# Patient Record
Sex: Male | Born: 1935 | Race: White | Hispanic: No | Marital: Married | Smoking: Former smoker
Health system: Southern US, Community
[De-identification: ages and names within clinical notes are randomized; demographics above are authoritative.]

## PROBLEM LIST (undated history)

## (undated) DIAGNOSIS — T7840XA Allergy, unspecified, initial encounter: Secondary | ICD-10-CM

## (undated) DIAGNOSIS — I700 Atherosclerosis of aorta: Secondary | ICD-10-CM

## (undated) DIAGNOSIS — J90 Pleural effusion, not elsewhere classified: Secondary | ICD-10-CM

## (undated) DIAGNOSIS — Z923 Personal history of irradiation: Secondary | ICD-10-CM

## (undated) DIAGNOSIS — C801 Malignant (primary) neoplasm, unspecified: Secondary | ICD-10-CM

## (undated) DIAGNOSIS — R739 Hyperglycemia, unspecified: Secondary | ICD-10-CM

## (undated) DIAGNOSIS — T380X5A Adverse effect of glucocorticoids and synthetic analogues, initial encounter: Secondary | ICD-10-CM

## (undated) DIAGNOSIS — I313 Pericardial effusion (noninflammatory): Secondary | ICD-10-CM

## (undated) DIAGNOSIS — J449 Chronic obstructive pulmonary disease, unspecified: Secondary | ICD-10-CM

## (undated) HISTORY — DX: Allergy, unspecified, initial encounter: T78.40XA

## (undated) HISTORY — PX: CHOLECYSTECTOMY: SHX55

## (undated) HISTORY — DX: Malignant (primary) neoplasm, unspecified: C80.1

## (undated) HISTORY — DX: Chronic obstructive pulmonary disease, unspecified: J44.9

---

## 1998-12-15 ENCOUNTER — Ambulatory Visit (HOSPITAL_COMMUNITY): Admission: RE | Admit: 1998-12-15 | Discharge: 1998-12-16 | Payer: Self-pay | Admitting: Cardiology

## 2005-11-19 ENCOUNTER — Ambulatory Visit (HOSPITAL_COMMUNITY): Admission: RE | Admit: 2005-11-19 | Discharge: 2005-11-19 | Payer: Self-pay | Admitting: Family Medicine

## 2005-11-20 ENCOUNTER — Encounter (INDEPENDENT_AMBULATORY_CARE_PROVIDER_SITE_OTHER): Payer: Self-pay | Admitting: *Deleted

## 2005-11-20 ENCOUNTER — Ambulatory Visit (HOSPITAL_COMMUNITY): Admission: EM | Admit: 2005-11-20 | Discharge: 2005-11-21 | Payer: Self-pay | Admitting: Emergency Medicine

## 2007-01-20 ENCOUNTER — Encounter: Admission: RE | Admit: 2007-01-20 | Discharge: 2007-01-20 | Payer: Self-pay | Admitting: General Surgery

## 2007-01-31 ENCOUNTER — Encounter (INDEPENDENT_AMBULATORY_CARE_PROVIDER_SITE_OTHER): Payer: Self-pay | Admitting: General Surgery

## 2007-02-01 ENCOUNTER — Inpatient Hospital Stay (HOSPITAL_COMMUNITY): Admission: AD | Admit: 2007-02-01 | Discharge: 2007-02-02 | Payer: Self-pay | Admitting: General Surgery

## 2007-06-11 ENCOUNTER — Inpatient Hospital Stay (HOSPITAL_COMMUNITY): Admission: AD | Admit: 2007-06-11 | Discharge: 2007-06-13 | Payer: Self-pay | Admitting: Internal Medicine

## 2007-06-11 ENCOUNTER — Telehealth: Payer: Self-pay

## 2007-06-17 ENCOUNTER — Ambulatory Visit: Payer: Self-pay | Admitting: Internal Medicine

## 2007-07-02 ENCOUNTER — Ambulatory Visit: Payer: Self-pay | Admitting: Internal Medicine

## 2007-07-02 DIAGNOSIS — K8050 Calculus of bile duct without cholangitis or cholecystitis without obstruction: Secondary | ICD-10-CM | POA: Insufficient documentation

## 2007-07-02 LAB — CONVERTED CEMR LAB
AST: 24 units/L (ref 0–37)
Albumin: 3.6 g/dL (ref 3.5–5.2)
Alkaline Phosphatase: 200 units/L — ABNORMAL HIGH (ref 39–117)
Total Protein: 7.3 g/dL (ref 6.0–8.3)

## 2010-05-30 NOTE — H&P (Signed)
NAME:  [name], [name] NO.:  [id number]   MEDICAL RECORD NO.:  [id number]          PATIENT TYPE:  INP   LOCATION:  5153                         FACILITY:  [facility]   PHYSICIAN:  Herbie Saxon, MDDATE OF BIRTH:  07-11-1933   DATE OF ADMISSION:  06/11/2007  DATE OF DISCHARGE:                              HISTORY & PHYSICAL   PRIMARY CARE PHYSICIAN:  Ernestina Penna, M.D.   Acute power of attorney is his wife [name], phone number [phone]. He is a Do Not Resuscitate if futile.   PRESENTING COMPLAINT:  Dark urine 1 week, fever one episode 6 weeks ago,  jaundice 3 days.   HISTORY OF PRESENTING COMPLAINT:  This is a 77 year old male recently  treated for herpes simplex virus 1 and impetigo, who had noticed dark  urine for the last 1 week.  He also had an episode of fever 6 days ago,  started on Bactrim and Valtrex by the primary care physician.  noticed  increase in jaundice in the last 3 days.  No pruritus.  He has nausea.  No vomiting, no hematemesis, no melena.  He has some epigastric and  right upper quadrant abdominal pain, 4-5/10, radiating up to the chest.  No dysuria, hematuria.  No frequency of urination.  No skin rash or  joint swelling.  The patient had a CT of the abdomen that was performed  on Jun 10, 2007, which showed common bile duct stone.  GTT was elevated  at 939, AST and ALT are elevated.   PAST MEDICAL HISTORY:  Nil of note.   PAST SURGICAL HISTORY:  1. Cholecystectomy in 2007.  2. Exploratory laparotomy in January 2008, with removal of gallstone.   He generally has been previously in good health.   MEDICATION LIST:  Nil except for the recently prescribed Valtrex and  Bactrim.   ALLERGIES:  No known drug allergies.   FAMILY HISTORY:  Nil of note.   REVIEW OF SYSTEMS:  All 14 systems are reviewed.  Pertinent positive are  in history of presenting complaint.   PHYSICAL EXAMINATION:  GENERAL:  He is an elderly man not in  acute  distress.  Deeply jaundiced.  VITAL SIGNS:  Temperature is 98, pulse is 72, respiratory rate is 22,  blood pressure 140/80.  NECK: Supple.  No thyromegaly.  No carotid bruits.  No elevated JVD.  EXTREMITIES:  There is no clubbing or cyanosis.  Oropharynx and nasopharynx are clear.  He has  vesicular lesion on his lips.  No submandibular lymphadenopathy.  CHEST:  Essentially clear.  HEART:  Heart sounds S1 and S2.  Regular rate and rhythm.  No murmur.  ABDOMEN:  He has midline scar.  Minimal tenderness of the epigastrium.  No organomegaly palpable.Inguinal orifices are patent.  NEUROLOGIC:  Cranial nerves II-XII are grossly normal.  Power is 5 .   Labs done from the office showed on Jun 06, 2007, the potassium was 3.3,  chloride 99, bicarbonate 24, creatinine of 0.5, BUN is 16.  Total  cholesterol 185, triglycerides 54, HDL 48, LDL  cholesterol 126.  GTT was  939,  the AST was elevated.  Alkaline phosphatase was 152.  Sodium was  133.  Hepatitis B, A, C were negative.  AST 106, ALT was 50.  Direct  bilirubin 5.7.   ASSESSMENT:  1. Obstructive jaundice,  2. Common bile duct stone.  3. Acute on chronic hepatitis.  4. Hyponatremia.  5. Hypokalemia.  6. Recently treated for impetigo and herpes simplex virus 1.  The patient is admitted to medical surgical bed, GI evaluation by Dr.  Allyson Sabal.   ACTIVITY:  Bedrest.   IV FLUIDS:  D-5 half-normal saline at 40 mL an hour, n.p.o. for  midnight, possible ERCP in the a.m.  Oxygen 2-3 L  keeps sats greater than 90%; vital signs every shift, send  labs, CBC, CMET, chest x-ray, EKG, ammonia level, antibody, total  protein, ESR, in the morning.  SCDs for DVT prophylaxis, protonix 40mg   IV daily, Phenergan 12.5 mg IV q.8 h. p.r.n., Dilaudid 0.5 to 1 mg IV  q.4-6 h. P.r.n.,  Duonebs 1 unit dose q.6 h. p.r.n. for shortness of  breath, Senokot nightly p.r.n. for constipation.   RECOMMENDATIONS:  Review of patient's labs and address further   recommendations accordingly.  The patient's care plan, medication and  treatment plan explained to him and he verbalized understanding.   Encounter time is 45 minutes.      Herbie Saxon, MD  Electronically Signed     MIO/MEDQ  D:  06/11/2007  T:  06/12/2007  Job:  [id number]

## 2010-05-30 NOTE — Op Note (Signed)
NAME:  [name], [name]                  ACCOUNT NO.:  [id number]   MEDICAL RECORD NO.:  [id number]          PATIENT TYPE:  AMB   LOCATION:  DAY                          FACILITY:  [facility]   PHYSICIAN:  Timothy E. Earlene Plater, M.D. DATE OF BIRTH:  [birth year]   DATE OF PROCEDURE:  01/31/2007  DATE OF DISCHARGE:                               OPERATIVE REPORT   PREOPERATIVE DIAGNOSES:  1. Persistent umbilical fistula.  2. Question abdominal abscess.   POSTOPERATIVE DIAGNOSIS:  Gallstone-infested omental abscess with  umbilical fistula.   OPERATIVE PROCEDURES:  1. Exploratory laparotomy.  2. Debridement of fistula of umbilicus.  3. Excision of omental mass.   SURGEON:  Timothy E. Earlene Plater, M.D.   ANESTHESIA:  General.   [name] is 26, otherwise healthy.  He had an emergency laparoscopic  cholecystectomy in November 2007 and since that time has had  periumbilical pain and began draining an umbilical fistula 1 year ago.  This been treated variously and I saw the patient last month in  consultation and proceeded to work this up.  A CT scan was done showing  an infectious mass beneath the umbilicus on the fascia in the abdominal  cavity against the fascia, and so I have explained this to him and his  wife in detail and they have elected to proceed with surgery at this  time, which I have explained as best I can, including the very real  likelihood of loss of the umbilicus.  The patient was seen, identified,  and the permit signed.   He was taken to the operating room and placed supine.  General  endotracheal anesthesia was administered.  The abdomen was opened in the  lower midline from the umbilicus down.  I used magnification throughout.  There was a chronic granulomatous tissue around the umbilicus but not in  the fascia.  I opened the fascia and entered the abdominal cavity.  It  was clean without evidence of any problem whatsoever.  However, by  palpation there was a mass adherent to the  upper left periumbilical  area, so I extended the incision around the umbilical indentation to the  superior abdomen.  Total length was about 6-7 inches.  In and around the  umbilicus was a granulomatous fistula tract through the depth of the  umbilical skin through the fascia and into this omental mass.  I  explored carefully.  I could find no evidence that there was bowel  within the mass and so I bluntly dissected the omental mass off the  anterior abdominal wall.  There were several gallstones seen and  removed.  The omental mass was carefully inspected and again no bowel  was involved, so I excised the omental mass and removed it from the  abdomen.  I cut it on the back table.  There were a number of gallstones  within a chronic granulomatous cavity.  I called and talked to the  pathologist and explained in detail what the specimen was.  The second  specimen was a cup of loose gallstones, and third specimen was  surrounding inflammatory  tissue.  The abdomen was irrigated.  It was  clear.  I could see nor palpate no other abnormalities.  I saw no other  evidence for infectious process.  A close the fascia with 1-0 0 PDS and  interrupted 0 Prolene sutures.  The periumbilical skin was debrided and  the incised skin was closed with staples, and around the umbilicus the  skin was closed with nylon sutures.  However, the umbilicus could not be  recreated and I left the depths of the wound open as it certainly had  been infected previously.  All counts were correct.  He had tolerated it  well.  A culture report done at the beginning of the case returned as,  No organisms, many white cells.  The patient will be observed  overnight and followed as an outpatient.      Timothy E. Earlene Plater, M.D.  Electronically Signed     TED/MEDQ  D:  01/31/2007  T:  01/31/2007  Job:  [id number]   cc:   Ardeth Sportsman, MD  [address] [place] [address]

## 2010-05-30 NOTE — Op Note (Signed)
NAME:  [name], [name] NO.:  [id number]   MEDICAL RECORD NO.:  [id number]          PATIENT TYPE:  INP   LOCATION:  5153                         FACILITY:  [facility]   PHYSICIAN:  Wilhemina Bonito. Marina Goodell, MD      DATE OF BIRTH:  1933/05/20   DATE OF PROCEDURE:  06/12/2007  DATE OF DISCHARGE:                               OPERATIVE REPORT   PROCEDURE:  Endoscopic retrograde cholangiopancreatography with biliary  sphincterotomy and common bile duct stone extraction.   INDICATION:  Obstructive jaundice.   HISTORY:  This is a 77 year old white male with a history of  laparoscopic cholecystectomy in November of 2007, for acute  cholecystitis.  At the time of the procedure, the intraoperative  cholangiogram was negative.  Gallstones were inadvertently dropped into  the abdominal cavity, though felt to be retrieved.  The patient  subsequently developed an omental abscess infested with gallstone and  fistulizing to the umbilicus.  This was dealt with surgically.  He now  presents with a 2-week history of vague abdominal discomfort and  jaundice.   LABORATORY DATA:  Laboratories reveal elevated alkaline phosphatase and  bilirubin.  CT scan, done at [facility], revealed biliary  dilation and a piston-shaped defect felt to be in the distal common  duct.  Also, large periampullary diverticulum.  The patient is referred  for ERCP.  The nature of the procedure as well as the risks, benefits,  and alternatives have been reviewed.  He understood and agreed to  proceed.   PHYSICAL EXAMINATION:  GENERAL:  Jaundiced, otherwise well-appearing  male in no acute distress.  He is alert and oriented.  VITAL SIGNS:  Stable.  HEENT:  Sclerae are icteric.  The oral mucosa reveals crusted fever  blisters on the lower lip.  LUNGS:  Clear.  HEART:  Regular.  ABDOMEN:  Soft with minimal tenderness in the right upper quadrant to  palpation.  Good bowel sounds heard.  EXTREMITIES:  Without  edema.   DESCRIPTION OF PROCEDURE:  After informed consent was obtained, the  patient was sedated with 100 mcg of fentanyl and 10 mg of Versed over  the course of the procedure.  Glucagon was given in an increment of 0.5  mg IV as a duodenal relaxant.  Preoperative Unasyn provided.  The Pentax  side-viewing endoscope was then passed blindly into the esophagus.  Stomach and duodenal bulb were unremarkable.  Postbulbar duodenum was  remarkable for a large periampullary diverticulum.  1. X-ray findings, scout radiograph of the abdomen with the endoscope      in position revealed cholecystectomy clips.  2. Initial injection of contrast via the major ampulla yielded partial      pancreatogram.  Pancreas was purposely not overfilled.  No      stricture or dilation.  The cannula was then redirected with deep      cannulation into the common bile duct.  Complete filling of the      bile duct revealed dilated biliary tree post cholecystectomy.      There was a 7 x 15 mm piston-shaped  defect, which was mobile in the      midportion of the duct.  This was consistent with stone.  3. Therapy with the hydrophilic guidewire in the proximal biliary      tree.  A large biliary sphincterotomy was made with cutting via the      ERBE system.  Cutting was in the 12 o'clock orientation.  The      cutting catheter was then exchanged for a 12/15 mm sequential      balloon.  The stone was easily delivered into the duodenum with the      balloon.  Post extraction occlusion cholangiogram demonstrated no      residual filling defects.  Drainage was excellent.   IMPRESSION:  Choledocholithiasis, status post endoscopic retrograde  cholangiopancreatography with biliary sphincterotomy and stone  extraction.   RECOMMENDATIONS:  1. Standard postprocedure observation.  2. Continue antibiotics overnight.  3. Anticipated discharge home in the morning.      Wilhemina Bonito. Marina Goodell, MD  Electronically Signed     JNP/MEDQ   D:  06/12/2007  T:  06/13/2007  Job:  [id number]   cc:   Ernestina Penna, M.D.  Fax: [phone]   Ardeth Sportsman, MD  [address] [place] [address]   Adolph Pollack, M.D.  [address]  [place]  [place] [address]

## 2010-06-02 NOTE — Op Note (Signed)
NAMEGILDO, CRISCO NO.:  [id number]   MEDICAL RECORD NO.:  [id number]          PATIENT TYPE:  INP   LOCATION:  0098                         FACILITY:  [facility]   PHYSICIAN:  Ardeth Sportsman, MD     DATE OF BIRTH:  1934/01/14   DATE OF PROCEDURE:  11/20/2005  DATE OF DISCHARGE:  11/21/2005                                 OPERATIVE REPORT   PRIMARY CARE PHYSICIAN:  Ernestina Penna, M.D.   SURGEON:  Ardeth Sportsman, MD   PREOPERATIVE DIAGNOSIS:  Acute cholecystitis.   POSTOPERATIVE DIAGNOSIS:  Acute cholecystitis.   PROCEDURE:  Laparoscopic cholecystectomy.   ANESTHESIA:  1. General anesthesia.  2. Local anesthetic and a field block around all port sites.   SPECIMENS:  Gallbladder.   DRAINS:  None.   ESTIMATED BLOOD LOSS:  50 mL   COMPLICATIONS:  None apparent.   INDICATIONS:  Mr. Bury is a 77 year old gentleman who had a 72-hour history  of postprandial abdominal pain with physical exam and CT scan findings  considered for acute cholecystitis.  A recommendation was made for a  laparoscopic cholecystectomy with intraoperative cholangiogram.  The anatomy  and physiology of gallbladder, pancreatic function, and hepatobiliary  function was discussed.  The pathophysiology, cholecystitis, gallstone  pancreatitis, and other risks were discussed.  Recommendation was made for  laparoscopic cholecystectomy.  Risks such as stroke, heart attack, deep  thrombosis pulmonary embolism, and death were discussed.  Risks such as  bleeding, need for transfusion, wound infection, abscess, injury to other  organs were discussed as well.  Because of his persistent pain, and his  elevated white count from last night, and an abnormal CT scan; I recommended  that this be done now and not on an elective basis.  He and his wife agreed  to proceed.   OPERATIVE FINDINGS:  He had a swollen gallbladder consistent with acute  cholecystitis.  He had some mild effusions from his  omentum to the liver and  gallbladder, but no other abnormal pathology.   DESCRIPTION OF PROCEDURE:  An informed consent was confirmed.  The patient  underwent general anesthesia without difficulty.  He was already on IV  antibiotics.  His abdomen was scrubbed and draped in a sterile fashion.  Entry was gained to the abdomen by using a Veress technique.  A 5-mm  incision was made infraumbilically and a towel clamp was used to grab the  fascia for countertraction as the Veress passed easily.  Capnoperitoneum to  15 mmHg provided good abdominal insufflation.  A 5-mm port was placed.  Camera inspection revealed no intra-abdominal injury and findings noted  above.  Under direct visualization a 10-mm port was placed in the subxiphoid  and 5-mm ports times two were placed in the right midabdomen.   The gallbladder wall was very thick, but the fundus could be just slightly  grasped.  The gallbladder was elevated cephalad.  In trying to grab the  infundibulum, the gallbladder wall shredded; and, there was spillage of bile  and some stones. The bile was aspirated.  Stones were  picked up using a  stone grasper and removed.  Some irrigation was done to help clear it out.  The anteromedial and posterolateral peritoneal attachments to the  gallbladder were freed up.  Circumferential dissection was done on the lower  half of the gallbladder.  There was some pulsatile structure going from the  gallbladder down to the porta hepatis consistent with a cystic artery.  There was a lot of fat and swelling around this and it was skeletonized  down. I found two branches.  One clip on the gallbladder side and 2 clips  located proximally were placed on each structure, and these were transected.  Further skeletonization was done to note one structure going from the  infundibulum of the gallbladder down to the porta hepatitis consistent with  the cystic duct.  Two clamps proximally were made.  The cystic duct was   partially transected.  A 5 French catheter was placed in the abdomen,  flushed, and then placed into the cystic duct with a gentle Olsen clamp.   A cholangiogram was performed using a dilated contrast with a nice good run.  The contrast filled up into the right and left intrahepatic ducts and flowed  down into the duodenum with no evidence of any obstruction or stones.  This  was consistent with a normal cholangiogram.  The cholangiocatheter was  removed.   Three clips on the cystic duct were made just slightly proximal to the  cystic duct transection and to the cystic duct cut and the cystic duct cut  was completely transected.  The gallbladder was freed off from its remaining  attachments to the liver.  There was some oozing on the liver side, but this  was able to be controlled with careful focus cautery.  The gallbladder was  placed in an EndoCatch bag; given the contamination and thickened wall, and  removed at the subxiphoid port with some gentle dilation.  A #0 Vicryl was  passed around the subxiphoid defect using a laparoscopic suture passer.   The 10-mm port was removed.  Copious irrigation of 2 liters was done until  there was nice clear return.  The clips were inspected and were attached to  the cystic artery and duct stumps.  There is no leak of bile or blood.  The  liver bed was, again, reinspected and it was nice and quite with no evidence  of any bleeding.  The ports were removed under direct visualization. The  capnoperitoneum was then evacuated.  The subxiphoid port fascial stitch was  tied down.  The ports were closed using 4-0 Monocryl stitch and sterile  dressings were applied.  The patient was extubated and sent to the recovery  room in stable condition.   I explained the operative findings to the patient's wife.  The expected  postoperative recovery was discussed in detail. She expressed understanding  and appreciation.      Ardeth Sportsman, MD  Electronically  Signed     SCG/MEDQ  D:  11/20/2005  T:  11/21/2005  Job:  [id number]

## 2010-06-02 NOTE — Consult Note (Signed)
NAME:  TRAYLEN, ECKELS NO.:  [id number]   MEDICAL RECORD NO.:  [id number]          PATIENT TYPE:  INP   LOCATION:  0103                         FACILITY:  [facility]   PHYSICIAN:  Ardeth Sportsman, MD     DATE OF BIRTH:  [birth year]   DATE OF CONSULTATION:  DATE OF DISCHARGE:                                   CONSULTATION   REQUESTING PHYSICIAN:  Ernestina Penna, M.D.   CHIEF COMPLAINT:  Abdominal pain, probable acute cholecystitis.   HISTORY OF PRESENT ILLNESS:  Mr. Viegas is a 77 year old male who is  otherwise very physically active with no significant past medical history  who has had for the past couple of months some mild anorexia and nausea.  Three days ago he had an episode of abdominal pain that woke him up in the  middle of the night.  He took some Pepto-Bismol and eventually the nausea  and pain faded away.  However, yesterday in the afternoon after having 3  bowels of vegetable soup, he had severe abdominal pain described as in the  middle, crampy.  It did not appear to radiate at all.  He got so nauseated  he actually vomited.  He was evaluated by his primary care physician who had  some testing done at [facility] with findings concerning for  cholecystitis.  He went home, and he has avoided any solid food.  He has  tolerated liquids and just water primarily.  The pain has gone down somewhat  but it has not completely gone away.  Based on his concern, Dr. Christell Constant called  Sandria Bales. Ezzard Standing, M.D. of our group for evaluation.  I was available and so I  am seeing the patient for the group.   The patient any history of heartburn or reflux.  He normally has about 2  bowel movements a day which is normal for him.  No significant sick contacts  or travel history.  He has had a negative colonoscopy about 5 years ago.  No  history of any other GI complaints.   PAST MEDICAL HISTORY:  He had a negative cardiac catheterization done in  2002.  He did not seem to  have any particular symptoms, and it was pretty  good overall.   PAST SURGICAL HISTORY:  Negative.   MEDICATIONS:  He takes aspirin intermittently, maybe once or twice a week.  The last one he took was about 3 days ago.  It is 81 mg a day.   ALLERGIES:  NO KNOWN DRUG ALLERGIES.   SOCIAL HISTORY:  No tobacco, alcohol, or other drug use.  He is here today  with his wife.   FAMILY HISTORY:  He can not recall any significant cardiopulmonary or GI  history in his family.   PHYSICAL EXAMINATION:  VITAL SIGNS:  Temperature is 97.1, pulse 85 and  regular, respirations 18, blood pressure 117/73.  His weight is about 82 kg.  GENERAL:  He is alert and oriented x4 and not in any acute distress at this  time.  PSYCHIATRIC:  He is  pleasant and interactive with no evidence of dementia,  delerium, psychosis, or paranoia.  NEUROLOGIC:  Cranial nerves II-XII are intact.  Hand grip 5/5 equal and  symmetrical.  No resting or intention tremor.  Appears to have a normal  gait.  EYES:  Show pupils equal, round, reactive to light.  Extraocular movements  are intact.  His sclerae are not icteric or injected.  HEENT:  He is normocephalic.  No facial asymmetry.  Mucous membranes are dry  but his naso & oropharynx are clear.  NECK:  Supple without any adenopathy.  Trachea is midline.  HEART:  Regular rate and rhythm.  No murmurs, clicks, rubs.  No carotid  bruits.  Normal radial and dorsalis pedis pulses.  LUNGS:  Clear to auscultation bilaterally.  No wheezes, rales, or rhonchi.  No pain on vigorous sternal percussion.  ABDOMEN:  He does have tenderness in his right upper quadrant.  I do not  know if I would call is a class A Murphy sign but he is definitely tender  and does splint a little bit to deep palpation.  The rest of the abdomen is  soft with no diffuse peritonitis, no umbilical hernia.  GENITAL:  Normal external male genitalia with testes descended bilaterally.  No inguinal hernias.  RECTAL:   Deferred.  EXTREMITIES:  No clubbing, cyanosis, or edema.  MUSCULOSKELETAL:  Full range of motion shoulder, elbows, wrists, both hips,  knees, ankles.  LYMPH:  No head, neck, axilla or groin lymphadenopathy.   STUDIES:  His white blood count was 12.6 last night.  His EKG was negative  at the office.  No ST-T changes.  His chemistries are all normal range  including normal bilirubin and liver function tests.  Amylase and lipase are  pending.  He did have a CT scan done which I did review with Dr. Jean Rosenthal  here at the ER in the radiology department here, which does show gallbladder  wall thickening with edema and an obvious impacted stone concerning for  cholecystitis.  He does not have any free air or free fluid.   ASSESSMENT/PLAN:  A 76 year old gentleman with classic story of acute  cholecystitis with persistent pain and an elevated white count and CT scan  findings also concerning for this as well.   The anatomy and physiology of hepatobiliary and pancreatic function was  discussed.  Pathophysiology of cholecystitis was explained and  recommendation was made for laparoscopic cholecystectomy with cholangiogram.  Risk such as stroke, heart attack, DVT, pulmonary embolism, and death were  discussed.  Risks such as bleeding, need for transfusion, wound infection,  abscess, injury to other organs, bile duct injury, need for ERCP, other  risks were discussed.  Differential diagnoses were explained but I think  given the fact he has not had any significant GI symptoms or other issues, I  think it is most likely that he has cholecystitis.  He is feeling a little  better but he has had two attacks within 72 hours and he has only tried  liquids and I am a little hesitant to let him go home.  He and his wife have  agreed to proceed.      Ardeth Sportsman, MD  Electronically Signed     SCG/MEDQ  D:  11/20/2005  T:  11/20/2005  Job:  [id number]

## 2010-10-05 LAB — CULTURE, ROUTINE-ABSCESS

## 2010-10-05 LAB — BASIC METABOLIC PANEL
BUN: 11
CO2: 32
Calcium: 9
Chloride: 98
GFR calc Af Amer: >60
GFR calc non Af Amer: >60
Potassium: 4.3

## 2010-10-05 LAB — CBC
MCHC: 35.1
MCV: 91.3
Platelets: 191
Platelets: 203
RDW: 13.6

## 2010-10-05 LAB — ANAEROBIC CULTURE

## 2010-10-11 LAB — AMMONIA: Ammonia: 22

## 2010-10-11 LAB — COMPREHENSIVE METABOLIC PANEL
ALT: 180 — ABNORMAL HIGH
Albumin: 3.4 — ABNORMAL LOW
Alkaline Phosphatase: 603 — ABNORMAL HIGH
BUN: 10
CO2: 27
Calcium: 8.8
Chloride: 104
Creatinine, Ser: 0.76
Creatinine, Ser: 0.8
GFR calc Af Amer: >60
GFR calc non Af Amer: >60
GFR calc non Af Amer: >60
Glucose, Bld: 119 — ABNORMAL HIGH
Potassium: 3.6
Total Bilirubin: 3.6 — ABNORMAL HIGH
Total Protein: 7.3

## 2010-10-11 LAB — CBC
HCT: 33.4 — ABNORMAL LOW
Hemoglobin: 11.8 — ABNORMAL LOW
MCHC: 34.5
MCV: 93.2
Platelets: 242
RBC: 3.97 — ABNORMAL LOW
RDW: 14.7
WBC: 7.1

## 2010-10-11 LAB — LIPID PANEL
Cholesterol: 206 — ABNORMAL HIGH
LDL Cholesterol: 157 — ABNORMAL HIGH
Triglycerides: 110

## 2010-10-11 LAB — URINALYSIS, ROUTINE W REFLEX MICROSCOPIC
Bilirubin Urine: NEGATIVE
Glucose, UA: NEGATIVE
Ketones, ur: NEGATIVE
Nitrite: NEGATIVE
Protein, ur: NEGATIVE
Specific Gravity, Urine: 1.005
Urobilinogen, UA: 0.2

## 2010-10-11 LAB — PROTIME-INR: Prothrombin Time: 12.2

## 2010-10-11 LAB — ANA: Anti Nuclear Antibody(ANA): NEGATIVE

## 2010-10-11 LAB — APTT: aPTT: 30

## 2012-09-08 ENCOUNTER — Ambulatory Visit (INDEPENDENT_AMBULATORY_CARE_PROVIDER_SITE_OTHER): Payer: Medicare Other | Admitting: Family Medicine

## 2012-09-08 ENCOUNTER — Ambulatory Visit (INDEPENDENT_AMBULATORY_CARE_PROVIDER_SITE_OTHER): Payer: Medicare Other

## 2012-09-08 ENCOUNTER — Encounter: Payer: Self-pay | Admitting: Family Medicine

## 2012-09-08 VITALS — BP 148/76 | HR 64 | Temp 97.0°F | Ht 72.0 in | Wt 168.0 lb

## 2012-09-08 DIAGNOSIS — R042 Hemoptysis: Secondary | ICD-10-CM

## 2012-09-08 DIAGNOSIS — E785 Hyperlipidemia, unspecified: Secondary | ICD-10-CM

## 2012-09-08 LAB — POCT CBC
Granulocyte percent: 73.9 %G (ref 37–80)
HCT, POC: 38.9 % — AB (ref 43.5–53.7)
Hemoglobin: 13.1 g/dL — AB (ref 14.1–18.1)
Lymph, poc: 1.3 (ref 0.6–3.4)
MCH, POC: 29.9 pg (ref 27–31.2)
MCHC: 33.6 g/dL (ref 31.8–35.4)
MCV: 89 fL (ref 80–97)
MPV: 6.1 fL (ref 0–99.8)
POC Granulocyte: 5 (ref 2–6.9)
POC LYMPH PERCENT: 19.6 %L (ref 10–50)
Platelet Count, POC: 207 10*3/uL (ref 142–424)
RBC: 4.4 M/uL — AB (ref 4.69–6.13)
RDW, POC: 13.4 %
WBC: 6.7 10*3/uL (ref 4.6–10.2)

## 2012-09-08 MED ORDER — SIMVASTATIN 10 MG PO TABS: 10.0000 mg | ORAL_TABLET | Freq: Every day | ORAL | Status: DC

## 2012-09-08 MED ORDER — AZITHROMYCIN 250 MG PO TABS: ORAL_TABLET | ORAL | Status: DC

## 2012-09-08 NOTE — Patient Instructions (Signed)
Hemoptysis  Hemoptysis, which means coughing up blood, can be a sign of a minor problem or a serious medical condition. The blood that is coughed up may come from the lungs and airways. Coughed-up blood can also come from bleeding that occurs outside the lungs and airways. Blood can drain into the windpipe during a severe nosebleed or when blood is vomited from the stomach. Because hemoptysis can be a sign of something serious, a medical evaluation is required. For some people with hemoptysis, no definite cause is ever identified.  CAUSES   The most common cause of hemoptysis is bronchitis. Some other common causes include:   · A ruptured blood vessel caused by coughing or an infection.    · A medical condition that causes damage to the large air passageways (bronchiectasis).    · A blood clot in the lungs (pulmonary embolism).    · Pneumonia.    · Tuberculosis.    · Breathing in a small foreign object.    · Cancer.  For some people with hemoptysis, no definite cause is ever identified.    HOME CARE INSTRUCTIONS  · Only take over-the-counter or prescription medicines as directed by your caregiver. Do not use cough suppressants unless your caregiver approves.  · If your caregiver prescribes antibiotic medicines, take them as directed. Finish them even if you start to feel better.  · Do not smoke. Also avoid secondhand smoke.  · Follow up with your caregiver as directed.  SEEK IMMEDIATE MEDICAL CARE IF:   · You cough up bloody mucus for longer than a week.  · You have a blood-producing cough that is severe or getting worse.  · You have a blood-producing cough that comes and goes over time.  · You develop problems with your breathing.    · You vomit blood.  · You develop bloody or black-colored stools.  · You have chest pain.    · You develop night sweats.  · You feel faint or pass out.    · You have a fever or persistent symptoms for more than 2 3 days.    · You have a fever and your symptoms suddenly get worse.  MAKE  SURE YOU:  · Understand these instructions.  · Will watch your condition.  · Will get help right away if you are not doing well or get worse.  Document Released: 03/12/2001 Document Revised: 12/19/2011 Document Reviewed: 10/19/2011  [employer]® Patient Information ©2014 [employer].

## 2012-09-08 NOTE — Progress Notes (Signed)
  Subjective:    Patient ID: [name], male    DOB: 11/20/1933, 79 y.o.   MRN: [id number]  HPI This 79 y.o. male presents for evaluation of hemoptysis for the last few days. He states he has been coughing up some blood and is not sure if it is draining from His sinuses or from the back of his throat.  He states a few times a day he is coughing Up blood.  He denies fever or weight loss.   Review of Systems C/o hemoptysis No chest pain, SOB, HA, dizziness, vision change, N/V, diarrhea, constipation, dysuria, urinary urgency or frequency, myalgias, arthralgias or rash.     Objective:   Physical Exam Vital signs noted  Well developed well nourished male.  HEENT - Head atraumatic Normocephalic                Eyes - PERRLA, Conjuctiva - clear Sclera- Clear EOMI                Ears - EAC's Wnl TM's Wnl Gross Hearing WNL                Nose - Nares patent                 Throat - oropharanx wnl Respiratory - Lungs CTA bilateral Cardiac - RRR S1 and S2 without murmur GI - Abdomen soft Nontender and bowel sounds active x 4 Extremities - No edema. Neuro - Grossly intact.   CXR - Preliminary read with consolidation right perihilar area possibly and COPD.    Assessment & Plan:  Hemoptysis, unspecified - Plan: POCT CBC, TSH, CMP14+EGFR, DG Chest 2 View, azithromycin (ZITHROMAX) 250 MG tablet, Sedimentation rate  Other and unspecified hyperlipidemia - Plan: simvastatin (ZOCOR) 10 MG tablet, Lipid panel, Sedimentation rate  Follow up in 3 days.

## 2012-09-09 LAB — LIPID PANEL
Chol/HDL Ratio: 3.2 ratio units (ref 0.0–5.0)
Cholesterol, Total: 125 mg/dL (ref 100–199)
HDL: 39 mg/dL — ABNORMAL LOW (ref >39)
LDL Calculated: 68 mg/dL (ref 0–99)
Triglycerides: 89 mg/dL (ref 0–149)
VLDL Cholesterol Cal: 18 mg/dL (ref 5–40)

## 2012-09-09 LAB — CMP14+EGFR
ALT: 16 IU/L (ref 0–44)
AST: 20 IU/L (ref 0–40)
Albumin/Globulin Ratio: 1.6 (ref 1.1–2.5)
Albumin: 4.2 g/dL (ref 3.5–4.8)
Alkaline Phosphatase: 126 IU/L — ABNORMAL HIGH (ref 39–117)
BUN/Creatinine Ratio: 18 (ref 10–22)
BUN: 16 mg/dL (ref 8–27)
CO2: 25 mmol/L (ref 18–29)
Calcium: 9.1 mg/dL (ref 8.6–10.2)
Chloride: 99 mmol/L (ref 97–108)
Creatinine, Ser: 0.91 mg/dL (ref 0.76–1.27)
GFR calc Af Amer: 94 mL/min/{1.73_m2} (ref >59)
GFR calc non Af Amer: 82 mL/min/{1.73_m2} (ref >59)
Globulin, Total: 2.6 g/dL (ref 1.5–4.5)
Glucose: 105 mg/dL — ABNORMAL HIGH (ref 65–99)
Potassium: 4.1 mmol/L (ref 3.5–5.2)
Sodium: 138 mmol/L (ref 134–144)
Total Bilirubin: 0.6 mg/dL (ref 0.0–1.2)
Total Protein: 6.8 g/dL (ref 6.0–8.5)

## 2012-09-09 LAB — TSH: TSH: 2.2 u[IU]/mL (ref 0.450–4.500)

## 2012-09-09 LAB — SEDIMENTATION RATE: Sed Rate: 9 mm/hr (ref 0–30)

## 2012-09-10 ENCOUNTER — Ambulatory Visit (INDEPENDENT_AMBULATORY_CARE_PROVIDER_SITE_OTHER): Payer: Medicare Other | Admitting: Family Medicine

## 2012-09-10 ENCOUNTER — Encounter: Payer: Self-pay | Admitting: Family Medicine

## 2012-09-10 VITALS — BP 143/72 | HR 60 | Temp 97.0°F | Ht 72.0 in | Wt 167.0 lb

## 2012-09-10 DIAGNOSIS — R042 Hemoptysis: Secondary | ICD-10-CM

## 2012-09-10 NOTE — Patient Instructions (Signed)
Hemoptysis  Hemoptysis, which means coughing up blood, can be a sign of a minor problem or a serious medical condition. The blood that is coughed up may come from the lungs and airways. Coughed-up blood can also come from bleeding that occurs outside the lungs and airways. Blood can drain into the windpipe during a severe nosebleed or when blood is vomited from the stomach. Because hemoptysis can be a sign of something serious, a medical evaluation is required. For some people with hemoptysis, no definite cause is ever identified.  CAUSES   The most common cause of hemoptysis is bronchitis. Some other common causes include:   · A ruptured blood vessel caused by coughing or an infection.    · A medical condition that causes damage to the large air passageways (bronchiectasis).    · A blood clot in the lungs (pulmonary embolism).    · Pneumonia.    · Tuberculosis.    · Breathing in a small foreign object.    · Cancer.  For some people with hemoptysis, no definite cause is ever identified.    HOME CARE INSTRUCTIONS  · Only take over-the-counter or prescription medicines as directed by your caregiver. Do not use cough suppressants unless your caregiver approves.  · If your caregiver prescribes antibiotic medicines, take them as directed. Finish them even if you start to feel better.  · Do not smoke. Also avoid secondhand smoke.  · Follow up with your caregiver as directed.  SEEK IMMEDIATE MEDICAL CARE IF:   · You cough up bloody mucus for longer than a week.  · You have a blood-producing cough that is severe or getting worse.  · You have a blood-producing cough that comes and goes over time.  · You develop problems with your breathing.    · You vomit blood.  · You develop bloody or black-colored stools.  · You have chest pain.    · You develop night sweats.  · You feel faint or pass out.    · You have a fever or persistent symptoms for more than 2 3 days.    · You have a fever and your symptoms suddenly get worse.  MAKE  SURE YOU:  · Understand these instructions.  · Will watch your condition.  · Will get help right away if you are not doing well or get worse.  Document Released: 03/12/2001 Document Revised: 12/19/2011 Document Reviewed: 10/19/2011  [employer]® Patient Information ©2014 [employer].

## 2012-09-10 NOTE — Progress Notes (Signed)
  Subjective:    Patient ID: [name], male    DOB: 1933-10-05, 79 y.o.   MRN: [id number]  HPI This 79 y.o. male presents for evaluation of follow up on his cxr and labs.  He was seen a few days ago For c/o hemoptysis.  He was a former smoker but has been quit for years.  He was tx'd with zpak for  Possible URI.  He states he has not had anymore hemoptysis in the last few days.   Review of Systems No chest pain, SOB, HA, dizziness, vision change, N/V, diarrhea, constipation, dysuria, urinary urgency or frequency, myalgias, arthralgias or rash.     Objective:   Physical Exam Vital signs noted  Well developed well nourished male.  HEENT - Head atraumatic Normocephalic                Throat - oropharanx wnl Respiratory - Lungs CTA bilateral Cardiac - RRR S1 and S2 without murmur        Assessment & Plan:  Hemoptysis This has resolved.  Discussed if this returns then he will need a CT Scan of the chest.  He wants to wait on CT Of chest for now.

## 2012-10-13 ENCOUNTER — Encounter: Payer: Self-pay | Admitting: Family Medicine

## 2012-10-13 ENCOUNTER — Ambulatory Visit (INDEPENDENT_AMBULATORY_CARE_PROVIDER_SITE_OTHER): Payer: Medicare Other

## 2012-10-13 ENCOUNTER — Ambulatory Visit (INDEPENDENT_AMBULATORY_CARE_PROVIDER_SITE_OTHER): Payer: Medicare Other | Admitting: Family Medicine

## 2012-10-13 VITALS — BP 139/78 | HR 72 | Temp 97.6°F | Ht 71.5 in | Wt 167.2 lb

## 2012-10-13 DIAGNOSIS — Z23 Encounter for immunization: Secondary | ICD-10-CM

## 2012-10-13 DIAGNOSIS — E785 Hyperlipidemia, unspecified: Secondary | ICD-10-CM

## 2012-10-13 DIAGNOSIS — J449 Chronic obstructive pulmonary disease, unspecified: Secondary | ICD-10-CM | POA: Insufficient documentation

## 2012-10-13 DIAGNOSIS — R042 Hemoptysis: Secondary | ICD-10-CM

## 2012-10-13 DIAGNOSIS — T7840XA Allergy, unspecified, initial encounter: Secondary | ICD-10-CM | POA: Insufficient documentation

## 2012-10-13 LAB — POCT CBC
Granulocyte percent: 75.7 %G (ref 37–80)
HCT, POC: 41.5 % — AB (ref 43.5–53.7)
Hemoglobin: 13.7 g/dL — AB (ref 14.1–18.1)
Lymph, poc: 1.5 (ref 0.6–3.4)
MCH, POC: 29.4 pg (ref 27–31.2)
MCHC: 32.9 g/dL (ref 31.8–35.4)
MCV: 89.4 fL (ref 80–97)
MPV: 6.1 fL (ref 0–99.8)
POC Granulocyte: 5.8 (ref 2–6.9)
POC LYMPH PERCENT: 19.7 %L (ref 10–50)
Platelet Count, POC: 226 10*3/uL (ref 142–424)
RBC: 4.6 M/uL — AB (ref 4.69–6.13)
RDW, POC: 13.2 %
WBC: 7.6 10*3/uL (ref 4.6–10.2)

## 2012-10-13 LAB — POCT INR: INR: 0.9

## 2012-10-13 NOTE — Progress Notes (Signed)
Patient ID: [name], male   DOB: 1933/10/10, 79 y.o.   MRN: [id number] SUBJECTIVE: CC: Chief Complaint  Patient presents with  . Follow-up    spitting up reed blood onset several weeks. Saw Bill and was prescribed a zpack.     HPI: Hemoptysis, weight loss, and lower abdomen is quesy.nausea,  But not vomiting. Had cholecystectomy 5 to 6 years ago and it was complicated by ductal stones. No fever, no pain, Has diarrhea some. As long as he eats coconut it helps. This has been since cholecystectomy.  With the Zpak still coughing up some blood. It had stopped and now it is coming back  Past Medical History  Diagnosis Date  . COPD (chronic obstructive pulmonary disease)   . Allergy     allergic rhinitis   Past Surgical History  Procedure Laterality Date  . Cholecystectomy     History   Social History  . Marital Status: Married    Spouse Name: N/A    Number of Children: N/A  . Years of Education: N/A   Occupational History  . Not on file.   Social History Main Topics  . Smoking status: Former Smoker    Quit date: 09/11/1998  . Smokeless tobacco: Never Used  . Alcohol Use: No  . Drug Use: No  . Sexual Activity: Not on file   Other Topics Concern  . Not on file   Social History Narrative  . No narrative on file   Family History  Problem Relation Age of Onset  . Cancer Mother     breast  . Stroke Father    Current Outpatient Prescriptions on File Prior to Visit  Medication Sig Dispense Refill  . simvastatin (ZOCOR) 10 MG tablet Take 1 tablet (10 mg total) by mouth at bedtime.  90 tablet  3   No current facility-administered medications on file prior to visit.   Allergies  Allergen Reactions  . Sulfa Antibiotics    Immunization History  Administered Date(s) Administered  . Influenza,inj,Quad PF,36+ Mos 10/13/2012   Prior to Admission medications   Medication Sig Start Date End Date Taking? Authorizing Provider  simvastatin (ZOCOR) 10 MG tablet Take 1  tablet (10 mg total) by mouth at bedtime. 09/08/12  Yes Deatra Canter, FNP     ROS: As above in the HPI. All other systems are stable or negative.  OBJECTIVE: APPEARANCE:  Patient in no acute distress.The patient appeared well nourished and normally developed. Acyanotic. Waist: VITAL SIGNS:BP 139/78  Pulse 72  Temp(Src) 97.6 F (36.4 C) (Oral)  Ht 5' 11.5" (1.816 m)  Wt 167 lb 3.2 oz (75.841 kg)  BMI 23 kg/m2 WM  SKIN: warm and  Dry without overt rashes, tattoos and scars  HEAD and Neck: without JVD, Head and scalp: normal Eyes:No scleral icterus. Fundi normal, eye movements normal. Ears: Auricle normal, canal normal, Tympanic membranes normal, insufflation normal. Nose: normal Throat: normal Neck & thyroid: normal  CHEST & LUNGS: Chest wall: normal Lungs: Clear  CVS: Reveals the PMI to be normally located. Regular rhythm, First and Second Heart sounds are normal,  absence of murmurs, rubs or gallops. Peripheral vasculature: Radial pulses: normal Dorsal pedis pulses: normal Posterior pulses: normal  ABDOMEN:  Appearance: normal Benign, no organomegaly, no masses, no Abdominal Aortic enlargement. No Guarding , no rebound. No Bruits. Bowel sounds: normal  RECTAL: N/A GU: N/A  EXTREMETIES: nonedematous.  MUSCULOSKELETAL:  Spine: normal Joints: intact  NEUROLOGIC: oriented to time,place and person; nonfocal. Strength  is normal Sensory is normal Reflexes are normal Cranial Nerves are normal. Results for orders placed in visit on 10/13/12  POCT CBC      Result Value Range   WBC 7.6  4.6 - 10.2 K/uL   Lymph, poc 1.5  0.6 - 3.4   POC LYMPH PERCENT 19.7  10 - 50 %L   POC Granulocyte 5.8  2 - 6.9   Granulocyte percent 75.7  37 - 80 %G   RBC 4.6 (*) 4.69 - 6.13 M/uL   Hemoglobin 13.7 (*) 14.1 - 18.1 g/dL   HCT, POC 40.9 (*) 81.1 - 53.7 %   MCV 89.4  80 - 97 fL   MCH, POC 29.4  27 - 31.2 pg   MCHC 32.9  31.8 - 35.4 g/dL   RDW, POC 91.4     Platelet  Count, POC 226.0  142 - 424 K/uL   MPV 6.1  0 - 99.8 fL  POCT INR      Result Value Range   INR 0.9      ASSESSMENT: COPD (chronic obstructive pulmonary disease)  Hemoptysis - Plan: DG Chest 2 View, POCT CBC, POCT INR, APTT, CMP14+EGFR, CT Chest W Contrast, CT Soft Tissue Neck W Contrast  Other and unspecified hyperlipidemia  Need for prophylactic vaccination and inoculation against influenza   PLAN: Orders Placed This Encounter  Procedures  . DG Chest 2 View    Standing Status: Future     Number of Occurrences: 1     Standing Expiration Date: 12/13/2013    Order Specific Question:  Reason for Exam (SYMPTOM  OR DIAGNOSIS REQUIRED)    Answer:  reecurrent hemoptysis    Order Specific Question:  Preferred imaging location?    Answer:  Internal  . CT Chest W Contrast    Standing Status: Future     Number of Occurrences:      Standing Expiration Date: 12/13/2013    Order Specific Question:  Reason for Exam (SYMPTOM  OR DIAGNOSIS REQUIRED)    Answer:  ex-smoker with hemoptysis    Order Specific Question:  Preferred imaging location?    Answer:  [facility]  . CT Soft Tissue Neck W Contrast    Standing Status: Future     Number of Occurrences:      Standing Expiration Date: 01/12/2014    Order Specific Question:  Reason for Exam (SYMPTOM  OR DIAGNOSIS REQUIRED)    Answer:  hemoptysis, exsmoker. r/o lesion in the neck    Order Specific Question:  Preferred imaging location?    Answer:  [facility]  . APTT  . CMP14+EGFR  . POCT CBC  . POCT INR    [facility] reading (PRIMARY) by  Dr. Modesto Charon: right hilar fullness. Await official reading, Ct chest ordered.  Discussed with patient and his wife risks for cancer.  Follow up pending scans the next few days.  Results for orders placed in visit on 10/13/12  POCT CBC      Result Value Range   WBC 7.6  4.6 - 10.2 K/uL   Lymph, poc 1.5  0.6 - 3.4   POC LYMPH PERCENT 19.7  10 - 50 %L   POC Granulocyte 5.8  2 - 6.9    Granulocyte percent 75.7  37 - 80 %G   RBC 4.6 (*) 4.69 - 6.13 M/uL   Hemoglobin 13.7 (*) 14.1 - 18.1 g/dL   HCT, POC 78.2 (*) 95.6 - 53.7 %   MCV 89.4  80 - 97 fL   MCH, POC 29.4  27 - 31.2 pg   MCHC 32.9  31.8 - 35.4 g/dL   RDW, POC 65.7     Platelet Count, POC 226.0  142 - 424 K/uL   MPV 6.1  0 - 99.8 fL  POCT INR      Result Value Range   INR 0.9      Return if symptoms worsen or fail to improve, for pending work up.  Jeanmarie Mccowen P. Modesto Charon, M.D.

## 2012-10-14 ENCOUNTER — Ambulatory Visit: Payer: Medicare Other | Admitting: Family Medicine

## 2012-10-14 ENCOUNTER — Encounter (HOSPITAL_COMMUNITY): Payer: Self-pay

## 2012-10-14 ENCOUNTER — Ambulatory Visit (HOSPITAL_COMMUNITY)
Admission: RE | Admit: 2012-10-14 | Discharge: 2012-10-14 | Disposition: A | Discharge status: Home / Self Care | Payer: PRIVATE HEALTH INSURANCE | Source: Ambulatory Visit | Attending: Family Medicine | Admitting: Family Medicine

## 2012-10-14 DIAGNOSIS — R222 Localized swelling, mass and lump, trunk: Secondary | ICD-10-CM | POA: Insufficient documentation

## 2012-10-14 DIAGNOSIS — J4489 Other specified chronic obstructive pulmonary disease: Secondary | ICD-10-CM | POA: Insufficient documentation

## 2012-10-14 DIAGNOSIS — J189 Pneumonia, unspecified organism: Secondary | ICD-10-CM | POA: Insufficient documentation

## 2012-10-14 DIAGNOSIS — J449 Chronic obstructive pulmonary disease, unspecified: Secondary | ICD-10-CM | POA: Insufficient documentation

## 2012-10-14 DIAGNOSIS — R042 Hemoptysis: Secondary | ICD-10-CM

## 2012-10-14 LAB — CMP14+EGFR

## 2012-10-14 LAB — APTT: aPTT: 31 s (ref 24–33)

## 2012-10-14 MED ORDER — IOHEXOL 300 MG/ML  SOLN
80.0000 mL | Freq: Once | INTRAMUSCULAR | Status: AC | PRN
  Administered 2012-10-14: 80 mL via INTRAVENOUS

## 2012-10-16 ENCOUNTER — Other Ambulatory Visit: Payer: Self-pay | Admitting: Family Medicine

## 2012-10-16 DIAGNOSIS — C3491 Malignant neoplasm of unspecified part of right bronchus or lung: Secondary | ICD-10-CM

## 2012-10-16 DIAGNOSIS — J984 Other disorders of lung: Secondary | ICD-10-CM

## 2012-10-16 DIAGNOSIS — J189 Pneumonia, unspecified organism: Secondary | ICD-10-CM

## 2012-10-16 MED ORDER — LEVOFLOXACIN 500 MG PO TABS: 500.0000 mg | ORAL_TABLET | Freq: Every day | ORAL | Status: DC

## 2012-10-22 ENCOUNTER — Institutional Professional Consult (permissible substitution) (INDEPENDENT_AMBULATORY_CARE_PROVIDER_SITE_OTHER): Payer: Medicare Other | Admitting: Surgery

## 2012-10-22 ENCOUNTER — Encounter: Payer: Self-pay | Admitting: Surgery

## 2012-10-22 VITALS — BP 149/88 | HR 92 | Resp 20 | Ht 71.5 in | Wt 167.0 lb

## 2012-10-22 DIAGNOSIS — R590 Localized enlarged lymph nodes: Secondary | ICD-10-CM

## 2012-10-22 DIAGNOSIS — R599 Enlarged lymph nodes, unspecified: Secondary | ICD-10-CM

## 2012-10-22 DIAGNOSIS — J984 Other disorders of lung: Secondary | ICD-10-CM

## 2012-10-23 ENCOUNTER — Other Ambulatory Visit: Payer: Self-pay | Admitting: *Deleted

## 2012-10-23 DIAGNOSIS — R918 Other nonspecific abnormal finding of lung field: Secondary | ICD-10-CM

## 2012-10-23 DIAGNOSIS — J449 Chronic obstructive pulmonary disease, unspecified: Secondary | ICD-10-CM

## 2012-10-26 ENCOUNTER — Encounter: Payer: Self-pay | Admitting: Surgery

## 2012-10-26 DIAGNOSIS — J984 Other disorders of lung: Secondary | ICD-10-CM | POA: Insufficient documentation

## 2012-10-26 NOTE — Progress Notes (Signed)
[address]       [place] [address]             [phone]        PCP is Rudi Heap, MD Referring Provider is Ernestina Penna, MD  Chief Complaint  Patient presents with  . Lung Mass    Surgical eval on 5.9 cavitary mass left lower lobe and 11 mm subcarinal mediastinal lymph node, Chest CT 10/14/12     HPI:  The patient is a 79 year old gentleman with a remote smoking history who presented with a several week history of hemoptysis. He says that this was a small amount of blood and he thought it was coming from his nose. He was treated with an antibiotic but it has not resolved. He had a chest CT scan that shows a 5.9 cm cavitary mass in the superior segment of the right lower lobe contiguous with the right hilum. There is an 11 mm subcarinal lymph node.  Past Medical History  Diagnosis Date  . COPD (chronic obstructive pulmonary disease)   . Allergy     allergic rhinitis    Past Surgical History  Procedure Laterality Date  . Cholecystectomy      Family History  Problem Relation Age of Onset  . Cancer Mother     breast  . Stroke Father     Social History History  Substance Use Topics  . Smoking status: Former Smoker    Quit date: 09/11/1998  . Smokeless tobacco: Never Used  . Alcohol Use: No    Current Outpatient Prescriptions  Medication Sig Dispense Refill  . levofloxacin (LEVAQUIN) 500 MG tablet Take 1 tablet (500 mg total) by mouth daily.  7 tablet  0  . simvastatin (ZOCOR) 10 MG tablet Take 1 tablet (10 mg total) by mouth at bedtime.  90 tablet  3   No current facility-administered medications for this visit.    Allergies  Allergen Reactions  . Sulfa Antibiotics Nausea And Vomiting    Review of Systems  Constitutional: Positive for appetite change and unexpected weight change.  HENT: Negative.   Eyes: Negative.   Respiratory: Negative for cough, chest tightness and shortness of breath.   Cardiovascular: Negative  for chest pain, palpitations and leg swelling.  Gastrointestinal: Positive for nausea.  Endocrine: Negative.   Genitourinary: Negative.   Musculoskeletal: Negative.   Skin: Negative.   Allergic/Immunologic: Negative.   Neurological: Negative.   Hematological: Negative.   Psychiatric/Behavioral: Negative.     BP 149/88  Pulse 92  Resp 20  Ht 5' 11.5" (1.816 m)  Wt 167 lb (75.751 kg)  BMI 22.97 kg/m2  SpO2 97% Physical Exam  Constitutional: He is oriented to person, place, and time. He appears well-developed and well-nourished. No distress.  HENT:  Head: Normocephalic and atraumatic.  Mouth/Throat: Oropharynx is clear and moist.  Eyes: Conjunctivae and EOM are normal. Pupils are equal, round, and reactive to light.  Neck: Normal range of motion. Neck supple. No JVD present. No thyromegaly present.  Cardiovascular: Normal rate, regular rhythm, normal heart sounds and intact distal pulses.   No murmur heard. Pulmonary/Chest: Effort normal and breath sounds normal. No respiratory distress. He has no wheezes. He has no rales. He exhibits no tenderness.  Abdominal: Soft. Bowel sounds are normal. He exhibits no distension and no mass. There is no tenderness.  Musculoskeletal: Normal range of motion.  Lymphadenopathy:    He has no cervical adenopathy.  Neurological: He is alert and oriented to person, place, and time. He has normal strength. No cranial nerve deficit or sensory deficit.  Skin: Skin is warm and dry.  Psychiatric: He has a normal mood and affect.     Diagnostic Tests:  CLINICAL DATA: Hemoptysis. Smoker. COPD. Right lung mass seen on  chest radiograph.  EXAM:  CT CHEST WITH CONTRAST  TECHNIQUE:  Multidetector CT imaging of the chest was performed during  intravenous contrast administration.  CONTRAST: 80mL OMNIPAQUE IOHEXOL 300 MG/ML SOLN  COMPARISON: Chest radiograph on 10/13/2012  FINDINGS:  A mass with central cavitation is seen in the superior segment of    the left lower lobe, which is contiguous with the right hilum and  abuts the mediastinum. This mass measures 3.9 x 5.9 cm, and is  consistent with bronchogenic carcinoma. Mild postobstructive  pneumonitis noted in the posterior left lower lobe.  11 mm mediastinal lymph node is seen in the subcarinal region. No  other mediastinal or left hilar lymphadenopathy identified. No  evidence of pleural or pericardial effusion. Pulmonary  hyperinflation is consistent with COPD. Both adrenal glands are  normal in appearance. No evidence of chest wall mass or suspicious  bone lesions.  IMPRESSION:  5.9 cm cavitary mass in the superior segment of the left lower lobe,  consistent with primary bronchogenic carcinoma. This mass is  contiguous with the right hilum and abuts the mediastinum, and there  is mild postobstructive pneumonitis in the posterior left lower  lobe.  11 mm subcarinal mediastinal lymph node.  COPD.  Electronically Signed  By: Myles Rosenthal  On: 10/14/2012 10:21   Impression:  He has a  6 cm cavitary lung mass in the superior segment of the right lower lobe that is suspicious for bronchogenic carcinoma. This could also be a chronic lung abscess. We will obtain a PET scan although it will not differentiate lung cancer and abscess. If there is no other hypermetabolic activity then I would consider doing a lower lobectomy if his PFT's are adequate.   Plan:  He will have a PET scan and PFT's and then will return to see me.

## 2012-10-28 ENCOUNTER — Ambulatory Visit (INDEPENDENT_AMBULATORY_CARE_PROVIDER_SITE_OTHER): Payer: Medicare Other

## 2012-10-28 ENCOUNTER — Ambulatory Visit: Payer: Medicare Other | Admitting: Family Medicine

## 2012-10-28 ENCOUNTER — Ambulatory Visit (INDEPENDENT_AMBULATORY_CARE_PROVIDER_SITE_OTHER): Payer: Medicare Other | Admitting: Family Medicine

## 2012-10-28 ENCOUNTER — Encounter: Payer: Self-pay | Admitting: Family Medicine

## 2012-10-28 VITALS — BP 134/72 | HR 59 | Temp 97.4°F | Ht 71.5 in | Wt 166.0 lb

## 2012-10-28 DIAGNOSIS — G629 Polyneuropathy, unspecified: Secondary | ICD-10-CM

## 2012-10-28 DIAGNOSIS — G589 Mononeuropathy, unspecified: Secondary | ICD-10-CM

## 2012-10-28 DIAGNOSIS — C3490 Malignant neoplasm of unspecified part of unspecified bronchus or lung: Secondary | ICD-10-CM

## 2012-10-28 DIAGNOSIS — C3491 Malignant neoplasm of unspecified part of right bronchus or lung: Secondary | ICD-10-CM

## 2012-10-28 DIAGNOSIS — M549 Dorsalgia, unspecified: Secondary | ICD-10-CM

## 2012-10-28 LAB — POCT UA - MICROSCOPIC ONLY
Bacteria, U Microscopic: NEGATIVE
WBC, Ur, HPF, POC: NEGATIVE
Yeast, UA: NEGATIVE

## 2012-10-28 LAB — POCT URINALYSIS DIPSTICK
Bilirubin, UA: NEGATIVE
Glucose, UA: NEGATIVE
Ketones, UA: NEGATIVE
Leukocytes, UA: NEGATIVE

## 2012-10-28 MED ORDER — TRAMADOL HCL 50 MG PO TABS: 50.0000 mg | ORAL_TABLET | Freq: Two times a day (BID) | ORAL | Status: DC | PRN

## 2012-10-28 MED ORDER — MELOXICAM 15 MG PO TABS: 15.0000 mg | ORAL_TABLET | Freq: Every day | ORAL | Status: DC

## 2012-10-28 NOTE — Patient Instructions (Addendum)
Continue current medications. Continue good therapeutic lifestyle changes.  Fall precautions discussed with patient. Follow up as planned and earlier as needed.  Use warm wet compresses to low back and left hip Take medication as directed

## 2012-10-28 NOTE — Progress Notes (Signed)
Subjective:    Patient ID: [name], male    DOB: 1933-09-12, 79 y.o.   MRN: [id number]  HPI Patient here today for back pain and possible uti. Patient has had no burning or discomfort with voiding. This pain has been going on since 2 days ago and is most severe with seating or a rising from a sitting position. It feels better with standing. He is scheduled for a PET scan next week because of an abnormal lung findings.    Patient Active Problem List   Diagnosis Date Noted  . Cavitating mass in right lower lung lobe 10/26/2012  . COPD (chronic obstructive pulmonary disease)   . Allergy   . 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 simvastatin (ZOCOR) 10 MG tablet Take 1 tablet (10 mg total) by mouth at bedtime.  90 tablet  3  . [DISCONTINUED] levofloxacin (LEVAQUIN) 500 MG tablet Take 1 tablet (500 mg total) by mouth daily.  7 tablet  0   No facility-administered encounter medications on file as of 10/28/2012.    Review of Systems  Constitutional: Negative.   HENT: Negative.   Eyes: Negative.   Respiratory: Negative.   Cardiovascular: Negative.   Gastrointestinal: Negative.   Endocrine: Negative.   Genitourinary: Negative.   Musculoskeletal: Positive for back pain.  Skin: Negative.   Allergic/Immunologic: Negative.   Neurological: Negative.   Hematological: Negative.   Psychiatric/Behavioral: Negative.        Objective:   Physical Exam  Nursing note and vitals reviewed. Constitutional: He is oriented to person, place, and time. He appears well-developed and well-nourished. No distress.  Abdominal: Soft. Bowel sounds are normal. He exhibits no distension and no mass. There is no tenderness. There is no rebound and no guarding.  No inguinal nodes  Musculoskeletal: He exhibits no edema and no tenderness.  Leg raising was good bilaterally Leg lowering on the left produced left low back pain  with radiation to the left hip Hip abduction bilaterally did not produce pain Leg lowering on the right call as left low back pain 2 left hip on the left  Neurological: He is alert and oriented to person, place, and time. He has normal reflexes. No cranial nerve deficit.  Skin: Skin is warm and dry. No rash noted. No erythema.  Psychiatric: He has a normal mood and affect. His behavior is normal. Judgment and thought content normal.   BP 134/72  Pulse 59  Temp(Src) 97.4 F (36.3 C) (Oral)  Ht 5' 11.5" (1.816 m)  Wt 166 lb (75.297 kg)  BMI 22.83 kg/m2  [facility] reading (PRIMARY) by  Dr.Breiona Couvillon;-LS-spine; degenerative changes especially at L5-S1                                 Results for orders placed in visit on 10/28/12  POCT URINALYSIS DIPSTICK      Result Value Range   Color, UA yellow     Clarity, UA clear     Glucose, UA neg     Bilirubin, UA neg     Ketones, UA neg     Spec Grav, UA 1.020     Blood, UA neg     pH, UA 5.0     Protein, UA neg     Urobilinogen, UA negative     Nitrite, UA neg     Leukocytes, UA Negative  POCT UA - MICROSCOPIC ONLY      Result Value Range   WBC, Ur, HPF, POC neg     RBC, urine, microscopic 1-5     Bacteria, U Microscopic neg     Mucus, UA mod     Epithelial cells, urine per micros occ     Crystals, Ur, HPF, POC neg     Casts, Ur, LPF, POC neg     Yeast, UA neg              Assessment & Plan:   1. Back pain   2. Neuropathy   3. BC (bronchogenic carcinoma), right    Orders Placed This Encounter  Procedures  . DG Lumbar Spine 2-3 Views    Standing Status: Future     Number of Occurrences: 1     Standing Expiration Date: 12/28/2013    Order Specific Question:  Reason for Exam (SYMPTOM  OR DIAGNOSIS REQUIRED)    Answer:  low back pain and neuropathy    Order Specific Question:  Preferred imaging location?    Answer:  Internal  . POCT urinalysis dipstick  . POCT UA - Microscopic Only   Meds ordered this encounter   Medications  . meloxicam (MOBIC) 15 MG tablet    Sig: Take 1 tablet (15 mg total) by mouth daily.    Dispense:  30 tablet    Refill:  1  . traMADol (ULTRAM) 50 MG tablet    Sig: Take 1 tablet (50 mg total) by mouth 2 (two) times daily as needed for pain.    Dispense:  60 tablet    Refill:  0   Patient Instructions  Continue current medications. Continue good therapeutic lifestyle changes.  Fall precautions discussed with patient. Follow up as planned and earlier as needed.  Use warm wet compresses to low back and left hip Take medication as directed   Nyra Capes MD

## 2012-11-04 ENCOUNTER — Encounter (HOSPITAL_COMMUNITY)
Admission: RE | Admit: 2012-11-04 | Discharge: 2012-11-04 | Disposition: A | Discharge status: Home / Self Care | Payer: Medicare Other | Source: Ambulatory Visit | Attending: Surgery | Admitting: Surgery

## 2012-11-04 ENCOUNTER — Ambulatory Visit (HOSPITAL_COMMUNITY)
Admission: RE | Admit: 2012-11-04 | Discharge: 2012-11-04 | Disposition: A | Discharge status: Home / Self Care | Payer: Medicare Other | Source: Ambulatory Visit | Attending: Surgery | Admitting: Surgery

## 2012-11-04 DIAGNOSIS — J449 Chronic obstructive pulmonary disease, unspecified: Secondary | ICD-10-CM

## 2012-11-04 DIAGNOSIS — C3430 Malignant neoplasm of lower lobe, unspecified bronchus or lung: Secondary | ICD-10-CM | POA: Insufficient documentation

## 2012-11-04 DIAGNOSIS — R918 Other nonspecific abnormal finding of lung field: Secondary | ICD-10-CM

## 2012-11-04 LAB — PULMONARY FUNCTION TEST

## 2012-11-04 MED ORDER — TECHNETIUM TC 99M SULFUR COLLOID FILTERED: 20.3000 | Freq: Once | INTRAVENOUS | Status: DC | PRN

## 2012-11-04 MED ORDER — FLUDEOXYGLUCOSE F - 18 (FDG) INJECTION
20.3000 | Freq: Once | INTRAVENOUS | Status: AC | PRN
  Administered 2012-11-04: 20.3 via INTRAVENOUS

## 2012-11-04 MED ORDER — ALBUTEROL SULFATE (5 MG/ML) 0.5% IN NEBU
2.5000 mg | INHALATION_SOLUTION | Freq: Once | RESPIRATORY_TRACT | Status: AC
  Administered 2012-11-04: 2.5 mg via RESPIRATORY_TRACT

## 2012-11-05 ENCOUNTER — Encounter: Payer: Self-pay | Admitting: Surgery

## 2012-11-05 ENCOUNTER — Other Ambulatory Visit: Payer: Self-pay | Admitting: *Deleted

## 2012-11-05 ENCOUNTER — Ambulatory Visit (INDEPENDENT_AMBULATORY_CARE_PROVIDER_SITE_OTHER): Payer: PRIVATE HEALTH INSURANCE | Admitting: Surgery

## 2012-11-05 VITALS — BP 159/77 | HR 65 | Resp 16 | Ht 71.5 in | Wt 167.0 lb

## 2012-11-05 DIAGNOSIS — R599 Enlarged lymph nodes, unspecified: Secondary | ICD-10-CM

## 2012-11-05 DIAGNOSIS — J984 Other disorders of lung: Secondary | ICD-10-CM

## 2012-11-05 DIAGNOSIS — R590 Localized enlarged lymph nodes: Secondary | ICD-10-CM

## 2012-11-05 DIAGNOSIS — R911 Solitary pulmonary nodule: Secondary | ICD-10-CM

## 2012-11-05 DIAGNOSIS — R918 Other nonspecific abnormal finding of lung field: Secondary | ICD-10-CM

## 2012-11-05 NOTE — Progress Notes (Signed)
[address]       [place] [address]             [phone]        HPI:  The patient returns today to discuss the results of his recent PET scan and PFT's. He feels well. He denies cough or sputum production and has no dyspnea.  Current Outpatient Prescriptions  Medication Sig Dispense Refill  . meloxicam (MOBIC) 15 MG tablet Take 1 tablet (15 mg total) by mouth daily.  30 tablet  1  . simvastatin (ZOCOR) 10 MG tablet Take 1 tablet (10 mg total) by mouth at bedtime.  90 tablet  3  . traMADol (ULTRAM) 50 MG tablet Take 1 tablet (50 mg total) by mouth 2 (two) times daily as needed for pain.  60 tablet  0   No current facility-administered medications for this visit.     Physical Exam:  BP 159/77  Pulse 65  Resp 16  Ht 5' 11.5" (1.816 m)  Wt 167 lb (75.751 kg)  BMI 22.97 kg/m2  SpO2 97% Constitutional: He is oriented to person, place, and time. He appears well-developed and well-nourished. No distress.  Cardiovascular: Normal rate, regular rhythm, normal heart sounds and intact distal pulses.  No murmur heard.  Pulmonary/Chest: Effort normal and breath sounds normal. No respiratory distress. He has no wheezes. He has no rales. He exhibits no tenderness.    Diagnostic Tests:  CLINICAL DATA: INITIAL treatment strategy for RIGHT LOWER LOBE LUNG  MASS.  EXAM:  NUCLEAR MEDICINE PET SKULL BASE TO THIGH  FASTING BLOOD GLUCOSE: Value: 82 mg/dl  TECHNIQUE:  27.2 mCi Z-36 FDG was injected intravenously. CT data was obtained  and used for attenuation correction and anatomic localization only.  (This was not acquired as a diagnostic CT examination.) Additional  exam technical data entered on technologist worksheet.  COMPARISON: None.  FINDINGS:  NECK  NO HYPERMETABOLIC LYMPH NODES WITHIN THE NECK.  CHEST  POSTERIOR MEDIAL RIGHT LOWER LOBE LUNG MASS MEASURES 6.4 X 4.1 X 5.2  CM. THERE IS INTENSE FDG UPTAKE ASSOCIATED WITH THIS MASS WITHIN SUV  MAX  EQUAL TO 19.5. THERE IS A HYPERMETABOLIC PARA ESOPHAGEAL LYMPH  NODE WHICH MEASURES 1.2 CM AND HAS AN SUV MAX EQUAL TO 7.4 NO  CONTRALATERAL HYPERMETABOLIC MEDIASTINAL OR HILAR LYMPH NODES  IDENTIFIED.  THERE IS NO HYPERMETABOLIC SUPRACLAVICULAR OR AXILLARY LYMPH NODES.  ABDOMEN/PELVIS  NO ABNORMAL HYPERMETABOLIC ACTIVITY WITHIN THE LIVER PANCREAS  ADRENAL GLANDS OR SPLEEN. THERE IS NO HYPERMETABOLIC UPPER ABDOMINAL  OR PELVIC ADENOPATHY.  SKELETON  NO FOCAL HYPERMETABOLIC ACTIVITY TO SUGGEST SKELETAL METASTASIS.  IMPRESSION:  1. LARGE, NECROTIC MASS WITHIN THE RIGHT LOWER LOBE EXHIBITS  MALIGNANT RANGE FDG UPTAKE AND IS COMPATIBLE WITH PRIMARY LUNG  NEOPLASM.  2. HYPERMETABOLIC PARAESOPHAGEAL LYMPH NODE COMPATIBLE WITH  IPSILATERAL MEDIASTINAL LYMPH NODE METASTASIS.  3. NO EVIDENCE FOR CONTRALATERAL MEDIASTINAL OR HILAR HYPERMETABOLIC  ADENOPATHY. NO EVIDENCE FOR HYPERMETABOLIC DISTANT METASTATIC  DISEASE.  Electronically Signed  By: Signa Kell M.D.  On: 11/04/2012 15:15     PFT's show normal spirometry with a moderate reduction in corrected diffusion capacity to 55% of predicted.   Impression:  He has a cavitary RLL lung mass that is hypermetabolic on PET scan and is most likely a cavitary lung cancer. There is a hypermetabolic paraesophageal lymph node adjacent to this suggesting lymph node metastasis. There is no other hypermetabolic adenopathy and no evidence of distant metastatic disease. His PFT's are  adequate to tolerate right lower lobectomy which I think is the best treatment. I discussed the operative procedure with the patient and family including alternatives, benefits, and risks including but not limited to bleeding, blood transfusion, infection, prolonged air leak, bronchial stump complications, and recurrent cancer. He understands and agrees to proceed.  Plan:  Flexible bronchoscopy and right thoracotomy for right lower lobectomy on Tuesday  11/18/2012.

## 2012-11-10 ENCOUNTER — Ambulatory Visit (HOSPITAL_COMMUNITY)
Admission: RE | Admit: 2012-11-10 | Discharge: 2012-11-10 | Disposition: A | Discharge status: Home / Self Care | Payer: Medicare Other | Source: Ambulatory Visit | Attending: Surgery | Admitting: Surgery

## 2012-11-10 DIAGNOSIS — I6789 Other cerebrovascular disease: Secondary | ICD-10-CM | POA: Insufficient documentation

## 2012-11-10 DIAGNOSIS — C3490 Malignant neoplasm of unspecified part of unspecified bronchus or lung: Secondary | ICD-10-CM | POA: Insufficient documentation

## 2012-11-10 DIAGNOSIS — R911 Solitary pulmonary nodule: Secondary | ICD-10-CM

## 2012-11-10 DIAGNOSIS — C7931 Secondary malignant neoplasm of brain: Secondary | ICD-10-CM | POA: Insufficient documentation

## 2012-11-10 DIAGNOSIS — G319 Degenerative disease of nervous system, unspecified: Secondary | ICD-10-CM | POA: Insufficient documentation

## 2012-11-10 DIAGNOSIS — R5381 Other malaise: Secondary | ICD-10-CM | POA: Insufficient documentation

## 2012-11-10 MED ORDER — GADOBENATE DIMEGLUMINE 529 MG/ML IV SOLN
15.0000 mL | Freq: Once | INTRAVENOUS | Status: AC | PRN
  Administered 2012-11-10: 15 mL via INTRAVENOUS

## 2012-11-11 LAB — POCT I-STAT CREATININE: Creatinine, Ser: 1 mg/dL (ref 0.50–1.35)

## 2012-11-12 ENCOUNTER — Other Ambulatory Visit: Payer: Self-pay

## 2012-11-12 ENCOUNTER — Other Ambulatory Visit: Payer: Self-pay | Admitting: *Deleted

## 2012-11-12 DIAGNOSIS — D381 Neoplasm of uncertain behavior of trachea, bronchus and lung: Secondary | ICD-10-CM

## 2012-11-13 ENCOUNTER — Telehealth: Payer: Self-pay | Admitting: Family Medicine

## 2012-11-14 ENCOUNTER — Other Ambulatory Visit (HOSPITAL_COMMUNITY): Payer: PRIVATE HEALTH INSURANCE

## 2012-11-18 ENCOUNTER — Encounter (HOSPITAL_COMMUNITY): Admission: RE | Payer: Self-pay | Source: Ambulatory Visit

## 2012-11-18 ENCOUNTER — Inpatient Hospital Stay (HOSPITAL_COMMUNITY): Admission: RE | Admit: 2012-11-18 | Payer: PRIVATE HEALTH INSURANCE | Source: Ambulatory Visit | Admitting: Surgery

## 2012-11-18 SURGERY — BRONCHOSCOPY, FLEXIBLE
Anesthesia: General | Site: Chest | Laterality: Right

## 2012-11-20 ENCOUNTER — Encounter (HOSPITAL_COMMUNITY): Payer: Self-pay | Admitting: Pharmacy Technician

## 2012-11-21 ENCOUNTER — Other Ambulatory Visit: Payer: Self-pay | Admitting: Radiology

## 2012-11-24 ENCOUNTER — Encounter (HOSPITAL_COMMUNITY): Payer: Self-pay

## 2012-11-24 ENCOUNTER — Ambulatory Visit (HOSPITAL_COMMUNITY)
Admission: RE | Admit: 2012-11-24 | Discharge: 2012-11-24 | Disposition: A | Discharge status: Home / Self Care | Payer: Medicare Other | Source: Ambulatory Visit | Attending: Surgery | Admitting: Surgery

## 2012-11-24 ENCOUNTER — Ambulatory Visit (HOSPITAL_COMMUNITY): Admission: RE | Admit: 2012-11-24 | Payer: Medicare Other | Source: Ambulatory Visit

## 2012-11-24 DIAGNOSIS — J984 Other disorders of lung: Secondary | ICD-10-CM | POA: Insufficient documentation

## 2012-11-24 DIAGNOSIS — D381 Neoplasm of uncertain behavior of trachea, bronchus and lung: Secondary | ICD-10-CM

## 2012-11-24 LAB — PROTIME-INR: Prothrombin Time: 12.8 seconds (ref 11.6–15.2)

## 2012-11-24 LAB — CBC
MCH: 31.5 pg (ref 26.0–34.0)
MCHC: 35.5 g/dL (ref 30.0–36.0)
Platelets: 173 10*3/uL (ref 150–400)
RBC: 4.29 MIL/uL (ref 4.22–5.81)
WBC: 5.8 10*3/uL (ref 4.0–10.5)

## 2012-11-24 MED ORDER — FENTANYL CITRATE 0.05 MG/ML IJ SOLN
INTRAMUSCULAR | Status: AC | PRN
  Administered 2012-11-24 (×2): 50 ug via INTRAVENOUS

## 2012-11-24 MED ORDER — LIDOCAINE HCL 1 % IJ SOLN
INTRAMUSCULAR | Status: AC
  Filled 2012-11-24: qty 10

## 2012-11-24 MED ORDER — SODIUM CHLORIDE 0.9 % IV SOLN: Freq: Once | INTRAVENOUS | Status: DC

## 2012-11-24 MED ORDER — MIDAZOLAM HCL 2 MG/2ML IJ SOLN
INTRAMUSCULAR | Status: AC
  Filled 2012-11-24: qty 4

## 2012-11-24 MED ORDER — FENTANYL CITRATE 0.05 MG/ML IJ SOLN
INTRAMUSCULAR | Status: AC
  Filled 2012-11-24: qty 4

## 2012-11-24 MED ORDER — HYDROCODONE-ACETAMINOPHEN 5-325 MG PO TABS
1.0000 | ORAL_TABLET | ORAL | Status: DC | PRN
  Filled 2012-11-24: qty 2

## 2012-11-24 MED ORDER — MIDAZOLAM HCL 5 MG/5ML IJ SOLN
INTRAMUSCULAR | Status: AC | PRN
  Administered 2012-11-24: 1 mg via INTRAVENOUS

## 2012-11-24 MED ORDER — MIDAZOLAM HCL 2 MG/2ML IJ SOLN
INTRAMUSCULAR | Status: AC | PRN
  Administered 2012-11-24 (×2): 1 mg via INTRAVENOUS

## 2012-11-24 NOTE — H&P (Signed)
Chief Complaint: "I am here for a right lung biopsy." Referring Physician: Dr. Laneta Simmers HPI: [name] is an 79 y.o. male who presents today for a right lower lobe lung mass biopsy. The patient has had several episodes of hemoptysis in august and September of this year, pink in color and has not had any episodes recently. He did have a CXR and CT of his chest on 10/16/12 revealing RLL lung mass with a PET on 11/04/12 with hypermetabolic activity in the RLL lung region. He does have history of tobacco use for many years and did quit in 2001. He also c/o 10 lb weight loss in the past year and occassional night sweats. He denies any blood in his stool or urine. He denies any change in his appetite. He denies any fever or chills. He denies any chest pain or shortness of breath.   Past Medical History:  Past Medical History  Diagnosis Date  . COPD (chronic obstructive pulmonary disease)   . Allergy     allergic rhinitis  . Cancer     Patient has a lung mass which is being evaluated    Past Surgical History:  Past Surgical History  Procedure Laterality Date  . Cholecystectomy      Family History:  Family History  Problem Relation Age of Onset  . Cancer Mother     breast  . Stroke Father     Social History:  reports that he quit smoking about 14 years ago. He has never used smokeless tobacco. He reports that he does not drink alcohol or use illicit drugs.  Allergies:  Allergies  Allergen Reactions  . Sulfa Antibiotics Nausea And Vomiting      Medication List    ASK your doctor about these medications       acetaminophen 500 MG tablet  Commonly known as:  TYLENOL  Take 500 mg by mouth every 6 (six) hours as needed.     clobetasol cream 0.05 %  Commonly known as:  TEMOVATE  Apply 1 application topically daily as needed (for itching).     simvastatin 10 MG tablet  Commonly known as:  ZOCOR  Take 1 tablet (10 mg total) by mouth at bedtime.     SYSTANE OP  Place 1 drop into  both eyes daily.        Please HPI for pertinent positives, otherwise complete 10 system ROS negative.  Physical Exam: Pulse 76  Temp(Src) 97.1 F (36.2 C) (Oral)  Resp 18  Ht 5\' 10"  (1.778 m)  Wt 167 lb (75.751 kg)  BMI 23.96 kg/m2  SpO2 97% Body mass index is 23.96 kg/(m^2).   General Appearance:  Alert, cooperative, no distress  Head:  Normocephalic, without obvious abnormality, atraumatic  Neck: Supple, symmetrical, trachea midline  Lungs:   Clear to auscultation bilaterally, no w/r/r, respirations unlabored without use of accessory muscles.  Chest Wall:  No tenderness or deformity  Heart:  Regular rate and rhythm, S1, S2 normal, no murmur, rub or gallop.  Abdomen:   Soft, non-tender, non distended, (+) BS  Extremities: Extremities normal, atraumatic, no cyanosis or edema  Pulses: 2+ and symmetric  Neurologic: Normal affect, no gross deficits.   Results for orders placed during the hospital encounter of 11/24/12 (from the past 48 hour(s))  APTT     Status: None   Collection Time    11/24/12  7:52 AM      Result Value Range   aPTT 33  24 - 37  seconds  CBC     Status: Abnormal   Collection Time    11/24/12  7:52 AM      Result Value Range   WBC 5.8  4.0 - 10.5 K/uL   RBC 4.29  4.22 - 5.81 MIL/uL   Hemoglobin 13.5  13.0 - 17.0 g/dL   HCT 45.4 (*) 09.8 - 11.9 %   MCV 88.6  78.0 - 100.0 fL   MCH 31.5  26.0 - 34.0 pg   MCHC 35.5  30.0 - 36.0 g/dL   RDW 14.7  82.9 - 56.2 %   Platelets 173  150 - 400 K/uL  PROTIME-INR     Status: None   Collection Time    11/24/12  7:52 AM      Result Value Range   Prothrombin Time 12.8  11.6 - 15.2 seconds   INR 0.98  0.00 - 1.49   No results found.  Assessment/Plan Hemoptysis. History of tobacco abuse, quit 2001. Right lower lobe lung mass CT 10/16/12, (+) PET 11/04/12 Request for image guided RLL lung mass biopsy. Images and labs have been reviewed, patient has been NPO Risks and Benefits discussed with the patient and his  family. All of the patient's questions were answered, patient is agreeable to proceed. Consent signed and in chart.   Pattricia Boss D PA-C 11/24/2012, 8:28 AM

## 2012-11-24 NOTE — Procedures (Signed)
CT core biopsy RLL lesion 18g core x2 No ptx. No complication No blood loss. See complete dictation in [facility].

## 2012-11-26 ENCOUNTER — Telehealth: Payer: Self-pay | Admitting: *Deleted

## 2012-11-26 NOTE — Telephone Encounter (Signed)
Spoke with pt wife regarding appt for mtoc 11/27/12.  She verbalized understanding of time and place of appt

## 2012-11-26 NOTE — Telephone Encounter (Signed)
Pt wife called and was confused about appt in my chart.  I explained about scheduling for mdc is different than regular scheduling.  I gave her the time to be at [facility].  She verbalized understanding of time and place of appt

## 2012-11-27 ENCOUNTER — Ambulatory Visit (HOSPITAL_BASED_OUTPATIENT_CLINIC_OR_DEPARTMENT_OTHER): Payer: Medicare Other | Admitting: Internal Medicine

## 2012-11-27 ENCOUNTER — Telehealth: Payer: Self-pay | Admitting: Internal Medicine

## 2012-11-27 ENCOUNTER — Ambulatory Visit: Payer: Medicare Other | Attending: Internal Medicine | Admitting: Physical Therapy

## 2012-11-27 ENCOUNTER — Encounter: Payer: Self-pay | Admitting: Internal Medicine

## 2012-11-27 ENCOUNTER — Encounter: Payer: Self-pay | Admitting: *Deleted

## 2012-11-27 ENCOUNTER — Other Ambulatory Visit: Payer: Self-pay | Admitting: Radiation Therapy

## 2012-11-27 ENCOUNTER — Ambulatory Visit
Admission: RE | Admit: 2012-11-27 | Discharge: 2012-11-27 | Disposition: A | Discharge status: Home / Self Care | Payer: Medicare Other | Source: Ambulatory Visit | Attending: Radiation Oncology | Admitting: Radiation Oncology

## 2012-11-27 DIAGNOSIS — C7931 Secondary malignant neoplasm of brain: Secondary | ICD-10-CM

## 2012-11-27 DIAGNOSIS — C3430 Malignant neoplasm of lower lobe, unspecified bronchus or lung: Secondary | ICD-10-CM

## 2012-11-27 DIAGNOSIS — R293 Abnormal posture: Secondary | ICD-10-CM | POA: Insufficient documentation

## 2012-11-27 DIAGNOSIS — IMO0001 Reserved for inherently not codable concepts without codable children: Secondary | ICD-10-CM | POA: Insufficient documentation

## 2012-11-27 DIAGNOSIS — C3490 Malignant neoplasm of unspecified part of unspecified bronchus or lung: Secondary | ICD-10-CM | POA: Insufficient documentation

## 2012-11-27 DIAGNOSIS — C3431 Malignant neoplasm of lower lobe, right bronchus or lung: Secondary | ICD-10-CM | POA: Insufficient documentation

## 2012-11-27 NOTE — Telephone Encounter (Signed)
Gave pt appt for lab and Md in 3 weeks

## 2012-11-28 ENCOUNTER — Other Ambulatory Visit: Payer: Self-pay | Admitting: Radiation Therapy

## 2012-11-28 DIAGNOSIS — C7931 Secondary malignant neoplasm of brain: Secondary | ICD-10-CM

## 2012-11-29 NOTE — Progress Notes (Addendum)
[facility] [phone]   [phone] Multidisciplinary thoracic oncology clinic (MTOC)  CONSULT NOTE  REFERRING PHYSICIAN: Dr. Bill Salinas  REASON FOR CONSULTATION:  79 years old white male recently diagnosed with lung cancer.   HPI [name] is a 79 y.o. male with no significant past medical history except for COPD and allergic rhinitis as well as history of gallstones and history of smoking but quit in 2000. The patient mentions that in July 2014 has been complaining of several weeks all chest congestion and cough. He was treated for bronchitis with a course of antibiotics with some improvement but his symptoms returned and he had few episodes of hemoptysis. He had chest x-ray performed on 09/11/2012 that was unremarkable. His symptoms continues to get worse and on 10/16/2012 he had CT scan of the chest performed and it showed a mass with central cavitation is seen in the superior segment of the left lower lobe, which is contiguous with the right hilum and abuts the mediastinum. This mass measures 3.9 x 5.9 cm, and is consistent with bronchogenic carcinoma. Mild postobstructive pneumonitis noted in the posterior left lower lobe. 11 mm mediastinal lymph node is seen in the subcarinal region. No other mediastinal or left hilar lymphadenopathy identified. No evidence of pleural or pericardial effusion. He was referred to Dr. Laneta Simmers who ordered a PET scan and this was performed on 11/04/2012 and it showed the large necrotic mass within the right lower lobe with malignant change FDG uptake compatible with primary lung neoplasm. There was also a hypermetabolic paraesophageal lymph nodes compatible with ipsilateral mediastinal lymph node metastases. There was no evidence for contralateral mediastinal or hilar lymphadenopathy and no evidence for hypermetabolic distant metastatic disease.  MRI of the brain on 11/10/2012 showed at least 3 intracranial metastatic  deposits detected.  On 11/24/2012 the patient underwent CT-guided core biopsy of the right lower lobe lung lesion by interventional radiology. The final pathology (Accession: [id number]) showed poorly differentiated non-small cell carcinoma with lymphovascular invasion. Immunohistochemical stains are performed. The carcinoma is positive for TTF-1 and cytokeratin 7 with focal p63 and cytokeratin 5/6 positivity. The findings are consistent with a poorly differentiated non-small cell carcinoma with adenocarcinoma and squamous differentiation. Dr. Laneta Simmers kindly referred the patient to me today for further evaluation and recommendation regarding treatment of his condition. When seen today the patient is feeling fine with no specific complaints except for mild dry cough but no significant chest pain or shortness of breath. He lost around 10 pounds over the last 4 months. He denied having any current hemoptysis. The patient denied having any headache or blurry vision. He denied having any nausea, vomiting or change in his bowel movement. His family history significant for mother who was diagnosed with breast cancer at age 68 and father with a stroke. The patient is married and has one son. He was accompanied today by his wife [name] and his daughter-in-law. He used to work as Insurance claims handler. He has a history of smoking less than one pack per day for around 46 years and quit in September of 2000. He has no history of alcohol or drug abuse.  HPI  Past Medical History  Diagnosis Date  . COPD (chronic obstructive pulmonary disease)   . Allergy     allergic rhinitis  . Cancer     Patient has a lung mass which is being evaluated    Past Surgical History  Procedure Laterality Date  . Cholecystectomy  Family History  Problem Relation Age of Onset  . Cancer Mother     breast  . Stroke Father     Social History History  Substance Use Topics  . Smoking status: Former Smoker    Quit date: 09/11/1998   . Smokeless tobacco: Never Used  . Alcohol Use: No    Allergies  Allergen Reactions  . Sulfa Antibiotics Nausea And Vomiting    Current Outpatient Prescriptions  Medication Sig Dispense Refill  . acetaminophen (TYLENOL) 500 MG tablet Take 500 mg by mouth every 6 (six) hours as needed.      . clobetasol cream (TEMOVATE) 0.05 % Apply 1 application topically daily as needed (for itching).      Bertram Gala Glycol-Propyl Glycol (SYSTANE OP) Place 1 drop into both eyes daily.      . simvastatin (ZOCOR) 10 MG tablet Take 1 tablet (10 mg total) by mouth at bedtime.  90 tablet  3   No current facility-administered medications for this visit.    Review of Systems  Constitutional: negative Eyes: negative Ears, nose, mouth, throat, and face: negative Respiratory: positive for cough Cardiovascular: negative Gastrointestinal: negative Genitourinary:negative Integument/breast: negative Hematologic/lymphatic: negative Musculoskeletal:negative Neurological: negative Behavioral/Psych: negative Endocrine: negative Allergic/Immunologic: negative  Physical Exam  AOZ:HYQMV, healthy, no distress, well nourished and well developed SKIN: skin color, texture, turgor are normal, no rashes or significant lesions HEAD: Normocephalic, No masses, lesions, tenderness or abnormalities EYES: normal, PERRLA EARS: External ears normal, Canals clear OROPHARYNX:no exudate, no erythema and lips, buccal mucosa, and tongue normal  NECK: supple, no adenopathy, no JVD LYMPH:  no palpable lymphadenopathy, no hepatosplenomegaly LUNGS: clear to auscultation , and palpation HEART: regular rate & rhythm, no murmurs and no gallops ABDOMEN:abdomen soft, non-tender, normal bowel sounds and no masses or organomegaly BACK: Back symmetric, no curvature., No CVA tenderness EXTREMITIES:no joint deformities, effusion, or inflammation, no edema, no skin discoloration  NEURO: alert & oriented x 3 with fluent speech, no  focal motor/sensory deficits  PERFORMANCE STATUS: ECOG 1  LABORATORY DATA: Lab Results  Component Value Date   WBC 5.8 11/24/2012   HGB 13.5 11/24/2012   HCT 38.0* 11/24/2012   MCV 88.6 11/24/2012   PLT 173 11/24/2012      Chemistry      Component Value Date/Time   NA CANCELED 10/13/2012 1149   NA 138 06/13/2007 0420   K CANCELED 10/13/2012 1149   CL CANCELED 10/13/2012 1149   CO2 CANCELED 10/13/2012 1149   BUN CANCELED 10/13/2012 1149   BUN 10 06/13/2007 0420   CREATININE 1.00 2012-11-11 1431      Component Value Date/Time   CALCIUM CANCELED 10/13/2012 1149   ALKPHOS CANCELED 10/13/2012 1149   AST CANCELED 10/13/2012 1149   ALT CANCELED 10/13/2012 1149   BILITOT CANCELED 10/13/2012 1149       RADIOGRAPHIC STUDIES: Mr Laqueta Jean Wo Contrast  11/11/12   CLINICAL DATA:  New diagnosis lung cancer with staging.  Weakness.  EXAM: MRI HEAD WITHOUT AND WITH CONTRAST  TECHNIQUE: Multiplanar, multiecho pulse sequences of the brain and surrounding structures were obtained according to standard protocol without and with intravenous contrast  CONTRAST:  15mL MULTIHANCE GADOBENATE DIMEGLUMINE 529 MG/ML IV SOLN  COMPARISON:  PET scan performed 11/04/2012.  FINDINGS: No evidence for acute infarction, hemorrhage, hydrocephalus, or extra-axial fluid. Moderate atrophy is present. There is extensive chronic microvascular ischemic change in the periventricular and subcortical white matter.  Post infusion, there is demonstration of 3 intracranial metastatic deposits. In the  right medial cerebellum there is an 8 x 10 mm lesion with central necrosis. In the left medial occipital lobe there is a 4 x 6 mm lesion with slight central necrosis. In the right posterior frontal cortex there is a roughly spherical 5 mm lesion with central necrosis.  No meningeal enhancement. Flow voids are maintained. No definite osseous lesions. No acute sinus disease. No mastoid fluid. Negative orbits.  IMPRESSION: At least 3  intracranial metastatic deposits are detected. 3 Tesla MRI is more sensitive in the detection of intracranial metastatic disease in should be considered as an outpatient prior to definitive treatment.  Moderate atrophy with chronic microvascular ischemic change.  These results will be called to the ordering clinician or representative by the Radiologist Assistant, and communication documented in the PACS Dashboard.   Electronically Signed   By: Davonna Belling M.D.   On: 11/10/2012 18:23   Nm Pet Image Initial (pi) Skull Base To Thigh  11/04/2012   CLINICAL DATA:  INITIAL treatment strategy for RIGHT LOWER LOBE LUNG MASS.  EXAM: NUCLEAR MEDICINE PET SKULL BASE TO THIGH  FASTING BLOOD GLUCOSE:  Value:  82 mg/dl  TECHNIQUE: 14.7 mCi W-29 FDG was injected intravenously. CT data was obtained and used for attenuation correction and anatomic localization only. (This was not acquired as a diagnostic CT examination.) Additional exam technical data entered on technologist worksheet.  COMPARISON:  None.  FINDINGS: NECK  NO HYPERMETABOLIC LYMPH NODES WITHIN THE NECK.  CHEST  POSTERIOR MEDIAL RIGHT LOWER LOBE LUNG MASS MEASURES 6.4 X 4.1 X 5.2 CM. THERE IS INTENSE FDG UPTAKE ASSOCIATED WITH THIS MASS WITHIN SUV MAX EQUAL TO 19.5. THERE IS A HYPERMETABOLIC PARA ESOPHAGEAL LYMPH NODE WHICH MEASURES 1.2 CM AND HAS AN SUV MAX EQUAL TO 7.4 NO CONTRALATERAL HYPERMETABOLIC MEDIASTINAL OR HILAR LYMPH NODES IDENTIFIED.  THERE IS NO HYPERMETABOLIC SUPRACLAVICULAR OR AXILLARY LYMPH NODES.  ABDOMEN/PELVIS  NO ABNORMAL HYPERMETABOLIC ACTIVITY WITHIN THE LIVER PANCREAS ADRENAL GLANDS OR SPLEEN. THERE IS NO HYPERMETABOLIC UPPER ABDOMINAL OR PELVIC ADENOPATHY.  SKELETON  NO FOCAL HYPERMETABOLIC ACTIVITY TO SUGGEST SKELETAL METASTASIS.  IMPRESSION: 1. LARGE, NECROTIC MASS WITHIN THE RIGHT LOWER LOBE EXHIBITS MALIGNANT RANGE FDG UPTAKE AND IS COMPATIBLE WITH PRIMARY LUNG NEOPLASM.  2. HYPERMETABOLIC PARAESOPHAGEAL LYMPH NODE COMPATIBLE WITH  IPSILATERAL MEDIASTINAL LYMPH NODE METASTASIS.  3. NO EVIDENCE FOR CONTRALATERAL MEDIASTINAL OR HILAR HYPERMETABOLIC ADENOPATHY. NO EVIDENCE FOR HYPERMETABOLIC DISTANT METASTATIC DISEASE.   Electronically Signed   By: Signa Kell M.D.   On: 11/04/2012 15:15   Ct Biopsy  11/24/2012   CLINICAL DATA:  Hypermetabolic cavitary right lower lobe lung lesion.  EXAM: CT GUIDED CORE BIOPSY OF RIGHT LOWER LOBE LUNG LESION  ANESTHESIA/SEDATION: Intravenous Fentanyl and Versed were administered as conscious sedation during continuous cardiorespiratory monitoring by the radiology RN, with a total moderate sedation time of 16 minutes.  PROCEDURE: The procedure risks, benefits, and alternatives were explained to the patient. Questions regarding the procedure were encouraged and answered. The patient understands and consents to the procedure. Patient placed in right lateral decubitus position. Limited axial scans obtained through the thorax. An appropriate skin entry site was determined.  The posterior thorax was prepped with Betadinein a sterile fashion, and a sterile drape was applied covering the operative field. A sterile gown and sterile gloves were used for the procedure. Local anesthesia was provided with 1% Lidocaine. Under CT fluoroscopic guidance, a 17 gauge trocar needle was advanced to the margin of the lesion. Once needle tip position was confirmed, coaxial 18-gauge  core biopsy samples were obtained, submitted in formalin to surgical pathology. The guide needle was removed. Postprocedure scans show no pneumothorax or other apparent complication. The patient tolerated the procedure well.  Complications: None  : IMPRESSION:  Technically successful CT-guided core biopsy of right lower lobe lung lesion. Surveillance and post procedure radiography scheduled.   Electronically Signed   By: Oley Balm M.D.   On: 11/24/2012 10:46   Dg Chest Port 1 View  11/24/2012   CLINICAL DATA:  Right lung biopsy.  EXAM:  PORTABLE CHEST - 1 VIEW  COMPARISON:  CT biopsy images 11 10/2012.  Chest x-ray 10/13/2012.  FINDINGS: Mediastinum is normal. Heart size normal. No pneumothorax. Changes of COPD. Infiltrative changes of the right lower lobe. Hemidiaphragms are not completely imaged. Next feel  IMPRESSION: No evidence of pneumothorax post biopsy.   Electronically Signed   By: Maisie Fus  Register   On: 11/24/2012 11:17    ASSESSMENT: This is a very pleasant 79 years old white male recently diagnosed with metastatic non-small cell lung cancer, adenocarcinoma with squamous differentiation presented with left lower lobe lung mass in addition to mediastinal lymphadenopathy and brain metastasis diagnosed in October of 2014.   PLAN: I had a lengthy discussion with the patient and his family today about his current disease stage, prognosis and treatment options. I gave the patient the option of palliative systemic chemotherapy as well as palliative radiation to the brain versus referral to palliative care and hospice.  The patient is interested in treatment. He would be seen later today by Dr. Mitzi Hansen for evaluation and consideration of stereotactic radiotherapy to the metastatic brain lesions.  I will send his tissue block to be tested for molecular biomarkers by [facility]. This test may take up to 2 weeks to have the results back. The patient will be receiving palliative radiotherapy during this time. If he has no actionable mutations, I would consider him for systemic chemotherapy with carboplatin for AUC of 5 and Alimta 500 mg/M2 every 3 weeks. I discussed with the patient adverse effect of the chemotherapy including but not limited to alopecia, myelosuppression, nausea and vomiting, peripheral neuropathy, liver or renal dysfunction. I will arrange for the patient a follow up appointment in 3 weeks for reevaluation and more detailed discussion of his systemic treatment options based on the molecular biomarkers results. I  gave the patient and his family the time to ask questions and I answered them completely to their satisfaction. The patient and his family agreed to the current plan. He was advised to call immediately if he has any concerning symptoms in the interval.  The patient voices understanding of current disease status and treatment options and is in agreement with the current care plan.  All questions were answered. The patient knows to call the clinic with any problems, questions or concerns. We can certainly see the patient much sooner if necessary.  Thank you so much for allowing me to participate in the care of [name]. I will continue to follow up the patient with you and assist in his care.  I spent 55 minutes counseling the patient face to face. The total time spent in the appointment was 70 minutes.  Daira Hine K. 11/29/2012, 5:02 PM

## 2012-11-29 NOTE — Patient Instructions (Signed)
Continue to irradiation under the care of Dr. Mitzi Hansen as scheduled. Follow up visit in 3 weeks for evaluation and discussion of the treatment options.

## 2012-12-02 ENCOUNTER — Ambulatory Visit
Admission: RE | Admit: 2012-12-02 | Discharge: 2012-12-02 | Disposition: A | Discharge status: Home / Self Care | Payer: Medicare Other | Source: Ambulatory Visit | Attending: Radiation Oncology | Admitting: Radiation Oncology

## 2012-12-02 DIAGNOSIS — C801 Malignant (primary) neoplasm, unspecified: Secondary | ICD-10-CM | POA: Insufficient documentation

## 2012-12-02 DIAGNOSIS — C3430 Malignant neoplasm of lower lobe, unspecified bronchus or lung: Secondary | ICD-10-CM | POA: Insufficient documentation

## 2012-12-02 DIAGNOSIS — C7931 Secondary malignant neoplasm of brain: Secondary | ICD-10-CM | POA: Insufficient documentation

## 2012-12-02 LAB — BUN AND CREATININE (CC13)
BUN: 14.8 mg/dL (ref 7.0–26.0)
Creatinine: 0.8 mg/dL (ref 0.7–1.3)

## 2012-12-02 MED ORDER — GADOBENATE DIMEGLUMINE 529 MG/ML IV SOLN: 15.0000 mL | Freq: Once | INTRAVENOUS | Status: AC | PRN

## 2012-12-02 NOTE — Progress Notes (Signed)
Radiation Oncology         [phone] ________________________________  Name: [name] MRN: [id number]  Date: 11/27/2012  DOB: Jul 25, 1933  NW:GNFAO, Preston Hill, MD  Alleen Borne, MD   Si Gaul, MD  REFERRING PHYSICIAN: Alleen Borne, MD   DIAGNOSIS: The primary encounter diagnosis was Lung cancer, right. A diagnosis of Brain metastasis was also pertinent to this visit.   HISTORY OF PRESENT ILLNESS::[name] is a 79 y.o. male who is seen for an initial consultation visit. You again experienced a congestion and cough in July of 2014. He also notes some hemoptysis beginning at this time as well. He was given antibiotics for presumed bronchitis and he subsequently had a chest x-ray which was unremarkable. However his symptoms persisted and therefore he proceeded to undergo a CT scan of the chest on 10/14/2012. This revealed a 5.9 cm cavitary mass in the superior segment of the left lower lobe consistent with primary bronchogenic carcinoma. The mass was contiguous with the right hilum and abuts the mediastinum. Some mild postobstructive pneumonitis was present as well as an 11 mm subcarinal lymph nodes.  The patient also proceeded to undergo a PET scan on 11/04/2012. This showed a hypermetabolic large necrotic mass within the right lower lobe which again was consistent with a primary lung neoplasm. He also had hypermetabolic paraesophageal lymph node metastasis. No evidence of distant hypermetabolic metastatic disease on the scan. The patient also had an MRI scan of the brain which showed at least 3 intracranial metastatic deposits which were detected on this screening study.  The patient proceeded to undergo a CT-guided core biopsy of the right lower lobe lung lesion. This returned positive for a poorly differentiated non-small cell carcinoma. This had both adenocarcinoma and squamous differentiation. The patient therefore has a new diagnosis of metastatic non-small cell lung  cancer.  The patient at this time denies any substantial difficulties with shortness of breath currently. He denies any headaches or nausea. He has not had any major symptoms in terms of cough. He has lost 10 pounds.   PREVIOUS RADIATION THERAPY: No   PAST MEDICAL HISTORY:  has a past medical history of COPD (chronic obstructive pulmonary disease); Allergy; and Cancer.     PAST SURGICAL HISTORY: Past Surgical History  Procedure Laterality Date  . Cholecystectomy       FAMILY HISTORY: family history includes Cancer in his mother; Stroke in his father.   SOCIAL HISTORY:  reports that he quit smoking about 14 years ago. He has never used smokeless tobacco. He reports that he does not drink alcohol or use illicit drugs.   ALLERGIES: Sulfa antibiotics   MEDICATIONS:  Current Outpatient Prescriptions  Medication Sig Dispense Refill  . acetaminophen (TYLENOL) 500 MG tablet Take 500 mg by mouth every 6 (six) hours as needed.      . clobetasol cream (TEMOVATE) 0.05 % Apply 1 application topically daily as needed (for itching).      Bertram Gala Glycol-Propyl Glycol (SYSTANE OP) Place 1 drop into both eyes daily.      . simvastatin (ZOCOR) 10 MG tablet Take 1 tablet (10 mg total) by mouth at bedtime.  90 tablet  3   No current facility-administered medications for this encounter.   Facility-Administered Medications Ordered in Other Encounters  Medication Dose Route Frequency Provider Last Rate Last Dose  . gadobenate dimeglumine (MULTIHANCE) injection 15 mL  15 mL Intravenous Once PRN Medication Radiologist, MD  REVIEW OF SYSTEMS:  A 15 point review of systems is documented in the electronic medical record. This was obtained by the nursing staff. However, I reviewed this with the patient to discuss relevant findings and make appropriate changes.  Pertinent items are noted in HPI.    PHYSICAL EXAM:  vitals were not taken for this visit.  General: Well-developed, in no acute  distress HEENT: Normocephalic, atraumatic; oral cavity clear Neck: Supple without any lymphadenopathy Cardiovascular: Regular rate and rhythm Respiratory: Clear to auscultation bilaterally.   GI: Soft, nontender, normal bowel sounds Extremities: No edema present Neuro: No focal deficits     LABORATORY DATA:  Lab Results  Component Value Date   WBC 5.8 11/24/2012   HGB 13.5 11/24/2012   HCT 38.0* 11/24/2012   MCV 88.6 11/24/2012   PLT 173 11/24/2012   Lab Results  Component Value Date   NA CANCELED 10/13/2012   K CANCELED 10/13/2012   CL CANCELED 10/13/2012   CO2 CANCELED 10/13/2012   Lab Results  Component Value Date   ALT CANCELED 10/13/2012   AST CANCELED 10/13/2012   ALKPHOS CANCELED 10/13/2012   BILITOT CANCELED 10/13/2012      RADIOGRAPHY: Mr Brain W Wo Contrast  12/02/2012   CLINICAL DATA:  Metastatic lung cancer.  EXAM: MRI HEAD WITHOUT AND WITH CONTRAST  TECHNIQUE: Multiplanar, multiecho pulse sequences of the brain and surrounding structures were obtained without and with intravenous contrast. SRS protocol.  CONTRAST:  15 cc MultiHance  COMPARISON:  11/10/2012 brain MRI  FINDINGS: There is mild age-related cerebral volume loss. Again seen are scattered foci of T2 hyperintensity within the subcortical and deep cerebral white matter bilaterally, compatible with moderate chronic small vessel ischemic disease. Ring-enhancing metastasis in the medial right cerebellum measures 13 x 11 mm (series 10, image 45). Ring-enhancing lesion in the right post central gyrus and measures 8 x 7 mm (series 10, image 114). Ring-enhancing lesion in the medial left occipital lobe measures 7 x 5 mm (series 10, image 73). Allowing for differences in technique, these lesions do not appear significantly changed in size compared to recent routine brain MRI. There is very mild vasogenic edema associated with these lesions without mass effect. No new lesions are identified. There is no evidence of  intracranial hemorrhage, midline shift, or extra-axial fluid collection. Major intracranial flow voids are unremarkable. Orbits and paranasal sinuses are unremarkable.  IMPRESSION: Unchanged appearance of 3 ring enhancing brain lesions, consistent with metastases. No new lesions identified.   Electronically Signed   By: Sebastian Ache   On: 12/02/2012 15:44   Mr Brain W Wo Contrast  11/10/2012   CLINICAL DATA:  New diagnosis lung cancer with staging.  Weakness.  EXAM: MRI HEAD WITHOUT AND WITH CONTRAST  TECHNIQUE: Multiplanar, multiecho pulse sequences of the brain and surrounding structures were obtained according to standard protocol without and with intravenous contrast  CONTRAST:  15mL MULTIHANCE GADOBENATE DIMEGLUMINE 529 MG/ML IV SOLN  COMPARISON:  PET scan performed 11/04/2012.  FINDINGS: No evidence for acute infarction, hemorrhage, hydrocephalus, or extra-axial fluid. Moderate atrophy is present. There is extensive chronic microvascular ischemic change in the periventricular and subcortical white matter.  Post infusion, there is demonstration of 3 intracranial metastatic deposits. In the right medial cerebellum there is an 8 x 10 mm lesion with central necrosis. In the left medial occipital lobe there is a 4 x 6 mm lesion with slight central necrosis. In the right posterior frontal cortex there is a roughly spherical 5 mm  lesion with central necrosis.  No meningeal enhancement. Flow voids are maintained. No definite osseous lesions. No acute sinus disease. No mastoid fluid. Negative orbits.  IMPRESSION: At least 3 intracranial metastatic deposits are detected. 3 Tesla MRI is more sensitive in the detection of intracranial metastatic disease in should be considered as an outpatient prior to definitive treatment.  Moderate atrophy with chronic microvascular ischemic change.  These results will be called to the ordering clinician or representative by the Radiologist Assistant, and communication documented in  the PACS Dashboard.   Electronically Signed   By: Davonna Belling M.D.   On: 11/10/2012 18:23   Nm Pet Image Initial (pi) Skull Base To Thigh  11/04/2012   CLINICAL DATA:  INITIAL treatment strategy for RIGHT LOWER LOBE LUNG MASS.  EXAM: NUCLEAR MEDICINE PET SKULL BASE TO THIGH  FASTING BLOOD GLUCOSE:  Value:  82 mg/dl  TECHNIQUE: 16.1 mCi W-96 FDG was injected intravenously. CT data was obtained and used for attenuation correction and anatomic localization only. (This was not acquired as a diagnostic CT examination.) Additional exam technical data entered on technologist worksheet.  COMPARISON:  None.  FINDINGS: NECK  NO HYPERMETABOLIC LYMPH NODES WITHIN THE NECK.  CHEST  POSTERIOR MEDIAL RIGHT LOWER LOBE LUNG MASS MEASURES 6.4 X 4.1 X 5.2 CM. THERE IS INTENSE FDG UPTAKE ASSOCIATED WITH THIS MASS WITHIN SUV MAX EQUAL TO 19.5. THERE IS A HYPERMETABOLIC PARA ESOPHAGEAL LYMPH NODE WHICH MEASURES 1.2 CM AND HAS AN SUV MAX EQUAL TO 7.4 NO CONTRALATERAL HYPERMETABOLIC MEDIASTINAL OR HILAR LYMPH NODES IDENTIFIED.  THERE IS NO HYPERMETABOLIC SUPRACLAVICULAR OR AXILLARY LYMPH NODES.  ABDOMEN/PELVIS  NO ABNORMAL HYPERMETABOLIC ACTIVITY WITHIN THE LIVER PANCREAS ADRENAL GLANDS OR SPLEEN. THERE IS NO HYPERMETABOLIC UPPER ABDOMINAL OR PELVIC ADENOPATHY.  SKELETON  NO FOCAL HYPERMETABOLIC ACTIVITY TO SUGGEST SKELETAL METASTASIS.  IMPRESSION: 1. LARGE, NECROTIC MASS WITHIN THE RIGHT LOWER LOBE EXHIBITS MALIGNANT RANGE FDG UPTAKE AND IS COMPATIBLE WITH PRIMARY LUNG NEOPLASM.  2. HYPERMETABOLIC PARAESOPHAGEAL LYMPH NODE COMPATIBLE WITH IPSILATERAL MEDIASTINAL LYMPH NODE METASTASIS.  3. NO EVIDENCE FOR CONTRALATERAL MEDIASTINAL OR HILAR HYPERMETABOLIC ADENOPATHY. NO EVIDENCE FOR HYPERMETABOLIC DISTANT METASTATIC DISEASE.   Electronically Signed   By: Signa Kell M.D.   On: 11/04/2012 15:15   Ct Biopsy  11/24/2012   CLINICAL DATA:  Hypermetabolic cavitary right lower lobe lung lesion.  EXAM: CT GUIDED CORE BIOPSY OF RIGHT  LOWER LOBE LUNG LESION  ANESTHESIA/SEDATION: Intravenous Fentanyl and Versed were administered as conscious sedation during continuous cardiorespiratory monitoring by the radiology RN, with a total moderate sedation time of 16 minutes.  PROCEDURE: The procedure risks, benefits, and alternatives were explained to the patient. Questions regarding the procedure were encouraged and answered. The patient understands and consents to the procedure. Patient placed in right lateral decubitus position. Limited axial scans obtained through the thorax. An appropriate skin entry site was determined.  The posterior thorax was prepped with Betadinein a sterile fashion, and a sterile drape was applied covering the operative field. A sterile gown and sterile gloves were used for the procedure. Local anesthesia was provided with 1% Lidocaine. Under CT fluoroscopic guidance, a 17 gauge trocar needle was advanced to the margin of the lesion. Once needle tip position was confirmed, coaxial 18-gauge core biopsy samples were obtained, submitted in formalin to surgical pathology. The guide needle was removed. Postprocedure scans show no pneumothorax or other apparent complication. The patient tolerated the procedure well.  Complications: None  : IMPRESSION:  Technically successful CT-guided core biopsy of right  lower lobe lung lesion. Surveillance and post procedure radiography scheduled.   Electronically Signed   By: Oley Balm M.D.   On: 11/24/2012 10:46   Dg Chest Port 1 View  11/24/2012   CLINICAL DATA:  Right lung biopsy.  EXAM: PORTABLE CHEST - 1 VIEW  COMPARISON:  CT biopsy images 11 10/2012.  Chest x-ray 10/13/2012.  FINDINGS: Mediastinum is normal. Heart size normal. No pneumothorax. Changes of COPD. Infiltrative changes of the right lower lobe. Hemidiaphragms are not completely imaged. Next feel  IMPRESSION: No evidence of pneumothorax post biopsy.   Electronically Signed   By: Maisie Fus  Register   On: 11/24/2012 11:17        IMPRESSION: The patient has a new diagnosis of metastatic lung cancer with brain metastases. He has 3 small intracranial lesion seen on his current MRI scan of the brain. The patient had his case discussed in multidisciplinary thoracic conference this morning. He likely would benefit from a course of stereotactic radiosurgery to the intracranial disease. He also  I believe would benefit from a palliative course of thoracic radiotherapy which can be given while additional information is obtained on the primary tumor itself.  I therefore discussed with the patient the rationale and potential benefit of a course of stereotactic radiosurgery. We discussed the benefit of treatment which is possible in addition to the side effects and risks. In particular we discussed the possibility of radiation necrosis. We also discussed a possible palliative course of thoracic radiation treatment as well, including the possible benefit in terms of local control and prevention of worsening chest symptoms. We also discussed the side effects and risks of such a treatment, including esophagitis. All the patient's questions were answered. He does wish to proceed with this overall treatment plan. Again he is seeing Dr. Shirline Frees from medical oncology today to discuss systemic treatment options and additional testing.   PLAN: The patient will proceed with a simulation 4 for radiosurgery for the intracranial disease as well is a palliative course of thoracic radiation treatment. We will obtain a SRS protocol MRI scan for treatment planning purposes in the near future.    I spent 60 minutes face to face with the patient and more than 50% of that time was spent in counseling and/or coordination of care.    ________________________________   Radene Gunning, MD, PhD

## 2012-12-03 ENCOUNTER — Encounter: Payer: Self-pay | Admitting: Radiation Oncology

## 2012-12-03 ENCOUNTER — Ambulatory Visit
Admission: RE | Admit: 2012-12-03 | Discharge: 2012-12-03 | Disposition: A | Discharge status: Home / Self Care | Payer: Medicare Other | Source: Ambulatory Visit | Attending: Radiation Oncology | Admitting: Radiation Oncology

## 2012-12-03 DIAGNOSIS — C3490 Malignant neoplasm of unspecified part of unspecified bronchus or lung: Secondary | ICD-10-CM | POA: Insufficient documentation

## 2012-12-03 DIAGNOSIS — C7931 Secondary malignant neoplasm of brain: Secondary | ICD-10-CM | POA: Insufficient documentation

## 2012-12-03 DIAGNOSIS — Z510 Encounter for antineoplastic radiation therapy: Secondary | ICD-10-CM | POA: Insufficient documentation

## 2012-12-03 DIAGNOSIS — Z79899 Other long term (current) drug therapy: Secondary | ICD-10-CM | POA: Insufficient documentation

## 2012-12-03 MED ORDER — SODIUM CHLORIDE 0.9 % IJ SOLN
10.0000 mL | Freq: Once | INTRAMUSCULAR | Status: AC
  Administered 2012-12-03: 10 mL via INTRAVENOUS

## 2012-12-03 NOTE — Progress Notes (Signed)
Patient arrived, gave name and dob as identification, not allergy to IV dye, or diabetic, BUN=14.8 & cr=0.8 on 12/02/12, no c/o pain except mid right back, took tylenol this am resolved, Iv catheter started x 2 attempts, first unsuccessful left fa above wrist, , succss on LAC, #22g 1 inch, excellent blood return, flushed with 10ml Normal saline, patient tolerated well. Called Ct sim pateint ready 10:09 AM

## 2012-12-08 ENCOUNTER — Encounter: Payer: Self-pay | Admitting: Radiation Oncology

## 2012-12-08 ENCOUNTER — Ambulatory Visit
Admission: RE | Admit: 2012-12-08 | Discharge: 2012-12-08 | Disposition: A | Discharge status: Home / Self Care | Payer: Medicare Other | Source: Ambulatory Visit | Attending: Radiation Oncology | Admitting: Radiation Oncology

## 2012-12-08 VITALS — BP 154/87 | HR 80 | Temp 97.7°F | Resp 20

## 2012-12-08 DIAGNOSIS — C7931 Secondary malignant neoplasm of brain: Secondary | ICD-10-CM

## 2012-12-08 NOTE — Progress Notes (Signed)
  Radiation Oncology         [phone] ________________________________  Name: [name] MRN: [id number]  Date: 12/08/2012  DOB: 05-Dec-1933   SPECIAL TREATMENT PROCEDURE   3D TREATMENT PLANNING AND DOSIMETRY: The patient's radiation plan was reviewed and approved by Dr. Venetia Maxon from neurosurgery and radiation oncology prior to treatment. It showed 3-dimensional radiation distributions overlaid onto the planning CT/MRI image set. The [facility] for the target structures as well as the organs at risk were reviewed. The documentation of the 3D plan and dosimetry are filed in the radiation oncology EMR.   NARRATIVE: The patient was brought to the TrueBeam stereotactic radiation treatment machine and placed supine on the CT couch. The head frame was applied, and the patient was set up for stereotactic radiosurgery. Neurosurgery was present for the set-up and delivery   SIMULATION VERIFICATION: In the couch zero-angle position, the patient underwent Exactrac imaging using the Brainlab system with orthogonal KV images. These were carefully aligned and repeated to confirm treatment position for each of the isocenters. The Exactrac snap film verification was repeated at each couch angle.   SPECIAL TREATMENT PROCEDURE: The patient received stereotactic radiosurgery to the following targets:  1.  PTV1 Rt Cerebellum 11mm target was treated using 3 dynamic conformal Arcs to a prescription dose of 20 Gy. ExacTrac Snap verification was performed for each couch angle.  2.  PTV2 Lt Occip 7mm target was treated using 3 dynamic conformal Arcs to a prescription dose of 20 Gy. ExacTrac Snap verification was performed for each couch angle.  3.  PTV3 Rt Gyrus 8mm target was treated using 2 dynamic conformal Arcs to a prescription dose of 20 Gy. ExacTrac Snap verification was performed for each couch angle.   STEREOTACTIC TREATMENT MANAGEMENT: Following delivery, the patient was transported to nursing in stable  condition and monitored for possible acute effects. Vital signs were recorded . The patient tolerated treatment without significant acute effects, and was discharged to home in stable condition.  PLAN: Follow-up in one month.   ------------------------------------------------  Radene Gunning, MD, PhD

## 2012-12-08 NOTE — Progress Notes (Signed)
[name] completed SRS treament to the brain at 0900.  He is lying on exam room table presently with his family in attendance.  He denies any blurred vision, headaches, nor N/V.  Drinking gingerale a this time.  Required to stay 1 hour post procedure.

## 2012-12-08 NOTE — Progress Notes (Signed)
1 hour  SRS Brain tx  obs completed, no c/o nausea,blurred vision, or head ache, finished drinking sprite, d/c home ambulatory, to go home and rest today no raking leaves,pateint ambulated steady gait home with son and wife at side ,denies any pain either 10:03 AM

## 2012-12-08 NOTE — Progress Notes (Signed)
  Radiation Oncology         [phone] ________________________________  Name: [name] MRN: [id number]  Date: 12/03/2012  DOB: 1933/12/06  SIMULATION AND TREATMENT PLANNING NOTE  DIAGNOSIS:  Lung cancer with brain metastasis  NARRATIVE:  The patient was brought to the CT Simulation planning suite.  Identity was confirmed.  All relevant records and images related to the planned course of therapy were reviewed.  The patient freely provided informed written consent to proceed with treatment after reviewing the details related to the planned course of therapy. The consent form was witnessed and verified by the simulation staff. Intravenous access was established for contrast administration. Then, the patient was set-up in a stable reproducible supine position for radiation therapy.  A relocatable thermoplastic stereotactic head frame was fabricated for precise immobilization.  CT images were obtained.  Surface markings were placed.  The CT images were loaded into the planning software and fused with the patient's targeting MRI scan.  Then the target and avoidance structures were contoured.  Treatment planning then occurred.  The radiation prescription was entered and confirmed.  I have requested 3D planning  I have requested a DVH of the following structures: Brain stem, brain, left eye, right I, lenses, optic chiasm, target volumes, uninvolved brain, and normal tissue.    PLAN:  The patient will receive 20 Gy in 1 fraction to 3 intracranial lesions.  ________________________________  Radene Gunning, MD, PhD

## 2012-12-08 NOTE — Progress Notes (Signed)
  Radiation Oncology         [phone] ________________________________  Name: [name] MRN: [id number]  Date: 12/08/2012  DOB: 06-25-1933  End of Treatment Note  Diagnosis:   Metastatic lung cancer with brain metastasis     Indication for treatment:  palliative       Radiation treatment dates:   12/08/12  Site/dose:    1.  PTV1 Rt Cerebellum 11mm target was treated using 3 dynamic conformal Arcs to a prescription dose of 20 Gy. ExacTrac Snap verification was performed for each couch angle.  2.  PTV2 Lt Occip 7mm target was treated using 3 dynamic conformal Arcs to a prescription dose of 20 Gy. ExacTrac Snap verification was performed for each couch angle.  3.  PTV3 Rt Gyrus 8mm target was treated using 2 dynamic conformal Arcs to a prescription dose of 20 Gy. ExacTrac Snap verification was performed for each couch angle.   Narrative: The patient tolerated radiation treatment relatively well.   He did not exhibit any acute toxicity.  Plan: The patient has completed radiation treatment. The patient will proceed with additional palliative radiation treatment tomorrow.  ________________________________  Radene Gunning, M.D., Ph.D.

## 2012-12-09 ENCOUNTER — Encounter: Payer: Self-pay | Admitting: Radiation Oncology

## 2012-12-09 ENCOUNTER — Ambulatory Visit
Admission: RE | Admit: 2012-12-09 | Discharge: 2012-12-09 | Disposition: A | Discharge status: Home / Self Care | Payer: Medicare Other | Source: Ambulatory Visit | Attending: Radiation Oncology | Admitting: Radiation Oncology

## 2012-12-09 NOTE — Progress Notes (Signed)
Simulation verification note: The patient underwent simulation verification for treatment to his chest. His isocenter is in good position and the multileaf collimators contoured the treatment volume appropriately.

## 2012-12-09 NOTE — Progress Notes (Signed)
I met with patient and his wife today.  I explained the radiation oncology billing process and gave patient a cost estimate for both his SRS and external beam.  I also went over the patient's deductible and coinsurance.

## 2012-12-09 NOTE — Op Note (Signed)
Name: OLUWASEMILORE BAHL MRN: [id number]  Date: 12/08/2012 DOB: 1933/06/30  SPECIAL TREATMENT PROCEDURE  3D TREATMENT PLANNING AND DOSIMETRY: The patient's radiation plan was reviewed and approved by Dr. Venetia Maxon from neurosurgery and Dr. Mitzi Hansen from radiation oncology prior to treatment. It showed 3-dimensional radiation distributions overlaid onto the planning CT/MRI image set. The [facility] for the target structures as well as the organs at risk were reviewed. The documentation of the 3D plan and dosimetry are filed in the radiation oncology EMR.  NARRATIVE: The patient was brought to the TrueBeam stereotactic radiation treatment machine and placed supine on the CT couch. The head frame was applied, and the patient was set up for stereotactic radiosurgery. Neurosurgery and Radiation Oncology were present for the set-up and delivery  SIMULATION VERIFICATION: In the couch zero-angle position, the patient underwent Exactrac imaging using the Brainlab system with orthogonal KV images. These were carefully aligned and repeated to confirm treatment position for each of the isocenters. The Exactrac snap film verification was repeated at each couch angle.  SPECIAL TREATMENT PROCEDURE: The patient received stereotactic radiosurgery to the following targets:  1. PTV1 Rt Cerebellum 11mm target was treated using 3 dynamic conformal Arcs to a prescription dose of 20 Gy. ExacTrac Snap verification was performed for each couch angle.  2. PTV2 Lt Occip 7mm target was treated using 3 dynamic conformal Arcs to a prescription dose of 20 Gy. ExacTrac Snap verification was performed for each couch angle.  3. PTV3 Rt Gyrus 8mm target was treated using 2 dynamic conformal Arcs to a prescription dose of 20 Gy. ExacTrac Snap verification was performed for each couch angle.  STEREOTACTIC TREATMENT MANAGEMENT: Following delivery, the patient was transported to nursing in stable condition and monitored for possible acute effects. Vital signs  were recorded . The patient tolerated treatment without significant acute effects, and was discharged to home in stable condition.  PLAN: Follow-up in one month.  ------------------------------------------------   Danae Orleans. Venetia Maxon, MD

## 2012-12-10 ENCOUNTER — Ambulatory Visit
Admission: RE | Admit: 2012-12-10 | Discharge: 2012-12-10 | Disposition: A | Discharge status: Home / Self Care | Payer: Medicare Other | Source: Ambulatory Visit | Attending: Radiation Oncology | Admitting: Radiation Oncology

## 2012-12-15 ENCOUNTER — Ambulatory Visit
Admission: RE | Admit: 2012-12-15 | Discharge: 2012-12-15 | Disposition: A | Discharge status: Home / Self Care | Payer: Medicare Other | Source: Ambulatory Visit | Attending: Radiation Oncology | Admitting: Radiation Oncology

## 2012-12-16 ENCOUNTER — Ambulatory Visit: Admission: RE | Admit: 2012-12-16 | Payer: Medicare Other | Source: Ambulatory Visit

## 2012-12-17 ENCOUNTER — Ambulatory Visit
Admission: RE | Admit: 2012-12-17 | Discharge: 2012-12-17 | Disposition: A | Discharge status: Home / Self Care | Payer: Medicare Other | Source: Ambulatory Visit | Attending: Radiation Oncology | Admitting: Radiation Oncology

## 2012-12-18 ENCOUNTER — Other Ambulatory Visit (HOSPITAL_COMMUNITY)
Admission: RE | Admit: 2012-12-18 | Discharge: 2012-12-18 | Disposition: A | Discharge status: Home / Self Care | Payer: Medicare Other | Source: Ambulatory Visit | Attending: Internal Medicine | Admitting: Internal Medicine

## 2012-12-18 ENCOUNTER — Encounter: Payer: Self-pay | Admitting: *Deleted

## 2012-12-18 ENCOUNTER — Telehealth: Payer: Self-pay | Admitting: Internal Medicine

## 2012-12-18 ENCOUNTER — Ambulatory Visit (HOSPITAL_BASED_OUTPATIENT_CLINIC_OR_DEPARTMENT_OTHER): Payer: Medicare Other

## 2012-12-18 ENCOUNTER — Encounter: Payer: Self-pay | Admitting: Internal Medicine

## 2012-12-18 ENCOUNTER — Ambulatory Visit
Admission: RE | Admit: 2012-12-18 | Discharge: 2012-12-18 | Disposition: A | Discharge status: Home / Self Care | Payer: Medicare Other | Source: Ambulatory Visit | Attending: Radiation Oncology | Admitting: Radiation Oncology

## 2012-12-18 ENCOUNTER — Other Ambulatory Visit: Payer: Medicare Other

## 2012-12-18 ENCOUNTER — Telehealth: Payer: Self-pay | Admitting: *Deleted

## 2012-12-18 ENCOUNTER — Encounter: Payer: Self-pay | Admitting: Radiation Oncology

## 2012-12-18 ENCOUNTER — Ambulatory Visit (HOSPITAL_BASED_OUTPATIENT_CLINIC_OR_DEPARTMENT_OTHER): Payer: Medicare Other | Admitting: Internal Medicine

## 2012-12-18 ENCOUNTER — Other Ambulatory Visit (HOSPITAL_BASED_OUTPATIENT_CLINIC_OR_DEPARTMENT_OTHER): Payer: Medicare Other | Admitting: Lab

## 2012-12-18 DIAGNOSIS — C3430 Malignant neoplasm of lower lobe, unspecified bronchus or lung: Secondary | ICD-10-CM

## 2012-12-18 DIAGNOSIS — C3490 Malignant neoplasm of unspecified part of unspecified bronchus or lung: Secondary | ICD-10-CM | POA: Insufficient documentation

## 2012-12-18 DIAGNOSIS — C7931 Secondary malignant neoplasm of brain: Secondary | ICD-10-CM

## 2012-12-18 DIAGNOSIS — J449 Chronic obstructive pulmonary disease, unspecified: Secondary | ICD-10-CM

## 2012-12-18 LAB — CBC WITH DIFFERENTIAL/PLATELET
BASO%: 1 % (ref 0.0–2.0)
EOS%: 0.8 % (ref 0.0–7.0)
Eosinophils Absolute: 0.1 10*3/uL (ref 0.0–0.5)
HCT: 40.4 % (ref 38.4–49.9)
MCH: 31.3 pg (ref 27.2–33.4)
MCHC: 33.5 g/dL (ref 32.0–36.0)
MONO#: 0.6 10*3/uL (ref 0.1–0.9)
MONO%: 8.9 % (ref 0.0–14.0)
NEUT#: 4.6 10*3/uL (ref 1.5–6.5)
Platelets: 187 10*3/uL (ref 140–400)
RBC: 4.34 10*6/uL (ref 4.20–5.82)
RDW: 13.3 % (ref 11.0–14.6)
WBC: 6.2 10*3/uL (ref 4.0–10.3)
lymph#: 1 10*3/uL (ref 0.9–3.3)

## 2012-12-18 LAB — COMPREHENSIVE METABOLIC PANEL (CC13)
ALT: 16 U/L (ref 0–55)
Albumin: 3.8 g/dL (ref 3.5–5.0)
Anion Gap: 11 mEq/L (ref 3–11)
CO2: 28 mEq/L (ref 22–29)
Calcium: 9.5 mg/dL (ref 8.4–10.4)
Chloride: 101 mEq/L (ref 98–109)
Creatinine: 0.8 mg/dL (ref 0.7–1.3)
Glucose: 98 mg/dl (ref 70–140)
Potassium: 4.3 mEq/L (ref 3.5–5.1)
Sodium: 140 mEq/L (ref 136–145)
Total Bilirubin: 0.87 mg/dL (ref 0.20–1.20)
Total Protein: 7.5 g/dL (ref 6.4–8.3)

## 2012-12-18 MED ORDER — SUCRALFATE 1 G PO TABS: 1.0000 g | ORAL_TABLET | Freq: Four times a day (QID) | ORAL | Status: DC

## 2012-12-18 MED ORDER — CYANOCOBALAMIN 1000 MCG/ML IJ SOLN
1000.0000 ug | Freq: Once | INTRAMUSCULAR | Status: AC
  Administered 2012-12-18: 1000 ug via INTRAMUSCULAR

## 2012-12-18 NOTE — Progress Notes (Signed)
Called [facility].  Faxed request for [facility] per Dr. Asa Lente request

## 2012-12-18 NOTE — Progress Notes (Signed)
[facility] [phone]   [phone]  OFFICE PROGRESS NOTE  Rudi Heap, MD [address] [place] [address]  DIAGNOSIS: Stage IV ( T2b, N2, M1b) non-small cell lung cancer, adenocarcinoma with areas of squamous differentiation presented with left lower lobe lung mass in addition to mediastinal lymphadenopathy and brain metastases diagnosed in October of 2014  PRIOR THERAPY:Stereotactic radiotherapy to 3 brain lesions under the care of Dr. Mitzi Hansen completed 12/08/2012.  CURRENT THERAPY: 1) palliative radiotherapy to the left lower lobe lung mass under the care of Dr. Mitzi Hansen expected to be completed 01/01/2013. 2) systemic chemotherapy with carboplatin for AUC of 5 and Alimta 500 mg/M2 every 3 weeks. First dose 12/23/2012.   CHEMOTHERAPY INTENT:Palliative  CURRENT # OF CHEMOTHERAPY CYCLES: 1  CURRENT ANTIEMETICS:Zofran, dexamethasone and Compazine  CURRENT SMOKING STATUS: former smoker  ORAL CHEMOTHERAPY AND CONSENT: None  CURRENT BISPHOSPHONATES USE: None  PAIN MANAGEMENT: 0/10  NARCOTICS INDUCED CONSTIPATION: None  LIVING WILL AND CODE STATUS: ?  INTERVAL HISTORY: [name] 79 y.o. male returns to the clinic today for follow up visit accompanied by his wife. The patient is feeling fine today with no specific complaints. He tolerated the stereotactic radiotherapy to the brain lesions fairly well. The patient denied having any nausea or vomiting. He denied having any fever or chills. He has no chest pain, shortness breath, cough or hemoptysis. He has no weight loss or night sweats. He is undergoing palliative radiotherapy to the left lower lobe lung mass and expected to complete this course on 01/01/2013. The molecular biomarkers are still pending. He is here today for evaluation and discussion of his treatment options.    MEDICAL HISTORY: Past Medical History  Diagnosis Date  . COPD (chronic obstructive pulmonary disease)   .  Allergy     allergic rhinitis  . Cancer     Patient has a lung mass which is being evaluated    ALLERGIES:  is allergic to sulfa antibiotics.  MEDICATIONS:  Current Outpatient Prescriptions  Medication Sig Dispense Refill  . acetaminophen (TYLENOL) 500 MG tablet Take 500 mg by mouth every 6 (six) hours as needed.      . clobetasol cream (TEMOVATE) 0.05 % Apply 1 application topically daily as needed (for itching).      Bertram Gala Glycol-Propyl Glycol (SYSTANE OP) Place 1 drop into both eyes daily.      . simvastatin (ZOCOR) 10 MG tablet Take 1 tablet (10 mg total) by mouth at bedtime.  90 tablet  3   No current facility-administered medications for this visit.    SURGICAL HISTORY:  Past Surgical History  Procedure Laterality Date  . Cholecystectomy      REVIEW OF SYSTEMS:  Constitutional: negative Eyes: negative Ears, nose, mouth, throat, and face: negative Respiratory: negative Cardiovascular: negative Gastrointestinal: negative Genitourinary:negative Integument/breast: negative Hematologic/lymphatic: negative Musculoskeletal:negative Neurological: negative Behavioral/Psych: negative Endocrine: negative Allergic/Immunologic: negative   PHYSICAL EXAMINATION: General appearance: alert, cooperative and no distress Head: Normocephalic, without obvious abnormality, atraumatic Neck: no adenopathy, no JVD, supple, symmetrical, trachea midline and thyroid not enlarged, symmetric, no tenderness/mass/nodules Lymph nodes: Cervical, supraclavicular, and axillary nodes normal. Resp: clear to auscultation bilaterally Back: symmetric, no curvature. ROM normal. No CVA tenderness. Cardio: regular rate and rhythm, S1, S2 normal, no murmur, click, rub or gallop GI: soft, non-tender; bowel sounds normal; no masses,  no organomegaly Extremities: extremities normal, atraumatic, no cyanosis or edema Neurologic: Alert and oriented X 3, normal strength and  tone. Normal symmetric reflexes.  Normal coordination and gait  ECOG PERFORMANCE STATUS: 1 - Symptomatic but completely ambulatory  Blood pressure 150/87, pulse 67, temperature 97.5 F (36.4 C), temperature source Oral, resp. rate 18, height 5\' 9"  (1.753 m), weight 165 lb 8 oz (75.07 kg), SpO2 99.00%.  LABORATORY DATA: Lab Results  Component Value Date   WBC 6.2 12/18/2012   HGB 13.6 12/18/2012   HCT 40.4 12/18/2012   MCV 93.2 12/18/2012   PLT 187 12/18/2012      Chemistry      Component Value Date/Time   NA 140 12/18/2012 0954   NA CANCELED 10/13/2012 1149   NA 138 06/13/2007 0420   K 4.3 12/18/2012 0954   K CANCELED 10/13/2012 1149   CL CANCELED 10/13/2012 1149   CO2 28 12/18/2012 0954   CO2 CANCELED 10/13/2012 1149   BUN 12.7 12/18/2012 0954   BUN CANCELED 10/13/2012 1149   BUN 10 06/13/2007 0420   CREATININE 0.8 12/18/2012 0954   CREATININE 1.00 11/10/2012 1431      Component Value Date/Time   CALCIUM 9.5 12/18/2012 0954   CALCIUM CANCELED 10/13/2012 1149   ALKPHOS 140 12/18/2012 0954   ALKPHOS CANCELED 10/13/2012 1149   AST 18 12/18/2012 0954   AST CANCELED 10/13/2012 1149   ALT 16 12/18/2012 0954   ALT CANCELED 10/13/2012 1149   BILITOT 0.87 12/18/2012 0954   BILITOT CANCELED 10/13/2012 1149       RADIOGRAPHIC STUDIES: Mr Laqueta Jean Wo Contrast  12/17/2012   CLINICAL DATA:  Metastatic lung cancer.  EXAM: MRI HEAD WITHOUT AND WITH CONTRAST  TECHNIQUE: Multiplanar, multiecho pulse sequences of the brain and surrounding structures were obtained without and with intravenous contrast. SRS protocol.  CONTRAST:  15 cc MultiHance  COMPARISON:  11/10/2012 brain MRI  FINDINGS: There is mild age-related cerebral volume loss. Again seen are scattered foci of T2 hyperintensity within the subcortical and deep cerebral white matter bilaterally, compatible with moderate chronic small vessel ischemic disease. Ring-enhancing metastasis in the medial right cerebellum measures 13 x 11 mm (series 10, image 45). Ring-enhancing lesion in the  right post central gyrus and measures 8 x 7 mm (series 10, image 114). Ring-enhancing lesion in the medial left occipital lobe measures 7 x 5 mm (series 10, image 73). Allowing for differences in technique, these lesions do not appear significantly changed in size compared to recent routine brain MRI. There is very mild vasogenic edema associated with these lesions without mass effect. No new lesions are identified. There is no evidence of intracranial hemorrhage, midline shift, or extra-axial fluid collection. Major intracranial flow voids are unremarkable. Orbits and paranasal sinuses are unremarkable.  IMPRESSION: Unchanged appearance of 3 ring enhancing brain lesions, consistent with metastases. No new lesions identified.   Electronically Signed   By: Sebastian Ache   On: Dec 17, 2012 15:44   Ct Biopsy  11/24/2012   CLINICAL DATA:  Hypermetabolic cavitary right lower lobe lung lesion.  EXAM: CT GUIDED CORE BIOPSY OF RIGHT LOWER LOBE LUNG LESION  ANESTHESIA/SEDATION: Intravenous Fentanyl and Versed were administered as conscious sedation during continuous cardiorespiratory monitoring by the radiology RN, with a total moderate sedation time of 16 minutes.  PROCEDURE: The procedure risks, benefits, and alternatives were explained to the patient. Questions regarding the procedure were encouraged and answered. The patient understands and consents to the procedure. Patient placed in right lateral decubitus position. Limited axial scans obtained through the thorax. An appropriate skin entry site was determined.  The posterior  thorax was prepped with Betadinein a sterile fashion, and a sterile drape was applied covering the operative field. A sterile gown and sterile gloves were used for the procedure. Local anesthesia was provided with 1% Lidocaine. Under CT fluoroscopic guidance, a 17 gauge trocar needle was advanced to the margin of the lesion. Once needle tip position was confirmed, coaxial 18-gauge core biopsy  samples were obtained, submitted in formalin to surgical pathology. The guide needle was removed. Postprocedure scans show no pneumothorax or other apparent complication. The patient tolerated the procedure well.  Complications: None  : IMPRESSION:  Technically successful CT-guided core biopsy of right lower lobe lung lesion. Surveillance and post procedure radiography scheduled.   Electronically Signed   By: Oley Balm M.D.   On: 11/24/2012 10:46   Dg Chest Port 1 View  11/24/2012   CLINICAL DATA:  Right lung biopsy.  EXAM: PORTABLE CHEST - 1 VIEW  COMPARISON:  CT biopsy images 11 10/2012.  Chest x-ray 10/13/2012.  FINDINGS: Mediastinum is normal. Heart size normal. No pneumothorax. Changes of COPD. Infiltrative changes of the right lower lobe. Hemidiaphragms are not completely imaged. Next feel  IMPRESSION: No evidence of pneumothorax post biopsy.   Electronically Signed   By: Maisie Fus  Register   On: 11/24/2012 11:17    ASSESSMENT AND PLAN: this is a very pleasant 79 years old white male with metastatic non-small cell lung cancer, adenocarcinoma with brain metastasis status post stereotactic radiotherapy to the brain lesions and currently undergoing palliative radiotherapy to the left lower lobe lung mass. His molecular biomarkers are still pending.  I have a lengthy discussion with the patient and his wife about his treatment options. I recommended for him to start palliative systemic chemotherapy with carboplatin for AUC of 5 and Alimta 500 mg/M2 every 3 weeks. The patient was also given him the option of palliative care and hospice referral but he would like to consider the chemotherapy. He is expected to start the first cycle of this treatment on 12/23/2012. I discussed with the patient adverse effect of this chemotherapy including but not limited to alopecia, myelosuppression, nausea and vomiting, peripheral neuropathy, liver or renal dysfunction. The patient will receive vitamin B12 injection  today. I will call his pharmacy with prescription for folic acid 1 mg by mouth daily in addition to Compazine 10 mg by mouth every 6 hours as needed for nausea and Decadron 4 mg by mouth twice a day the day before, day of and day after the chemotherapy. The patient will have a chemotherapy education class before starting the first cycle of his treatment. He would come back for follow up visit in 2 weeks for evaluation and management any adverse effect of his treatment.  The patient voices understanding of current disease status and treatment options and is in agreement with the current care plan.  All questions were answered. The patient knows to call the clinic with any problems, questions or concerns. We can certainly see the patient much sooner if necessary.  I spent 20 minutes counseling the patient face to face. The total time spent in the appointment was 30 minutes.

## 2012-12-18 NOTE — Telephone Encounter (Signed)
Per staff message and POF I have scheduled appts.  JMW  

## 2012-12-18 NOTE — Progress Notes (Addendum)
Post sim ed completed w/pt and wife. Gave pt "Radiation and You" booklet with all pertinent information marked and discussed, re: fatigue, skin irritation/care, throat irritation/care, nutrition, pain.  Pt denies pain, fatigue, loss of appetite, cough. He has SOB w/exertion at times. Pt missed chemo education; will call for him and ask that chemo education RN call pt to reschedule. Pt aware. Left vm for Aleta, RN chemo educator requesting she call pt and reschedule. Informed pt and his wife that vm was left.

## 2012-12-18 NOTE — Telephone Encounter (Signed)
per staff message from [place] moved 12/4 ched class to 12/8. s/w pt he is aware.

## 2012-12-18 NOTE — Progress Notes (Signed)
   Department of Radiation Oncology  Phone:  [phone] Fax:        [phone]  Weekly Treatment Note    Name: [name] Date: 12/18/2012 MRN: [id number] DOB: 10-09-1933   Current dose: 12.5 Gy  Current fraction: 5   MEDICATIONS: Current Outpatient Prescriptions  Medication Sig Dispense Refill  . acetaminophen (TYLENOL) 500 MG tablet Take 500 mg by mouth every 6 (six) hours as needed.      . clobetasol cream (TEMOVATE) 0.05 % Apply 1 application topically daily as needed (for itching).      Bertram Gala Glycol-Propyl Glycol (SYSTANE OP) Place 1 drop into both eyes daily.      . simvastatin (ZOCOR) 10 MG tablet Take 1 tablet (10 mg total) by mouth at bedtime.  90 tablet  3   No current facility-administered medications for this encounter.     ALLERGIES: Sulfa antibiotics   LABORATORY DATA:  Lab Results  Component Value Date   WBC 6.2 12/18/2012   HGB 13.6 12/18/2012   HCT 40.4 12/18/2012   MCV 93.2 12/18/2012   PLT 187 12/18/2012   Lab Results  Component Value Date   NA 140 12/18/2012   K 4.3 12/18/2012   CL CANCELED 10/13/2012   CO2 28 12/18/2012   Lab Results  Component Value Date   ALT 16 12/18/2012   AST 18 12/18/2012   ALKPHOS 140 12/18/2012   BILITOT 0.87 12/18/2012     NARRATIVE: [name] was seen today for weekly treatment management. The chart was checked and the patient's films were reviewed. The patient is doing well. She is scheduled to begin chemotherapy next week. No problems with treatment so far. He and his wife had questions about esophagitis which is a possibility during his treatment. We reviewed some of his treatment fields illustrated in this point.  PHYSICAL EXAMINATION: weight is 165 lb 1.6 oz (74.889 kg). His oral temperature is 97.8 F (36.6 C). His blood pressure is 153/89 and his pulse is 80. His respiration is 20 and oxygen saturation is 100%.        ASSESSMENT: The patient is doing satisfactorily with treatment.  PLAN: We will  continue with the patient's radiation treatment as planned. I have written a prescription for Carafate which will be available if needed.

## 2012-12-18 NOTE — Telephone Encounter (Signed)
appts made per 12/4 POF pt could not come on 12/17 per wife so appt made for 12/18 AVS and Dec cal given Email to MW to add txs shh

## 2012-12-19 ENCOUNTER — Ambulatory Visit
Admission: RE | Admit: 2012-12-19 | Discharge: 2012-12-19 | Disposition: A | Discharge status: Home / Self Care | Payer: Medicare Other | Source: Ambulatory Visit | Attending: Radiation Oncology | Admitting: Radiation Oncology

## 2012-12-19 MED ORDER — PROCHLORPERAZINE MALEATE 10 MG PO TABS: 10.0000 mg | ORAL_TABLET | Freq: Four times a day (QID) | ORAL | Status: DC | PRN

## 2012-12-19 MED ORDER — FOLIC ACID 800 MCG PO TABS: 400.0000 ug | ORAL_TABLET | Freq: Every day | ORAL | Status: DC

## 2012-12-19 MED ORDER — DEXAMETHASONE 4 MG PO TABS: ORAL_TABLET | ORAL | Status: DC

## 2012-12-19 NOTE — Patient Instructions (Signed)
CURRENT THERAPY:  1) palliative radiotherapy to the left lower lobe lung mass under the care of Dr. Mitzi Hansen expected to be completed 01/01/2013.  2) systemic chemotherapy with carboplatin for AUC of 5 and Alimta 500 mg/M2 every 3 weeks. First dose 12/23/2012.  CHEMOTHERAPY INTENT:Palliative  CURRENT # OF CHEMOTHERAPY CYCLES: 1  CURRENT ANTIEMETICS:Zofran, dexamethasone and Compazine  CURRENT SMOKING STATUS: former smoker  ORAL CHEMOTHERAPY AND CONSENT: None  CURRENT BISPHOSPHONATES USE: None  PAIN MANAGEMENT: 0/10  NARCOTICS INDUCED CONSTIPATION: None  LIVING WILL AND CODE STATUS: ?

## 2012-12-22 ENCOUNTER — Other Ambulatory Visit: Payer: Self-pay | Admitting: Medical Oncology

## 2012-12-22 ENCOUNTER — Ambulatory Visit
Admission: RE | Admit: 2012-12-22 | Discharge: 2012-12-22 | Disposition: A | Discharge status: Home / Self Care | Payer: Medicare Other | Source: Ambulatory Visit | Attending: Radiation Oncology | Admitting: Radiation Oncology

## 2012-12-22 ENCOUNTER — Other Ambulatory Visit: Payer: Medicare Other

## 2012-12-22 DIAGNOSIS — C7931 Secondary malignant neoplasm of brain: Secondary | ICD-10-CM

## 2012-12-22 NOTE — Telephone Encounter (Signed)
Called in decadron.

## 2012-12-22 NOTE — Progress Notes (Signed)
   Department of Radiation Oncology  Phone:  [phone] Fax:        [phone]  Weekly Treatment Note    Name: [name] Date: 12/22/2012 MRN: [id number] DOB: 01-16-1934   Current fraction: 7   MEDICATIONS: Current Outpatient Prescriptions  Medication Sig Dispense Refill  . acetaminophen (TYLENOL) 500 MG tablet Take 500 mg by mouth every 6 (six) hours as needed.      . clobetasol cream (TEMOVATE) 0.05 % Apply 1 application topically daily as needed (for itching).      Marland Kitchen dexamethasone (DECADRON) 4 MG tablet 4 mg by mouth twice a day the day before, day of and day after the chemotherapy every 3 weeks  40 tablet  1  . folic acid (FOLVITE) 800 MCG tablet Take 0.5 tablets (400 mcg total) by mouth daily.  30 tablet  3  . Polyethyl Glycol-Propyl Glycol (SYSTANE OP) Place 1 drop into both eyes daily.      . prochlorperazine (COMPAZINE) 10 MG tablet Take 1 tablet (10 mg total) by mouth every 6 (six) hours as needed for nausea or vomiting.  60 tablet  0  . simvastatin (ZOCOR) 10 MG tablet Take 1 tablet (10 mg total) by mouth at bedtime.  90 tablet  3  . sucralfate (CARAFATE) 1 G tablet Take 1 tablet (1 g total) by mouth 4 (four) times daily.  120 tablet  2   No current facility-administered medications for this encounter.     ALLERGIES: Sulfa antibiotics   LABORATORY DATA:  Lab Results  Component Value Date   WBC 6.2 12/18/2012   HGB 13.6 12/18/2012   HCT 40.4 12/18/2012   MCV 93.2 12/18/2012   PLT 187 12/18/2012   Lab Results  Component Value Date   NA 140 12/18/2012   K 4.3 12/18/2012   CL CANCELED 10/13/2012   CO2 28 12/18/2012   Lab Results  Component Value Date   ALT 16 12/18/2012   AST 18 12/18/2012   ALKPHOS 140 12/18/2012   BILITOT 0.87 12/18/2012     NARRATIVE: [name] was seen today for weekly treatment management. The chart was checked and the patient's films were reviewed. The patient is doing well. He had a question about Carafate which was answered. The  patient did well over the weekend.  PHYSICAL EXAMINATION:   Alert, in no acute distress.  ASSESSMENT: The patient is doing satisfactorily with treatment.  PLAN: We will continue with the patient's radiation treatment as planned.

## 2012-12-23 ENCOUNTER — Ambulatory Visit
Admission: RE | Admit: 2012-12-23 | Discharge: 2012-12-23 | Disposition: A | Discharge status: Home / Self Care | Payer: Medicare Other | Source: Ambulatory Visit | Attending: Radiation Oncology | Admitting: Radiation Oncology

## 2012-12-23 ENCOUNTER — Ambulatory Visit (HOSPITAL_BASED_OUTPATIENT_CLINIC_OR_DEPARTMENT_OTHER): Payer: Medicare Other

## 2012-12-23 ENCOUNTER — Other Ambulatory Visit (HOSPITAL_BASED_OUTPATIENT_CLINIC_OR_DEPARTMENT_OTHER): Payer: Medicare Other | Admitting: Lab

## 2012-12-23 DIAGNOSIS — C3430 Malignant neoplasm of lower lobe, unspecified bronchus or lung: Secondary | ICD-10-CM

## 2012-12-23 DIAGNOSIS — C7931 Secondary malignant neoplasm of brain: Secondary | ICD-10-CM

## 2012-12-23 DIAGNOSIS — Z5111 Encounter for antineoplastic chemotherapy: Secondary | ICD-10-CM

## 2012-12-23 LAB — COMPREHENSIVE METABOLIC PANEL (CC13)
ALT: 18 U/L (ref 0–55)
AST: 21 U/L (ref 5–34)
Albumin: 3.8 g/dL (ref 3.5–5.0)
Alkaline Phosphatase: 129 U/L (ref 40–150)
BUN: 13.9 mg/dL (ref 7.0–26.0)
Creatinine: 0.8 mg/dL (ref 0.7–1.3)
Potassium: 4.1 mEq/L (ref 3.5–5.1)

## 2012-12-23 LAB — CBC WITH DIFFERENTIAL/PLATELET
BASO%: 0.1 % (ref 0.0–2.0)
EOS%: 0 % (ref 0.0–7.0)
Eosinophils Absolute: 0 10*3/uL (ref 0.0–0.5)
HCT: 37.5 % — ABNORMAL LOW (ref 38.4–49.9)
HGB: 13.2 g/dL (ref 13.0–17.1)
LYMPH%: 3.2 % — ABNORMAL LOW (ref 14.0–49.0)
MCH: 30.9 pg (ref 27.2–33.4)
MCHC: 35.2 g/dL (ref 32.0–36.0)
NEUT#: 11 10*3/uL — ABNORMAL HIGH (ref 1.5–6.5)
NEUT%: 93.5 % — ABNORMAL HIGH (ref 39.0–75.0)
Platelets: 181 10*3/uL (ref 140–400)
RBC: 4.27 10*6/uL (ref 4.20–5.82)
WBC: 11.8 10*3/uL — ABNORMAL HIGH (ref 4.0–10.3)
lymph#: 0.4 10*3/uL — ABNORMAL LOW (ref 0.9–3.3)
nRBC: 0 % (ref 0–0)

## 2012-12-23 MED ORDER — SODIUM CHLORIDE 0.9 % IV SOLN
459.0000 mg | Freq: Once | INTRAVENOUS | Status: AC
  Administered 2012-12-23: 460 mg via INTRAVENOUS
  Filled 2012-12-23: qty 46

## 2012-12-23 MED ORDER — DEXAMETHASONE SODIUM PHOSPHATE 20 MG/5ML IJ SOLN
20.0000 mg | Freq: Once | INTRAMUSCULAR | Status: AC
  Administered 2012-12-23: 20 mg via INTRAVENOUS

## 2012-12-23 MED ORDER — ONDANSETRON 16 MG/50ML IVPB (CHCC)
INTRAVENOUS | Status: AC
  Filled 2012-12-23: qty 16

## 2012-12-23 MED ORDER — SODIUM CHLORIDE 0.9 % IV SOLN
500.0000 mg/m2 | Freq: Once | INTRAVENOUS | Status: AC
  Administered 2012-12-23: 950 mg via INTRAVENOUS
  Filled 2012-12-23: qty 38

## 2012-12-23 MED ORDER — ONDANSETRON 16 MG/50ML IVPB (CHCC)
16.0000 mg | Freq: Once | INTRAVENOUS | Status: AC
  Administered 2012-12-23: 16 mg via INTRAVENOUS

## 2012-12-23 MED ORDER — DEXAMETHASONE SODIUM PHOSPHATE 20 MG/5ML IJ SOLN
INTRAMUSCULAR | Status: AC
  Filled 2012-12-23: qty 5

## 2012-12-23 MED ORDER — SODIUM CHLORIDE 0.9 % IV SOLN
Freq: Once | INTRAVENOUS | Status: AC
  Administered 2012-12-23: 12:00:00 via INTRAVENOUS

## 2012-12-23 NOTE — Patient Instructions (Addendum)
[facility] Discharge Instructions for Patients Receiving Chemotherapy  Today you received the following chemotherapy agents: alimta, carboplatin  To help prevent nausea and vomiting after your treatment, we encourage you to take your nausea medication.  Take it as often as prescribed.     If you develop nausea and vomiting that is not controlled by your nausea medication, call the clinic. If it is after clinic hours your family physician or the after hours number for the clinic or go to the Emergency Department.   BELOW ARE SYMPTOMS THAT SHOULD BE REPORTED IMMEDIATELY:  *FEVER GREATER THAN 100.5 F  *CHILLS WITH OR WITHOUT FEVER  NAUSEA AND VOMITING THAT IS NOT CONTROLLED WITH YOUR NAUSEA MEDICATION  *UNUSUAL SHORTNESS OF BREATH  *UNUSUAL BRUISING OR BLEEDING  TENDERNESS IN MOUTH AND THROAT WITH OR WITHOUT PRESENCE OF ULCERS  *URINARY PROBLEMS  *BOWEL PROBLEMS  UNUSUAL RASH Items with * indicate a potential emergency and should be followed up as soon as possible.  One of the nurses will contact you 24 hours after your treatment. Please let the nurse know about any problems that you may have experienced. Feel free to call the clinic you have any questions or concerns. The clinic phone number is [phone].   I have been informed and understand all the instructions given to me. I know to contact the clinic, my physician, or go to the Emergency Department if any problems should occur. I do not have any questions at this time, but understand that I may call the clinic during office hours   should I have any questions or need assistance in obtaining follow up care.    __________________________________________  _____________  __________ Signature of Patient or Authorized Representative            Date                   Time    __________________________________________ Nurse's Signature    Pemetrexed injection (Alimta) What is this medicine? PEMETREXED  (PEM e TREX ed) is a chemotherapy drug. This medicine affects cells that are rapidly growing, such as cancer cells and cells in your mouth and stomach. It is usually used to treat lung cancers like non-small cell lung cancer and mesothelioma. It may also be used to treat other cancers. This medicine may be used for other purposes; ask your health care provider or pharmacist if you have questions. COMMON BRAND NAME(S): Alimta What should I tell my health care provider before I take this medicine? They need to know if you have any of these conditions: -if you frequently drink alcohol containing beverages -infection (especially a virus infection such as chickenpox, cold sores, or herpes) -kidney disease -liver disease -low blood counts, like low platelets, red bloods, or white blood cells -an unusual or allergic reaction to pemetrexed, mannitol, other medicines, foods, dyes, or preservatives -pregnant or trying to get pregnant -breast-feeding How should I use this medicine? This drug is given as an infusion into a vein. It is administered in a hospital or clinic by a specially trained health care professional. Talk to your pediatrician regarding the use of this medicine in children. Special care may be needed. Overdosage: If you think you have taken too much of this medicine contact a poison control center or emergency room at once. NOTE: This medicine is only for you. Do not share this medicine with others. What if I miss a dose? It is important not to miss your dose. Call your   doctor or health care professional if you are unable to keep an appointment. What may interact with this medicine? -aspirin and aspirin-like medicines -medicines to increase blood counts like filgrastim, pegfilgrastim, sargramostim -methotrexate -NSAIDS, medicines for pain and inflammation, like ibuprofen or naproxen -probenecid -pyrimethamine -vaccines Talk to your doctor or health care professional before taking  any of these medicines: -acetaminophen -aspirin -ibuprofen -ketoprofen -naproxen This list may not describe all possible interactions. Give your health care provider a list of all the medicines, herbs, non-prescription drugs, or dietary supplements you use. Also tell them if you smoke, drink alcohol, or use illegal drugs. Some items may interact with your medicine. What should I watch for while using this medicine? Visit your doctor for checks on your progress. This drug may make you feel generally unwell. This is not uncommon, as chemotherapy can affect healthy cells as well as cancer cells. Report any side effects. Continue your course of treatment even though you feel ill unless your doctor tells you to stop. In some cases, you may be given additional medicines to help with side effects. Follow all directions for their use. Call your doctor or health care professional for advice if you get a fever, chills or sore throat, or other symptoms of a cold or flu. Do not treat yourself. This drug decreases your body's ability to fight infections. Try to avoid being around people who are sick. This medicine may increase your risk to bruise or bleed. Call your doctor or health care professional if you notice any unusual bleeding. Be careful brushing and flossing your teeth or using a toothpick because you may get an infection or bleed more easily. If you have any dental work done, tell your dentist you are receiving this medicine. Avoid taking products that contain aspirin, acetaminophen, ibuprofen, naproxen, or ketoprofen unless instructed by your doctor. These medicines may hide a fever. Call your doctor or health care professional if you get diarrhea or mouth sores. Do not treat yourself. To protect your kidneys, drink water or other fluids as directed while you are taking this medicine. Men and women must use effective birth control while taking this medicine. You may also need to continue using  effective birth control for a time after stopping this medicine. Do not become pregnant while taking this medicine. Tell your doctor right away if you think that you or your partner might be pregnant. There is a potential for serious side effects to an unborn child. Talk to your health care professional or pharmacist for more information. Do not breast-feed an infant while taking this medicine. This medicine may lower sperm counts. What side effects may I notice from receiving this medicine? Side effects that you should report to your doctor or health care professional as soon as possible: -allergic reactions like skin rash, itching or hives, swelling of the face, lips, or tongue -low blood counts - this medicine may decrease the number of white blood cells, red blood cells and platelets. You may be at increased risk for infections and bleeding. -signs of infection - fever or chills, cough, sore throat, pain or difficulty passing urine -signs of decreased platelets or bleeding - bruising, pinpoint red spots on the skin, black, tarry stools, blood in the urine -signs of decreased red blood cells - unusually weak or tired, fainting spells, lightheadedness -breathing problems, like a dry cough -changes in emotions or moods -chest pain -confusion -diarrhea -high blood pressure -mouth or throat sores or ulcers -pain, swelling, warmth in the leg -  pain on swallowing -swelling of the ankles, feet, hands -trouble passing urine or change in the amount of urine -vomiting -yellowing of the eyes or skin Side effects that usually do not require medical attention (report to your doctor or health care professional if they continue or are bothersome): -hair loss -loss of appetite -nausea -stomach upset This list may not describe all possible side effects. Call your doctor for medical advice about side effects. You may report side effects to FDA at 1-800-FDA-1088. Where should I keep my medicine? This drug  is given in a hospital or clinic and will not be stored at home. NOTE: This sheet is a summary. It may not cover all possible information. If you have questions about this medicine, talk to your doctor, pharmacist, or health care provider.  2014, Elsevier/Gold Standard. (2007-08-05 13:24:03)   Carboplatin injection What is this medicine? CARBOPLATIN (KAR boe pla tin) is a chemotherapy drug. It targets fast dividing cells, like cancer cells, and causes these cells to die. This medicine is used to treat ovarian cancer and many other cancers. This medicine may be used for other purposes; ask your health care provider or pharmacist if you have questions. COMMON BRAND NAME(S): Paraplatin What should I tell my health care provider before I take this medicine? They need to know if you have any of these conditions: -blood disorders -hearing problems -kidney disease -recent or ongoing radiation therapy -an unusual or allergic reaction to carboplatin, cisplatin, other chemotherapy, other medicines, foods, dyes, or preservatives -pregnant or trying to get pregnant -breast-feeding How should I use this medicine? This drug is usually given as an infusion into a vein. It is administered in a hospital or clinic by a specially trained health care professional. Talk to your pediatrician regarding the use of this medicine in children. Special care may be needed. Overdosage: If you think you have taken too much of this medicine contact a poison control center or emergency room at once. NOTE: This medicine is only for you. Do not share this medicine with others. What if I miss a dose? It is important not to miss a dose. Call your doctor or health care professional if you are unable to keep an appointment. What may interact with this medicine? -medicines for seizures -medicines to increase blood counts like filgrastim, pegfilgrastim, sargramostim -some antibiotics like amikacin, gentamicin, neomycin,  streptomycin, tobramycin -vaccines Talk to your doctor or health care professional before taking any of these medicines: -acetaminophen -aspirin -ibuprofen -ketoprofen -naproxen This list may not describe all possible interactions. Give your health care provider a list of all the medicines, herbs, non-prescription drugs, or dietary supplements you use. Also tell them if you smoke, drink alcohol, or use illegal drugs. Some items may interact with your medicine. What should I watch for while using this medicine? Your condition will be monitored carefully while you are receiving this medicine. You will need important blood work done while you are taking this medicine. This drug may make you feel generally unwell. This is not uncommon, as chemotherapy can affect healthy cells as well as cancer cells. Report any side effects. Continue your course of treatment even though you feel ill unless your doctor tells you to stop. In some cases, you may be given additional medicines to help with side effects. Follow all directions for their use. Call your doctor or health care professional for advice if you get a fever, chills or sore throat, or other symptoms of a cold or flu. Do not treat   yourself. This drug decreases your body's ability to fight infections. Try to avoid being around people who are sick. This medicine may increase your risk to bruise or bleed. Call your doctor or health care professional if you notice any unusual bleeding. Be careful brushing and flossing your teeth or using a toothpick because you may get an infection or bleed more easily. If you have any dental work done, tell your dentist you are receiving this medicine. Avoid taking products that contain aspirin, acetaminophen, ibuprofen, naproxen, or ketoprofen unless instructed by your doctor. These medicines may hide a fever. Do not become pregnant while taking this medicine. Women should inform their doctor if they wish to become  pregnant or think they might be pregnant. There is a potential for serious side effects to an unborn child. Talk to your health care professional or pharmacist for more information. Do not breast-feed an infant while taking this medicine. What side effects may I notice from receiving this medicine? Side effects that you should report to your doctor or health care professional as soon as possible: -allergic reactions like skin rash, itching or hives, swelling of the face, lips, or tongue -signs of infection - fever or chills, cough, sore throat, pain or difficulty passing urine -signs of decreased platelets or bleeding - bruising, pinpoint red spots on the skin, black, tarry stools, nosebleeds -signs of decreased red blood cells - unusually weak or tired, fainting spells, lightheadedness -breathing problems -changes in hearing -changes in vision -chest pain -high blood pressure -low blood counts - This drug may decrease the number of white blood cells, red blood cells and platelets. You may be at increased risk for infections and bleeding. -nausea and vomiting -pain, swelling, redness or irritation at the injection site -pain, tingling, numbness in the hands or feet -problems with balance, talking, walking -trouble passing urine or change in the amount of urine Side effects that usually do not require medical attention (report to your doctor or health care professional if they continue or are bothersome): -hair loss -loss of appetite -metallic taste in the mouth or changes in taste This list may not describe all possible side effects. Call your doctor for medical advice about side effects. You may report side effects to FDA at 1-800-FDA-1088. Where should I keep my medicine? This drug is given in a hospital or clinic and will not be stored at home. NOTE: This sheet is a summary. It may not cover all possible information. If you have questions about this medicine, talk to your doctor,  pharmacist, or health care provider.  2014, Elsevier/Gold Standard. (2007-04-08 14:38:05)  

## 2012-12-24 ENCOUNTER — Telehealth: Payer: Self-pay | Admitting: *Deleted

## 2012-12-24 ENCOUNTER — Ambulatory Visit
Admission: RE | Admit: 2012-12-24 | Discharge: 2012-12-24 | Disposition: A | Discharge status: Home / Self Care | Payer: Medicare Other | Source: Ambulatory Visit | Attending: Radiation Oncology | Admitting: Radiation Oncology

## 2012-12-24 ENCOUNTER — Encounter: Payer: Self-pay | Admitting: *Deleted

## 2012-12-24 NOTE — Progress Notes (Signed)
Received fax from [facility].  Expected results by 01/09/13.  Information placed on Dr. Asa Lente desk

## 2012-12-24 NOTE — Telephone Encounter (Signed)
Called [name] for chemotherapy F/U.  Patient is doing well.  Denies n/v.  Denies any new side effects or symptoms.  Bladder is functioning well.  Bowels ahven't moved and "I usually have more than one movement per day".  Eating and drinking well and I instructed to drink 64 oz minimum daily or at least the day before, of and after treatment.  Questions any information at this time about an oral chemotherapy agent.  "I'd rather take a pill than the IV chemotherapy."  No mention of this found at this time.  Informed that we will contact him if there is any news about an oral agent.  Encouraged to call if needed for any side effects or symptoms.  Reviewed how to call after hours in the case of an emergency.

## 2012-12-24 NOTE — Telephone Encounter (Signed)
Message copied by Augusto Garbe on Wed Dec 24, 2012  2:25 PM ------      Message from: Lorri Frederick      Created: Tue Dec 23, 2012 12:30 PM      Regarding: 1st time chemo      Contact: [phone]       Home: [phone]            Alimta, carboplatin ------

## 2012-12-25 ENCOUNTER — Ambulatory Visit
Admission: RE | Admit: 2012-12-25 | Discharge: 2012-12-25 | Disposition: A | Discharge status: Home / Self Care | Payer: Medicare Other | Source: Ambulatory Visit | Attending: Radiation Oncology | Admitting: Radiation Oncology

## 2012-12-26 ENCOUNTER — Encounter: Payer: Self-pay | Admitting: Radiation Oncology

## 2012-12-26 ENCOUNTER — Ambulatory Visit
Admission: RE | Admit: 2012-12-26 | Discharge: 2012-12-26 | Disposition: A | Discharge status: Home / Self Care | Payer: Medicare Other | Source: Ambulatory Visit | Attending: Radiation Oncology | Admitting: Radiation Oncology

## 2012-12-26 NOTE — Addendum Note (Signed)
Encounter addended by: Jonna Coup, MD on: 12/26/2012  3:56 PM<BR>     Documentation filed: Notes Section, Visit Diagnoses

## 2012-12-26 NOTE — Progress Notes (Signed)
   Department of Radiation Oncology  Phone:  [phone] Fax:        [phone]  Weekly Treatment Note    Name: [name] Date: 12/26/2012 MRN: [id number] DOB: 18-Dec-1933   Current dose: 27.5 Gy  Current fraction: 11   MEDICATIONS: Current Outpatient Prescriptions  Medication Sig Dispense Refill  . acetaminophen (TYLENOL) 500 MG tablet Take 500 mg by mouth every 6 (six) hours as needed.      . clobetasol cream (TEMOVATE) 0.05 % Apply 1 application topically daily as needed (for itching).      Marland Kitchen dexamethasone (DECADRON) 4 MG tablet 4 mg by mouth twice a day the day before, day of and day after the chemotherapy every 3 weeks  40 tablet  1  . folic acid (FOLVITE) 800 MCG tablet Take 0.5 tablets (400 mcg total) by mouth daily.  30 tablet  3  . Polyethyl Glycol-Propyl Glycol (SYSTANE OP) Place 1 drop into both eyes daily.      . prochlorperazine (COMPAZINE) 10 MG tablet Take 1 tablet (10 mg total) by mouth every 6 (six) hours as needed for nausea or vomiting.  60 tablet  0  . simvastatin (ZOCOR) 10 MG tablet Take 1 tablet (10 mg total) by mouth at bedtime.  90 tablet  3  . sucralfate (CARAFATE) 1 G tablet Take 1 tablet (1 g total) by mouth 4 (four) times daily.  120 tablet  2   No current facility-administered medications for this encounter.     ALLERGIES: Sulfa antibiotics   LABORATORY DATA:  Lab Results  Component Value Date   WBC 11.8* 12/23/2012   HGB 13.2 12/23/2012   HCT 37.5* 12/23/2012   MCV 87.8 12/23/2012   PLT 181 12/23/2012   Lab Results  Component Value Date   NA 139 12/23/2012   K 4.1 12/23/2012   CL CANCELED 10/13/2012   CO2 22 12/23/2012   Lab Results  Component Value Date   ALT 18 12/23/2012   AST 21 12/23/2012   ALKPHOS 129 12/23/2012   BILITOT 0.65 12/23/2012     NARRATIVE: [name] was seen today for weekly treatment management. The chart was checked and the patient's films were reviewed. The patient states that he is doing well. No pain or  nausea. He is eating well with no significant complaints of esophagitis today. He is somewhat tired with chemotherapy having begun.  PHYSICAL EXAMINATION: weight is 165 lb 14.4 oz (75.252 kg). His oral temperature is 97.5 F (36.4 C). His blood pressure is 133/78 and his pulse is 68. His respiration is 20 and oxygen saturation is 97%.        ASSESSMENT: The patient is doing satisfactorily with treatment.  PLAN: We will continue with the patient's radiation treatment as planned.

## 2012-12-26 NOTE — Progress Notes (Signed)
Weekly rad txs, 11/15 rt chest, no pain or nausea, eating well, no difficulty swallowing food or fluids, no coughing,  Just c/o fatigue, had chemotherapy infusion this past Tuesday, food  Taste starting to change 11:28 AM

## 2012-12-26 NOTE — Progress Notes (Signed)
  Radiation Oncology         [phone] ________________________________  Name: [name] MRN: [id number]  Date: 12/03/2012  DOB: 1933/11/09  SIMULATION AND TREATMENT PLANNING NOTE  DIAGNOSIS:  Metastatic lung cancer  NARRATIVE:  The patient was brought to the CT Simulation planning suite.  Identity was confirmed.  All relevant records and images related to the planned course of therapy were reviewed.   Written consent to proceed with treatment was confirmed which was freely given after reviewing the details related to the planned course of therapy had been reviewed with the patient.  Then, the patient was set-up in a stable reproducible  supine position for radiation therapy.  CT images were obtained.  Surface markings were placed.    The CT images were loaded into the planning software.  Then the target and avoidance structures were contoured.  Treatment planning then occurred.  The radiation prescription was entered and confirmed.  A total of 3 complex treatment devices were fabricated which relate to the designed radiation treatment fields. Each of these customized fields/ complex treatment devices will be used on a daily basis during the radiation course. I have requested : Isodose Plan.   PLAN:  The patient will receive 37.5 Gy in 15 fractions.    Special treatment procedure The patient will receive chemotherapy during the course of radiation treatment. The patient may experience increased or overlapping toxicity due to this combined-modality approach and the patient will be monitored for such problems. This may include extra lab work as necessary. This therefore constitutes a special treatment procedure.   ________________________________   Radene Gunning, MD, PhD

## 2012-12-29 ENCOUNTER — Ambulatory Visit
Admission: RE | Admit: 2012-12-29 | Discharge: 2012-12-29 | Disposition: A | Discharge status: Home / Self Care | Payer: Medicare Other | Source: Ambulatory Visit | Attending: Radiation Oncology | Admitting: Radiation Oncology

## 2012-12-30 ENCOUNTER — Ambulatory Visit
Admission: RE | Admit: 2012-12-30 | Discharge: 2012-12-30 | Disposition: A | Discharge status: Home / Self Care | Payer: Medicare Other | Source: Ambulatory Visit | Attending: Radiation Oncology | Admitting: Radiation Oncology

## 2012-12-31 ENCOUNTER — Ambulatory Visit
Admission: RE | Admit: 2012-12-31 | Discharge: 2012-12-31 | Disposition: A | Discharge status: Home / Self Care | Payer: Medicare Other | Source: Ambulatory Visit | Attending: Radiation Oncology | Admitting: Radiation Oncology

## 2012-12-31 ENCOUNTER — Ambulatory Visit: Payer: Medicare Other

## 2013-01-01 ENCOUNTER — Ambulatory Visit
Admission: RE | Admit: 2013-01-01 | Discharge: 2013-01-01 | Disposition: A | Discharge status: Home / Self Care | Payer: Medicare Other | Source: Ambulatory Visit | Attending: Radiation Oncology | Admitting: Radiation Oncology

## 2013-01-01 ENCOUNTER — Encounter: Payer: Self-pay | Admitting: *Deleted

## 2013-01-01 ENCOUNTER — Encounter: Payer: Self-pay | Admitting: Radiation Oncology

## 2013-01-01 ENCOUNTER — Telehealth: Payer: Self-pay | Admitting: Internal Medicine

## 2013-01-01 ENCOUNTER — Encounter: Payer: Self-pay | Admitting: Physician Assistant

## 2013-01-01 ENCOUNTER — Ambulatory Visit (HOSPITAL_BASED_OUTPATIENT_CLINIC_OR_DEPARTMENT_OTHER): Payer: Medicare Other | Admitting: Physician Assistant

## 2013-01-01 ENCOUNTER — Telehealth: Payer: Self-pay | Admitting: *Deleted

## 2013-01-01 ENCOUNTER — Other Ambulatory Visit (HOSPITAL_BASED_OUTPATIENT_CLINIC_OR_DEPARTMENT_OTHER): Payer: Medicare Other

## 2013-01-01 ENCOUNTER — Ambulatory Visit: Payer: Medicare Other

## 2013-01-01 DIAGNOSIS — C7931 Secondary malignant neoplasm of brain: Secondary | ICD-10-CM

## 2013-01-01 DIAGNOSIS — C3490 Malignant neoplasm of unspecified part of unspecified bronchus or lung: Secondary | ICD-10-CM

## 2013-01-01 DIAGNOSIS — C3430 Malignant neoplasm of lower lobe, unspecified bronchus or lung: Secondary | ICD-10-CM

## 2013-01-01 LAB — CBC WITH DIFFERENTIAL/PLATELET
Basophils Absolute: 0 10*3/uL (ref 0.0–0.1)
Eosinophils Absolute: 0.1 10*3/uL (ref 0.0–0.5)
HGB: 12.8 g/dL — ABNORMAL LOW (ref 13.0–17.1)
LYMPH%: 18.5 % (ref 14.0–49.0)
MCH: 31.6 pg (ref 27.2–33.4)
MCV: 90.5 fL (ref 79.3–98.0)
MONO%: 12.1 % (ref 0.0–14.0)
NEUT#: 1.9 10*3/uL (ref 1.5–6.5)
Platelets: 100 10*3/uL — ABNORMAL LOW (ref 140–400)

## 2013-01-01 LAB — COMPREHENSIVE METABOLIC PANEL (CC13)
Alkaline Phosphatase: 219 U/L — ABNORMAL HIGH (ref 40–150)
BUN: 16.8 mg/dL (ref 7.0–26.0)
Chloride: 104 mEq/L (ref 98–109)
Creatinine: 0.8 mg/dL (ref 0.7–1.3)
Glucose: 95 mg/dl (ref 70–140)
Total Bilirubin: 0.91 mg/dL (ref 0.20–1.20)

## 2013-01-01 NOTE — Telephone Encounter (Signed)
Per staff message and POF I have scheduled appts.  JMW  

## 2013-01-01 NOTE — Telephone Encounter (Signed)
appts made per 12/18 POF Ref made to Dr Mitzi Hansen AVS and CAL given shh

## 2013-01-01 NOTE — Progress Notes (Signed)
   Department of Radiation Oncology  Phone:  [phone] Fax:        [phone]  Weekly Treatment Note    Name: [name] Date: 01/01/2013 MRN: [id number] DOB: September 20, 1933   Current dose: 37.5 Gy  Current fraction: 15   MEDICATIONS: Current Outpatient Prescriptions  Medication Sig Dispense Refill  . acetaminophen (TYLENOL) 500 MG tablet Take 500 mg by mouth every 6 (six) hours as needed.      . clobetasol cream (TEMOVATE) 0.05 % Apply 1 application topically daily as needed (for itching).      Marland Kitchen dexamethasone (DECADRON) 4 MG tablet 4 mg by mouth twice a day the day before, day of and day after the chemotherapy every 3 weeks  40 tablet  1  . folic acid (FOLVITE) 800 MCG tablet Take 0.5 tablets (400 mcg total) by mouth daily.  30 tablet  3  . Polyethyl Glycol-Propyl Glycol (SYSTANE OP) Place 1 drop into both eyes daily.      . prochlorperazine (COMPAZINE) 10 MG tablet Take 1 tablet (10 mg total) by mouth every 6 (six) hours as needed for nausea or vomiting.  60 tablet  0  . simvastatin (ZOCOR) 10 MG tablet Take 1 tablet (10 mg total) by mouth at bedtime.  90 tablet  3  . sucralfate (CARAFATE) 1 G tablet Take 1 tablet (1 g total) by mouth 4 (four) times daily.  120 tablet  2   No current facility-administered medications for this encounter.     ALLERGIES: Sulfa antibiotics   LABORATORY DATA:  Lab Results  Component Value Date   WBC 2.9* 01/01/2013   HGB 12.8* 01/01/2013   HCT 36.6* 01/01/2013   MCV 90.5 01/01/2013   PLT 100* 01/01/2013   Lab Results  Component Value Date   NA 139 01/01/2013   K 4.1 01/01/2013   CL CANCELED 10/13/2012   CO2 27 01/01/2013   Lab Results  Component Value Date   ALT 75* 01/01/2013   AST 51* 01/01/2013   ALKPHOS 219* 01/01/2013   BILITOT 0.91 01/01/2013     NARRATIVE: [name] was seen today for weekly treatment management. The chart was checked and the patient's films were reviewed. The patient finished his final  treatment today. He states that he has done well. No worsening esophagitis which has been manageable. Energy level has been fairly good.  PHYSICAL EXAMINATION: weight is 164 lb (74.39 kg). His oral temperature is 97.4 F (36.3 C). His blood pressure is 116/78 and his pulse is 80. His respiration is 16 and oxygen saturation is 99%.        ASSESSMENT: The patient did satisfactorily with treatment.  PLAN: Followup in one month.

## 2013-01-01 NOTE — Progress Notes (Signed)
Weekly rad txs rt chest, 15/15 eot today  No coughing,nause, or difficuoty swallowing, appetite good, energy level better than a few days ago stated,  \12:04 PM

## 2013-01-01 NOTE — Progress Notes (Addendum)
[facility] Telephone:([phone]   [phone]  SHARED VISIT PROGRESS NOTE  Rudi Heap, MD [address] [place] [address]  DIAGNOSIS: Stage IV ( T2b, N2, M1b) non-small cell lung cancer, adenocarcinoma with areas of squamous differentiation presented with left lower lobe lung mass in addition to mediastinal lymphadenopathy and brain metastases diagnosed in October of 2014 [facility] molecular studies was positive for MET amplification. He is negative for ALK, BRAF, KRAS, EGFR, ERBB2 and RET  PRIOR THERAPY:Stereotactic radiotherapy to 3 brain lesions under the care of Dr. Mitzi Hansen completed 12/08/2012.  CURRENT THERAPY: 1) palliative radiotherapy to the left lower lobe lung mass under the care of Dr. Mitzi Hansen expected to be completed 01/01/2013. 2) systemic chemotherapy with carboplatin for AUC of 5 and Alimta 500 mg/M2 every 3 weeks. First dose given on 12/23/2012.   CHEMOTHERAPY INTENT:Palliative  CURRENT # OF CHEMOTHERAPY CYCLES: 1  CURRENT ANTIEMETICS:Zofran, dexamethasone and Compazine  CURRENT SMOKING STATUS: former smoker  ORAL CHEMOTHERAPY AND CONSENT: None  CURRENT BISPHOSPHONATES USE: None  PAIN MANAGEMENT: 0/10  NARCOTICS INDUCED CONSTIPATION: None  LIVING WILL AND CODE STATUS: ?  INTERVAL HISTORY: ALEXSANDER CAVINS 79 y.o. male returns to the clinic today for follow up visit accompanied by his wife. The patient is feeling fine today with no specific complaints. He tolerated the stereotactic radiotherapy to the brain lesions fairly well. The patient denied having any nausea or vomiting. He denied having any fever or chills. He has no chest pain, shortness breath, cough or hemoptysis. He has no weight loss or night sweats. He is undergoing palliative radiotherapy to the left lower lobe lung mass and expected to complete this course on 01/01/2013. He tolerated his first cycle of chemotherapy relatively well the exception of some decreased  appetite and mild nausea. He also some generalized malaise all of this occurring a few days after chemotherapy.  MEDICAL HISTORY: Past Medical History  Diagnosis Date  . COPD (chronic obstructive pulmonary disease)   . Allergy     allergic rhinitis  . Cancer     Patient has a lung mass which is being evaluated    ALLERGIES:  is allergic to sulfa antibiotics.  MEDICATIONS:  Current Outpatient Prescriptions  Medication Sig Dispense Refill  . acetaminophen (TYLENOL) 500 MG tablet Take 500 mg by mouth every 6 (six) hours as needed.      . clobetasol cream (TEMOVATE) 0.05 % Apply 1 application topically daily as needed (for itching).      Marland Kitchen dexamethasone (DECADRON) 4 MG tablet 4 mg by mouth twice a day the day before, day of and day after the chemotherapy every 3 weeks  40 tablet  1  . folic acid (FOLVITE) 800 MCG tablet Take 0.5 tablets (400 mcg total) by mouth daily.  30 tablet  3  . Polyethyl Glycol-Propyl Glycol (SYSTANE OP) Place 1 drop into both eyes daily.      . simvastatin (ZOCOR) 10 MG tablet Take 1 tablet (10 mg total) by mouth at bedtime.  90 tablet  3  . prochlorperazine (COMPAZINE) 10 MG tablet Take 1 tablet (10 mg total) by mouth every 6 (six) hours as needed for nausea or vomiting.  60 tablet  0  . sucralfate (CARAFATE) 1 G tablet Take 1 tablet (1 g total) by mouth 4 (four) times daily.  120 tablet  2   No current facility-administered medications for this visit.    SURGICAL HISTORY:  Past Surgical History  Procedure Laterality Date  . Cholecystectomy      REVIEW OF SYSTEMS:  Constitutional: positive for malaise Eyes: negative Ears, nose, mouth, throat, and face: negative Respiratory: negative Cardiovascular: negative Gastrointestinal: positive for nausea Genitourinary:negative Integument/breast: negative Hematologic/lymphatic: negative Musculoskeletal:negative Neurological: negative Behavioral/Psych: negative Endocrine: negative Allergic/Immunologic:  negative   PHYSICAL EXAMINATION: General appearance: alert, cooperative and no distress Head: Normocephalic, without obvious abnormality, atraumatic Neck: no adenopathy, no JVD, supple, symmetrical, trachea midline and thyroid not enlarged, symmetric, no tenderness/mass/nodules Lymph nodes: Cervical, supraclavicular, and axillary nodes normal. Resp: clear to auscultation bilaterally Back: symmetric, no curvature. ROM normal. No CVA tenderness. Cardio: regular rate and rhythm, S1, S2 normal, no murmur, click, rub or gallop GI: soft, non-tender; bowel sounds normal; no masses,  no organomegaly Extremities: extremities normal, atraumatic, no cyanosis or edema Neurologic: Alert and oriented X 3, normal strength and tone. Normal symmetric reflexes. Normal coordination and gait  ECOG PERFORMANCE STATUS: 1 - Symptomatic but completely ambulatory  Blood pressure 130/73, pulse 86, temperature 97.7 F (36.5 C), temperature source Oral, resp. rate 18, height 5\' 9"  (1.753 m), weight 164 lb (74.39 kg).  LABORATORY DATA: Lab Results  Component Value Date   WBC 2.9* 01/01/2013   HGB 12.8* 01/01/2013   HCT 36.6* 01/01/2013   MCV 90.5 01/01/2013   PLT 100* 01/01/2013      Chemistry      Component Value Date/Time   NA 139 01/01/2013 0956   NA CANCELED 10/13/2012 1149   NA 138 06/13/2007 0420   K 4.1 01/01/2013 0956   K CANCELED 10/13/2012 1149   CL CANCELED 10/13/2012 1149   CO2 27 01/01/2013 0956   CO2 CANCELED 10/13/2012 1149   BUN 16.8 01/01/2013 0956   BUN CANCELED 10/13/2012 1149   BUN 10 06/13/2007 0420   CREATININE 0.8 01/01/2013 0956   CREATININE 1.00 11/10/2012 1431      Component Value Date/Time   CALCIUM 9.1 01/01/2013 0956   CALCIUM CANCELED 10/13/2012 1149   ALKPHOS 219* 01/01/2013 0956   ALKPHOS CANCELED 10/13/2012 1149   AST 51* 01/01/2013 0956   AST CANCELED 10/13/2012 1149   ALT 75* 01/01/2013 0956   ALT CANCELED 10/13/2012 1149   BILITOT 0.91 01/01/2013 0956   BILITOT  CANCELED 10/13/2012 1149       RADIOGRAPHIC STUDIES: Mr Laqueta Jean Wo Contrast  12/20/12   CLINICAL DATA:  Metastatic lung cancer.  EXAM: MRI HEAD WITHOUT AND WITH CONTRAST  TECHNIQUE: Multiplanar, multiecho pulse sequences of the brain and surrounding structures were obtained without and with intravenous contrast. SRS protocol.  CONTRAST:  15 cc MultiHance  COMPARISON:  11/10/2012 brain MRI  FINDINGS: There is mild age-related cerebral volume loss. Again seen are scattered foci of T2 hyperintensity within the subcortical and deep cerebral white matter bilaterally, compatible with moderate chronic small vessel ischemic disease. Ring-enhancing metastasis in the medial right cerebellum measures 13 x 11 mm (series 10, image 45). Ring-enhancing lesion in the right post central gyrus and measures 8 x 7 mm (series 10, image 114). Ring-enhancing lesion in the medial left occipital lobe measures 7 x 5 mm (series 10, image 73). Allowing for differences in technique, these lesions do not appear significantly changed in size compared to recent routine brain MRI. There is very mild vasogenic edema associated with these lesions without mass effect. No new lesions are identified. There is no evidence of intracranial hemorrhage, midline shift, or extra-axial fluid collection. Major intracranial flow voids are unremarkable. Orbits and paranasal sinuses are unremarkable.  IMPRESSION: Unchanged appearance of 3 ring enhancing brain lesions, consistent with metastases. No new lesions identified.   Electronically Signed   By: Sebastian Ache   On: 12/02/2012 15:44   Ct Biopsy  11/24/2012   CLINICAL DATA:  Hypermetabolic cavitary right lower lobe lung lesion.  EXAM: CT GUIDED CORE BIOPSY OF RIGHT LOWER LOBE LUNG LESION  ANESTHESIA/SEDATION: Intravenous Fentanyl and Versed were administered as conscious sedation during continuous cardiorespiratory monitoring by the radiology RN, with a total moderate sedation time of 16 minutes.   PROCEDURE: The procedure risks, benefits, and alternatives were explained to the patient. Questions regarding the procedure were encouraged and answered. The patient understands and consents to the procedure. Patient placed in right lateral decubitus position. Limited axial scans obtained through the thorax. An appropriate skin entry site was determined.  The posterior thorax was prepped with Betadinein a sterile fashion, and a sterile drape was applied covering the operative field. A sterile gown and sterile gloves were used for the procedure. Local anesthesia was provided with 1% Lidocaine. Under CT fluoroscopic guidance, a 17 gauge trocar needle was advanced to the margin of the lesion. Once needle tip position was confirmed, coaxial 18-gauge core biopsy samples were obtained, submitted in formalin to surgical pathology. The guide needle was removed. Postprocedure scans show no pneumothorax or other apparent complication. The patient tolerated the procedure well.  Complications: None  : IMPRESSION:  Technically successful CT-guided core biopsy of right lower lobe lung lesion. Surveillance and post procedure radiography scheduled.   Electronically Signed   By: Oley Balm M.D.   On: 11/24/2012 10:46   Dg Chest Port 1 View  11/24/2012   CLINICAL DATA:  Right lung biopsy.  EXAM: PORTABLE CHEST - 1 VIEW  COMPARISON:  CT biopsy images 11 10/2012.  Chest x-ray 10/13/2012.  FINDINGS: Mediastinum is normal. Heart size normal. No pneumothorax. Changes of COPD. Infiltrative changes of the right lower lobe. Hemidiaphragms are not completely imaged. Next feel  IMPRESSION: No evidence of pneumothorax post biopsy.   Electronically Signed   By: Maisie Fus  Register   On: 11/24/2012 11:17    ASSESSMENT AND PLAN: this is a very pleasant 79 years old white male with metastatic non-small cell lung cancer, adenocarcinoma with brain metastasis status post stereotactic radiotherapy to the brain lesions and currently undergoing  palliative radiotherapy to the left lower lobe lung mass. His molecular biomarkers revealed that he was MET amplification. He is negative for ALK, BRAF, KRAS, EGFR, ERBB2 and RET. He is being treated with palliative systemic chemotherapy with carboplatin for AUC of 5 and Alimta 500 mg/M2 every 3 weeks. Status post 1 cycle. Patient was discussed with also seen by Dr. Arbutus Ped. He will continue with weekly labs as scheduled and followup in 2 weeks prior to the start of cycle #2.   The patient voices understanding of current disease status and treatment options and is in agreement with the current care plan.  All questions were answered. The patient knows to call the clinic with any problems, questions or concerns. We can certainly see the patient much sooner if necessary.  Conni Slipper PA-C  ADDENDUM: Hematology/Oncology Attending: I had the face to face encounter with the patient. I recommended for her care plan. This is a very pleasant 79 years old white male recently diagnosed with stage IV non-small cell lung cancer currently undergoing systemic chemotherapy with carboplatin and Alimta status post 1 cycle. The patient tolerated the first cycle of his treatment fairly well with  no significant adverse effects. His molecular biomarkers showed positive MET amplification but the patient has negative EGFR mutation and negative ALK gene translocation. I recommended for him to continue his current treatment was carboplatin and Alimta as scheduled. He would come back for follow up visit in 2 weeks for the start of cycle #2. The patient was advised to call immediately if he has any concerning symptoms in the interval. Lajuana Matte., MD 01/05/2013

## 2013-01-01 NOTE — Progress Notes (Signed)
Per Dr. Asa Lente request went on [facility] secure website and obtained results.  Printed and given to Dr. Arbutus Ped.

## 2013-01-04 NOTE — Patient Instructions (Signed)
Continue with weekly labs as scheduled Follow-up in 2 weeks prior to starting next scheduled cycle of chemotherapy 

## 2013-01-05 NOTE — Progress Notes (Signed)
  Radiation Oncology         [phone] ________________________________  Name: [name] MRN: [id number]  Date: 01/01/2013  DOB: 03/29/1933  End of Treatment Note  Diagnosis:   metastatic lung cancer     Indication for treatment:  Palliative       Radiation treatment dates:   12/09/2012 through 01/01/2013  Site/dose:   The patient was treated with a course of palliative thoracic radiotherapy to 37.5 gray. This consisted of a 3 field technique. The patient received daily image guidance during this treatment. The dominant gross disease within the chest was targeted for this course of treatment.  Narrative: The patient tolerated radiation treatment relatively well.   The patient did quite well during his course of radiation treatment. Some moderate esophagitis present.  Plan: The patient has completed radiation treatment. The patient will return to radiation oncology clinic for routine followup in one month. I advised the patient to call or return sooner if they have any questions or concerns related to their recovery or treatment. ________________________________  Radene Gunning, M.D., Ph.D.

## 2013-01-06 ENCOUNTER — Other Ambulatory Visit (HOSPITAL_BASED_OUTPATIENT_CLINIC_OR_DEPARTMENT_OTHER): Payer: Medicare Other

## 2013-01-06 DIAGNOSIS — C3430 Malignant neoplasm of lower lobe, unspecified bronchus or lung: Secondary | ICD-10-CM

## 2013-01-06 DIAGNOSIS — C7931 Secondary malignant neoplasm of brain: Secondary | ICD-10-CM

## 2013-01-06 LAB — COMPREHENSIVE METABOLIC PANEL (CC13)
ALT: 32 U/L (ref 0–55)
AST: 22 U/L (ref 5–34)
Albumin: 3.3 g/dL — ABNORMAL LOW (ref 3.5–5.0)
Anion Gap: 8 mEq/L (ref 3–11)
BUN: 16 mg/dL (ref 7.0–26.0)
CO2: 28 mEq/L (ref 22–29)
Calcium: 9.1 mg/dL (ref 8.4–10.4)
Chloride: 103 mEq/L (ref 98–109)
Creatinine: 0.8 mg/dL (ref 0.7–1.3)
Sodium: 140 mEq/L (ref 136–145)
Total Protein: 6.9 g/dL (ref 6.4–8.3)

## 2013-01-06 LAB — CBC WITH DIFFERENTIAL/PLATELET
Basophils Absolute: 0 10*3/uL (ref 0.0–0.1)
HCT: 35.6 % — ABNORMAL LOW (ref 38.4–49.9)
HGB: 12.3 g/dL — ABNORMAL LOW (ref 13.0–17.1)
MCHC: 34.5 g/dL (ref 32.0–36.0)
MCV: 91.4 fL (ref 79.3–98.0)
MONO#: 0.5 10*3/uL (ref 0.1–0.9)
NEUT%: 67.5 % (ref 39.0–75.0)
WBC: 3.5 10*3/uL — ABNORMAL LOW (ref 4.0–10.3)
lymph#: 0.5 10*3/uL — ABNORMAL LOW (ref 0.9–3.3)

## 2013-01-07 ENCOUNTER — Encounter (HOSPITAL_COMMUNITY): Payer: Self-pay

## 2013-01-13 ENCOUNTER — Encounter: Payer: Self-pay | Admitting: Physician Assistant

## 2013-01-13 ENCOUNTER — Other Ambulatory Visit (HOSPITAL_BASED_OUTPATIENT_CLINIC_OR_DEPARTMENT_OTHER): Payer: Medicare Other

## 2013-01-13 ENCOUNTER — Ambulatory Visit (HOSPITAL_BASED_OUTPATIENT_CLINIC_OR_DEPARTMENT_OTHER): Payer: Medicare Other | Admitting: Physician Assistant

## 2013-01-13 ENCOUNTER — Other Ambulatory Visit: Payer: Medicare Other

## 2013-01-13 ENCOUNTER — Ambulatory Visit (HOSPITAL_BASED_OUTPATIENT_CLINIC_OR_DEPARTMENT_OTHER): Payer: Medicare Other

## 2013-01-13 DIAGNOSIS — C3430 Malignant neoplasm of lower lobe, unspecified bronchus or lung: Secondary | ICD-10-CM

## 2013-01-13 DIAGNOSIS — C7931 Secondary malignant neoplasm of brain: Secondary | ICD-10-CM

## 2013-01-13 DIAGNOSIS — Z5111 Encounter for antineoplastic chemotherapy: Secondary | ICD-10-CM

## 2013-01-13 LAB — COMPREHENSIVE METABOLIC PANEL (CC13)
ALT: 27 U/L (ref 0–55)
AST: 21 U/L (ref 5–34)
Albumin: 3.6 g/dL (ref 3.5–5.0)
BUN: 15.2 mg/dL (ref 7.0–26.0)
CO2: 23 mEq/L (ref 22–29)
Calcium: 9.4 mg/dL (ref 8.4–10.4)
Chloride: 105 mEq/L (ref 98–109)
Potassium: 3.8 mEq/L (ref 3.5–5.1)
Sodium: 140 mEq/L (ref 136–145)
Total Protein: 7.1 g/dL (ref 6.4–8.3)

## 2013-01-13 LAB — CBC WITH DIFFERENTIAL/PLATELET
BASO%: 0.2 % (ref 0.0–2.0)
Basophils Absolute: 0 10*3/uL (ref 0.0–0.1)
EOS%: 0 % (ref 0.0–7.0)
HGB: 11.2 g/dL — ABNORMAL LOW (ref 13.0–17.1)
LYMPH%: 7.3 % — ABNORMAL LOW (ref 14.0–49.0)
MCH: 30.9 pg (ref 27.2–33.4)
NEUT#: 5.4 10*3/uL (ref 1.5–6.5)
Platelets: 261 10*3/uL (ref 140–400)
RBC: 3.62 10*6/uL — ABNORMAL LOW (ref 4.20–5.82)
RDW: 13.2 % (ref 11.0–14.6)
lymph#: 0.5 10*3/uL — ABNORMAL LOW (ref 0.9–3.3)

## 2013-01-13 MED ORDER — SODIUM CHLORIDE 0.9 % IV SOLN
453.5000 mg | Freq: Once | INTRAVENOUS | Status: AC
  Administered 2013-01-13: 450 mg via INTRAVENOUS
  Filled 2013-01-13: qty 45

## 2013-01-13 MED ORDER — ONDANSETRON 16 MG/50ML IVPB (CHCC)
INTRAVENOUS | Status: AC
  Filled 2013-01-13: qty 16

## 2013-01-13 MED ORDER — SODIUM CHLORIDE 0.9 % IV SOLN
500.0000 mg/m2 | Freq: Once | INTRAVENOUS | Status: AC
  Administered 2013-01-13: 950 mg via INTRAVENOUS
  Filled 2013-01-13: qty 38

## 2013-01-13 MED ORDER — DEXAMETHASONE SODIUM PHOSPHATE 20 MG/5ML IJ SOLN
INTRAMUSCULAR | Status: AC
  Filled 2013-01-13: qty 5

## 2013-01-13 MED ORDER — DEXAMETHASONE SODIUM PHOSPHATE 20 MG/5ML IJ SOLN
20.0000 mg | Freq: Once | INTRAMUSCULAR | Status: AC
  Administered 2013-01-13: 20 mg via INTRAVENOUS

## 2013-01-13 MED ORDER — ONDANSETRON 16 MG/50ML IVPB (CHCC)
16.0000 mg | Freq: Once | INTRAVENOUS | Status: AC
Start: 2013-01-13 — End: 2013-01-13
  Administered 2013-01-13: 16 mg via INTRAVENOUS

## 2013-01-13 MED ORDER — SODIUM CHLORIDE 0.9 % IV SOLN
Freq: Once | INTRAVENOUS | Status: AC
  Administered 2013-01-13: 11:00:00 via INTRAVENOUS

## 2013-01-13 NOTE — Patient Instructions (Signed)
Continue with weekly labs as scheduled Followup in 3 weeks prior to the start of her next scheduled cycle of chemotherapy

## 2013-01-13 NOTE — Progress Notes (Addendum)
[facility] [phone]   [phone]  SHARED VISIT PROGRESS NOTE  Preston Heap, [name] [address] [place] [address]  DIAGNOSIS: Stage IV ( T2b, N2, M1b) non-small cell lung cancer, adenocarcinoma with areas of squamous differentiation presented with left lower lobe lung mass in addition to mediastinal lymphadenopathy and brain metastases diagnosed in October of 2014 [facility] 1 molecular studies was positive for MET amplification. He is negative for ALK, BRAF, KRAS, EGFR, ERBB2 and RET  PRIOR THERAPY: 1. Stereotactic radiotherapy to 3 brain lesions under the care of Dr. Mitzi Hansen completed 12/08/2012. 2. palliative radiotherapy to the left lower lobe lung mass under the care of Dr. Mitzi Hansen expected to be completed 01/01/2013.  CURRENT THERAPY:  systemic chemotherapy with carboplatin for AUC of 5 and Alimta 500 mg/M2 every 3 weeks. First dose given on 12/23/2012. Status post 1 cycle.   CHEMOTHERAPY INTENT:Palliative  CURRENT # OF CHEMOTHERAPY CYCLES: 2  CURRENT ANTIEMETICS:Zofran, dexamethasone and Compazine  CURRENT SMOKING STATUS: former smoker  ORAL CHEMOTHERAPY AND CONSENT: None  CURRENT BISPHOSPHONATES USE: None  PAIN MANAGEMENT: 0/10  NARCOTICS INDUCED CONSTIPATION: None  LIVING WILL AND CODE STATUS: ?  INTERVAL HISTORY: [name] 79 y.o. male returns to the clinic today for follow up visit accompanied by his wife. The patient is feeling fine today with no specific complaints except for a mild headache not associated with any blurred or double vision or any nausea or vomiting. The headache occurs frequently but not daily. It is relieved with either Tylenol or it spontaneously resolves. He tolerated the stereotactic radiotherapy to the brain lesions fairly well. The patient denied having any nausea or vomiting. He denied having any fever or chills. He has no chest pain, shortness breath, cough or hemoptysis. He has no weight loss  or night sweats. He completed a course of palliative radiotherapy to the left lower lobe lung mass on 01/01/2013. He tolerated his first cycle of chemotherapy relatively well the exception of some decreased appetite and mild nausea. He also some generalized malaise all of this occurring a few days after chemotherapy. He presents to proceed with cycle #2.  MEDICAL HISTORY: Past Medical History  Diagnosis Date  . COPD (chronic obstructive pulmonary disease)   . Allergy     allergic rhinitis  . Cancer     Patient has a lung mass which is being evaluated    ALLERGIES:  is allergic to sulfa antibiotics.  MEDICATIONS:  Current Outpatient Prescriptions  Medication Sig Dispense Refill  . acetaminophen (TYLENOL) 500 MG tablet Take 500 mg by mouth every 6 (six) hours as needed.      . clobetasol cream (TEMOVATE) 0.05 % Apply 1 application topically daily as needed (for itching).      Marland Kitchen dexamethasone (DECADRON) 4 MG tablet 4 mg by mouth twice a day the day before, day of and day after the chemotherapy every 3 weeks  40 tablet  1  . folic acid (FOLVITE) 800 MCG tablet Take 0.5 tablets (400 mcg total) by mouth daily.  30 tablet  3  . Polyethyl Glycol-Propyl Glycol (SYSTANE OP) Place 1 drop into both eyes daily.      . simvastatin (ZOCOR) 10 MG tablet Take 1 tablet (10 mg total) by mouth at bedtime.  90 tablet  3  . sucralfate (CARAFATE) 1 G tablet Take 1 tablet (1 g total) by mouth 4 (four) times daily.  120 tablet  2  . prochlorperazine (COMPAZINE) 10  MG tablet Take 1 tablet (10 mg total) by mouth every 6 (six) hours as needed for nausea or vomiting.  60 tablet  0   No current facility-administered medications for this visit.    SURGICAL HISTORY:  Past Surgical History  Procedure Laterality Date  . Cholecystectomy      REVIEW OF SYSTEMS:  Constitutional: positive for malaise Eyes: negative Ears, nose, mouth, throat, and face: negative Respiratory: negative Cardiovascular:  negative Gastrointestinal: positive for nausea Genitourinary:negative Integument/breast: negative Hematologic/lymphatic: negative Musculoskeletal:negative Neurological: positive for headaches Behavioral/Psych: negative Endocrine: negative Allergic/Immunologic: negative   PHYSICAL EXAMINATION: General appearance: alert, cooperative and no distress Head: Normocephalic, without obvious abnormality, atraumatic Neck: no adenopathy, no JVD, supple, symmetrical, trachea midline and thyroid not enlarged, symmetric, no tenderness/mass/nodules Lymph nodes: Cervical, supraclavicular, and axillary nodes normal. Resp: clear to auscultation bilaterally Back: symmetric, no curvature. ROM normal. No CVA tenderness. Cardio: regular rate and rhythm, S1, S2 normal, no murmur, click, rub or gallop GI: soft, non-tender; bowel sounds normal; no masses,  no organomegaly Extremities: extremities normal, atraumatic, no cyanosis or edema Neurologic: Alert and oriented X 3, normal strength and tone. Normal symmetric reflexes. Normal coordination and gait  ECOG PERFORMANCE STATUS: 1 - Symptomatic but completely ambulatory  Blood pressure 145/83, pulse 70, temperature 96.8 F (36 C), temperature source Oral, resp. rate 18, height 5\' 9"  (1.753 m), weight 170 lb 4.8 oz (77.248 kg).  LABORATORY DATA: Lab Results  Component Value Date   WBC 6.6 01/13/2013   HGB 11.2* 01/13/2013   HCT 31.6* 01/13/2013   MCV 87.3 01/13/2013   PLT 261 01/13/2013      Chemistry      Component Value Date/Time   NA 140 01/06/2013 1038   NA CANCELED 10/13/2012 1149   NA 138 06/13/2007 0420   K 3.8 01/06/2013 1038   K CANCELED 10/13/2012 1149   CL CANCELED 10/13/2012 1149   CO2 28 01/06/2013 1038   CO2 CANCELED 10/13/2012 1149   BUN 16.0 01/06/2013 1038   BUN CANCELED 10/13/2012 1149   BUN 10 06/13/2007 0420   CREATININE 0.8 01/06/2013 1038   CREATININE 1.00 11/10/2012 1431      Component Value Date/Time   CALCIUM 9.1  01/06/2013 1038   CALCIUM CANCELED 10/13/2012 1149   ALKPHOS 172* 01/06/2013 1038   ALKPHOS CANCELED 10/13/2012 1149   AST 22 01/06/2013 1038   AST CANCELED 10/13/2012 1149   ALT 32 01/06/2013 1038   ALT CANCELED 10/13/2012 1149   BILITOT 0.59 01/06/2013 1038   BILITOT CANCELED 10/13/2012 1149       RADIOGRAPHIC STUDIES: Mr Laqueta Jean Wo Contrast  12-18-12   CLINICAL DATA:  Metastatic lung cancer.  EXAM: MRI HEAD WITHOUT AND WITH CONTRAST  TECHNIQUE: Multiplanar, multiecho pulse sequences of the brain and surrounding structures were obtained without and with intravenous contrast. SRS protocol.  CONTRAST:  15 cc MultiHance  COMPARISON:  11/10/2012 brain MRI  FINDINGS: There is mild age-related cerebral volume loss. Again seen are scattered foci of T2 hyperintensity within the subcortical and deep cerebral white matter bilaterally, compatible with moderate chronic small vessel ischemic disease. Ring-enhancing metastasis in the medial right cerebellum measures 13 x 11 mm (series 10, image 45). Ring-enhancing lesion in the right post central gyrus and measures 8 x 7 mm (series 10, image 114). Ring-enhancing lesion in the medial left occipital lobe measures 7 x 5 mm (series 10, image 73). Allowing for differences in technique, these lesions do not appear significantly changed in size compared  to recent routine brain MRI. There is very mild vasogenic edema associated with these lesions without mass effect. No new lesions are identified. There is no evidence of intracranial hemorrhage, midline shift, or extra-axial fluid collection. Major intracranial flow voids are unremarkable. Orbits and paranasal sinuses are unremarkable.  IMPRESSION: Unchanged appearance of 3 ring enhancing brain lesions, consistent with metastases. No new lesions identified.   Electronically Signed   By: Sebastian Ache   On: 12/02/2012 15:44   Ct Biopsy  11/24/2012   CLINICAL DATA:  Hypermetabolic cavitary right lower lobe lung lesion.   EXAM: CT GUIDED CORE BIOPSY OF RIGHT LOWER LOBE LUNG LESION  ANESTHESIA/SEDATION: Intravenous Fentanyl and Versed were administered as conscious sedation during continuous cardiorespiratory monitoring by the radiology RN, with a total moderate sedation time of 16 minutes.  PROCEDURE: The procedure risks, benefits, and alternatives were explained to the patient. Questions regarding the procedure were encouraged and answered. The patient understands and consents to the procedure. Patient placed in right lateral decubitus position. Limited axial scans obtained through the thorax. An appropriate skin entry site was determined.  The posterior thorax was prepped with Betadinein a sterile fashion, and a sterile drape was applied covering the operative field. A sterile gown and sterile gloves were used for the procedure. Local anesthesia was provided with 1% Lidocaine. Under CT fluoroscopic guidance, a 17 gauge trocar needle was advanced to the margin of the lesion. Once needle tip position was confirmed, coaxial 18-gauge core biopsy samples were obtained, submitted in formalin to surgical pathology. The guide needle was removed. Postprocedure scans show no pneumothorax or other apparent complication. The patient tolerated the procedure well.  Complications: None  : IMPRESSION:  Technically successful CT-guided core biopsy of right lower lobe lung lesion. Surveillance and post procedure radiography scheduled.   Electronically Signed   By: Oley Balm M.D.   On: 11/24/2012 10:46   Dg Chest Port 1 View  11/24/2012   CLINICAL DATA:  Right lung biopsy.  EXAM: PORTABLE CHEST - 1 VIEW  COMPARISON:  CT biopsy images 11 10/2012.  Chest x-ray 10/13/2012.  FINDINGS: Mediastinum is normal. Heart size normal. No pneumothorax. Changes of COPD. Infiltrative changes of the right lower lobe. Hemidiaphragms are not completely imaged. Next feel  IMPRESSION: No evidence of pneumothorax post biopsy.   Electronically Signed   By: Maisie Fus   Register   On: 11/24/2012 11:17    ASSESSMENT AND PLAN: this is a very pleasant 79 years old white male with metastatic non-small cell lung cancer, adenocarcinoma with brain metastasis status post stereotactic radiotherapy to the brain lesions and currently undergoing palliative radiotherapy to the left lower lobe lung mass. His molecular biomarkers revealed that he was positive for MET amplification. He is negative for ALK, BRAF, KRAS, EGFR, ERBB2 and RET. He is being treated with palliative systemic chemotherapy with carboplatin for AUC of 5 and Alimta 500 mg/M2 every 3 weeks. Status post 1 cycle. Patient was discussed with also seen by Dr. Arbutus Ped. He will continue with weekly labs as scheduled and followup in 3 weeks prior to the start of cycle #3. Patient was advised to monitor his headache symptoms. Also advised to increase his fluid intake.   The patient voices understanding of current disease status and treatment options and is in agreement with the current care plan.  All questions were answered. The patient knows to call the clinic with any problems, questions or concerns. We can certainly see the patient much sooner if necessary.  Laural Benes,  Maia Petties, PA-C  ADDENDUM:  Hematology/Oncology Attending:  I had the face-to-face encounter with the patient. I recommended his care plan. This is a very pleasant 79 years old white male with metastatic non-small cell lung cancer, adenocarcinoma with negative EGFR mutation and negative ALK gene translocation but positive MET amplification. The patient is currently undergoing systemic chemotherapy with carboplatin and Alimta status post 1 cycle. He tolerated the first cycle of his treatment fairly well with no significant adverse effects. I recommended for the patient to proceed with cycle #2 today as scheduled. He would come back for follow up visit in 3 weeks with the start of cycle #3. He was advised to call immediately if he has any concerning  symptoms in the interval. Lajuana Matte., [name] 01/15/2013

## 2013-01-13 NOTE — Patient Instructions (Signed)
Pemetrexed injection  What is this medicine?  PEMETREXED (PEM e TREX ed) is a chemotherapy drug. This medicine affects cells that are rapidly growing, such as cancer cells and cells in your mouth and stomach. It is usually used to treat lung cancers like non-small cell lung cancer and mesothelioma. It may also be used to treat other cancers.  This medicine may be used for other purposes; ask your health care provider or pharmacist if you have questions.  COMMON BRAND NAME(S): Alimta  What should I tell my health care provider before I take this medicine?  They need to know if you have any of these conditions:  -if you frequently drink alcohol containing beverages  -infection (especially a virus infection such as chickenpox, cold sores, or herpes)  -kidney disease  -liver disease  -low blood counts, like low platelets, red bloods, or white blood cells  -an unusual or allergic reaction to pemetrexed, mannitol, other medicines, foods, dyes, or preservatives  -pregnant or trying to get pregnant  -breast-feeding  How should I use this medicine?  This drug is given as an infusion into a vein. It is administered in a hospital or clinic by a specially trained health care professional.  Talk to your pediatrician regarding the use of this medicine in children. Special care may be needed.  Overdosage: If you think you have taken too much of this medicine contact a poison control center or emergency room at once.  NOTE: This medicine is only for you. Do not share this medicine with others.  What if I miss a dose?  It is important not to miss your dose. Call your doctor or health care professional if you are unable to keep an appointment.  What may interact with this medicine?  -aspirin and aspirin-like medicines  -medicines to increase blood counts like filgrastim, pegfilgrastim, sargramostim  -methotrexate  -NSAIDS, medicines for pain and inflammation, like ibuprofen or naproxen  -probenecid  -pyrimethamine  -vaccines  Talk to  your doctor or health care professional before taking any of these medicines:  -acetaminophen  -aspirin  -ibuprofen  -ketoprofen  -naproxen  This list may not describe all possible interactions. Give your health care provider a list of all the medicines, herbs, non-prescription drugs, or dietary supplements you use. Also tell them if you smoke, drink alcohol, or use illegal drugs. Some items may interact with your medicine.  What should I watch for while using this medicine?  Visit your doctor for checks on your progress. This drug may make you feel generally unwell. This is not uncommon, as chemotherapy can affect healthy cells as well as cancer cells. Report any side effects. Continue your course of treatment even though you feel ill unless your doctor tells you to stop.  In some cases, you may be given additional medicines to help with side effects. Follow all directions for their use.  Call your doctor or health care professional for advice if you get a fever, chills or sore throat, or other symptoms of a cold or flu. Do not treat yourself. This drug decreases your body's ability to fight infections. Try to avoid being around people who are sick.  This medicine may increase your risk to bruise or bleed. Call your doctor or health care professional if you notice any unusual bleeding.  Be careful brushing and flossing your teeth or using a toothpick because you may get an infection or bleed more easily. If you have any dental work done, tell your dentist you are   receiving this medicine.  Avoid taking products that contain aspirin, acetaminophen, ibuprofen, naproxen, or ketoprofen unless instructed by your doctor. These medicines may hide a fever.  Call your doctor or health care professional if you get diarrhea or mouth sores. Do not treat yourself.  To protect your kidneys, drink water or other fluids as directed while you are taking this medicine.  Men and women must use effective birth control while taking this  medicine. You may also need to continue using effective birth control for a time after stopping this medicine. Do not become pregnant while taking this medicine. Tell your doctor right away if you think that you or your partner might be pregnant. There is a potential for serious side effects to an unborn child. Talk to your health care professional or pharmacist for more information. Do not breast-feed an infant while taking this medicine. This medicine may lower sperm counts.  What side effects may I notice from receiving this medicine?  Side effects that you should report to your doctor or health care professional as soon as possible:  -allergic reactions like skin rash, itching or hives, swelling of the face, lips, or tongue  -low blood counts - this medicine may decrease the number of white blood cells, red blood cells and platelets. You may be at increased risk for infections and bleeding.  -signs of infection - fever or chills, cough, sore throat, pain or difficulty passing urine  -signs of decreased platelets or bleeding - bruising, pinpoint red spots on the skin, black, tarry stools, blood in the urine  -signs of decreased red blood cells - unusually weak or tired, fainting spells, lightheadedness  -breathing problems, like a dry cough  -changes in emotions or moods  -chest pain  -confusion  -diarrhea  -high blood pressure  -mouth or throat sores or ulcers  -pain, swelling, warmth in the leg  -pain on swallowing  -swelling of the ankles, feet, hands  -trouble passing urine or change in the amount of urine  -vomiting  -yellowing of the eyes or skin  Side effects that usually do not require medical attention (report to your doctor or health care professional if they continue or are bothersome):  -hair loss  -loss of appetite  -nausea  -stomach upset  This list may not describe all possible side effects. Call your doctor for medical advice about side effects. You may report side effects to FDA at  1-800-FDA-1088.  Where should I keep my medicine?  This drug is given in a hospital or clinic and will not be stored at home.  NOTE: This sheet is a summary. It may not cover all possible information. If you have questions about this medicine, talk to your doctor, pharmacist, or health care provider.   2014, Elsevier/Gold Standard. (2007-08-05 13:24:03)  Carboplatin injection  What is this medicine?  CARBOPLATIN (KAR boe pla tin) is a chemotherapy drug. It targets fast dividing cells, like cancer cells, and causes these cells to die. This medicine is used to treat ovarian cancer and many other cancers.  This medicine may be used for other purposes; ask your health care provider or pharmacist if you have questions.  COMMON BRAND NAME(S): Paraplatin  What should I tell my health care provider before I take this medicine?  They need to know if you have any of these conditions:  -blood disorders  -hearing problems  -kidney disease  -recent or ongoing radiation therapy  -an unusual or allergic reaction to carboplatin, cisplatin, other chemotherapy,   other medicines, foods, dyes, or preservatives  -pregnant or trying to get pregnant  -breast-feeding  How should I use this medicine?  This drug is usually given as an infusion into a vein. It is administered in a hospital or clinic by a specially trained health care professional.  Talk to your pediatrician regarding the use of this medicine in children. Special care may be needed.  Overdosage: If you think you have taken too much of this medicine contact a poison control center or emergency room at once.  NOTE: This medicine is only for you. Do not share this medicine with others.  What if I miss a dose?  It is important not to miss a dose. Call your doctor or health care professional if you are unable to keep an appointment.  What may interact with this medicine?  -medicines for seizures  -medicines to increase blood counts like filgrastim, pegfilgrastim,  sargramostim  -some antibiotics like amikacin, gentamicin, neomycin, streptomycin, tobramycin  -vaccines  Talk to your doctor or health care professional before taking any of these medicines:  -acetaminophen  -aspirin  -ibuprofen  -ketoprofen  -naproxen  This list may not describe all possible interactions. Give your health care provider a list of all the medicines, herbs, non-prescription drugs, or dietary supplements you use. Also tell them if you smoke, drink alcohol, or use illegal drugs. Some items may interact with your medicine.  What should I watch for while using this medicine?  Your condition will be monitored carefully while you are receiving this medicine. You will need important blood work done while you are taking this medicine.  This drug may make you feel generally unwell. This is not uncommon, as chemotherapy can affect healthy cells as well as cancer cells. Report any side effects. Continue your course of treatment even though you feel ill unless your doctor tells you to stop.  In some cases, you may be given additional medicines to help with side effects. Follow all directions for their use.  Call your doctor or health care professional for advice if you get a fever, chills or sore throat, or other symptoms of a cold or flu. Do not treat yourself. This drug decreases your body's ability to fight infections. Try to avoid being around people who are sick.  This medicine may increase your risk to bruise or bleed. Call your doctor or health care professional if you notice any unusual bleeding.  Be careful brushing and flossing your teeth or using a toothpick because you may get an infection or bleed more easily. If you have any dental work done, tell your dentist you are receiving this medicine.  Avoid taking products that contain aspirin, acetaminophen, ibuprofen, naproxen, or ketoprofen unless instructed by your doctor. These medicines may hide a fever.  Do not become pregnant while taking this  medicine. Women should inform their doctor if they wish to become pregnant or think they might be pregnant. There is a potential for serious side effects to an unborn child. Talk to your health care professional or pharmacist for more information. Do not breast-feed an infant while taking this medicine.  What side effects may I notice from receiving this medicine?  Side effects that you should report to your doctor or health care professional as soon as possible:  -allergic reactions like skin rash, itching or hives, swelling of the face, lips, or tongue  -signs of infection - fever or chills, cough, sore throat, pain or difficulty passing urine  -signs of decreased   platelets or bleeding - bruising, pinpoint red spots on the skin, black, tarry stools, nosebleeds  -signs of decreased red blood cells - unusually weak or tired, fainting spells, lightheadedness  -breathing problems  -changes in hearing  -changes in vision  -chest pain  -high blood pressure  -low blood counts - This drug may decrease the number of white blood cells, red blood cells and platelets. You may be at increased risk for infections and bleeding.  -nausea and vomiting  -pain, swelling, redness or irritation at the injection site  -pain, tingling, numbness in the hands or feet  -problems with balance, talking, walking  -trouble passing urine or change in the amount of urine  Side effects that usually do not require medical attention (report to your doctor or health care professional if they continue or are bothersome):  -hair loss  -loss of appetite  -metallic taste in the mouth or changes in taste  This list may not describe all possible side effects. Call your doctor for medical advice about side effects. You may report side effects to FDA at 1-800-FDA-1088.  Where should I keep my medicine?  This drug is given in a hospital or clinic and will not be stored at home.  NOTE: This sheet is a summary. It may not cover all possible information. If you  have questions about this medicine, talk to your doctor, pharmacist, or health care provider.   2014, Elsevier/Gold Standard. (2007-04-08 14:38:05)

## 2013-01-14 ENCOUNTER — Telehealth: Payer: Self-pay | Admitting: Physician Assistant

## 2013-01-14 NOTE — Telephone Encounter (Signed)
appts made per 12/30 POF Called pts wife and advised of all Jan 2015 appts to date shh

## 2013-01-20 ENCOUNTER — Other Ambulatory Visit (HOSPITAL_BASED_OUTPATIENT_CLINIC_OR_DEPARTMENT_OTHER): Payer: Medicare Other

## 2013-01-20 DIAGNOSIS — C7931 Secondary malignant neoplasm of brain: Secondary | ICD-10-CM

## 2013-01-20 DIAGNOSIS — C3430 Malignant neoplasm of lower lobe, unspecified bronchus or lung: Secondary | ICD-10-CM

## 2013-01-20 DIAGNOSIS — C7949 Secondary malignant neoplasm of other parts of nervous system: Secondary | ICD-10-CM

## 2013-01-20 LAB — CBC WITH DIFFERENTIAL/PLATELET
BASO%: 1.7 % (ref 0.0–2.0)
Basophils Absolute: 0 10*3/uL (ref 0.0–0.1)
EOS%: 5.6 % (ref 0.0–7.0)
Eosinophils Absolute: 0.1 10*3/uL (ref 0.0–0.5)
HCT: 34 % — ABNORMAL LOW (ref 38.4–49.9)
HGB: 12.2 g/dL — ABNORMAL LOW (ref 13.0–17.1)
LYMPH#: 0.5 10*3/uL — AB (ref 0.9–3.3)
LYMPH%: 25.1 % (ref 14.0–49.0)
MCH: 31.2 pg (ref 27.2–33.4)
MCHC: 35.9 g/dL (ref 32.0–36.0)
MCV: 87 fL (ref 79.3–98.0)
MONO#: 0.1 10*3/uL (ref 0.1–0.9)
MONO%: 7.3 % (ref 0.0–14.0)
NEUT#: 1.1 10*3/uL — ABNORMAL LOW (ref 1.5–6.5)
NEUT%: 60.3 % (ref 39.0–75.0)
Platelets: 126 10*3/uL — ABNORMAL LOW (ref 140–400)
RBC: 3.91 10*6/uL — ABNORMAL LOW (ref 4.20–5.82)
RDW: 13.1 % (ref 11.0–14.6)
WBC: 1.8 10*3/uL — AB (ref 4.0–10.3)
nRBC: 0 % (ref 0–0)

## 2013-01-20 LAB — COMPREHENSIVE METABOLIC PANEL (CC13)
ALT: 35 U/L (ref 0–55)
AST: 23 U/L (ref 5–34)
Albumin: 3.4 g/dL — ABNORMAL LOW (ref 3.5–5.0)
Alkaline Phosphatase: 133 U/L (ref 40–150)
Anion Gap: 7 mEq/L (ref 3–11)
BUN: 18.4 mg/dL (ref 7.0–26.0)
CALCIUM: 9.1 mg/dL (ref 8.4–10.4)
CHLORIDE: 102 meq/L (ref 98–109)
CO2: 29 mEq/L (ref 22–29)
Creatinine: 0.8 mg/dL (ref 0.7–1.3)
Glucose: 94 mg/dl (ref 70–140)
POTASSIUM: 4.1 meq/L (ref 3.5–5.1)
SODIUM: 138 meq/L (ref 136–145)
TOTAL PROTEIN: 6.7 g/dL (ref 6.4–8.3)
Total Bilirubin: 0.98 mg/dL (ref 0.20–1.20)

## 2013-01-27 ENCOUNTER — Other Ambulatory Visit (HOSPITAL_BASED_OUTPATIENT_CLINIC_OR_DEPARTMENT_OTHER): Payer: Medicare Other

## 2013-01-27 DIAGNOSIS — C7949 Secondary malignant neoplasm of other parts of nervous system: Secondary | ICD-10-CM

## 2013-01-27 DIAGNOSIS — C7931 Secondary malignant neoplasm of brain: Secondary | ICD-10-CM

## 2013-01-27 DIAGNOSIS — C3430 Malignant neoplasm of lower lobe, unspecified bronchus or lung: Secondary | ICD-10-CM

## 2013-01-27 LAB — CBC WITH DIFFERENTIAL/PLATELET
BASO%: 0.8 % (ref 0.0–2.0)
Basophils Absolute: 0 10*3/uL (ref 0.0–0.1)
EOS%: 1.9 % (ref 0.0–7.0)
Eosinophils Absolute: 0.1 10*3/uL (ref 0.0–0.5)
HCT: 31.8 % — ABNORMAL LOW (ref 38.4–49.9)
HGB: 11.2 g/dL — ABNORMAL LOW (ref 13.0–17.1)
LYMPH%: 18.8 % (ref 14.0–49.0)
MCH: 32.1 pg (ref 27.2–33.4)
MCHC: 35 g/dL (ref 32.0–36.0)
MCV: 91.7 fL (ref 79.3–98.0)
MONO#: 0.6 10*3/uL (ref 0.1–0.9)
MONO%: 22.4 % — ABNORMAL HIGH (ref 0.0–14.0)
NEUT#: 1.6 10*3/uL (ref 1.5–6.5)
NEUT%: 56.1 % (ref 39.0–75.0)
PLATELETS: 88 10*3/uL — AB (ref 140–400)
RBC: 3.47 10*6/uL — ABNORMAL LOW (ref 4.20–5.82)
RDW: 13.6 % (ref 11.0–14.6)
WBC: 2.8 10*3/uL — ABNORMAL LOW (ref 4.0–10.3)
lymph#: 0.5 10*3/uL — ABNORMAL LOW (ref 0.9–3.3)

## 2013-01-27 LAB — COMPREHENSIVE METABOLIC PANEL (CC13)
ALBUMIN: 3.4 g/dL — AB (ref 3.5–5.0)
ALT: 28 U/L (ref 0–55)
AST: 23 U/L (ref 5–34)
Alkaline Phosphatase: 155 U/L — ABNORMAL HIGH (ref 40–150)
Anion Gap: 10 mEq/L (ref 3–11)
BUN: 14.8 mg/dL (ref 7.0–26.0)
CALCIUM: 9 mg/dL (ref 8.4–10.4)
CHLORIDE: 105 meq/L (ref 98–109)
CO2: 27 mEq/L (ref 22–29)
Creatinine: 0.8 mg/dL (ref 0.7–1.3)
Glucose: 93 mg/dl (ref 70–140)
POTASSIUM: 4.3 meq/L (ref 3.5–5.1)
Sodium: 141 mEq/L (ref 136–145)
Total Bilirubin: 0.48 mg/dL (ref 0.20–1.20)
Total Protein: 6.5 g/dL (ref 6.4–8.3)

## 2013-01-30 ENCOUNTER — Encounter: Payer: Self-pay | Admitting: Radiation Oncology

## 2013-02-03 ENCOUNTER — Other Ambulatory Visit (HOSPITAL_BASED_OUTPATIENT_CLINIC_OR_DEPARTMENT_OTHER): Payer: Medicare Other

## 2013-02-03 ENCOUNTER — Ambulatory Visit (HOSPITAL_BASED_OUTPATIENT_CLINIC_OR_DEPARTMENT_OTHER): Payer: Medicare Other

## 2013-02-03 ENCOUNTER — Telehealth: Payer: Self-pay | Admitting: Internal Medicine

## 2013-02-03 ENCOUNTER — Telehealth: Payer: Self-pay | Admitting: *Deleted

## 2013-02-03 ENCOUNTER — Ambulatory Visit (HOSPITAL_BASED_OUTPATIENT_CLINIC_OR_DEPARTMENT_OTHER): Payer: Medicare Other | Admitting: Physician Assistant

## 2013-02-03 ENCOUNTER — Encounter: Payer: Self-pay | Admitting: Physician Assistant

## 2013-02-03 ENCOUNTER — Encounter: Payer: Self-pay | Admitting: Internal Medicine

## 2013-02-03 VITALS — BP 150/89 | HR 83 | Temp 97.6°F | Resp 18 | Ht 69.0 in | Wt 168.5 lb

## 2013-02-03 DIAGNOSIS — C3430 Malignant neoplasm of lower lobe, unspecified bronchus or lung: Secondary | ICD-10-CM

## 2013-02-03 DIAGNOSIS — Z5111 Encounter for antineoplastic chemotherapy: Secondary | ICD-10-CM

## 2013-02-03 DIAGNOSIS — C7931 Secondary malignant neoplasm of brain: Secondary | ICD-10-CM

## 2013-02-03 DIAGNOSIS — C3490 Malignant neoplasm of unspecified part of unspecified bronchus or lung: Secondary | ICD-10-CM

## 2013-02-03 DIAGNOSIS — C7949 Secondary malignant neoplasm of other parts of nervous system: Secondary | ICD-10-CM

## 2013-02-03 DIAGNOSIS — C7951 Secondary malignant neoplasm of bone: Secondary | ICD-10-CM

## 2013-02-03 DIAGNOSIS — C7952 Secondary malignant neoplasm of bone marrow: Secondary | ICD-10-CM

## 2013-02-03 LAB — CBC WITH DIFFERENTIAL/PLATELET
BASO%: 0.2 % (ref 0.0–2.0)
BASOS ABS: 0 10*3/uL (ref 0.0–0.1)
EOS%: 0 % (ref 0.0–7.0)
Eosinophils Absolute: 0 10*3/uL (ref 0.0–0.5)
HCT: 31.8 % — ABNORMAL LOW (ref 38.4–49.9)
HGB: 11.2 g/dL — ABNORMAL LOW (ref 13.0–17.1)
LYMPH%: 7.6 % — ABNORMAL LOW (ref 14.0–49.0)
MCH: 31.1 pg (ref 27.2–33.4)
MCHC: 35.2 g/dL (ref 32.0–36.0)
MCV: 88.3 fL (ref 79.3–98.0)
MONO#: 0.5 10*3/uL (ref 0.1–0.9)
MONO%: 7.8 % (ref 0.0–14.0)
NEUT#: 5.2 10*3/uL (ref 1.5–6.5)
NEUT%: 84.4 % — ABNORMAL HIGH (ref 39.0–75.0)
Platelets: 252 10*3/uL (ref 140–400)
RBC: 3.6 10*6/uL — AB (ref 4.20–5.82)
RDW: 14.7 % — AB (ref 11.0–14.6)
WBC: 6.2 10*3/uL (ref 4.0–10.3)
lymph#: 0.5 10*3/uL — ABNORMAL LOW (ref 0.9–3.3)

## 2013-02-03 LAB — COMPREHENSIVE METABOLIC PANEL (CC13)
ALBUMIN: 3.9 g/dL (ref 3.5–5.0)
ALK PHOS: 136 U/L (ref 40–150)
ALT: 27 U/L (ref 0–55)
AST: 26 U/L (ref 5–34)
Anion Gap: 10 mEq/L (ref 3–11)
BUN: 13.6 mg/dL (ref 7.0–26.0)
CO2: 27 mEq/L (ref 22–29)
Calcium: 9.7 mg/dL (ref 8.4–10.4)
Chloride: 103 mEq/L (ref 98–109)
Creatinine: 0.8 mg/dL (ref 0.7–1.3)
Glucose: 116 mg/dl (ref 70–140)
POTASSIUM: 3.8 meq/L (ref 3.5–5.1)
SODIUM: 140 meq/L (ref 136–145)
Total Bilirubin: 0.61 mg/dL (ref 0.20–1.20)
Total Protein: 7.5 g/dL (ref 6.4–8.3)

## 2013-02-03 MED ORDER — CYANOCOBALAMIN 1000 MCG/ML IJ SOLN
INTRAMUSCULAR | Status: AC
  Filled 2013-02-03: qty 1

## 2013-02-03 MED ORDER — SODIUM CHLORIDE 0.9 % IV SOLN
500.0000 mg/m2 | Freq: Once | INTRAVENOUS | Status: AC
  Administered 2013-02-03: 950 mg via INTRAVENOUS
  Filled 2013-02-03: qty 38

## 2013-02-03 MED ORDER — CYANOCOBALAMIN 1000 MCG/ML IJ SOLN
1000.0000 ug | Freq: Once | INTRAMUSCULAR | Status: AC
  Administered 2013-02-03: 1000 ug via INTRAMUSCULAR

## 2013-02-03 MED ORDER — SODIUM CHLORIDE 0.9 % IV SOLN
Freq: Once | INTRAVENOUS | Status: AC
  Administered 2013-02-03: 20 mL via INTRAVENOUS

## 2013-02-03 MED ORDER — SODIUM CHLORIDE 0.9 % IV SOLN
450.0000 mg | Freq: Once | INTRAVENOUS | Status: AC
  Administered 2013-02-03: 450 mg via INTRAVENOUS
  Filled 2013-02-03: qty 45

## 2013-02-03 MED ORDER — DEXAMETHASONE SODIUM PHOSPHATE 20 MG/5ML IJ SOLN
20.0000 mg | Freq: Once | INTRAMUSCULAR | Status: AC
  Administered 2013-02-03: 20 mg via INTRAVENOUS

## 2013-02-03 MED ORDER — DEXAMETHASONE SODIUM PHOSPHATE 20 MG/5ML IJ SOLN
INTRAMUSCULAR | Status: AC
  Filled 2013-02-03: qty 5

## 2013-02-03 MED ORDER — ONDANSETRON 16 MG/50ML IVPB (CHCC)
16.0000 mg | Freq: Once | INTRAVENOUS | Status: AC
  Administered 2013-02-03: 16 mg via INTRAVENOUS

## 2013-02-03 NOTE — Telephone Encounter (Signed)
Gave pt appt for lab,md CT and oral contrast for February 2015

## 2013-02-03 NOTE — Telephone Encounter (Signed)
Per staff phone call and POF I have schedueld appts.  JMW  

## 2013-02-03 NOTE — Progress Notes (Signed)
[name] [phone]   [phone]  SHARED VISIT PROGRESS NOTE  Preston Gainer, MD [place] [address]  DIAGNOSIS: Stage IV ( T2b, N2, M1b) non-small cell lung cancer, adenocarcinoma with areas of squamous differentiation presented with left lower lobe lung mass in addition to mediastinal lymphadenopathy and brain metastases diagnosed in October of 2014 [facility] molecular studies was positive for MET amplification. He is negative for ALK, BRAF, KRAS, EGFR, ERBB2 and RET  PRIOR THERAPY: 1. Stereotactic radiotherapy to 3 brain lesions under the care of Dr. Lisbeth Garcia completed 12/08/2012. 2. palliative radiotherapy to the left lower lobe lung mass under the care of Dr. Lisbeth Garcia expected to be completed 01/01/2013.  CURRENT THERAPY:  systemic chemotherapy with carboplatin for AUC of 5 and Alimta 500 mg/M2 every 3 weeks. First dose given on 12/23/2012. Status post 2 cycle.   CHEMOTHERAPY INTENT:Palliative  CURRENT # OF CHEMOTHERAPY CYCLES: 3  CURRENT ANTIEMETICS:Zofran, dexamethasone and Compazine  CURRENT SMOKING STATUS: former smoker  ORAL CHEMOTHERAPY AND CONSENT: None  CURRENT BISPHOSPHONATES USE: None  PAIN MANAGEMENT: 0/10  NARCOTICS INDUCED CONSTIPATION: None  LIVING WILL AND CODE STATUS: ?  INTERVAL HISTORY: [name] 80 y.o. male returns to the clinic today for follow up visit accompanied by his wife. The patient is feeling fine today with no specific complaints. The patient denied having any nausea or vomiting. He denied having any fever or chills. He has no chest pain, shortness breath, cough or hemoptysis. He has no weight loss or night sweats. He is tolerating his  chemotherapy relatively well the exception of some decreased appetite and mild nausea. He also some generalized malaise all of this occurring a few days after chemotherapy. He presents to proceed with cycle #3.  MEDICAL HISTORY: Past Medical  History  Diagnosis Date  . COPD (chronic obstructive pulmonary disease)   . Allergy     allergic rhinitis  . Cancer     Patient has a lung mass which is being evaluated  . Hx of radiation therapy 12/09/12-01/01/13    lung 37.5Gy    ALLERGIES:  is allergic to sulfa antibiotics.  MEDICATIONS:  Current Outpatient Prescriptions  Medication Sig Dispense Refill  . acetaminophen (TYLENOL) 500 MG tablet Take 500 mg by mouth every 6 (six) hours as needed.      . clobetasol cream (TEMOVATE) 5.20 % Apply 1 application topically daily as needed (for itching).      Marland Kitchen dexamethasone (DECADRON) 4 MG tablet 4 mg by mouth twice a day the day before, day of and day after the chemotherapy every 3 weeks  40 tablet  1  . folic acid (FOLVITE) 802 MCG tablet Take 0.5 tablets (400 mcg total) by mouth daily.  30 tablet  3  . Polyethyl Glycol-Propyl Glycol (SYSTANE OP) Place 1 drop into both eyes daily.      . simvastatin (ZOCOR) 10 MG tablet Take 1 tablet (10 mg total) by mouth at bedtime.  90 tablet  3  . sucralfate (CARAFATE) 1 G tablet Take 1 tablet (1 g total) by mouth 4 (four) times daily.  120 tablet  2  . prochlorperazine (COMPAZINE) 10 MG tablet Take 1 tablet (10 mg total) by mouth every 6 (six) hours as needed for nausea or vomiting.  60 tablet  0   No current facility-administered medications for this visit.    SURGICAL HISTORY:  Past Surgical History  Procedure Laterality Date  . Cholecystectomy  REVIEW OF SYSTEMS:  Constitutional: positive for malaise Eyes: negative Ears, nose, mouth, throat, and face: negative Respiratory: negative Cardiovascular: negative Gastrointestinal: positive for nausea Genitourinary:negative Integument/breast: negative Hematologic/lymphatic: negative Musculoskeletal:negative Neurological: negative Behavioral/Psych: negative Endocrine: negative Allergic/Immunologic: negative   PHYSICAL EXAMINATION: General appearance: alert, cooperative and no  distress Head: Normocephalic, without obvious abnormality, atraumatic Neck: no adenopathy, no JVD, supple, symmetrical, trachea midline and thyroid not enlarged, symmetric, no tenderness/mass/nodules Lymph nodes: Cervical, supraclavicular, and axillary nodes normal. Resp: clear to auscultation bilaterally Back: symmetric, no curvature. ROM normal. No CVA tenderness. Cardio: regular rate and rhythm, S1, S2 normal, no murmur, click, rub or gallop GI: soft, non-tender; bowel sounds normal; no masses,  no organomegaly Extremities: extremities normal, atraumatic, no cyanosis or edema Neurologic: Alert and oriented X 3, normal strength and tone. Normal symmetric reflexes. Normal coordination and gait  ECOG PERFORMANCE STATUS: 1 - Symptomatic but completely ambulatory  Blood pressure 150/89, pulse 83, temperature 97.6 F (36.4 C), temperature source Oral, resp. rate 18, height _0  (1.753 m), weight 168 lb 8 oz (76.431 kg), SpO2 98.00%.  LABORATORY DATA: Lab Results  Component Value Date   WBC 6.2 02/03/2013   HGB 11.2* 02/03/2013   HCT 31.8* 02/03/2013   MCV 88.3 02/03/2013   PLT 252 02/03/2013      Chemistry      Component Value Date/Time   NA 140 02/03/2013 1256   NA CANCELED 10/13/2012 1149   NA 138 06/13/2007 0420   K 3.8 02/03/2013 1256   K CANCELED 10/13/2012 1149   CL CANCELED 10/13/2012 1149   CO2 27 02/03/2013 1256   CO2 CANCELED 10/13/2012 1149   BUN 13.6 02/03/2013 1256   BUN CANCELED 10/13/2012 1149   BUN 10 06/13/2007 0420   CREATININE 0.8 02/03/2013 1256   CREATININE 1.00 11/10/2012 1431      Component Value Date/Time   CALCIUM 9.7 02/03/2013 1256   CALCIUM CANCELED 10/13/2012 1149   ALKPHOS 136 02/03/2013 1256   ALKPHOS CANCELED 10/13/2012 1149   AST 26 02/03/2013 1256   AST CANCELED 10/13/2012 1149   ALT 27 02/03/2013 1256   ALT CANCELED 10/13/2012 1149   BILITOT 0.61 02/03/2013 1256   BILITOT CANCELED 10/13/2012 1149       RADIOGRAPHIC STUDIES: Mr [name] Wo  Contrast  12/02/2012   CLINICAL DATA:  Metastatic lung cancer.  EXAM: MRI HEAD WITHOUT AND WITH CONTRAST  TECHNIQUE: Multiplanar, multiecho pulse sequences of the brain and surrounding structures were obtained without and with intravenous contrast. SRS protocol.  CONTRAST:  15 cc MultiHance  COMPARISON:  11/10/2012 brain MRI  FINDINGS: There is mild age-related cerebral volume loss. Again seen are scattered foci of T2 hyperintensity within the subcortical and deep cerebral white matter bilaterally, compatible with moderate chronic small vessel ischemic disease. Ring-enhancing metastasis in the medial right cerebellum measures 13 x 11 mm (series 10, image 45). Ring-enhancing lesion in the right post central gyrus and measures 8 x 7 mm (series 10, image 114). Ring-enhancing lesion in the medial left occipital lobe measures 7 x 5 mm (series 10, image 73). Allowing for differences in technique, these lesions do not appear significantly changed in size compared to recent routine brain MRI. There is very mild vasogenic edema associated with these lesions without mass effect. No new lesions are identified. There is no evidence of intracranial hemorrhage, midline shift, or extra-axial fluid collection. Major intracranial flow voids are unremarkable. Orbits and paranasal sinuses are unremarkable.  IMPRESSION: Unchanged appearance of 3 ring  enhancing brain lesions, consistent with metastases. No new lesions identified.   Electronically Signed   By: Logan Bores   On: 12/02/2012 15:44   Ct Biopsy  11/24/2012   CLINICAL DATA:  Hypermetabolic cavitary right lower lobe lung lesion.  EXAM: CT GUIDED CORE BIOPSY OF RIGHT LOWER LOBE LUNG LESION  ANESTHESIA/SEDATION: Intravenous Fentanyl and Versed were administered as conscious sedation during continuous cardiorespiratory monitoring by the radiology RN, with a total moderate sedation time of 16 minutes.  PROCEDURE: The procedure risks, benefits, and alternatives were explained  to the patient. Questions regarding the procedure were encouraged and answered. The patient understands and consents to the procedure. Patient placed in right lateral decubitus position. Limited axial scans obtained through the thorax. An appropriate skin entry site was determined.  The posterior thorax was prepped with Betadinein a sterile fashion, and a sterile drape was applied covering the operative field. A sterile gown and sterile gloves were used for the procedure. Local anesthesia was provided with 1% Lidocaine. Under CT fluoroscopic guidance, a 17 gauge trocar needle was advanced to the margin of the lesion. Once needle tip position was confirmed, coaxial 18-gauge core biopsy samples were obtained, submitted in formalin to surgical pathology. The guide needle was removed. Postprocedure scans show no pneumothorax or other apparent complication. The patient tolerated the procedure well.  Complications: None  : IMPRESSION:  Technically successful CT-guided core biopsy of right lower lobe lung lesion. Surveillance and post procedure radiography scheduled.   Electronically Signed   By: Arne Cleveland M.D.   On: 11/24/2012 10:46   Dg Chest Port 1 View  11/24/2012   CLINICAL DATA:  Right lung biopsy.  EXAM: PORTABLE CHEST - 1 VIEW  COMPARISON:  CT biopsy images 11 10/2012.  Chest x-ray 10/13/2012.  FINDINGS: Mediastinum is normal. Heart size normal. No pneumothorax. Changes of COPD. Infiltrative changes of the right lower lobe. Hemidiaphragms are not completely imaged. Next feel  IMPRESSION: No evidence of pneumothorax post biopsy.   Electronically Signed   By: Marcello Moores  Register   On: 11/24/2012 11:17    ASSESSMENT AND PLAN: this is a very pleasant 80 years old white male with metastatic non-small cell lung cancer, adenocarcinoma with brain metastasis status post stereotactic radiotherapy to the brain lesions and currently undergoing palliative radiotherapy to the left lower lobe lung mass. His molecular  biomarkers revealed that he was positive for MET amplification. He is negative for ALK, BRAF, KRAS, EGFR, ERBB2 and RET. He is being treated with palliative systemic chemotherapy with carboplatin for AUC of 5 and Alimta 500 mg/M2 every 3 weeks. Status post 2 cycles. Patient was discussed with and also seen by Dr. Julien Nordmann. He will continue with weekly labs as scheduled. He'll follow with Dr. Julien Nordmann in 3 weeks with a restaging CT scan of the chest, abdomen and pelvis with contrast to reevaluate his disease. and followup in 3 weeks prior to the start of cycle #3. Patient was advised to monitor his headache symptoms. Also advised to increase his fluid intake.   The patient voices understanding of current disease status and treatment options and is in agreement with the current care plan.  All questions were answered. The patient knows to call the clinic with any problems, questions or concerns. We can certainly see the patient much sooner if necessary.  Carlton Adam PA-C  ADDENDUM:  Hematology/Oncology Attending:  I had the face to face encounter with the patient today. I recommended his care plan. This is a very  pleasant 80 years old white male with history of stage IV non-small cell lung cancer currently undergoing systemic chemotherapy with carboplatin and Alimta status post 2 cycles. The patient is feeling fine and tolerating his treatment fairly well with no significant adverse effects. I recommended for him to proceed with cycle #3 today as scheduled. He would come back for followup visit in 3 weeks with the next cycle of his treatment after repeating CT scan of the chest, abdomen and pelvis for restaging of his disease. He was advised to call immediately if he has any concerning symptoms in the interval.  Disclaimer: This note was dictated with voice recognition software. Similar sounding words can inadvertently be transcribed and may not be corrected upon review. Eilleen Kempf.,  MD 02/04/2013

## 2013-02-05 ENCOUNTER — Ambulatory Visit
Admission: RE | Admit: 2013-02-05 | Discharge: 2013-02-05 | Disposition: A | Discharge status: Home / Self Care | Payer: Medicare Other | Source: Ambulatory Visit | Attending: Radiation Oncology | Admitting: Radiation Oncology

## 2013-02-05 ENCOUNTER — Encounter: Payer: Self-pay | Admitting: Radiation Oncology

## 2013-02-05 VITALS — BP 121/69 | HR 62 | Resp 20 | Ht 69.0 in | Wt 167.6 lb

## 2013-02-05 DIAGNOSIS — C3430 Malignant neoplasm of lower lobe, unspecified bronchus or lung: Secondary | ICD-10-CM

## 2013-02-05 DIAGNOSIS — C7931 Secondary malignant neoplasm of brain: Secondary | ICD-10-CM

## 2013-02-05 HISTORY — DX: Personal history of irradiation: Z92.3

## 2013-02-05 NOTE — Progress Notes (Addendum)
Follow up lung rad txs 12/09/12-01/01/13, no c/o pain , had chemotherapy on Tuesday this week, no c/o nausea, no coughing, sob woccasinally, 100% room air sats, some fatigue after chemotherapy Appetite good, scheduled CT on 02/20/13 from Dr. Julien Nordmann 11:55 AM

## 2013-02-05 NOTE — Progress Notes (Signed)
  Radiation Oncology         [phone] ________________________________  Name: [name] MRN: [id number]  Date: 02/05/2013  DOB: 26-Feb-1933  Follow-Up Visit Note  CC: Preston Gainer, MD  Preston Pollack, MD  Diagnosis:   Metastatic non-small cell lung cancer  Interval Since Last Radiation:  One month   Narrative:  The patient returns today for routine follow-up.  The patient returns to clinic today for followup having completed palliative thoracic radiation treatment on 01/01/2013. He states that he has done well since he finished treatment. The patient has continued with chemotherapy. Overall he states that this has gone well. No major difficulties. The patient is due for restaging CT scans in a couple of weeks.                              ALLERGIES:  is allergic to sulfa antibiotics.  Meds: Current Outpatient Prescriptions  Medication Sig Dispense Refill  . clobetasol cream (TEMOVATE) 1.60 % Apply 1 application topically daily as needed (for itching).      Marland Kitchen dexamethasone (DECADRON) 4 MG tablet 4 mg by mouth twice a day the day before, day of and day after the chemotherapy every 3 weeks  40 tablet  1  . folic acid (FOLVITE) 109 MCG tablet Take 0.5 tablets (400 mcg total) by mouth daily.  30 tablet  3  . Polyethyl Glycol-Propyl Glycol (SYSTANE OP) Place 1 drop into both eyes daily.      . simvastatin (ZOCOR) 10 MG tablet Take 1 tablet (10 mg total) by mouth at bedtime.  90 tablet  3  . acetaminophen (TYLENOL) 500 MG tablet Take 500 mg by mouth every 6 (six) hours as needed.      . prochlorperazine (COMPAZINE) 10 MG tablet Take 1 tablet (10 mg total) by mouth every 6 (six) hours as needed for nausea or vomiting.  60 tablet  0   No current facility-administered medications for this encounter.    Physical Findings: The patient is in no acute distress. Patient is alert and oriented.  height is 5\' 9"  (1.753 m) and weight is 167 lb 9.6 oz (76.023 kg). His blood pressure is 121/69  and his pulse is 62. His respiration is 20 and oxygen saturation is 100%. .   General: Well-developed, in no acute distress HEENT: Normocephalic, atraumatic Cardiovascular: Regular rate and rhythm Respiratory: Clear to auscultation bilaterally GI: Soft, nontender, normal bowel sounds Extremities: No edema present   Lab Findings: Lab Results  Component Value Date   WBC 6.2 02/03/2013   HGB 11.2* 02/03/2013   HCT 31.8* 02/03/2013   MCV 88.3 02/03/2013   PLT 252 02/03/2013     Radiographic Findings: No results found.  Impression:    The patient is doing well clinically at this time. No major difficulties and continuing with chemotherapy. No significant esophagitis.  Plan:  The patient will undergo an MRI scan of the brain 3 months after he completed his course of radiosurgery per our standard brain program followup schedule.    [name], M.D., Ph.D.

## 2013-02-10 ENCOUNTER — Telehealth: Payer: Self-pay | Admitting: Internal Medicine

## 2013-02-10 ENCOUNTER — Other Ambulatory Visit (HOSPITAL_BASED_OUTPATIENT_CLINIC_OR_DEPARTMENT_OTHER): Payer: Medicare Other

## 2013-02-10 ENCOUNTER — Other Ambulatory Visit: Payer: Self-pay | Admitting: *Deleted

## 2013-02-10 DIAGNOSIS — C3430 Malignant neoplasm of lower lobe, unspecified bronchus or lung: Secondary | ICD-10-CM

## 2013-02-10 DIAGNOSIS — C7931 Secondary malignant neoplasm of brain: Secondary | ICD-10-CM

## 2013-02-10 DIAGNOSIS — C7949 Secondary malignant neoplasm of other parts of nervous system: Secondary | ICD-10-CM

## 2013-02-10 LAB — CBC WITH DIFFERENTIAL/PLATELET
BASO%: 1.6 % (ref 0.0–2.0)
BASOS ABS: 0 10*3/uL (ref 0.0–0.1)
EOS%: 3 % (ref 0.0–7.0)
Eosinophils Absolute: 0.1 10*3/uL (ref 0.0–0.5)
HCT: 32.8 % — ABNORMAL LOW (ref 38.4–49.9)
HGB: 11.7 g/dL — ABNORMAL LOW (ref 13.0–17.1)
LYMPH%: 22.7 % (ref 14.0–49.0)
MCH: 32.5 pg (ref 27.2–33.4)
MCHC: 35.5 g/dL (ref 32.0–36.0)
MCV: 91.6 fL (ref 79.3–98.0)
MONO#: 0.3 10*3/uL (ref 0.1–0.9)
MONO%: 10.4 % (ref 0.0–14.0)
NEUT#: 1.6 10*3/uL (ref 1.5–6.5)
NEUT%: 62.3 % (ref 39.0–75.0)
Platelets: 172 10*3/uL (ref 140–400)
RBC: 3.58 10*6/uL — ABNORMAL LOW (ref 4.20–5.82)
RDW: 15.4 % — AB (ref 11.0–14.6)
WBC: 2.6 10*3/uL — ABNORMAL LOW (ref 4.0–10.3)
lymph#: 0.6 10*3/uL — ABNORMAL LOW (ref 0.9–3.3)

## 2013-02-10 LAB — COMPREHENSIVE METABOLIC PANEL (CC13)
ALBUMIN: 3.6 g/dL (ref 3.5–5.0)
ALK PHOS: 122 U/L (ref 40–150)
ALT: 23 U/L (ref 0–55)
AST: 22 U/L (ref 5–34)
Anion Gap: 8 mEq/L (ref 3–11)
BUN: 16.9 mg/dL (ref 7.0–26.0)
CO2: 27 mEq/L (ref 22–29)
CREATININE: 0.8 mg/dL (ref 0.7–1.3)
Calcium: 9.3 mg/dL (ref 8.4–10.4)
Chloride: 100 mEq/L (ref 98–109)
Glucose: 102 mg/dl (ref 70–140)
POTASSIUM: 4.5 meq/L (ref 3.5–5.1)
Sodium: 135 mEq/L — ABNORMAL LOW (ref 136–145)
Total Bilirubin: 1.12 mg/dL (ref 0.20–1.20)
Total Protein: 6.8 g/dL (ref 6.4–8.3)

## 2013-02-10 NOTE — Telephone Encounter (Signed)
returned pt call and lvm r/s with appt r/s to 1.28 per reqest...advised to call back if appt does not suit

## 2013-02-11 ENCOUNTER — Other Ambulatory Visit: Payer: Medicare Other

## 2013-02-12 ENCOUNTER — Other Ambulatory Visit: Payer: Self-pay | Admitting: *Deleted

## 2013-02-17 ENCOUNTER — Other Ambulatory Visit (HOSPITAL_BASED_OUTPATIENT_CLINIC_OR_DEPARTMENT_OTHER): Payer: Medicare Other

## 2013-02-17 DIAGNOSIS — C3430 Malignant neoplasm of lower lobe, unspecified bronchus or lung: Secondary | ICD-10-CM

## 2013-02-17 LAB — CBC WITH DIFFERENTIAL/PLATELET
BASO%: 0.3 % (ref 0.0–2.0)
BASOS ABS: 0 10*3/uL (ref 0.0–0.1)
EOS ABS: 0 10*3/uL (ref 0.0–0.5)
EOS%: 0.7 % (ref 0.0–7.0)
HCT: 29 % — ABNORMAL LOW (ref 38.4–49.9)
HEMOGLOBIN: 10.1 g/dL — AB (ref 13.0–17.1)
LYMPH%: 16.4 % (ref 14.0–49.0)
MCH: 32.7 pg (ref 27.2–33.4)
MCHC: 35 g/dL (ref 32.0–36.0)
MCV: 93.3 fL (ref 79.3–98.0)
MONO#: 0.7 10*3/uL (ref 0.1–0.9)
MONO%: 20.6 % — AB (ref 0.0–14.0)
NEUT%: 62 % (ref 39.0–75.0)
NEUTROS ABS: 2.2 10*3/uL (ref 1.5–6.5)
PLATELETS: 80 10*3/uL — AB (ref 140–400)
RBC: 3.1 10*6/uL — ABNORMAL LOW (ref 4.20–5.82)
RDW: 15.4 % — ABNORMAL HIGH (ref 11.0–14.6)
WBC: 3.5 10*3/uL — ABNORMAL LOW (ref 4.0–10.3)
lymph#: 0.6 10*3/uL — ABNORMAL LOW (ref 0.9–3.3)

## 2013-02-17 LAB — COMPREHENSIVE METABOLIC PANEL (CC13)
ALK PHOS: 123 U/L (ref 40–150)
ALT: 22 U/L (ref 0–55)
AST: 20 U/L (ref 5–34)
Albumin: 3.5 g/dL (ref 3.5–5.0)
Anion Gap: 8 mEq/L (ref 3–11)
BILIRUBIN TOTAL: 0.73 mg/dL (ref 0.20–1.20)
BUN: 11.4 mg/dL (ref 7.0–26.0)
CO2: 28 mEq/L (ref 22–29)
Calcium: 9.1 mg/dL (ref 8.4–10.4)
Chloride: 104 mEq/L (ref 98–109)
Creatinine: 0.8 mg/dL (ref 0.7–1.3)
Glucose: 76 mg/dl (ref 70–140)
Potassium: 3.7 mEq/L (ref 3.5–5.1)
Sodium: 140 mEq/L (ref 136–145)
Total Protein: 6.4 g/dL (ref 6.4–8.3)

## 2013-02-19 ENCOUNTER — Other Ambulatory Visit: Payer: Self-pay | Admitting: Radiation Therapy

## 2013-02-19 DIAGNOSIS — C7931 Secondary malignant neoplasm of brain: Secondary | ICD-10-CM

## 2013-02-19 DIAGNOSIS — C7949 Secondary malignant neoplasm of other parts of nervous system: Principal | ICD-10-CM

## 2013-02-20 ENCOUNTER — Encounter (HOSPITAL_COMMUNITY): Payer: Self-pay

## 2013-02-20 ENCOUNTER — Ambulatory Visit (HOSPITAL_COMMUNITY)
Admission: RE | Admit: 2013-02-20 | Discharge: 2013-02-20 | Disposition: A | Discharge status: Home / Self Care | Payer: Medicare Other | Source: Ambulatory Visit | Attending: Physician Assistant | Admitting: Physician Assistant

## 2013-02-20 DIAGNOSIS — M51379 Other intervertebral disc degeneration, lumbosacral region without mention of lumbar back pain or lower extremity pain: Secondary | ICD-10-CM | POA: Insufficient documentation

## 2013-02-20 DIAGNOSIS — J189 Pneumonia, unspecified organism: Secondary | ICD-10-CM | POA: Insufficient documentation

## 2013-02-20 DIAGNOSIS — N200 Calculus of kidney: Secondary | ICD-10-CM | POA: Insufficient documentation

## 2013-02-20 DIAGNOSIS — C3490 Malignant neoplasm of unspecified part of unspecified bronchus or lung: Secondary | ICD-10-CM | POA: Insufficient documentation

## 2013-02-20 DIAGNOSIS — R0602 Shortness of breath: Secondary | ICD-10-CM | POA: Insufficient documentation

## 2013-02-20 DIAGNOSIS — Q762 Congenital spondylolisthesis: Secondary | ICD-10-CM | POA: Insufficient documentation

## 2013-02-20 DIAGNOSIS — N400 Enlarged prostate without lower urinary tract symptoms: Secondary | ICD-10-CM | POA: Insufficient documentation

## 2013-02-20 DIAGNOSIS — Z79899 Other long term (current) drug therapy: Secondary | ICD-10-CM | POA: Insufficient documentation

## 2013-02-20 DIAGNOSIS — J438 Other emphysema: Secondary | ICD-10-CM | POA: Insufficient documentation

## 2013-02-20 DIAGNOSIS — R222 Localized swelling, mass and lump, trunk: Secondary | ICD-10-CM | POA: Insufficient documentation

## 2013-02-20 DIAGNOSIS — K8689 Other specified diseases of pancreas: Secondary | ICD-10-CM | POA: Insufficient documentation

## 2013-02-20 DIAGNOSIS — R634 Abnormal weight loss: Secondary | ICD-10-CM | POA: Insufficient documentation

## 2013-02-20 DIAGNOSIS — K5710 Diverticulosis of small intestine without perforation or abscess without bleeding: Secondary | ICD-10-CM | POA: Insufficient documentation

## 2013-02-20 DIAGNOSIS — J984 Other disorders of lung: Secondary | ICD-10-CM | POA: Insufficient documentation

## 2013-02-20 DIAGNOSIS — Z9089 Acquired absence of other organs: Secondary | ICD-10-CM | POA: Insufficient documentation

## 2013-02-20 DIAGNOSIS — I2510 Atherosclerotic heart disease of native coronary artery without angina pectoris: Secondary | ICD-10-CM | POA: Insufficient documentation

## 2013-02-20 DIAGNOSIS — M5137 Other intervertebral disc degeneration, lumbosacral region: Secondary | ICD-10-CM | POA: Insufficient documentation

## 2013-02-20 MED ORDER — IOHEXOL 300 MG/ML  SOLN
100.0000 mL | Freq: Once | INTRAMUSCULAR | Status: AC | PRN
  Administered 2013-02-20: 100 mL via INTRAVENOUS

## 2013-02-24 ENCOUNTER — Ambulatory Visit (HOSPITAL_BASED_OUTPATIENT_CLINIC_OR_DEPARTMENT_OTHER): Payer: Medicare Other | Admitting: Internal Medicine

## 2013-02-24 ENCOUNTER — Encounter: Payer: Self-pay | Admitting: Internal Medicine

## 2013-02-24 ENCOUNTER — Other Ambulatory Visit (HOSPITAL_BASED_OUTPATIENT_CLINIC_OR_DEPARTMENT_OTHER): Payer: Medicare Other

## 2013-02-24 ENCOUNTER — Telehealth: Payer: Self-pay | Admitting: Internal Medicine

## 2013-02-24 ENCOUNTER — Ambulatory Visit (HOSPITAL_BASED_OUTPATIENT_CLINIC_OR_DEPARTMENT_OTHER): Payer: Medicare Other

## 2013-02-24 VITALS — BP 154/80 | HR 74 | Temp 97.4°F | Resp 19 | Ht 69.0 in | Wt 170.8 lb

## 2013-02-24 DIAGNOSIS — C3430 Malignant neoplasm of lower lobe, unspecified bronchus or lung: Secondary | ICD-10-CM

## 2013-02-24 DIAGNOSIS — C7949 Secondary malignant neoplasm of other parts of nervous system: Secondary | ICD-10-CM

## 2013-02-24 DIAGNOSIS — C7931 Secondary malignant neoplasm of brain: Secondary | ICD-10-CM

## 2013-02-24 DIAGNOSIS — Z5111 Encounter for antineoplastic chemotherapy: Secondary | ICD-10-CM

## 2013-02-24 DIAGNOSIS — C3490 Malignant neoplasm of unspecified part of unspecified bronchus or lung: Secondary | ICD-10-CM

## 2013-02-24 LAB — COMPREHENSIVE METABOLIC PANEL (CC13)
ALK PHOS: 126 U/L (ref 40–150)
ALT: 27 U/L (ref 0–55)
ANION GAP: 9 meq/L (ref 3–11)
AST: 29 U/L (ref 5–34)
Albumin: 3.7 g/dL (ref 3.5–5.0)
BILIRUBIN TOTAL: 0.66 mg/dL (ref 0.20–1.20)
BUN: 9.4 mg/dL (ref 7.0–26.0)
CO2: 25 mEq/L (ref 22–29)
Calcium: 9.3 mg/dL (ref 8.4–10.4)
Chloride: 106 mEq/L (ref 98–109)
Creatinine: 0.7 mg/dL (ref 0.7–1.3)
Glucose: 122 mg/dl (ref 70–140)
Potassium: 3.7 mEq/L (ref 3.5–5.1)
SODIUM: 140 meq/L (ref 136–145)
TOTAL PROTEIN: 6.9 g/dL (ref 6.4–8.3)

## 2013-02-24 LAB — CBC WITH DIFFERENTIAL/PLATELET
BASO%: 0.2 % (ref 0.0–2.0)
Basophils Absolute: 0 10*3/uL (ref 0.0–0.1)
EOS ABS: 0 10*3/uL (ref 0.0–0.5)
EOS%: 0 % (ref 0.0–7.0)
HEMATOCRIT: 30.6 % — AB (ref 38.4–49.9)
HGB: 10.8 g/dL — ABNORMAL LOW (ref 13.0–17.1)
LYMPH%: 8.6 % — ABNORMAL LOW (ref 14.0–49.0)
MCH: 32 pg (ref 27.2–33.4)
MCHC: 35.3 g/dL (ref 32.0–36.0)
MCV: 90.5 fL (ref 79.3–98.0)
MONO#: 0.6 10*3/uL (ref 0.1–0.9)
MONO%: 10.5 % (ref 0.0–14.0)
NEUT#: 4.6 10*3/uL (ref 1.5–6.5)
NEUT%: 80.7 % — AB (ref 39.0–75.0)
Platelets: 194 10*3/uL (ref 140–400)
RBC: 3.38 10*6/uL — AB (ref 4.20–5.82)
RDW: 16.5 % — ABNORMAL HIGH (ref 11.0–14.6)
WBC: 5.7 10*3/uL (ref 4.0–10.3)
lymph#: 0.5 10*3/uL — ABNORMAL LOW (ref 0.9–3.3)

## 2013-02-24 MED ORDER — DEXAMETHASONE SODIUM PHOSPHATE 20 MG/5ML IJ SOLN
20.0000 mg | Freq: Once | INTRAMUSCULAR | Status: AC
  Administered 2013-02-24: 20 mg via INTRAVENOUS

## 2013-02-24 MED ORDER — SODIUM CHLORIDE 0.9 % IV SOLN
Freq: Once | INTRAVENOUS | Status: AC
  Administered 2013-02-24: 12:00:00 via INTRAVENOUS

## 2013-02-24 MED ORDER — SODIUM CHLORIDE 0.9 % IV SOLN
500.0000 mg/m2 | Freq: Once | INTRAVENOUS | Status: AC
  Administered 2013-02-24: 950 mg via INTRAVENOUS
  Filled 2013-02-24: qty 38

## 2013-02-24 MED ORDER — ONDANSETRON 16 MG/50ML IVPB (CHCC)
16.0000 mg | Freq: Once | INTRAVENOUS | Status: AC
  Administered 2013-02-24: 16 mg via INTRAVENOUS

## 2013-02-24 MED ORDER — SODIUM CHLORIDE 0.9 % IV SOLN
453.5000 mg | Freq: Once | INTRAVENOUS | Status: AC
  Administered 2013-02-24: 450 mg via INTRAVENOUS
  Filled 2013-02-24: qty 45

## 2013-02-24 MED ORDER — ONDANSETRON 16 MG/50ML IVPB (CHCC)
INTRAVENOUS | Status: AC
  Filled 2013-02-24: qty 16

## 2013-02-24 MED ORDER — DEXAMETHASONE SODIUM PHOSPHATE 20 MG/5ML IJ SOLN
INTRAMUSCULAR | Status: AC
  Filled 2013-02-24: qty 5

## 2013-02-24 NOTE — Telephone Encounter (Signed)
gv pt appt schedule for feb/march.

## 2013-02-24 NOTE — Progress Notes (Signed)
[facility] [phone]   [phone]  OFFICE PROGRESS NOTE  [name], MD [place] [address]  DIAGNOSIS: Stage IV ( T2b, N2, M1b) non-small cell lung cancer, adenocarcinoma with areas of squamous differentiation with negative EGFR mutation and negative ALK gene translocation, presented with left lower lobe lung mass in addition to mediastinal lymphadenopathy and brain metastases diagnosed in October of 2014  PRIOR THERAPY: 1) Stereotactic radiotherapy to 3 brain lesions under the care of Dr. Lisbeth Renshaw completed 12/08/2012. 2) Palliative radiotherapy to the left lower lobe lung mass under the care of Dr. Lisbeth Renshaw expected to be completed 01/01/2013.  CURRENT THERAPY: Systemic chemotherapy with carboplatin for AUC of 5 and Alimta 500 mg/M2 every 3 weeks. First dose 12/23/2012. Status post 3 cycles   CHEMOTHERAPY INTENT:Palliative  CURRENT # OF CHEMOTHERAPY CYCLES: 4  CURRENT ANTIEMETICS:Zofran, dexamethasone and Compazine  CURRENT SMOKING STATUS: former smoker  ORAL CHEMOTHERAPY AND CONSENT: None  CURRENT BISPHOSPHONATES USE: None  PAIN MANAGEMENT: 0/10  NARCOTICS INDUCED CONSTIPATION: None  LIVING WILL AND CODE STATUS: ?  INTERVAL HISTORY: [name] 80 y.o. male returns to the clinic today for follow up visit accompanied by his wife. The patient is feeling fine today with no specific complaints except for mild fatigue. He is tolerating his systemic chemotherapy with carboplatin and Alimta fairly well. The patient denied having any nausea or vomiting. He denied having any fever or chills. He has no chest pain, shortness breath, cough or hemoptysis. He has no weight loss or night sweats. He had repeat CT scan of the chest, abdomen and pelvis for restaging of his disease and he is here for evaluation and discussion of his scan results.  MEDICAL HISTORY: Past Medical History  Diagnosis Date  . COPD (chronic obstructive  pulmonary disease)   . Allergy     allergic rhinitis  . Hx of radiation therapy 12/09/12-01/01/13    lung 37.5Gy  . nscl ca w/ brain mets dx'd 08/2012    Patient has a lung mass which is being evaluated    ALLERGIES:  is allergic to sulfa antibiotics.  MEDICATIONS:  Current Outpatient Prescriptions  Medication Sig Dispense Refill  . acetaminophen (TYLENOL) 500 MG tablet Take 500 mg by mouth every 6 (six) hours as needed.      . clobetasol cream (TEMOVATE) 0.17 % Apply 1 application topically daily as needed (for itching).      Marland Kitchen dexamethasone (DECADRON) 4 MG tablet 4 mg by mouth twice a day the day before, day of and day after the chemotherapy every 3 weeks  40 tablet  1  . folic acid (FOLVITE) 494 MCG tablet Take 0.5 tablets (400 mcg total) by mouth daily.  30 tablet  3  . nystatin (MYCOSTATIN) 100000 UNIT/ML suspension Take 5 mLs by mouth 4 (four) times daily. Used for thrush      . Polyethyl Glycol-Propyl Glycol (SYSTANE OP) Place 1 drop into both eyes daily.      . prochlorperazine (COMPAZINE) 10 MG tablet Take 1 tablet (10 mg total) by mouth every 6 (six) hours as needed for nausea or vomiting.  60 tablet  0  . simvastatin (ZOCOR) 10 MG tablet Take 1 tablet (10 mg total) by mouth at bedtime.  90 tablet  3   No current facility-administered medications for this visit.    SURGICAL HISTORY:  Past Surgical History  Procedure Laterality Date  . Cholecystectomy      REVIEW  OF SYSTEMS:  Constitutional: negative Eyes: negative Ears, nose, mouth, throat, and face: negative Respiratory: negative Cardiovascular: negative Gastrointestinal: negative Genitourinary:negative Integument/breast: negative Hematologic/lymphatic: negative Musculoskeletal:negative Neurological: negative Behavioral/Psych: negative Endocrine: negative Allergic/Immunologic: negative   PHYSICAL EXAMINATION: General appearance: alert, cooperative and no distress Head: Normocephalic, without obvious  abnormality, atraumatic Neck: no adenopathy, no JVD, supple, symmetrical, trachea midline and thyroid not enlarged, symmetric, no tenderness/mass/nodules Lymph nodes: Cervical, supraclavicular, and axillary nodes normal. Resp: clear to auscultation bilaterally Back: symmetric, no curvature. ROM normal. No CVA tenderness. Cardio: regular rate and rhythm, S1, S2 normal, no murmur, click, rub or gallop GI: soft, non-tender; bowel sounds normal; no masses,  no organomegaly Extremities: extremities normal, atraumatic, no cyanosis or edema Neurologic: Alert and oriented X 3, normal strength and tone. Normal symmetric reflexes. Normal coordination and gait  ECOG PERFORMANCE STATUS: 1 - Symptomatic but completely ambulatory  Blood pressure 154/80, pulse 74, temperature 97.4 F (36.3 C), temperature source Oral, resp. rate 19, height 5\' 9"  (1.753 m), weight 170 lb 12.8 oz (77.474 kg), SpO2 100.00%.  LABORATORY DATA: Lab Results  Component Value Date   WBC 5.7 02/24/2013   HGB 10.8* 02/24/2013   HCT 30.6* 02/24/2013   MCV 90.5 02/24/2013   PLT 194 02/24/2013      Chemistry      Component Value Date/Time   NA 140 02/17/2013 1041   NA CANCELED 10/13/2012 1149   NA 138 06/13/2007 0420   K 3.7 02/17/2013 1041   K CANCELED 10/13/2012 1149   CL CANCELED 10/13/2012 1149   CO2 28 02/17/2013 1041   CO2 CANCELED 10/13/2012 1149   BUN 11.4 02/17/2013 1041   BUN CANCELED 10/13/2012 1149   BUN 10 06/13/2007 0420   CREATININE 0.8 02/17/2013 1041   CREATININE 1.00 11/10/2012 1431      Component Value Date/Time   CALCIUM 9.1 02/17/2013 1041   CALCIUM CANCELED 10/13/2012 1149   ALKPHOS 123 02/17/2013 1041   ALKPHOS CANCELED 10/13/2012 1149   AST 20 02/17/2013 1041   AST CANCELED 10/13/2012 1149   ALT 22 02/17/2013 1041   ALT CANCELED 10/13/2012 1149   BILITOT 0.73 02/17/2013 1041   BILITOT CANCELED 10/13/2012 1149       RADIOGRAPHIC STUDIES:   Ct Chest W Contrast  02/20/2013   CLINICAL DATA:  Restaging metastatic  non-small cell lung cancer. Ongoing chemotherapy. Weight loss and shortness of breath.  EXAM: CT CHEST, ABDOMEN, AND PELVIS WITH CONTRAST  TECHNIQUE: Multidetector CT imaging of the chest, abdomen and pelvis was performed following the standard protocol during bolus administration of intravenous contrast.  CONTRAST:  04/20/2013 OMNIPAQUE IOHEXOL 300 MG/ML  SOLN  COMPARISON:  NM PET IMAGE INITIAL (PI) SKULL BASE TO THIGH dated 11/04/2012; CT CHEST W/CM dated 10/14/2012; CT ABDOMEN WO/W CM dated 06/10/2007  FINDINGS:   CT CHEST FINDINGS  No pathologically enlarged mediastinal, hilar or axillary lymph nodes. Atherosclerotic calcification of the arterial vasculature, including coronary arteries. Heart size within normal limits. No pericardial effusion.  Biapical pleural parenchymal scarring. Centrilobular emphysema. Subpleural opacity in the posterior aspect of the right upper lobe is unchanged. Right lower lobe mass measures 2.9 x 4.6 cm (previously 3.8 x 6.0 cm). There is a cavitary component to the mass, with a small air-fluid level (image 38). Mild surrounding inflammatory ground-glass, improved somewhat from 10/14/2012. Calcified granuloma in the left lower lobe. Probable subpleural lymph node along the left major fissure (image 32), unchanged. No pleural fluid. There is narrowing of the distal left lower lobe  bronchus. Airway is otherwise unremarkable.    CT ABDOMEN AND PELVIS FINDINGS  Left hepatic lobe margin is irregular. Cholecystectomy. Adrenal glands are unremarkable. Tiny stone in the lower pole right kidney. Kidneys and spleen are unremarkable. There are scattered tiny calcifications associated with the periphery of the pancreas. Pancreas, stomach and small bowel are unremarkable with the exception of incidentally noted duodenal diverticula. Colon is unremarkable.  Prostate is enlarged. Atherosclerotic calcification of the arterial vasculature without abdominal aortic aneurysm. No pathologically enlarged  lymph nodes. No free fluid. No worrisome lytic or sclerotic lesions. Degenerative changes are seen in the spine. Advanced degenerative disc disease at L5-S1 with minimal grade 1 anterolisthesis.   IMPRESSION: 1. Interval decrease in size of a cavitary right lower lobe mass with improving postobstructive pneumonitis in the right lower lobe. 2. No evidence of metastatic disease in the abdomen, pelvis or bones. 3. Marginal irregularity of the liver is indicative of cirrhosis. Please correlate clinically. 4. Tiny right renal stone. 5. Question mild changes of chronic calcific pancreatitis. 6. Enlarged prostate.   Electronically Signed   By: Lorin Picket M.D.   On: 02/20/2013 15:12   ASSESSMENT AND PLAN: this is a very pleasant 80 years old white male with metastatic non-small cell lung cancer, adenocarcinoma with brain metastasis status post stereotactic radiotherapy to the brain lesions followed by palliative radiotherapy to the left lower lobe lung mass. The patient is currently undergoing systemic chemotherapy with carboplatin and Alimta status post 3 cycles. He is rating his treatment fairly well. The recent CT scan of the chest, abdomen and pelvis showed improvement in his disease. I discussed the scan results with the patient and his wife and showed them the images. I recommended for him to continue systemic chemotherapy with the same treatment regimen He would come back for follow up visit in 3 weeks for evaluation before starting cycle #5.  The patient voices understanding of current disease status and treatment options and is in agreement with the current care plan.  All questions were answered. The patient knows to call the clinic with any problems, questions or concerns. We can certainly see the patient much sooner if necessary.  I spent 15 minutes counseling the patient face to face. The total time spent in the appointment was 25 minutes.  Disclaimer: This note was dictated with voice  recognition software. Similar sounding words can inadvertently be transcribed and may not be corrected upon review.

## 2013-02-24 NOTE — Patient Instructions (Signed)
[facility] Discharge Instructions for Patients Receiving Chemotherapy  Today you received the following chemotherapy agents: Alimta, Carboplatin  To help prevent nausea and vomiting after your treatment, we encourage you to take your nausea medication as prescribed by your physician.   If you develop nausea and vomiting that is not controlled by your nausea medication, call the clinic.   BELOW ARE SYMPTOMS THAT SHOULD BE REPORTED IMMEDIATELY:  *FEVER GREATER THAN 100.5 F  *CHILLS WITH OR WITHOUT FEVER  NAUSEA AND VOMITING THAT IS NOT CONTROLLED WITH YOUR NAUSEA MEDICATION  *UNUSUAL SHORTNESS OF BREATH  *UNUSUAL BRUISING OR BLEEDING  TENDERNESS IN MOUTH AND THROAT WITH OR WITHOUT PRESENCE OF ULCERS  *URINARY PROBLEMS  *BOWEL PROBLEMS  UNUSUAL RASH Items with * indicate a potential emergency and should be followed up as soon as possible.  Feel free to call the clinic you have any questions or concerns. The clinic phone number is [phone].

## 2013-02-24 NOTE — Patient Instructions (Signed)
Your scan showed improvement in your disease.  Continue chemotherapy with carboplatin and Alimta as scheduled. Followup visit in 3 weeks with the next cycle of chemotherapy

## 2013-03-03 ENCOUNTER — Other Ambulatory Visit: Payer: Medicare Other

## 2013-03-10 ENCOUNTER — Other Ambulatory Visit: Payer: Medicare Other

## 2013-03-10 ENCOUNTER — Telehealth: Payer: Self-pay | Admitting: Internal Medicine

## 2013-03-10 NOTE — Telephone Encounter (Signed)
pt call to r/s lab to tomorrow due to weather....done

## 2013-03-11 ENCOUNTER — Other Ambulatory Visit (HOSPITAL_BASED_OUTPATIENT_CLINIC_OR_DEPARTMENT_OTHER): Payer: Medicare Other

## 2013-03-11 DIAGNOSIS — C7931 Secondary malignant neoplasm of brain: Secondary | ICD-10-CM

## 2013-03-11 DIAGNOSIS — C3430 Malignant neoplasm of lower lobe, unspecified bronchus or lung: Secondary | ICD-10-CM

## 2013-03-11 DIAGNOSIS — C7949 Secondary malignant neoplasm of other parts of nervous system: Secondary | ICD-10-CM

## 2013-03-11 LAB — COMPREHENSIVE METABOLIC PANEL (CC13)
ALT: 24 U/L (ref 0–55)
ANION GAP: 9 meq/L (ref 3–11)
AST: 25 U/L (ref 5–34)
Albumin: 3.4 g/dL — ABNORMAL LOW (ref 3.5–5.0)
Alkaline Phosphatase: 137 U/L (ref 40–150)
BUN: 9.3 mg/dL (ref 7.0–26.0)
CO2: 27 meq/L (ref 22–29)
Calcium: 8.7 mg/dL (ref 8.4–10.4)
Chloride: 107 mEq/L (ref 98–109)
Creatinine: 0.7 mg/dL (ref 0.7–1.3)
Glucose: 100 mg/dl (ref 70–140)
Potassium: 3.4 mEq/L — ABNORMAL LOW (ref 3.5–5.1)
SODIUM: 143 meq/L (ref 136–145)
TOTAL PROTEIN: 6.3 g/dL — AB (ref 6.4–8.3)
Total Bilirubin: 0.61 mg/dL (ref 0.20–1.20)

## 2013-03-11 LAB — CBC WITH DIFFERENTIAL/PLATELET
BASO%: 0.2 % (ref 0.0–2.0)
BASOS ABS: 0 10*3/uL (ref 0.0–0.1)
EOS%: 1.6 % (ref 0.0–7.0)
Eosinophils Absolute: 0 10*3/uL (ref 0.0–0.5)
HEMATOCRIT: 25.5 % — AB (ref 38.4–49.9)
HGB: 8.7 g/dL — ABNORMAL LOW (ref 13.0–17.1)
LYMPH%: 19.4 % (ref 14.0–49.0)
MCH: 33 pg (ref 27.2–33.4)
MCHC: 34.2 g/dL (ref 32.0–36.0)
MCV: 96.6 fL (ref 79.3–98.0)
MONO#: 0.5 10*3/uL (ref 0.1–0.9)
MONO%: 19.8 % — ABNORMAL HIGH (ref 0.0–14.0)
NEUT%: 59 % (ref 39.0–75.0)
NEUTROS ABS: 1.5 10*3/uL (ref 1.5–6.5)
PLATELETS: 87 10*3/uL — AB (ref 140–400)
RBC: 2.64 10*6/uL — ABNORMAL LOW (ref 4.20–5.82)
RDW: 17.1 % — ABNORMAL HIGH (ref 11.0–14.6)
WBC: 2.6 10*3/uL — AB (ref 4.0–10.3)
lymph#: 0.5 10*3/uL — ABNORMAL LOW (ref 0.9–3.3)

## 2013-03-13 ENCOUNTER — Ambulatory Visit
Admission: RE | Admit: 2013-03-13 | Discharge: 2013-03-13 | Disposition: A | Discharge status: Home / Self Care | Payer: Medicare Other | Source: Ambulatory Visit | Attending: Radiation Oncology | Admitting: Radiation Oncology

## 2013-03-13 DIAGNOSIS — C7931 Secondary malignant neoplasm of brain: Secondary | ICD-10-CM

## 2013-03-13 DIAGNOSIS — C7949 Secondary malignant neoplasm of other parts of nervous system: Principal | ICD-10-CM

## 2013-03-13 MED ORDER — GADOBENATE DIMEGLUMINE 529 MG/ML IV SOLN
16.0000 mL | Freq: Once | INTRAVENOUS | Status: AC | PRN
  Administered 2013-03-13: 16 mL via INTRAVENOUS

## 2013-03-16 ENCOUNTER — Ambulatory Visit
Admission: RE | Admit: 2013-03-16 | Discharge: 2013-03-16 | Disposition: A | Discharge status: Home / Self Care | Payer: Medicare Other | Source: Ambulatory Visit | Attending: Radiation Oncology | Admitting: Radiation Oncology

## 2013-03-16 ENCOUNTER — Other Ambulatory Visit: Payer: Self-pay | Admitting: Radiation Therapy

## 2013-03-16 ENCOUNTER — Ambulatory Visit
Admission: RE | Admit: 2013-03-16 | Discharge: 2013-03-16 | Disposition: A | Discharge status: Home / Self Care | Payer: Medicare Other | Source: Ambulatory Visit | Attending: Neurosurgery | Admitting: Neurosurgery

## 2013-03-16 ENCOUNTER — Encounter: Payer: Self-pay | Admitting: Radiation Oncology

## 2013-03-16 VITALS — BP 152/75 | HR 87 | Temp 97.8°F | Resp 20 | Ht 71.0 in | Wt 171.8 lb

## 2013-03-16 DIAGNOSIS — C7949 Secondary malignant neoplasm of other parts of nervous system: Secondary | ICD-10-CM

## 2013-03-16 DIAGNOSIS — C3430 Malignant neoplasm of lower lobe, unspecified bronchus or lung: Secondary | ICD-10-CM

## 2013-03-16 DIAGNOSIS — C7931 Secondary malignant neoplasm of brain: Secondary | ICD-10-CM

## 2013-03-16 DIAGNOSIS — R531 Weakness: Secondary | ICD-10-CM

## 2013-03-16 DIAGNOSIS — Z515 Encounter for palliative care: Secondary | ICD-10-CM

## 2013-03-16 NOTE — Progress Notes (Signed)
Follow up  Here for result MRI brain done 03/13/13,  No c/o eating well, slight head ache all over top of head says he hears his pulse in right ear stated, Chemotherapy tomorrow ,took decaron per protocol Some fatigue, no nausea 8:21 AM

## 2013-03-16 NOTE — Consult Note (Signed)
Patient DK:[name]      DOB: July 19, 1933      CAR:[id number]     Consult Note from the Palliative Medicine Team at [facility]    Consult Requested by: Dr Mitzi Hansen     PCP: Rudi Heap, MD Reason for Consultation:  Introduction to concept of Palliative Medicine and initiation of preparedness plan   This NP Lorinda Creed reviewed medical records, received report from team, assessed the patient and then meet with him and his wife [name] # [phone] to discuss diagnosis, prognosis, GOC, advanced directives, coping skills.   A detailed discussion was had today regarding advanced directives.  Concepts specific to code status, artifical feeding and hydration, continued IV antibiotics and rehospitalization was had.  Values and goals of care important to patient and family were attempted to be elicited.  Concept of Palliative Care was discussed    Questions and concerns addressed.  MOST form was introduced.  Encouraged to call with questions or concerns.  PMT will continue to support holistically.                                                                                 Assessment of patients Current state:  DIAGNOSIS: Stage IV ( T2b, N2, M1b) non-small cell lung cancer, adenocarcinoma with areas of squamous differentiation with negative EGFR mutation and negative ALK gene translocation, presented with left lower lobe lung mass in addition to mediastinal lymphadenopathy and brain metastases diagnosed in October of 2014  PRIOR THERAPY:  1) Stereotactic radiotherapy to 3 brain lesions under the care of Dr. Mitzi Hansen completed 12/08/2012.  2) Palliative radiotherapy to the left lower lobe lung mass under the care of Dr. Mitzi Hansen expected to be completed 01/01/2013.  CURRENT THERAPY: Systemic chemotherapy with carboplatin for AUC of 5 and Alimta 500 mg/M2 every 3 weeks. First dose 12/23/2012. Status post 4 cycles    [name] and his wife come to Rad-Onc clinic today for follow up and  result of MRI of the brain done on 03-13-13.  They are relieved and pleased with the results given today.  Both are pragmatic people and  open, receptive and express appreciation for todays visit with Palliative Medicine.    Goals of Care: 1.  Code Status:  DNR/DNI-he tells me this is documented in his Advanced Directives   2. Scope of Treatment:  Hopeful for all offered and available medical interventions to treat and prolong life within the parameters that he considers comfort quality and dignity.    3. Symptom Management:   1.  Weakness:  Gradual increase in activity within his comfort.                            We discussed  the value of  Healthy nutrition and will add MVI   4. Psychosocial:  Emotional support offered to both the patietn and his wife.  They understand the seriousness of his diagnosis and are hopeful for quality extention of life.  We discussed in detail the living in the now and knowing today is all any of [place] have and to try to enjoy today.  Positive affirmation  introduced    Patient Documents Completed or Given: Document Given Completed  Advanced Directives Pkt    MOST yes   DNR    Gone from My Sight    Hard Choices      Brief HPI: Stage IV ( T2b, N2, M1b) non-small cell lung cancer, adenocarcinoma with areas of squamous differentiation with negative EGFR mutation and negative ALK gene translocation, presented with left lower lobe lung mass in addition to mediastinal lymphadenopathy and brain metastases diagnosed in October of 2014  1) Stereotactic radiotherapy to 3 brain lesions under the care of Dr. Lisbeth Renshaw completed 12/08/2012.  2) Palliative radiotherapy to the left lower lobe lung mass under the care of Dr. Lisbeth Renshaw expected to be completed 01/01/2013.   CURRENT THERAPY: Systemic chemotherapy with carboplatin for AUC of 5 and Alimta 500 mg/M2 every 3 weeks. First dose 12/23/2012. Status post 3 cycles     ROS: mild fatigue, slight HA    PMH:  Past  Medical History  Diagnosis Date  . COPD (chronic obstructive pulmonary disease)   . Allergy     allergic rhinitis  . Hx of radiation therapy 12/09/12-01/01/13    lung 37.5Gy  . nscl ca w/ brain mets dx'd 08/2012    Patient has a lung mass which is being evaluated     PSH: Past Surgical History  Procedure Laterality Date  . Cholecystectomy     I have reviewed the FH and SH and  If appropriate update it with new information. Allergies  Allergen Reactions  . Sulfa Antibiotics Nausea And Vomiting   Scheduled Meds: Continuous Infusions: PRN Meds:.      BP 152/75  Pulse 87  Temp(Src) 97.8 F (36.6 C) (Oral)  Resp 20  Ht $R'5\' 11"'Rw$  (1.803 m)  Wt 171 lb 12.8 oz (77.928 kg)  BMI 23.97 kg/m2  SpO2 98%   PPS: 70 %  No intake or output data in the 24 hours ending 03/16/13 1048  Physical Exam:  General: chronically ill appearing, NAD HEENT:  Mm, no exudate Neuro: alert and oriented   Labs: CBC    Component Value Date/Time   WBC 2.6* 03/11/2013 1401   WBC 5.8 11/24/2012 0752   WBC 7.6 10/13/2012 1206   RBC 2.64* 03/11/2013 1401   RBC 4.29 11/24/2012 0752   RBC 4.6* 10/13/2012 1206   HGB 8.7* 03/11/2013 1401   HGB 13.5 11/24/2012 0752   HGB 13.7* 10/13/2012 1206   HCT 25.5* 03/11/2013 1401   HCT 38.0* 11/24/2012 0752   HCT 41.5* 10/13/2012 1206   PLT 87* 03/11/2013 1401   PLT 173 11/24/2012 0752   MCV 96.6 03/11/2013 1401   MCV 88.6 11/24/2012 0752   MCV 89.4 10/13/2012 1206   MCH 33.0 03/11/2013 1401   MCH 31.5 11/24/2012 0752   MCH 29.4 10/13/2012 1206   MCHC 34.2 03/11/2013 1401   MCHC 35.5 11/24/2012 0752   MCHC 32.9 10/13/2012 1206   RDW 17.1* 03/11/2013 1401   RDW 12.6 11/24/2012 0752   LYMPHSABS 0.5* 03/11/2013 1401   MONOABS 0.5 03/11/2013 1401   EOSABS 0.0 03/11/2013 1401   BASOSABS 0.0 03/11/2013 1401    BMET    Component Value Date/Time   NA 143 03/11/2013 1401   NA CANCELED 10/13/2012 1149   NA 138 06/13/2007 0420   K 3.4* 03/11/2013 1401   K CANCELED  10/13/2012 1149   CL CANCELED 10/13/2012 1149   CO2 27 03/11/2013 1401   CO2 CANCELED 10/13/2012 1149   GLUCOSE  100 03/11/2013 1401   GLUCOSE CANCELED 10/13/2012 1149   GLUCOSE 119* 06/13/2007 0420   BUN 9.3 03/11/2013 1401   BUN CANCELED 10/13/2012 1149   BUN 10 06/13/2007 0420   CREATININE 0.7 03/11/2013 1401   CREATININE 1.00 11/10/2012 1431   CALCIUM 8.7 03/11/2013 1401   CALCIUM CANCELED 10/13/2012 1149   GFRNONAA 82 09/08/2012 1522   GFRAA 94 09/08/2012 1522    CMP     Component Value Date/Time   NA 143 03/11/2013 1401   NA CANCELED 10/13/2012 1149   NA 138 06/13/2007 0420   K 3.4* 03/11/2013 1401   K CANCELED 10/13/2012 1149   CL CANCELED 10/13/2012 1149   CO2 27 03/11/2013 1401   CO2 CANCELED 10/13/2012 1149   GLUCOSE 100 03/11/2013 1401   GLUCOSE CANCELED 10/13/2012 1149   GLUCOSE 119* 06/13/2007 0420   BUN 9.3 03/11/2013 1401   BUN CANCELED 10/13/2012 1149   BUN 10 06/13/2007 0420   CREATININE 0.7 03/11/2013 1401   CREATININE 1.00 11/10/2012 1431   CALCIUM 8.7 03/11/2013 1401   CALCIUM CANCELED 10/13/2012 1149   PROT 6.3* 03/11/2013 1401   PROT CANCELED 10/13/2012 1149   PROT 7.3 07/02/2007 1140   ALBUMIN 3.4* 03/11/2013 1401   ALBUMIN 3.6 07/02/2007 1140   AST 25 03/11/2013 1401   AST CANCELED 10/13/2012 1149   ALT 24 03/11/2013 1401   ALT CANCELED 10/13/2012 1149   ALKPHOS 137 03/11/2013 1401   ALKPHOS CANCELED 10/13/2012 1149   BILITOT 0.61 03/11/2013 1401   BILITOT CANCELED 10/13/2012 1149   GFRNONAA 82 09/08/2012 1522   GFRAA 94 09/08/2012 1522     CT scan of the abdomen/pelvis   IMPRESSION:  1. Interval decrease in size of a cavitary right lower lobe mass  with improving postobstructive pneumonitis in the right lower lobe.  2. No evidence of metastatic disease in the abdomen, pelvis or  bones.  3. Marginal irregularity of the liver is indicative of cirrhosis.  Please correlate clinically.  4. Tiny right renal stone.  5. Question mild changes of chronic calcific pancreatitis.  6.  Enlarged prostate.   MRI Brain 03-13-13  FINDINGS:  Diffusion imaging does not show any acute or subacute infarction.  There chronic small vessel changes of the white matter. No  hydrocephalus or extra-axial collection.  There are no new or enlarging metastatic lesions. Three lesions  previously seen are all smaller. Right posterior cerebellar lesion  measures 10 x 8 mm. Previously this measured 11 x 13 mm. Medial left  parieto-occipital lesion measures 4 x 6 mm. Previously this measured  5 x 7 mm. Right parietal cortical lesion measures 5 mm in diameter,  previously measuring 7 x 8 mm No skull or skullbase lesion is seen.  No leptomeningeal enhancement.  IMPRESSION:  Three metastatic lesions being followed are all a few mm smaller. No  progressive lesions. No new lesions.    Time In Time Out Total Time Spent with Patient Total Overall Time  0900 1020 75 min 80 min    Greater than 50%  of this time was spent counseling and coordinating care related to the above assessment and plan.  Wadie Lessen NP  Palliative Medicine Team Team Phone # [phone] Pager [phone]

## 2013-03-17 ENCOUNTER — Ambulatory Visit (HOSPITAL_BASED_OUTPATIENT_CLINIC_OR_DEPARTMENT_OTHER): Payer: Medicare Other

## 2013-03-17 ENCOUNTER — Other Ambulatory Visit (HOSPITAL_BASED_OUTPATIENT_CLINIC_OR_DEPARTMENT_OTHER): Payer: Medicare Other

## 2013-03-17 ENCOUNTER — Ambulatory Visit (HOSPITAL_BASED_OUTPATIENT_CLINIC_OR_DEPARTMENT_OTHER): Payer: Medicare Other | Admitting: Internal Medicine

## 2013-03-17 ENCOUNTER — Encounter: Payer: Self-pay | Admitting: Internal Medicine

## 2013-03-17 ENCOUNTER — Telehealth: Payer: Self-pay | Admitting: Internal Medicine

## 2013-03-17 VITALS — BP 145/83 | HR 86 | Temp 97.6°F | Resp 18 | Ht 71.0 in | Wt 170.2 lb

## 2013-03-17 DIAGNOSIS — C3490 Malignant neoplasm of unspecified part of unspecified bronchus or lung: Secondary | ICD-10-CM

## 2013-03-17 DIAGNOSIS — C3430 Malignant neoplasm of lower lobe, unspecified bronchus or lung: Secondary | ICD-10-CM

## 2013-03-17 DIAGNOSIS — C7949 Secondary malignant neoplasm of other parts of nervous system: Secondary | ICD-10-CM

## 2013-03-17 DIAGNOSIS — C7931 Secondary malignant neoplasm of brain: Secondary | ICD-10-CM

## 2013-03-17 DIAGNOSIS — Z5111 Encounter for antineoplastic chemotherapy: Secondary | ICD-10-CM

## 2013-03-17 LAB — CBC WITH DIFFERENTIAL/PLATELET
BASO%: 0.2 % (ref 0.0–2.0)
Basophils Absolute: 0 10*3/uL (ref 0.0–0.1)
EOS ABS: 0 10*3/uL (ref 0.0–0.5)
EOS%: 0 % (ref 0.0–7.0)
HCT: 27.7 % — ABNORMAL LOW (ref 38.4–49.9)
HGB: 9.6 g/dL — ABNORMAL LOW (ref 13.0–17.1)
LYMPH%: 9.3 % — ABNORMAL LOW (ref 14.0–49.0)
MCH: 32.7 pg (ref 27.2–33.4)
MCHC: 34.7 g/dL (ref 32.0–36.0)
MCV: 94.2 fL (ref 79.3–98.0)
MONO#: 0.7 10*3/uL (ref 0.1–0.9)
MONO%: 13.1 % (ref 0.0–14.0)
NEUT#: 3.9 10*3/uL (ref 1.5–6.5)
NEUT%: 77.4 % — ABNORMAL HIGH (ref 39.0–75.0)
Platelets: 185 10*3/uL (ref 140–400)
RBC: 2.94 10*6/uL — AB (ref 4.20–5.82)
RDW: 16.6 % — ABNORMAL HIGH (ref 11.0–14.6)
WBC: 5.1 10*3/uL (ref 4.0–10.3)
lymph#: 0.5 10*3/uL — ABNORMAL LOW (ref 0.9–3.3)

## 2013-03-17 LAB — COMPREHENSIVE METABOLIC PANEL (CC13)
ALBUMIN: 3.8 g/dL (ref 3.5–5.0)
ALT: 20 U/L (ref 0–55)
AST: 27 U/L (ref 5–34)
Alkaline Phosphatase: 131 U/L (ref 40–150)
Anion Gap: 10 mEq/L (ref 3–11)
BUN: 14.6 mg/dL (ref 7.0–26.0)
CO2: 26 mEq/L (ref 22–29)
Calcium: 9.7 mg/dL (ref 8.4–10.4)
Chloride: 104 mEq/L (ref 98–109)
Creatinine: 0.8 mg/dL (ref 0.7–1.3)
GLUCOSE: 125 mg/dL (ref 70–140)
POTASSIUM: 3.7 meq/L (ref 3.5–5.1)
SODIUM: 141 meq/L (ref 136–145)
TOTAL PROTEIN: 7.2 g/dL (ref 6.4–8.3)
Total Bilirubin: 0.61 mg/dL (ref 0.20–1.20)

## 2013-03-17 MED ORDER — SODIUM CHLORIDE 0.9 % IV SOLN
Freq: Once | INTRAVENOUS | Status: AC
  Administered 2013-03-17: 11:00:00 via INTRAVENOUS

## 2013-03-17 MED ORDER — ONDANSETRON 16 MG/50ML IVPB (CHCC)
16.0000 mg | Freq: Once | INTRAVENOUS | Status: AC
  Administered 2013-03-17: 16 mg via INTRAVENOUS

## 2013-03-17 MED ORDER — DEXAMETHASONE SODIUM PHOSPHATE 20 MG/5ML IJ SOLN
20.0000 mg | Freq: Once | INTRAMUSCULAR | Status: AC
  Administered 2013-03-17: 20 mg via INTRAVENOUS

## 2013-03-17 MED ORDER — SODIUM CHLORIDE 0.9 % IV SOLN
500.0000 mg/m2 | Freq: Once | INTRAVENOUS | Status: AC
  Administered 2013-03-17: 950 mg via INTRAVENOUS
  Filled 2013-03-17: qty 38

## 2013-03-17 MED ORDER — SODIUM CHLORIDE 0.9 % IV SOLN
453.5000 mg | Freq: Once | INTRAVENOUS | Status: AC
  Administered 2013-03-17: 450 mg via INTRAVENOUS
  Filled 2013-03-17: qty 45

## 2013-03-17 MED ORDER — ONDANSETRON 16 MG/50ML IVPB (CHCC)
INTRAVENOUS | Status: AC
  Filled 2013-03-17: qty 16

## 2013-03-17 MED ORDER — DEXAMETHASONE SODIUM PHOSPHATE 20 MG/5ML IJ SOLN
INTRAMUSCULAR | Status: AC
  Filled 2013-03-17: qty 5

## 2013-03-17 NOTE — Telephone Encounter (Signed)
gv adn printed aptp sched adn avs for pt for March

## 2013-03-17 NOTE — Progress Notes (Signed)
[place] [phone]   [phone]  OFFICE PROGRESS NOTE  [name], MD [place] [address]  DIAGNOSIS: Stage IV ( T2b, N2, M1b) non-small cell lung cancer, adenocarcinoma with areas of squamous differentiation with negative EGFR mutation and negative ALK gene translocation, presented with left lower lobe lung mass in addition to mediastinal lymphadenopathy and brain metastases diagnosed in October of 2014  PRIOR THERAPY: 1) Stereotactic radiotherapy to 3 brain lesions under the care of Dr. Lisbeth Renshaw completed 12/08/2012. 2) Palliative radiotherapy to the left lower lobe lung mass under the care of Dr. Lisbeth Renshaw expected to be completed 01/01/2013.  CURRENT THERAPY: Systemic chemotherapy with carboplatin for AUC of 5 and Alimta 500 mg/M2 every 3 weeks. First dose 12/23/2012. Status post 4 cycles   CHEMOTHERAPY INTENT: Palliative  CURRENT # OF CHEMOTHERAPY CYCLES: 5  CURRENT ANTIEMETICS:Zofran, dexamethasone and Compazine  CURRENT SMOKING STATUS: former smoker  ORAL CHEMOTHERAPY AND CONSENT: None  CURRENT BISPHOSPHONATES USE: None  PAIN MANAGEMENT: 0/10  NARCOTICS INDUCED CONSTIPATION: None  LIVING WILL AND CODE STATUS: ?  INTERVAL HISTORY: [name] 80 y.o. male returns to the clinic today for follow up visit accompanied by his wife. The patient is feeling fine today with no specific complaints except for mild fatigue. He is tolerating his systemic chemotherapy with carboplatin and Alimta fairly well. The patient denied having any nausea or vomiting. He denied having any fever or chills. He has no chest pain, shortness breath, cough or hemoptysis. He has no weight loss or night sweats. He is here today to start cycle #5 of his chemotherapy.  MEDICAL HISTORY: Past Medical History  Diagnosis Date  . COPD (chronic obstructive pulmonary disease)   . Allergy     allergic rhinitis  . Hx of radiation therapy  12/09/12-01/01/13    lung 37.5Gy  . nscl ca w/ brain mets dx'd 08/2012    Patient has a lung mass which is being evaluated    ALLERGIES:  is allergic to sulfa antibiotics.  MEDICATIONS:  Current Outpatient Prescriptions  Medication Sig Dispense Refill  . acetaminophen (TYLENOL) 500 MG tablet Take 500 mg by mouth every 6 (six) hours as needed.      . clobetasol cream (TEMOVATE) 0.63 % Apply 1 application topically daily as needed (for itching).      Marland Kitchen dexamethasone (DECADRON) 4 MG tablet 4 mg by mouth twice a day the day before, day of and day after the chemotherapy every 3 weeks  40 tablet  1  . folic acid (FOLVITE) 016 MCG tablet Take 0.5 tablets (400 mcg total) by mouth daily.  30 tablet  3  . Polyethyl Glycol-Propyl Glycol (SYSTANE OP) Place 1 drop into both eyes daily.      . simvastatin (ZOCOR) 10 MG tablet Take 1 tablet (10 mg total) by mouth at bedtime.  90 tablet  3  . prochlorperazine (COMPAZINE) 10 MG tablet Take 1 tablet (10 mg total) by mouth every 6 (six) hours as needed for nausea or vomiting.  60 tablet  0   No current facility-administered medications for this visit.    SURGICAL HISTORY:  Past Surgical History  Procedure Laterality Date  . Cholecystectomy      REVIEW OF SYSTEMS:  Constitutional: negative Eyes: negative Ears, nose, mouth, throat, and face: negative Respiratory: negative Cardiovascular: negative Gastrointestinal: negative Genitourinary:negative Integument/breast: negative Hematologic/lymphatic: negative Musculoskeletal:negative Neurological: negative Behavioral/Psych: negative Endocrine: negative Allergic/Immunologic: negative   PHYSICAL EXAMINATION: General  appearance: alert, cooperative and no distress Head: Normocephalic, without obvious abnormality, atraumatic Neck: no adenopathy, no JVD, supple, symmetrical, trachea midline and thyroid not enlarged, symmetric, no tenderness/mass/nodules Lymph nodes: Cervical, supraclavicular, and  axillary nodes normal. Resp: clear to auscultation bilaterally Back: symmetric, no curvature. ROM normal. No CVA tenderness. Cardio: regular rate and rhythm, S1, S2 normal, no murmur, click, rub or gallop GI: soft, non-tender; bowel sounds normal; no masses,  no organomegaly Extremities: extremities normal, atraumatic, no cyanosis or edema Neurologic: Alert and oriented X 3, normal strength and tone. Normal symmetric reflexes. Normal coordination and gait  ECOG PERFORMANCE STATUS: 1 - Symptomatic but completely ambulatory  Blood pressure 145/83, pulse 86, temperature 97.6 F (36.4 C), temperature source Oral, resp. rate 18, height $RemoveBe'5\' 11"'[id number]$  (1.803 m), weight 170 lb 3.2 oz (77.202 kg), SpO2 99.00%.  LABORATORY DATA: Lab Results  Component Value Date   WBC 5.1 03/17/2013   HGB 9.6* 03/17/2013   HCT 27.7* 03/17/2013   MCV 94.2 03/17/2013   PLT 185 03/17/2013      Chemistry      Component Value Date/Time   NA 143 03/11/2013 1401   NA CANCELED 10/13/2012 1149   NA 138 06/13/2007 0420   K 3.4* 03/11/2013 1401   K CANCELED 10/13/2012 1149   CL CANCELED 10/13/2012 1149   CO2 27 03/11/2013 1401   CO2 CANCELED 10/13/2012 1149   BUN 9.3 03/11/2013 1401   BUN CANCELED 10/13/2012 1149   BUN 10 06/13/2007 0420   CREATININE 0.7 03/11/2013 1401   CREATININE 1.00 11/10/2012 1431      Component Value Date/Time   CALCIUM 8.7 03/11/2013 1401   CALCIUM CANCELED 10/13/2012 1149   ALKPHOS 137 03/11/2013 1401   ALKPHOS CANCELED 10/13/2012 1149   AST 25 03/11/2013 1401   AST CANCELED 10/13/2012 1149   ALT 24 03/11/2013 1401   ALT CANCELED 10/13/2012 1149   BILITOT 0.61 03/11/2013 1401   BILITOT CANCELED 10/13/2012 1149       RADIOGRAPHIC STUDIES:   ASSESSMENT AND PLAN: this is a very pleasant 80 years old white male with metastatic non-small cell lung cancer, adenocarcinoma with brain metastasis status post stereotactic radiotherapy to the brain lesions followed by palliative radiotherapy to the left lower lobe lung  mass. The patient is currently undergoing systemic chemotherapy with carboplatin and Alimta status post 4 cycles. He is tolerating his treatment fairly well. I recommended for him to continue systemic chemotherapy with the same treatment regimen.  He would come back for follow up visit in 3 weeks for evaluation before starting cycle #6.  The patient voices understanding of current disease status and treatment options and is in agreement with the current care plan.  All questions were answered. The patient knows to call the clinic with any problems, questions or concerns. We can certainly see the patient much sooner if necessary.  Disclaimer: This note was dictated with voice recognition software. Similar sounding words can inadvertently be transcribed and may not be corrected upon review.

## 2013-03-17 NOTE — Progress Notes (Signed)
Radiation Oncology         [phone] ________________________________  Name: [name] MRN: [id number]  Date: 03/16/2013  DOB: Feb 19, 1933  Follow-Up Visit Note  CC: Preston Gainer, MD  Preston Herb, MD  Diagnosis:   Metastatic lung cancer with brain metastasis  Interval Since Last Radiation:  6 weeks   Narrative:  The patient returns today for routine follow-up.  The patient returns to clinic today for followup. He underwent a recent MRI scan of the brain which I have reviewed. This was discussed in the brain conference this morning. This looks very good with no signs of new or progressive disease. The patient states that he is doing relatively well at this time. He complains of occasional slight headaches but no severe/ongoing headaches. He continues chemotherapy. Some fatigue. No nausea.                              ALLERGIES:  is allergic to sulfa antibiotics.  Meds: Current Outpatient Prescriptions  Medication Sig Dispense Refill  . acetaminophen (TYLENOL) 500 MG tablet Take 500 mg by mouth every 6 (six) hours as needed.      . clobetasol cream (TEMOVATE) 3.79 % Apply 1 application topically daily as needed (for itching).      Marland Kitchen dexamethasone (DECADRON) 4 MG tablet 4 mg by mouth twice a day the day before, day of and day after the chemotherapy every 3 weeks  40 tablet  1  . folic acid (FOLVITE) 024 MCG tablet Take 0.5 tablets (400 mcg total) by mouth daily.  30 tablet  3  . Polyethyl Glycol-Propyl Glycol (SYSTANE OP) Place 1 drop into both eyes daily.      . prochlorperazine (COMPAZINE) 10 MG tablet Take 1 tablet (10 mg total) by mouth every 6 (six) hours as needed for nausea or vomiting.  60 tablet  0  . simvastatin (ZOCOR) 10 MG tablet Take 1 tablet (10 mg total) by mouth at bedtime.  90 tablet  3   No current facility-administered medications for this encounter.    Physical Findings: The patient is in no acute distress. Patient is alert and oriented.  height is 5'  11" (1.803 m) and weight is 171 lb 12.8 oz (77.928 kg). His oral temperature is 97.8 F (36.6 C). His blood pressure is 152/75 and his pulse is 87. His respiration is 20 and oxygen saturation is 98%. .   General: Well-developed, in no acute distress HEENT: Normocephalic, atraumatic Cardiovascular: Regular rate and rhythm Respiratory: Clear to auscultation bilaterally GI: Soft, nontender, normal bowel sounds Extremities: No edema present   Lab Findings: Lab Results  Component Value Date   WBC 5.1 03/17/2013   HGB 9.6* 03/17/2013   HCT 27.7* 03/17/2013   MCV 94.2 03/17/2013   PLT 185 03/17/2013     Radiographic Findings: Ct Chest W Contrast  02/20/2013   CLINICAL DATA:  Restaging metastatic non-small cell lung cancer. Ongoing chemotherapy. Weight loss and shortness of breath.  EXAM: CT CHEST, ABDOMEN, AND PELVIS WITH CONTRAST  TECHNIQUE: Multidetector CT imaging of the chest, abdomen and pelvis was performed following the standard protocol during bolus administration of intravenous contrast.  CONTRAST:  152mL OMNIPAQUE IOHEXOL 300 MG/ML  SOLN  COMPARISON:  NM PET IMAGE INITIAL (PI) SKULL BASE TO THIGH dated 11/04/2012; CT CHEST W/CM dated 10/14/2012; CT ABDOMEN WO/W CM dated 06/10/2007  FINDINGS: CT CHEST FINDINGS  No pathologically enlarged mediastinal, hilar or  axillary lymph nodes. Atherosclerotic calcification of the arterial vasculature, including coronary arteries. Heart size within normal limits. No pericardial effusion.  Biapical pleural parenchymal scarring. Centrilobular emphysema. Subpleural opacity in the posterior aspect of the right upper lobe is unchanged. Right lower lobe mass measures 2.9 x 4.6 cm (previously 3.8 x 6.0 cm). There is a cavitary component to the mass, with a small air-fluid level (image 38). Mild surrounding inflammatory ground-glass, improved somewhat from 10/14/2012. Calcified granuloma in the left lower lobe. Probable subpleural lymph node along the left major fissure  (image 32), unchanged. No pleural fluid. There is narrowing of the distal left lower lobe bronchus. Airway is otherwise unremarkable.  CT ABDOMEN AND PELVIS FINDINGS  Left hepatic lobe margin is irregular. Cholecystectomy. Adrenal glands are unremarkable. Tiny stone in the lower pole right kidney. Kidneys and spleen are unremarkable. There are scattered tiny calcifications associated with the periphery of the pancreas. Pancreas, stomach and small bowel are unremarkable with the exception of incidentally noted duodenal diverticula. Colon is unremarkable.  Prostate is enlarged. Atherosclerotic calcification of the arterial vasculature without abdominal aortic aneurysm. No pathologically enlarged lymph nodes. No free fluid. No worrisome lytic or sclerotic lesions. Degenerative changes are seen in the spine. Advanced degenerative disc disease at L5-S1 with minimal grade 1 anterolisthesis.  IMPRESSION: 1. Interval decrease in size of a cavitary right lower lobe mass with improving postobstructive pneumonitis in the right lower lobe. 2. No evidence of metastatic disease in the abdomen, pelvis or bones. 3. Marginal irregularity of the liver is indicative of cirrhosis. Please correlate clinically. 4. Tiny right renal stone. 5. Question mild changes of chronic calcific pancreatitis. 6. Enlarged prostate.   Electronically Signed   By: [name] M.D.   On: 02/20/2013 15:12   Mr [name] [facility] Contrast  03/13/2013   CLINICAL DATA:  Metastatic lung cancer.  Restaging.  EXAM: MRI HEAD WITHOUT AND WITH CONTRAST  TECHNIQUE: Multiplanar, multiecho pulse sequences of the brain and surrounding structures were obtained without and with intravenous contrast.  CONTRAST:  40mL MULTIHANCE GADOBENATE DIMEGLUMINE 529 MG/ML IV SOLN  COMPARISON:  12/02/2012  FINDINGS: Diffusion imaging does not show any acute or subacute infarction. There chronic small vessel changes of the white matter. No hydrocephalus or extra-axial collection.  There  are no new or enlarging metastatic lesions. Three lesions previously seen are all smaller. Right posterior cerebellar lesion measures 10 x 8 mm. Previously this measured 11 x 13 mm. Medial left parieto-occipital lesion measures 4 x 6 mm. Previously this measured 5 x 7 mm. Right parietal cortical lesion measures 5 mm in diameter, previously measuring 7 x 8 mm No skull or skullbase lesion is seen. No leptomeningeal enhancement.  IMPRESSION: Three metastatic lesions being followed are all a few mm smaller. No progressive lesions. No new lesions.   Electronically Signed   By: Nelson Chimes M.D.   On: 03/13/2013 15:06   Ct Abdomen Pelvis W Contrast  02/20/2013   CLINICAL DATA:  Restaging metastatic non-small cell lung cancer. Ongoing chemotherapy. Weight loss and shortness of breath.  EXAM: CT CHEST, ABDOMEN, AND PELVIS WITH CONTRAST  TECHNIQUE: Multidetector CT imaging of the chest, abdomen and pelvis was performed following the standard protocol during bolus administration of intravenous contrast.  CONTRAST:  16mL OMNIPAQUE IOHEXOL 300 MG/ML  SOLN  COMPARISON:  NM PET IMAGE INITIAL (PI) SKULL BASE TO THIGH dated 11/04/2012; CT CHEST W/CM dated 10/14/2012; CT ABDOMEN WO/W CM dated 06/10/2007  FINDINGS: CT CHEST FINDINGS  No pathologically  enlarged mediastinal, hilar or axillary lymph nodes. Atherosclerotic calcification of the arterial vasculature, including coronary arteries. Heart size within normal limits. No pericardial effusion.  Biapical pleural parenchymal scarring. Centrilobular emphysema. Subpleural opacity in the posterior aspect of the right upper lobe is unchanged. Right lower lobe mass measures 2.9 x 4.6 cm (previously 3.8 x 6.0 cm). There is a cavitary component to the mass, with a small air-fluid level (image 38). Mild surrounding inflammatory ground-glass, improved somewhat from 10/14/2012. Calcified granuloma in the left lower lobe. Probable subpleural lymph node along the left major fissure (image 32),  unchanged. No pleural fluid. There is narrowing of the distal left lower lobe bronchus. Airway is otherwise unremarkable.  CT ABDOMEN AND PELVIS FINDINGS  Left hepatic lobe margin is irregular. Cholecystectomy. Adrenal glands are unremarkable. Tiny stone in the lower pole right kidney. Kidneys and spleen are unremarkable. There are scattered tiny calcifications associated with the periphery of the pancreas. Pancreas, stomach and small bowel are unremarkable with the exception of incidentally noted duodenal diverticula. Colon is unremarkable.  Prostate is enlarged. Atherosclerotic calcification of the arterial vasculature without abdominal aortic aneurysm. No pathologically enlarged lymph nodes. No free fluid. No worrisome lytic or sclerotic lesions. Degenerative changes are seen in the spine. Advanced degenerative disc disease at L5-S1 with minimal grade 1 anterolisthesis.  IMPRESSION: 1. Interval decrease in size of a cavitary right lower lobe mass with improving postobstructive pneumonitis in the right lower lobe. 2. No evidence of metastatic disease in the abdomen, pelvis or bones. 3. Marginal irregularity of the liver is indicative of cirrhosis. Please correlate clinically. 4. Tiny right renal stone. 5. Question mild changes of chronic calcific pancreatitis. 6. Enlarged prostate.   Electronically Signed   By: [name] M.D.   On: 02/20/2013 15:12    Impression:    The patient is doing well 3 months after his course of radiosurgery 3 intracranial lesions. His MRI scan looked good with no concerning findings at this time. The patient is continuing chemotherapy with favorable response systemically.  Plan:  The patient will undergo a repeat MRI scan of the brain in 3 months, then followup.   Jodelle Gross, M.D., Ph.D.

## 2013-03-20 ENCOUNTER — Telehealth: Payer: Self-pay | Admitting: *Deleted

## 2013-03-20 NOTE — Telephone Encounter (Signed)
Pt called to cancel his lab appt for 03/24/13 and to rs for 03/25/13. gv appt for labs 03/25/13@ 11:30am. Pt is aware...td

## 2013-03-24 ENCOUNTER — Other Ambulatory Visit: Payer: Medicare Other

## 2013-03-25 ENCOUNTER — Other Ambulatory Visit (HOSPITAL_BASED_OUTPATIENT_CLINIC_OR_DEPARTMENT_OTHER): Payer: Medicare Other

## 2013-03-25 ENCOUNTER — Other Ambulatory Visit: Payer: Self-pay | Admitting: *Deleted

## 2013-03-25 DIAGNOSIS — C7949 Secondary malignant neoplasm of other parts of nervous system: Secondary | ICD-10-CM

## 2013-03-25 DIAGNOSIS — C3430 Malignant neoplasm of lower lobe, unspecified bronchus or lung: Secondary | ICD-10-CM

## 2013-03-25 DIAGNOSIS — C3490 Malignant neoplasm of unspecified part of unspecified bronchus or lung: Secondary | ICD-10-CM

## 2013-03-25 DIAGNOSIS — C7931 Secondary malignant neoplasm of brain: Secondary | ICD-10-CM

## 2013-03-25 LAB — COMPREHENSIVE METABOLIC PANEL (CC13)
ALK PHOS: 138 U/L (ref 40–150)
ALT: 25 U/L (ref 0–55)
AST: 31 U/L (ref 5–34)
Albumin: 3.7 g/dL (ref 3.5–5.0)
Anion Gap: 8 mEq/L (ref 3–11)
BILIRUBIN TOTAL: 1.11 mg/dL (ref 0.20–1.20)
BUN: 16 mg/dL (ref 7.0–26.0)
CO2: 26 mEq/L (ref 22–29)
Calcium: 9.2 mg/dL (ref 8.4–10.4)
Chloride: 102 mEq/L (ref 98–109)
Creatinine: 0.7 mg/dL (ref 0.7–1.3)
GLUCOSE: 95 mg/dL (ref 70–140)
Potassium: 4.1 mEq/L (ref 3.5–5.1)
SODIUM: 137 meq/L (ref 136–145)
Total Protein: 6.8 g/dL (ref 6.4–8.3)

## 2013-03-25 LAB — CBC WITH DIFFERENTIAL/PLATELET
BASO%: 0 % (ref 0.0–2.0)
Basophils Absolute: 0 10*3/uL (ref 0.0–0.1)
EOS%: 2 % (ref 0.0–7.0)
Eosinophils Absolute: 0 10*3/uL (ref 0.0–0.5)
HCT: 26.8 % — ABNORMAL LOW (ref 38.4–49.9)
HGB: 9.3 g/dL — ABNORMAL LOW (ref 13.0–17.1)
LYMPH%: 30.8 % (ref 14.0–49.0)
MCH: 33.7 pg — ABNORMAL HIGH (ref 27.2–33.4)
MCHC: 34.8 g/dL (ref 32.0–36.0)
MCV: 96.7 fL (ref 79.3–98.0)
MONO#: 0.3 10*3/uL (ref 0.1–0.9)
MONO%: 14.4 % — AB (ref 0.0–14.0)
NEUT#: 1 10*3/uL — ABNORMAL LOW (ref 1.5–6.5)
NEUT%: 52.8 % (ref 39.0–75.0)
Platelets: 110 10*3/uL — ABNORMAL LOW (ref 140–400)
RBC: 2.77 10*6/uL — ABNORMAL LOW (ref 4.20–5.82)
RDW: 16.3 % — ABNORMAL HIGH (ref 11.0–14.6)
WBC: 1.9 10*3/uL — ABNORMAL LOW (ref 4.0–10.3)
lymph#: 0.6 10*3/uL — ABNORMAL LOW (ref 0.9–3.3)

## 2013-03-30 ENCOUNTER — Telehealth: Payer: Self-pay | Admitting: Internal Medicine

## 2013-03-30 NOTE — Telephone Encounter (Signed)
returned pt wife call and r/s lab to tomorrow due to death in family...done....aware of new d.t

## 2013-03-31 ENCOUNTER — Other Ambulatory Visit: Payer: Medicare Other

## 2013-03-31 DIAGNOSIS — R531 Weakness: Secondary | ICD-10-CM | POA: Insufficient documentation

## 2013-03-31 NOTE — Consult Note (Signed)
I have reviewed and discussed the care of this patient in detail with the nurse practitioner including pertinent patient records, physical exam findings and data. I agree with details of this encounter.  

## 2013-04-01 ENCOUNTER — Other Ambulatory Visit (HOSPITAL_BASED_OUTPATIENT_CLINIC_OR_DEPARTMENT_OTHER): Payer: Medicare Other

## 2013-04-01 DIAGNOSIS — C3490 Malignant neoplasm of unspecified part of unspecified bronchus or lung: Secondary | ICD-10-CM

## 2013-04-01 DIAGNOSIS — C3430 Malignant neoplasm of lower lobe, unspecified bronchus or lung: Secondary | ICD-10-CM

## 2013-04-01 LAB — COMPREHENSIVE METABOLIC PANEL (CC13)
ALT: 22 U/L (ref 0–55)
AST: 23 U/L (ref 5–34)
Albumin: 3.6 g/dL (ref 3.5–5.0)
Alkaline Phosphatase: 134 U/L (ref 40–150)
Anion Gap: 9 mEq/L (ref 3–11)
BILIRUBIN TOTAL: 0.59 mg/dL (ref 0.20–1.20)
BUN: 11.6 mg/dL (ref 7.0–26.0)
CO2: 26 meq/L (ref 22–29)
CREATININE: 0.8 mg/dL (ref 0.7–1.3)
Calcium: 9.2 mg/dL (ref 8.4–10.4)
Chloride: 105 mEq/L (ref 98–109)
Glucose: 99 mg/dl (ref 70–140)
Potassium: 3.8 mEq/L (ref 3.5–5.1)
Sodium: 140 mEq/L (ref 136–145)
Total Protein: 6.6 g/dL (ref 6.4–8.3)

## 2013-04-01 LAB — CBC WITH DIFFERENTIAL/PLATELET
BASO%: 0.5 % (ref 0.0–2.0)
BASOS ABS: 0 10*3/uL (ref 0.0–0.1)
EOS%: 1.8 % (ref 0.0–7.0)
Eosinophils Absolute: 0 10*3/uL (ref 0.0–0.5)
HEMATOCRIT: 25.5 % — AB (ref 38.4–49.9)
HGB: 8.7 g/dL — ABNORMAL LOW (ref 13.0–17.1)
LYMPH%: 17.1 % (ref 14.0–49.0)
MCH: 34 pg — ABNORMAL HIGH (ref 27.2–33.4)
MCHC: 34.2 g/dL (ref 32.0–36.0)
MCV: 99.4 fL — ABNORMAL HIGH (ref 79.3–98.0)
MONO#: 0.5 10*3/uL (ref 0.1–0.9)
MONO%: 23.9 % — ABNORMAL HIGH (ref 0.0–14.0)
NEUT#: 1.2 10*3/uL — ABNORMAL LOW (ref 1.5–6.5)
NEUT%: 56.7 % (ref 39.0–75.0)
PLATELETS: 83 10*3/uL — AB (ref 140–400)
RBC: 2.57 10*6/uL — ABNORMAL LOW (ref 4.20–5.82)
RDW: 16.4 % — ABNORMAL HIGH (ref 11.0–14.6)
WBC: 2.2 10*3/uL — AB (ref 4.0–10.3)
lymph#: 0.4 10*3/uL — ABNORMAL LOW (ref 0.9–3.3)

## 2013-04-06 ENCOUNTER — Other Ambulatory Visit: Payer: Self-pay | Admitting: Internal Medicine

## 2013-04-07 ENCOUNTER — Ambulatory Visit (HOSPITAL_BASED_OUTPATIENT_CLINIC_OR_DEPARTMENT_OTHER): Payer: Medicare Other | Admitting: Physician Assistant

## 2013-04-07 ENCOUNTER — Other Ambulatory Visit: Payer: Medicare Other

## 2013-04-07 ENCOUNTER — Encounter: Payer: Self-pay | Admitting: Physician Assistant

## 2013-04-07 ENCOUNTER — Ambulatory Visit (HOSPITAL_BASED_OUTPATIENT_CLINIC_OR_DEPARTMENT_OTHER): Payer: Medicare Other

## 2013-04-07 ENCOUNTER — Telehealth: Payer: Self-pay | Admitting: Internal Medicine

## 2013-04-07 ENCOUNTER — Other Ambulatory Visit (HOSPITAL_BASED_OUTPATIENT_CLINIC_OR_DEPARTMENT_OTHER): Payer: Medicare Other

## 2013-04-07 VITALS — BP 143/74 | HR 73 | Temp 97.5°F | Resp 18 | Ht 71.0 in | Wt 168.6 lb

## 2013-04-07 DIAGNOSIS — C7931 Secondary malignant neoplasm of brain: Secondary | ICD-10-CM

## 2013-04-07 DIAGNOSIS — C3430 Malignant neoplasm of lower lobe, unspecified bronchus or lung: Secondary | ICD-10-CM

## 2013-04-07 DIAGNOSIS — C3490 Malignant neoplasm of unspecified part of unspecified bronchus or lung: Secondary | ICD-10-CM

## 2013-04-07 DIAGNOSIS — Z5111 Encounter for antineoplastic chemotherapy: Secondary | ICD-10-CM

## 2013-04-07 DIAGNOSIS — C7949 Secondary malignant neoplasm of other parts of nervous system: Secondary | ICD-10-CM

## 2013-04-07 LAB — COMPREHENSIVE METABOLIC PANEL (CC13)
ALT: 23 U/L (ref 0–55)
ANION GAP: 10 meq/L (ref 3–11)
AST: 31 U/L (ref 5–34)
Albumin: 3.8 g/dL (ref 3.5–5.0)
Alkaline Phosphatase: 148 U/L (ref 40–150)
BUN: 14.6 mg/dL (ref 7.0–26.0)
CO2: 24 meq/L (ref 22–29)
CREATININE: 0.8 mg/dL (ref 0.7–1.3)
Calcium: 9.6 mg/dL (ref 8.4–10.4)
Chloride: 107 mEq/L (ref 98–109)
Glucose: 130 mg/dl (ref 70–140)
POTASSIUM: 3.9 meq/L (ref 3.5–5.1)
Sodium: 141 mEq/L (ref 136–145)
Total Bilirubin: 0.62 mg/dL (ref 0.20–1.20)
Total Protein: 7.1 g/dL (ref 6.4–8.3)

## 2013-04-07 LAB — CBC WITH DIFFERENTIAL/PLATELET
BASO%: 0.2 % (ref 0.0–2.0)
Basophils Absolute: 0 10*3/uL (ref 0.0–0.1)
EOS ABS: 0 10*3/uL (ref 0.0–0.5)
EOS%: 0 % (ref 0.0–7.0)
HEMATOCRIT: 27.5 % — AB (ref 38.4–49.9)
HGB: 9.5 g/dL — ABNORMAL LOW (ref 13.0–17.1)
LYMPH#: 0.4 10*3/uL — AB (ref 0.9–3.3)
LYMPH%: 9.4 % — ABNORMAL LOW (ref 14.0–49.0)
MCH: 33.3 pg (ref 27.2–33.4)
MCHC: 34.5 g/dL (ref 32.0–36.0)
MCV: 96.5 fL (ref 79.3–98.0)
MONO#: 0.6 10*3/uL (ref 0.1–0.9)
MONO%: 13.9 % (ref 0.0–14.0)
NEUT%: 76.5 % — AB (ref 39.0–75.0)
NEUTROS ABS: 3.2 10*3/uL (ref 1.5–6.5)
Platelets: 212 10*3/uL (ref 140–400)
RBC: 2.85 10*6/uL — ABNORMAL LOW (ref 4.20–5.82)
RDW: 16 % — AB (ref 11.0–14.6)
WBC: 4.2 10*3/uL (ref 4.0–10.3)

## 2013-04-07 MED ORDER — DEXAMETHASONE SODIUM PHOSPHATE 20 MG/5ML IJ SOLN
20.0000 mg | Freq: Once | INTRAMUSCULAR | Status: AC
  Administered 2013-04-07: 20 mg via INTRAVENOUS

## 2013-04-07 MED ORDER — CYANOCOBALAMIN 1000 MCG/ML IJ SOLN
1000.0000 ug | Freq: Once | INTRAMUSCULAR | Status: AC
  Administered 2013-04-07: 1000 ug via INTRAMUSCULAR

## 2013-04-07 MED ORDER — CYANOCOBALAMIN 1000 MCG/ML IJ SOLN
INTRAMUSCULAR | Status: AC
  Filled 2013-04-07: qty 1

## 2013-04-07 MED ORDER — HEPARIN SOD (PORK) LOCK FLUSH 100 UNIT/ML IV SOLN
500.0000 [IU] | Freq: Once | INTRAVENOUS | Status: DC | PRN
  Filled 2013-04-07: qty 5

## 2013-04-07 MED ORDER — SODIUM CHLORIDE 0.9 % IV SOLN
453.5000 mg | Freq: Once | INTRAVENOUS | Status: AC
  Administered 2013-04-07: 450 mg via INTRAVENOUS
  Filled 2013-04-07: qty 45

## 2013-04-07 MED ORDER — SODIUM CHLORIDE 0.9 % IV SOLN
Freq: Once | INTRAVENOUS | Status: AC
  Administered 2013-04-07: 12:00:00 via INTRAVENOUS

## 2013-04-07 MED ORDER — SODIUM CHLORIDE 0.9 % IV SOLN
500.0000 mg/m2 | Freq: Once | INTRAVENOUS | Status: AC
  Administered 2013-04-07: 950 mg via INTRAVENOUS
  Filled 2013-04-07: qty 38

## 2013-04-07 MED ORDER — ONDANSETRON 16 MG/50ML IVPB (CHCC)
16.0000 mg | Freq: Once | INTRAVENOUS | Status: AC
  Administered 2013-04-07: 16 mg via INTRAVENOUS

## 2013-04-07 MED ORDER — SODIUM CHLORIDE 0.9 % IJ SOLN
10.0000 mL | INTRAMUSCULAR | Status: DC | PRN
  Filled 2013-04-07: qty 10

## 2013-04-07 MED ORDER — ONDANSETRON 16 MG/50ML IVPB (CHCC)
INTRAVENOUS | Status: AC
  Filled 2013-04-07: qty 16

## 2013-04-07 MED ORDER — DEXAMETHASONE SODIUM PHOSPHATE 20 MG/5ML IJ SOLN
INTRAMUSCULAR | Status: AC
  Filled 2013-04-07: qty 5

## 2013-04-07 NOTE — Telephone Encounter (Signed)
gv adn printed aptp sched and avs for pt for March adn April....sed added tx.

## 2013-04-07 NOTE — Patient Instructions (Signed)
[name] Discharge Instructions for Patients Receiving Chemotherapy  Today you received the following chemotherapy agents alimta and carboplatin.  To help prevent nausea and vomiting after your treatment, we encourage you to take your nausea medication compazine.   If you develop nausea and vomiting that is not controlled by your nausea medication, call the clinic.   BELOW ARE SYMPTOMS THAT SHOULD BE REPORTED IMMEDIATELY:  *FEVER GREATER THAN 100.5 F  *CHILLS WITH OR WITHOUT FEVER  NAUSEA AND VOMITING THAT IS NOT CONTROLLED WITH YOUR NAUSEA MEDICATION  *UNUSUAL SHORTNESS OF BREATH  *UNUSUAL BRUISING OR BLEEDING  TENDERNESS IN MOUTH AND THROAT WITH OR WITHOUT PRESENCE OF ULCERS  *URINARY PROBLEMS  *BOWEL PROBLEMS  UNUSUAL RASH Items with * indicate a potential emergency and should be followed up as soon as possible.  Feel free to call the clinic you have any questions or concerns. The clinic phone number is [phone].

## 2013-04-07 NOTE — Patient Instructions (Signed)
Continue weekly labs as scheduled Follow up with Dr. Julien Nordmann in 3 weeks with a restaging CT scan of your chest, abdomen and pelvis to re-evaluate your disease.

## 2013-04-07 NOTE — Progress Notes (Signed)
[place] [phone]   [phone]  OFFICE PROGRESS NOTE  [name], MD [place] [address]  DIAGNOSIS: Stage IV ( T2b, N2, M1b) non-small cell lung cancer, adenocarcinoma with areas of squamous differentiation with negative EGFR mutation and negative ALK gene translocation, presented with left lower lobe lung mass in addition to mediastinal lymphadenopathy and brain metastases diagnosed in October of 2014  PRIOR THERAPY: 1) Stereotactic radiotherapy to 3 brain lesions under the care of Dr. Lisbeth Renshaw completed 12/08/2012. 2) Palliative radiotherapy to the left lower lobe lung mass under the care of Dr. Lisbeth Renshaw expected to be completed 01/01/2013.  CURRENT THERAPY: Systemic chemotherapy with carboplatin for AUC of 5 and Alimta 500 mg/M2 every 3 weeks. First dose 12/23/2012. Status post 5 cycles   CHEMOTHERAPY INTENT: Palliative  CURRENT # OF CHEMOTHERAPY CYCLES: 5  CURRENT ANTIEMETICS:Zofran, dexamethasone and Compazine  CURRENT SMOKING STATUS: former smoker  ORAL CHEMOTHERAPY AND CONSENT: None  CURRENT BISPHOSPHONATES USE: None  PAIN MANAGEMENT: 0/10  NARCOTICS INDUCED CONSTIPATION: None  LIVING WILL AND CODE STATUS: ?  INTERVAL HISTORY: [name] 80 y.o. male returns to the clinic today for follow up visit accompanied by his wife. The patient is feeling fine today with no specific complaints except for mild fatigue. He is tolerating his systemic chemotherapy with carboplatin and Alimta fairly well except for some generalized malaise and occasional episodes of diarrhea a few days after chemotherapy. These are self limiting symptoms. He reports a "burning feeling" in his lower abdomen in the same days after chemotherapy. He has not tried taking his antiemetic for this discomfort. The patient denied having any nausea or vomiting. He denied having any fever or chills. He has no chest pain, shortness breath, cough or  hemoptysis. He has no weight loss or night sweats. He is here today to start cycle #6 of his chemotherapy.  MEDICAL HISTORY: Past Medical History  Diagnosis Date  . COPD (chronic obstructive pulmonary disease)   . Allergy     allergic rhinitis  . Hx of radiation therapy 12/09/12-01/01/13    lung 37.5Gy  . nscl ca w/ brain mets dx'd 08/2012    Patient has a lung mass which is being evaluated    ALLERGIES:  is allergic to sulfa antibiotics.  MEDICATIONS:  Current Outpatient Prescriptions  Medication Sig Dispense Refill  . acetaminophen (TYLENOL) 500 MG tablet Take 500 mg by mouth every 6 (six) hours as needed.      . clobetasol cream (TEMOVATE) 4.62 % Apply 1 application topically daily as needed (for itching).      Marland Kitchen dexamethasone (DECADRON) 4 MG tablet 4 mg by mouth twice a day the day before, day of and day after the chemotherapy every 3 weeks  40 tablet  1  . ferrous sulfate (IRON SUPPLEMENT) 325 (65 FE) MG tablet Take 325 mg by mouth daily with breakfast.      . folic acid (FOLVITE) 703 MCG tablet Take 0.5 tablets (400 mcg total) by mouth daily.  30 tablet  3  . Polyethyl Glycol-Propyl Glycol (SYSTANE OP) Place 1 drop into both eyes daily.      . simvastatin (ZOCOR) 10 MG tablet Take 1 tablet (10 mg total) by mouth at bedtime.  90 tablet  3  . prochlorperazine (COMPAZINE) 10 MG tablet Take 1 tablet (10 mg total) by mouth every 6 (six) hours as needed for nausea or vomiting.  60 tablet  0  No current facility-administered medications for this visit.   Facility-Administered Medications Ordered in Other Visits  Medication Dose Route Frequency Provider Last Rate Last Dose  . heparin lock flush 100 unit/mL  500 Units Intracatheter Once PRN Si Gaul, MD      . sodium chloride 0.9 % injection 10 mL  10 mL Intracatheter PRN Si Gaul, MD        SURGICAL HISTORY:  Past Surgical History  Procedure Laterality Date  . Cholecystectomy      REVIEW OF SYSTEMS:   Constitutional: positive for malaise Eyes: negative Ears, nose, mouth, throat, and face: negative Respiratory: negative Cardiovascular: negative Gastrointestinal: positive for diarrhea and Occasional episodes that occur a few days after chemotherapy and spontaneously resolved Genitourinary:negative Integument/breast: negative Hematologic/lymphatic: negative Musculoskeletal:negative Neurological: negative Behavioral/Psych: negative Endocrine: negative Allergic/Immunologic: negative   PHYSICAL EXAMINATION: General appearance: alert, cooperative and no distress Head: Normocephalic, without obvious abnormality, atraumatic Neck: no adenopathy, no JVD, supple, symmetrical, trachea midline and thyroid not enlarged, symmetric, no tenderness/mass/nodules Lymph nodes: Cervical, supraclavicular, and axillary nodes normal. Resp: clear to auscultation bilaterally Back: symmetric, no curvature. ROM normal. No CVA tenderness. Cardio: regular rate and rhythm, S1, S2 normal, no murmur, click, rub or gallop GI: soft, non-tender; bowel sounds normal; no masses,  no organomegaly Extremities: extremities normal, atraumatic, no cyanosis or edema Neurologic: Alert and oriented X 3, normal strength and tone. Normal symmetric reflexes. Normal coordination and gait  ECOG PERFORMANCE STATUS: 1 - Symptomatic but completely ambulatory  Blood pressure 143/74, pulse 73, temperature 97.5 F (36.4 C), temperature source Oral, resp. rate 18, height 5\' 11"  (1.803 m), weight 168 lb 9.6 oz (76.476 kg), SpO2 98.00%.  LABORATORY DATA: Lab Results  Component Value Date   WBC 4.2 04/07/2013   HGB 9.5* 04/07/2013   HCT 27.5* 04/07/2013   MCV 96.5 04/07/2013   PLT 212 04/07/2013      Chemistry      Component Value Date/Time   NA 141 04/07/2013 1017   NA CANCELED 10/13/2012 1149   NA 138 06/13/2007 0420   K 3.9 04/07/2013 1017   K CANCELED 10/13/2012 1149   CL CANCELED 10/13/2012 1149   CO2 24 04/07/2013 1017   CO2  CANCELED 10/13/2012 1149   BUN 14.6 04/07/2013 1017   BUN CANCELED 10/13/2012 1149   BUN 10 06/13/2007 0420   CREATININE 0.8 04/07/2013 1017   CREATININE 1.00 11/10/2012 1431      Component Value Date/Time   CALCIUM 9.6 04/07/2013 1017   CALCIUM CANCELED 10/13/2012 1149   ALKPHOS 148 04/07/2013 1017   ALKPHOS CANCELED 10/13/2012 1149   AST 31 04/07/2013 1017   AST CANCELED 10/13/2012 1149   ALT 23 04/07/2013 1017   ALT CANCELED 10/13/2012 1149   BILITOT 0.62 04/07/2013 1017   BILITOT CANCELED 10/13/2012 1149       RADIOGRAPHIC STUDIES:   ASSESSMENT AND PLAN: this is a very pleasant 80 years old white male with metastatic non-small cell lung cancer, adenocarcinoma with brain metastasis status post stereotactic radiotherapy to the brain lesions followed by palliative radiotherapy to the left lower lobe lung mass. The patient is currently undergoing systemic chemotherapy with carboplatin and Alimta status post 5 cycles. He is tolerating his treatment fairly well. Hi s labs were reviewed. He will proceed with cycle #6 of his systemic chemotherapy with carboplatin and Alimta today as scheduled. He'll continue with weekly labs as scheduled. He will followup with Dr. 62 in 3 weeks with restaging CT scan of his chest,  abdomen and pelvis with contrast to reevaluate his disease. Patient states that he will try taking his antiemetic for the stomach/lower abdominal discomfort that he gets a few days after chemotherapy. The patient voices understanding of current disease status and treatment options and is in agreement with the current care plan.  All questions were answered. The patient knows to call the clinic with any problems, questions or concerns. We can certainly see the patient much sooner if necessary.  Carlton Adam, PA-C   Disclaimer: This note was dictated with voice recognition software. Similar sounding words can inadvertently be transcribed and may not be corrected upon review.

## 2013-04-09 ENCOUNTER — Other Ambulatory Visit: Payer: Self-pay | Admitting: Medical Oncology

## 2013-04-13 ENCOUNTER — Telehealth: Payer: Self-pay | Admitting: *Deleted

## 2013-04-13 NOTE — Telephone Encounter (Signed)
Called patient to inform that this MRI on 06-09-13 cannot be done @ [facility], due to the fact that it is a 3 T MRI it has to be done @ [facility], spoke with patient's wife and she vocalized understanding this.

## 2013-04-14 ENCOUNTER — Other Ambulatory Visit (HOSPITAL_BASED_OUTPATIENT_CLINIC_OR_DEPARTMENT_OTHER): Payer: Medicare Other

## 2013-04-14 DIAGNOSIS — C3430 Malignant neoplasm of lower lobe, unspecified bronchus or lung: Secondary | ICD-10-CM

## 2013-04-14 DIAGNOSIS — C3490 Malignant neoplasm of unspecified part of unspecified bronchus or lung: Secondary | ICD-10-CM

## 2013-04-14 LAB — COMPREHENSIVE METABOLIC PANEL (CC13)
ALK PHOS: 164 U/L — AB (ref 40–150)
ALT: 26 U/L (ref 0–55)
AST: 29 U/L (ref 5–34)
Albumin: 3.8 g/dL (ref 3.5–5.0)
Anion Gap: 10 mEq/L (ref 3–11)
BUN: 19.7 mg/dL (ref 7.0–26.0)
CALCIUM: 9.2 mg/dL (ref 8.4–10.4)
CO2: 25 mEq/L (ref 22–29)
Chloride: 102 mEq/L (ref 98–109)
Creatinine: 0.8 mg/dL (ref 0.7–1.3)
Glucose: 78 mg/dl (ref 70–140)
POTASSIUM: 4 meq/L (ref 3.5–5.1)
SODIUM: 137 meq/L (ref 136–145)
TOTAL PROTEIN: 7.1 g/dL (ref 6.4–8.3)
Total Bilirubin: 0.7 mg/dL (ref 0.20–1.20)

## 2013-04-14 LAB — CBC WITH DIFFERENTIAL/PLATELET
BASO%: 2.2 % — ABNORMAL HIGH (ref 0.0–2.0)
Basophils Absolute: 0 10*3/uL (ref 0.0–0.1)
EOS%: 2.4 % (ref 0.0–7.0)
Eosinophils Absolute: 0 10*3/uL (ref 0.0–0.5)
HCT: 27.8 % — ABNORMAL LOW (ref 38.4–49.9)
HGB: 9.9 g/dL — ABNORMAL LOW (ref 13.0–17.1)
LYMPH%: 24 % (ref 14.0–49.0)
MCH: 34.8 pg — AB (ref 27.2–33.4)
MCHC: 35.4 g/dL (ref 32.0–36.0)
MCV: 98.3 fL — AB (ref 79.3–98.0)
MONO#: 0.2 10*3/uL (ref 0.1–0.9)
MONO%: 12.8 % (ref 0.0–14.0)
NEUT%: 58.6 % (ref 39.0–75.0)
NEUTROS ABS: 1.1 10*3/uL — AB (ref 1.5–6.5)
Platelets: 159 10*3/uL (ref 140–400)
RBC: 2.83 10*6/uL — AB (ref 4.20–5.82)
RDW: 15.5 % — AB (ref 11.0–14.6)
WBC: 1.9 10*3/uL — ABNORMAL LOW (ref 4.0–10.3)
lymph#: 0.5 10*3/uL — ABNORMAL LOW (ref 0.9–3.3)

## 2013-04-21 ENCOUNTER — Other Ambulatory Visit (HOSPITAL_BASED_OUTPATIENT_CLINIC_OR_DEPARTMENT_OTHER): Payer: Medicare Other

## 2013-04-21 DIAGNOSIS — C3490 Malignant neoplasm of unspecified part of unspecified bronchus or lung: Secondary | ICD-10-CM

## 2013-04-21 DIAGNOSIS — C3430 Malignant neoplasm of lower lobe, unspecified bronchus or lung: Secondary | ICD-10-CM

## 2013-04-21 DIAGNOSIS — C7931 Secondary malignant neoplasm of brain: Secondary | ICD-10-CM

## 2013-04-21 DIAGNOSIS — C7949 Secondary malignant neoplasm of other parts of nervous system: Secondary | ICD-10-CM

## 2013-04-21 LAB — CBC WITH DIFFERENTIAL/PLATELET
BASO%: 0.2 % (ref 0.0–2.0)
Basophils Absolute: 0 10*3/uL (ref 0.0–0.1)
EOS ABS: 0 10*3/uL (ref 0.0–0.5)
EOS%: 1.4 % (ref 0.0–7.0)
HCT: 24.5 % — ABNORMAL LOW (ref 38.4–49.9)
HGB: 8.4 g/dL — ABNORMAL LOW (ref 13.0–17.1)
LYMPH%: 15.1 % (ref 14.0–49.0)
MCH: 34.5 pg — AB (ref 27.2–33.4)
MCHC: 34.4 g/dL (ref 32.0–36.0)
MCV: 100.2 fL — ABNORMAL HIGH (ref 79.3–98.0)
MONO#: 0.6 10*3/uL (ref 0.1–0.9)
MONO%: 21.7 % — AB (ref 0.0–14.0)
NEUT#: 1.8 10*3/uL (ref 1.5–6.5)
NEUT%: 61.6 % (ref 39.0–75.0)
PLATELETS: 65 10*3/uL — AB (ref 140–400)
RBC: 2.44 10*6/uL — ABNORMAL LOW (ref 4.20–5.82)
RDW: 15.6 % — AB (ref 11.0–14.6)
WBC: 2.9 10*3/uL — ABNORMAL LOW (ref 4.0–10.3)
lymph#: 0.4 10*3/uL — ABNORMAL LOW (ref 0.9–3.3)

## 2013-04-21 LAB — COMPREHENSIVE METABOLIC PANEL (CC13)
ALK PHOS: 154 U/L — AB (ref 40–150)
ALT: 19 U/L (ref 0–55)
AST: 24 U/L (ref 5–34)
Albumin: 3.4 g/dL — ABNORMAL LOW (ref 3.5–5.0)
Anion Gap: 7 mEq/L (ref 3–11)
BILIRUBIN TOTAL: 0.56 mg/dL (ref 0.20–1.20)
BUN: 9.6 mg/dL (ref 7.0–26.0)
CO2: 28 mEq/L (ref 22–29)
Calcium: 9 mg/dL (ref 8.4–10.4)
Chloride: 107 mEq/L (ref 98–109)
Creatinine: 0.7 mg/dL (ref 0.7–1.3)
GLUCOSE: 96 mg/dL (ref 70–140)
POTASSIUM: 4 meq/L (ref 3.5–5.1)
SODIUM: 142 meq/L (ref 136–145)
TOTAL PROTEIN: 6.6 g/dL (ref 6.4–8.3)

## 2013-04-23 ENCOUNTER — Ambulatory Visit (HOSPITAL_COMMUNITY)
Admission: RE | Admit: 2013-04-23 | Discharge: 2013-04-23 | Disposition: A | Discharge status: Home / Self Care | Payer: Medicare Other | Source: Ambulatory Visit | Attending: Physician Assistant | Admitting: Physician Assistant

## 2013-04-23 ENCOUNTER — Encounter (HOSPITAL_COMMUNITY): Payer: Self-pay

## 2013-04-23 DIAGNOSIS — I2584 Coronary atherosclerosis due to calcified coronary lesion: Secondary | ICD-10-CM | POA: Insufficient documentation

## 2013-04-23 DIAGNOSIS — N400 Enlarged prostate without lower urinary tract symptoms: Secondary | ICD-10-CM | POA: Insufficient documentation

## 2013-04-23 DIAGNOSIS — N200 Calculus of kidney: Secondary | ICD-10-CM | POA: Insufficient documentation

## 2013-04-23 DIAGNOSIS — C3490 Malignant neoplasm of unspecified part of unspecified bronchus or lung: Secondary | ICD-10-CM

## 2013-04-23 DIAGNOSIS — K5710 Diverticulosis of small intestine without perforation or abscess without bleeding: Secondary | ICD-10-CM | POA: Insufficient documentation

## 2013-04-23 DIAGNOSIS — Z923 Personal history of irradiation: Secondary | ICD-10-CM | POA: Insufficient documentation

## 2013-04-23 MED ORDER — IOHEXOL 300 MG/ML  SOLN
100.0000 mL | Freq: Once | INTRAMUSCULAR | Status: AC | PRN
  Administered 2013-04-23: 100 mL via INTRAVENOUS

## 2013-04-24 ENCOUNTER — Ambulatory Visit (HOSPITAL_COMMUNITY): Payer: Medicare Other

## 2013-04-24 ENCOUNTER — Telehealth: Payer: Self-pay | Admitting: *Deleted

## 2013-04-24 NOTE — Telephone Encounter (Signed)
Pt called wanting to know whether he would need to take his steroids this week the day before, of and after chemo.   He is scheduled for chemo on 4/14.  Advised he go ahead and take the steroids and if he does not need the chemo he would not have to take the rest of the doses.  SLJ

## 2013-04-28 ENCOUNTER — Telehealth: Payer: Self-pay | Admitting: Internal Medicine

## 2013-04-28 ENCOUNTER — Encounter: Payer: Self-pay | Admitting: Physician Assistant

## 2013-04-28 ENCOUNTER — Telehealth: Payer: Self-pay | Admitting: *Deleted

## 2013-04-28 ENCOUNTER — Ambulatory Visit: Payer: Medicare Other

## 2013-04-28 ENCOUNTER — Other Ambulatory Visit (HOSPITAL_BASED_OUTPATIENT_CLINIC_OR_DEPARTMENT_OTHER): Payer: Medicare Other

## 2013-04-28 ENCOUNTER — Ambulatory Visit (HOSPITAL_BASED_OUTPATIENT_CLINIC_OR_DEPARTMENT_OTHER): Payer: Medicare Other | Admitting: Physician Assistant

## 2013-04-28 VITALS — BP 148/76 | HR 79 | Temp 98.0°F | Resp 18 | Ht 71.0 in | Wt 170.6 lb

## 2013-04-28 DIAGNOSIS — C7931 Secondary malignant neoplasm of brain: Secondary | ICD-10-CM

## 2013-04-28 DIAGNOSIS — C3490 Malignant neoplasm of unspecified part of unspecified bronchus or lung: Secondary | ICD-10-CM

## 2013-04-28 DIAGNOSIS — C3430 Malignant neoplasm of lower lobe, unspecified bronchus or lung: Secondary | ICD-10-CM

## 2013-04-28 DIAGNOSIS — C7949 Secondary malignant neoplasm of other parts of nervous system: Secondary | ICD-10-CM

## 2013-04-28 LAB — COMPREHENSIVE METABOLIC PANEL (CC13)
ALT: 12 U/L (ref 0–55)
ANION GAP: 8 meq/L (ref 3–11)
AST: 28 U/L (ref 5–34)
Albumin: 3.7 g/dL (ref 3.5–5.0)
Alkaline Phosphatase: 147 U/L (ref 40–150)
BUN: 8.1 mg/dL (ref 7.0–26.0)
CO2: 27 meq/L (ref 22–29)
Calcium: 9.5 mg/dL (ref 8.4–10.4)
Chloride: 106 mEq/L (ref 98–109)
Creatinine: 0.8 mg/dL (ref 0.7–1.3)
GLUCOSE: 97 mg/dL (ref 70–140)
Potassium: 3.9 mEq/L (ref 3.5–5.1)
Sodium: 142 mEq/L (ref 136–145)
Total Bilirubin: 0.45 mg/dL (ref 0.20–1.20)
Total Protein: 7 g/dL (ref 6.4–8.3)

## 2013-04-28 LAB — CBC WITH DIFFERENTIAL/PLATELET
BASO%: 0.7 % (ref 0.0–2.0)
Basophils Absolute: 0 10*3/uL (ref 0.0–0.1)
EOS%: 3.7 % (ref 0.0–7.0)
Eosinophils Absolute: 0.1 10*3/uL (ref 0.0–0.5)
HCT: 27.4 % — ABNORMAL LOW (ref 38.4–49.9)
HGB: 9.4 g/dL — ABNORMAL LOW (ref 13.0–17.1)
LYMPH%: 17.9 % (ref 14.0–49.0)
MCH: 34.2 pg — ABNORMAL HIGH (ref 27.2–33.4)
MCHC: 34.2 g/dL (ref 32.0–36.0)
MCV: 100 fL — AB (ref 79.3–98.0)
MONO#: 0.7 10*3/uL (ref 0.1–0.9)
MONO%: 23.4 % — AB (ref 0.0–14.0)
NEUT%: 54.3 % (ref 39.0–75.0)
NEUTROS ABS: 1.6 10*3/uL (ref 1.5–6.5)
PLATELETS: 228 10*3/uL (ref 140–400)
RBC: 2.74 10*6/uL — ABNORMAL LOW (ref 4.20–5.82)
RDW: 15.8 % — ABNORMAL HIGH (ref 11.0–14.6)
WBC: 2.9 10*3/uL — ABNORMAL LOW (ref 4.0–10.3)
lymph#: 0.5 10*3/uL — ABNORMAL LOW (ref 0.9–3.3)

## 2013-04-28 NOTE — Telephone Encounter (Signed)
Per staff message and POF I have scheduled appts.  JMW  

## 2013-04-28 NOTE — Telephone Encounter (Signed)
Gave pt apt for lab and MD for April 2015, emailed michelle regarding chemo

## 2013-04-28 NOTE — Progress Notes (Addendum)
[place] [phone]   [phone]) [phone]  SHARED VISIT PROGRESS NOTE  [name], MD [place] [address]  DIAGNOSIS: Stage IV ( T2b, N2, M1b) non-small cell lung cancer, adenocarcinoma with areas of squamous differentiation with negative EGFR mutation and negative ALK gene translocation, presented with left lower lobe lung mass in addition to mediastinal lymphadenopathy and brain metastases diagnosed in October of 2014  PRIOR THERAPY: 1) Stereotactic radiotherapy to 3 brain lesions under the care of Dr. Lisbeth Renshaw completed 12/08/2012. 2) Palliative radiotherapy to the left lower lobe lung mass under the care of Dr. Lisbeth Renshaw expected to be completed 01/01/2013.  CURRENT THERAPY: Systemic chemotherapy with carboplatin for AUC of 5 and Alimta 500 mg/M2 every 3 weeks. First dose 12/23/2012. Status post 6 cycles   CHEMOTHERAPY INTENT: Palliative  CURRENT # OF CHEMOTHERAPY CYCLES: 6  CURRENT ANTIEMETICS:Zofran, dexamethasone and Compazine  CURRENT SMOKING STATUS: former smoker  ORAL CHEMOTHERAPY AND CONSENT: None  CURRENT BISPHOSPHONATES USE: None  PAIN MANAGEMENT: 0/10  NARCOTICS INDUCED CONSTIPATION: None  LIVING WILL AND CODE STATUS: ?  INTERVAL HISTORY: [name] 80 y.o. male returns to the clinic today for follow up visit accompanied by his wife. The patient is feeling fine today with no specific complaints except for  fatigue. He reports having difficulty breathing and in general being more short of breath with exertion after this last cycle of chemotherapy. He is status post 6 cycles of systemic chemotherapy with carboplatin and Alimta. He states that lasted for about 7 or 8 days.  The patient denied having any nausea or vomiting. He denied having any fever or chills. He has no chest pain, shortness breath, cough or hemoptysis. He has no weight loss or night sweats. He recently had a restaging CT scan of the chest, abdomen  pelvis and is here to discuss the results. Both he and his wife state that he is quite tired and is ready for a bit of a chemotherapy break possible. He is trying to tie some things up.  MEDICAL HISTORY: Past Medical History  Diagnosis Date  . COPD (chronic obstructive pulmonary disease)   . Allergy     allergic rhinitis  . Hx of radiation therapy 12/09/12-01/01/13    lung 37.5Gy  . nscl ca w/ brain mets dx'd 08/2012    Patient has a lung mass which is being evaluated    ALLERGIES:  is allergic to sulfa antibiotics.  MEDICATIONS:  Current Outpatient Prescriptions  Medication Sig Dispense Refill  . acetaminophen (TYLENOL) 500 MG tablet Take 500 mg by mouth every 6 (six) hours as needed.      . clobetasol cream (TEMOVATE) 3.32 % Apply 1 application topically daily as needed (for itching).      Marland Kitchen dexamethasone (DECADRON) 4 MG tablet 4 mg by mouth twice a day the day before, day of and day after the chemotherapy every 3 weeks  40 tablet  1  . ferrous sulfate (IRON SUPPLEMENT) 325 (65 FE) MG tablet Take 325 mg by mouth daily with breakfast.      . folic acid (FOLVITE) 951 MCG tablet Take 0.5 tablets (400 mcg total) by mouth daily.  30 tablet  3  . loratadine (ALLERGY RELIEF) 10 MG tablet Take 10 mg by mouth daily.      Vladimir Faster Glycol-Propyl Glycol (SYSTANE OP) Place 1 drop into both eyes daily.      . simvastatin (ZOCOR) 10 MG tablet Take 1  tablet (10 mg total) by mouth at bedtime.  90 tablet  3  . prochlorperazine (COMPAZINE) 10 MG tablet Take 1 tablet (10 mg total) by mouth every 6 (six) hours as needed for nausea or vomiting.  60 tablet  0   No current facility-administered medications for this visit.    SURGICAL HISTORY:  Past Surgical History  Procedure Laterality Date  . Cholecystectomy      REVIEW OF SYSTEMS:  Constitutional: positive for fatigue and malaise Eyes: negative Ears, nose, mouth, throat, and face: negative Respiratory: positive for dyspnea on  exertion Cardiovascular: negative Gastrointestinal: negative Genitourinary:negative Integument/breast: negative Hematologic/lymphatic: negative Musculoskeletal:negative Neurological: negative Behavioral/Psych: negative Endocrine: negative Allergic/Immunologic: negative   PHYSICAL EXAMINATION: General appearance: alert, cooperative, fatigued and no distress Head: Normocephalic, without obvious abnormality, atraumatic Neck: no adenopathy, no JVD, supple, symmetrical, trachea midline and thyroid not enlarged, symmetric, no tenderness/mass/nodules Lymph nodes: Cervical, supraclavicular, and axillary nodes normal. Resp: clear to auscultation bilaterally Back: symmetric, no curvature. ROM normal. No CVA tenderness. Cardio: regular rate and rhythm, S1, S2 normal, no murmur, click, rub or gallop GI: soft, non-tender; bowel sounds normal; no masses,  no organomegaly Extremities: extremities normal, atraumatic, no cyanosis or edema Neurologic: Alert and oriented X 3, normal strength and tone. Normal symmetric reflexes. Normal coordination and gait  ECOG PERFORMANCE STATUS: 1 - Symptomatic but completely ambulatory  Blood pressure 148/76, pulse 79, temperature 98 F (36.7 C), temperature source Oral, resp. rate 18, height $RemoveBe'5\' 11"'YxawxtsDC$  (1.803 m), weight 170 lb 9.6 oz (77.384 kg), SpO2 97.00%.  LABORATORY DATA: Lab Results  Component Value Date   WBC 2.9* 04/28/2013   HGB 9.4* 04/28/2013   HCT 27.4* 04/28/2013   MCV 100.0* 04/28/2013   PLT 228 04/28/2013      Chemistry      Component Value Date/Time   NA 142 04/28/2013 1120   NA CANCELED 10/13/2012 1149   NA 138 06/13/2007 0420   K 3.9 04/28/2013 1120   K CANCELED 10/13/2012 1149   CL CANCELED 10/13/2012 1149   CO2 27 04/28/2013 1120   CO2 CANCELED 10/13/2012 1149   BUN 8.1 04/28/2013 1120   BUN CANCELED 10/13/2012 1149   BUN 10 06/13/2007 0420   CREATININE 0.8 04/28/2013 1120   CREATININE 1.00 11/10/2012 1431      Component Value Date/Time    CALCIUM 9.5 04/28/2013 1120   CALCIUM CANCELED 10/13/2012 1149   ALKPHOS 147 04/28/2013 1120   ALKPHOS CANCELED 10/13/2012 1149   AST 28 04/28/2013 1120   AST CANCELED 10/13/2012 1149   ALT 12 04/28/2013 1120   ALT CANCELED 10/13/2012 1149   BILITOT 0.45 04/28/2013 1120   BILITOT CANCELED 10/13/2012 1149       RADIOGRAPHIC STUDIES: Ct Chest W Contrast  04/23/2013   CLINICAL DATA:  Non-small-cell lung cancer.  EXAM: CT CHEST, ABDOMEN, AND PELVIS WITH CONTRAST  TECHNIQUE: Multidetector CT imaging of the chest, abdomen and pelvis was performed following the standard protocol during bolus administration of intravenous contrast.  CONTRAST:  119mL OMNIPAQUE IOHEXOL 300 MG/ML  SOLN  COMPARISON:  None.  CT ABD/PELVIS W CM dated 02/20/2013; CT CHEST W/CM dated 10/14/2012  FINDINGS: CT CHEST FINDINGS  Mediastinal lymph nodes are not enlarged by CT size criteria. No hilar or axillary adenopathy. Atherosclerotic calcification of the arterial vasculature, including coronary arteries. Heart size normal. No pericardial effusion.  Biapical pleural parenchymal scarring. Centrilobular emphysema. Cavitary mass in the central portion of the superior segment right lower lobe measures 2.8 x 4.6 cm,  stable. Surrounding ground-glass and mild architectural distortion are indicative of evolving changes of radiation therapy. Areas of subpleural reticulation are seen predominantly in the left lung, as before. No pleural fluid. Narrowing of the right lower lobe bronchi. Airway is otherwise unremarkable.  CT ABDOMEN AND PELVIS FINDINGS  Subtle irregularity at the liver margin. Liver is otherwise unremarkable. Cholecystectomy. Adrenal glands are unremarkable. Tiny stone in the right kidney. Kidneys and spleen are otherwise unremarkable. There are tiny calcifications associated with the uncinate process of the pancreas, as before, suggesting mild chronic calcific pancreatitis. Pancreas and stomach are otherwise unremarkable PE Duodenal  diverticulum. Small bowel and colon are otherwise unremarkable. Prostate is enlarged. Extensive atherosclerotic calcification of the arterial vasculature without abdominal aortic aneurysm. Small right inguinal hernia contains fat. No pathologically enlarged lymph nodes. No free fluid. Degenerative changes are seen in the spine.  IMPRESSION: 1. Cavitary right lower lobe mass is stable. Surrounding evolutionary changes of radiation therapy in the right lower lobe. No evidence of metastatic disease. 2. Mild subpleural fibrotic changes ni the left upper and left lower lobes. 3. Coronary artery calcification. 4. Mild marginal irregularity of the liver is indicative of cirrhosis. 5. Tiny right renal stone. 6. Enlarged prostate.   Electronically Signed   By: Leanna Battles M.D.   On: 04/23/2013 12:18   Ct Abdomen Pelvis W Contrast  04/23/2013   CLINICAL DATA:  Non-small-cell lung cancer.  EXAM: CT CHEST, ABDOMEN, AND PELVIS WITH CONTRAST  TECHNIQUE: Multidetector CT imaging of the chest, abdomen and pelvis was performed following the standard protocol during bolus administration of intravenous contrast.  CONTRAST:  OMNIPAQUE IOHEXOL 300 MG/ML  SOLN  COMPARISON:  None.  CT ABD/PELVIS W CM dated 02/20/2013; CT CHEST W/CM dated 10/14/2012  FINDINGS: CT CHEST FINDINGS  Mediastinal lymph nodes are not enlarged by CT size criteria. No hilar or axillary adenopathy. Atherosclerotic calcification of the arterial vasculature, including coronary arteries. Heart size normal. No pericardial effusion.  Biapical pleural parenchymal scarring. Centrilobular emphysema. Cavitary mass in the central portion of the superior segment right lower lobe measures 2.8 x 4.6 cm, stable. Surrounding ground-glass and mild architectural distortion are indicative of evolving changes of radiation therapy. Areas of subpleural reticulation are seen predominantly in the left lung, as before. No pleural fluid. Narrowing of the right lower lobe bronchi.  Airway is otherwise unremarkable.  CT ABDOMEN AND PELVIS FINDINGS  Subtle irregularity at the liver margin. Liver is otherwise unremarkable. Cholecystectomy. Adrenal glands are unremarkable. Tiny stone in the right kidney. Kidneys and spleen are otherwise unremarkable. There are tiny calcifications associated with the uncinate process of the pancreas, as before, suggesting mild chronic calcific pancreatitis. Pancreas and stomach are otherwise unremarkable PE Duodenal diverticulum. Small bowel and colon are otherwise unremarkable. Prostate is enlarged. Extensive atherosclerotic calcification of the arterial vasculature without abdominal aortic aneurysm. Small right inguinal hernia contains fat. No pathologically enlarged lymph nodes. No free fluid. Degenerative changes are seen in the spine.  IMPRESSION: 1. Cavitary right lower lobe mass is stable. Surrounding evolutionary changes of radiation therapy in the right lower lobe. No evidence of metastatic disease. 2. Mild subpleural fibrotic changes ni the left upper and left lower lobes. 3. Coronary artery calcification. 4. Mild marginal irregularity of the liver is indicative of cirrhosis. 5. Tiny right renal stone. 6. Enlarged prostate.   Electronically Signed   By: Leanna Battles M.D.   On: 04/23/2013 12:18   ASSESSMENT AND PLAN: this is a very pleasant 80 years old white  male with metastatic non-small cell lung cancer, adenocarcinoma with brain metastasis status post stereotactic radiotherapy to the brain lesions followed by palliative radiotherapy to the left lower lobe lung mass. The patient is status post 6 cycles of systemic chemotherapy with carboplatin and Alimta. Overall he tolerated his course of treatment relatively well with the exception of increasing fatigue and shortness of breath with exertion. Patient was discussed with also seen by Dr. Julien Nordmann. His restaging CT scan revealed stable disease. There is no evidence for any metastatic disease. Dr.  Earlie Server discussed proceeding with maintenance chemotherapy with single agent Alimta. Patient was to take a two-week break prior to starting the maintenance chemotherapy so that he can tied a few things up and builder strength. Patient will return in 2 weeks with a repeat CBC differential and C. met to proceed with his first cycle of maintenance chemotherapy with single agent Alimta at 500 mg per meter squared given every 3 weeks.   The patient voices understanding of current disease status and treatment options and is in agreement with the current care plan.  All questions were answered. The patient knows to call the clinic with any problems, questions or concerns. We can certainly see the patient much sooner if necessary.  Carlton Adam, PA-C  ADDENDUM: Hematology/Oncology Attending: I had a face to face encounter with the patient. I recommended his care plan. This is a very pleasant 80 years old white male with stage IV non-small cell lung cancer, adenocarcinoma status post 6 cycles of systemic chemotherapy with carboplatin and Alimta with stable disease after cycle #6. He tolerated his previous treatment fairly well with no significant adverse effects. I have a lengthy discussion with the patient and his wife about his scan results and treatment options. I gave the patient the option of continuous observation and close monitoring with repeat scan versus consideration of maintenance chemotherapy with single agent Alimta. The patient is interested in considering the treatment with single agent Alimta. I discussed with her the adverse effect of this treatment. He is expected to start the first cycle of his maintenance therapy in 2 weeks. He would come back for followup visit at that time. The patient was advised to call immediately if he has any concerning symptoms in the interval.  Disclaimer: This note was dictated with voice recognition software. Similar sounding words can inadvertently be  transcribed and may not be corrected upon review. Curt Bears, MD 04/29/2013

## 2013-04-29 NOTE — Patient Instructions (Signed)
Return in 2 weeks to begin maintenance chemotherapy with single agent Alimta

## 2013-05-04 ENCOUNTER — Telehealth: Payer: Self-pay | Admitting: Internal Medicine

## 2013-05-04 NOTE — Telephone Encounter (Signed)
talked to pt's wife and she is aware of all appt on April 2015

## 2013-05-12 ENCOUNTER — Telehealth: Payer: Self-pay | Admitting: Internal Medicine

## 2013-05-12 ENCOUNTER — Ambulatory Visit (HOSPITAL_BASED_OUTPATIENT_CLINIC_OR_DEPARTMENT_OTHER): Payer: Medicare Other | Admitting: Physician Assistant

## 2013-05-12 ENCOUNTER — Ambulatory Visit (HOSPITAL_BASED_OUTPATIENT_CLINIC_OR_DEPARTMENT_OTHER): Payer: Medicare Other

## 2013-05-12 ENCOUNTER — Encounter: Payer: Self-pay | Admitting: Physician Assistant

## 2013-05-12 ENCOUNTER — Other Ambulatory Visit (HOSPITAL_BASED_OUTPATIENT_CLINIC_OR_DEPARTMENT_OTHER): Payer: Medicare Other

## 2013-05-12 ENCOUNTER — Telehealth: Payer: Self-pay | Admitting: *Deleted

## 2013-05-12 VITALS — BP 139/81 | HR 78 | Temp 97.6°F | Resp 18 | Ht 71.0 in | Wt 172.0 lb

## 2013-05-12 DIAGNOSIS — C3490 Malignant neoplasm of unspecified part of unspecified bronchus or lung: Secondary | ICD-10-CM

## 2013-05-12 DIAGNOSIS — C7931 Secondary malignant neoplasm of brain: Secondary | ICD-10-CM

## 2013-05-12 DIAGNOSIS — C7949 Secondary malignant neoplasm of other parts of nervous system: Secondary | ICD-10-CM

## 2013-05-12 DIAGNOSIS — C3430 Malignant neoplasm of lower lobe, unspecified bronchus or lung: Secondary | ICD-10-CM

## 2013-05-12 DIAGNOSIS — Z5111 Encounter for antineoplastic chemotherapy: Secondary | ICD-10-CM

## 2013-05-12 LAB — CBC WITH DIFFERENTIAL/PLATELET
BASO%: 0.8 % (ref 0.0–2.0)
Basophils Absolute: 0.1 10*3/uL (ref 0.0–0.1)
EOS%: 0 % (ref 0.0–7.0)
Eosinophils Absolute: 0 10*3/uL (ref 0.0–0.5)
HEMATOCRIT: 30.5 % — AB (ref 38.4–49.9)
HGB: 10.4 g/dL — ABNORMAL LOW (ref 13.0–17.1)
LYMPH%: 4.7 % — AB (ref 14.0–49.0)
MCH: 33 pg (ref 27.2–33.4)
MCHC: 34.1 g/dL (ref 32.0–36.0)
MCV: 96.8 fL (ref 79.3–98.0)
MONO#: 0.2 10*3/uL (ref 0.1–0.9)
MONO%: 2.4 % (ref 0.0–14.0)
NEUT%: 92.1 % — ABNORMAL HIGH (ref 39.0–75.0)
NEUTROS ABS: 7 10*3/uL — AB (ref 1.5–6.5)
NRBC: 0 % (ref 0–0)
Platelets: 145 10*3/uL (ref 140–400)
RBC: 3.15 10*6/uL — AB (ref 4.20–5.82)
RDW: 14 % (ref 11.0–14.6)
WBC: 7.6 10*3/uL (ref 4.0–10.3)
lymph#: 0.4 10*3/uL — ABNORMAL LOW (ref 0.9–3.3)

## 2013-05-12 LAB — COMPREHENSIVE METABOLIC PANEL (CC13)
ALBUMIN: 3.6 g/dL (ref 3.5–5.0)
ALT: 16 U/L (ref 0–55)
AST: 32 U/L (ref 5–34)
Alkaline Phosphatase: 149 U/L (ref 40–150)
Anion Gap: 9 mEq/L (ref 3–11)
BILIRUBIN TOTAL: 0.47 mg/dL (ref 0.20–1.20)
BUN: 10.7 mg/dL (ref 7.0–26.0)
CO2: 23 mEq/L (ref 22–29)
Calcium: 9.4 mg/dL (ref 8.4–10.4)
Chloride: 108 mEq/L (ref 98–109)
Creatinine: 0.8 mg/dL (ref 0.7–1.3)
Glucose: 145 mg/dl — ABNORMAL HIGH (ref 70–140)
POTASSIUM: 3.9 meq/L (ref 3.5–5.1)
SODIUM: 140 meq/L (ref 136–145)
Total Protein: 7.2 g/dL (ref 6.4–8.3)

## 2013-05-12 MED ORDER — DEXAMETHASONE SODIUM PHOSPHATE 10 MG/ML IJ SOLN
INTRAMUSCULAR | Status: AC
  Filled 2013-05-12: qty 1

## 2013-05-12 MED ORDER — PEMETREXED DISODIUM CHEMO INJECTION 500 MG
500.0000 mg/m2 | Freq: Once | INTRAVENOUS | Status: AC
  Administered 2013-05-12: 975 mg via INTRAVENOUS
  Filled 2013-05-12: qty 39

## 2013-05-12 MED ORDER — SODIUM CHLORIDE 0.9 % IV SOLN
Freq: Once | INTRAVENOUS | Status: AC
  Administered 2013-05-12: 15:00:00 via INTRAVENOUS

## 2013-05-12 MED ORDER — DEXAMETHASONE SODIUM PHOSPHATE 10 MG/ML IJ SOLN
10.0000 mg | Freq: Once | INTRAMUSCULAR | Status: AC
  Administered 2013-05-12: 10 mg via INTRAVENOUS

## 2013-05-12 MED ORDER — ONDANSETRON 8 MG/50ML IVPB (CHCC)
8.0000 mg | Freq: Once | INTRAVENOUS | Status: AC
  Administered 2013-05-12: 8 mg via INTRAVENOUS

## 2013-05-12 MED ORDER — ONDANSETRON 8 MG/NS 50 ML IVPB
INTRAVENOUS | Status: AC
  Filled 2013-05-12: qty 8

## 2013-05-12 NOTE — Telephone Encounter (Signed)
gave pt appt for labm md and chemo for May 2015, emailed michelle regarding chemo appt for june 2015

## 2013-05-12 NOTE — Telephone Encounter (Signed)
Per staff message and POF I have scheduled appts.  JMW  

## 2013-05-12 NOTE — Patient Instructions (Signed)
[facility] Discharge Instructions for Patients Receiving Chemotherapy  Today you received the following chemotherapy agents Alimta  To help prevent nausea and vomiting after your treatment, we encourage you to take your nausea medication as needed   If you develop nausea and vomiting that is not controlled by your nausea medication, call the clinic.   BELOW ARE SYMPTOMS THAT SHOULD BE REPORTED IMMEDIATELY:  *FEVER GREATER THAN 100.5 F  *CHILLS WITH OR WITHOUT FEVER  NAUSEA AND VOMITING THAT IS NOT CONTROLLED WITH YOUR NAUSEA MEDICATION  *UNUSUAL SHORTNESS OF BREATH  *UNUSUAL BRUISING OR BLEEDING  TENDERNESS IN MOUTH AND THROAT WITH OR WITHOUT PRESENCE OF ULCERS  *URINARY PROBLEMS  *BOWEL PROBLEMS  UNUSUAL RASH Items with * indicate a potential emergency and should be followed up as soon as possible.  Feel free to call the clinic you have any questions or concerns. The clinic phone number is [phone].

## 2013-05-12 NOTE — Progress Notes (Addendum)
[facility] [phone]   [phone]  SHARED VISIT PROGRESS NOTE  [name], MD [place] [address]  DIAGNOSIS: Stage IV ( T2b, N2, M1b) non-small cell lung cancer, adenocarcinoma with areas of squamous differentiation with negative EGFR mutation and negative ALK gene translocation, presented with left lower lobe lung mass in addition to mediastinal lymphadenopathy and brain metastases diagnosed in October of 2014  PRIOR THERAPY: 1) Stereotactic radiotherapy to 3 brain lesions under the care of Dr. Lisbeth Renshaw completed 12/08/2012. 2) Palliative radiotherapy to the left lower lobe lung mass under the care of Dr. Lisbeth Renshaw expected to be completed 01/01/2013. 3) Systemic chemotherapy with carboplatin for AUC of 5 and Alimta 500 mg/M2 every 3 weeks. First dose 12/23/2012. Status post 6 cycles  CURRENT THERAPY: Maintenance chemotherapy with single agent Alimta at 500 mg per meter squared given every 3 weeks. First cycle expected to be given 05/12/2013   CHEMOTHERAPY INTENT: Palliative  CURRENT # OF CHEMOTHERAPY CYCLES: 1  CURRENT ANTIEMETICS:Zofran, dexamethasone and Compazine  CURRENT SMOKING STATUS: former smoker  ORAL CHEMOTHERAPY AND CONSENT: None  CURRENT BISPHOSPHONATES USE: None  PAIN MANAGEMENT: 0/10  NARCOTICS INDUCED CONSTIPATION: None  LIVING WILL AND CODE STATUS: ?  INTERVAL HISTORY: [name] 80 y.o. male returns to the clinic today for follow up visit accompanied by his wife. The patient is feeling fine today with no specific complaints. Overall he is feeling better. He is currently taken over-the-counter iron supplement. His energy level has improved. He presents to proceed with his first cycle of maintenance chemotherapy with single agent Alimta.   The patient denied having any nausea or vomiting. He denied having any fever or chills. He has no chest pain, shortness breath, cough or hemoptysis. He has no weight  loss or night sweats.  MEDICAL HISTORY: Past Medical History  Diagnosis Date  . COPD (chronic obstructive pulmonary disease)   . Allergy     allergic rhinitis  . Hx of radiation therapy 12/09/12-01/01/13    lung 37.5Gy  . nscl ca w/ brain mets dx'd 08/2012    Patient has a lung mass which is being evaluated    ALLERGIES:  is allergic to sulfa antibiotics.  MEDICATIONS:  Current Outpatient Prescriptions  Medication Sig Dispense Refill  . acetaminophen (TYLENOL) 500 MG tablet Take 500 mg by mouth every 6 (six) hours as needed.      . clobetasol cream (TEMOVATE) 7.93 % Apply 1 application topically daily as needed (for itching).      Marland Kitchen dexamethasone (DECADRON) 4 MG tablet 4 mg by mouth twice a day the day before, day of and day after the chemotherapy every 3 weeks  40 tablet  1  . ferrous sulfate (IRON SUPPLEMENT) 325 (65 FE) MG tablet Take 325 mg by mouth daily with breakfast.      . folic acid (FOLVITE) 903 MCG tablet Take 0.5 tablets (400 mcg total) by mouth daily.  30 tablet  3  . loratadine (ALLERGY RELIEF) 10 MG tablet Take 10 mg by mouth daily.      Vladimir Faster Glycol-Propyl Glycol (SYSTANE OP) Place 1 drop into both eyes daily.      . simvastatin (ZOCOR) 10 MG tablet Take 1 tablet (10 mg total) by mouth at bedtime.  90 tablet  3  . prochlorperazine (COMPAZINE) 10 MG tablet Take 1 tablet (10 mg total) by mouth every 6 (six) hours as needed for nausea or vomiting.  [place]  tablet  0   No current facility-administered medications for this visit.    SURGICAL HISTORY:  Past Surgical History  Procedure Laterality Date  . Cholecystectomy      REVIEW OF SYSTEMS:  Constitutional: positive for fatigue and Improved Eyes: negative Ears, nose, mouth, throat, and face: negative Respiratory: positive for dyspnea on exertion Cardiovascular: negative Gastrointestinal: negative Genitourinary:negative Integument/breast: negative Hematologic/lymphatic:  negative Musculoskeletal:negative Neurological: negative Behavioral/Psych: negative Endocrine: negative Allergic/Immunologic: negative   PHYSICAL EXAMINATION: General appearance: alert, cooperative, fatigued and no distress Head: Normocephalic, without obvious abnormality, atraumatic Neck: no adenopathy, no JVD, supple, symmetrical, trachea midline and thyroid not enlarged, symmetric, no tenderness/mass/nodules Lymph nodes: Cervical, supraclavicular, and axillary nodes normal. Resp: clear to auscultation bilaterally Back: symmetric, no curvature. ROM normal. No CVA tenderness. Cardio: regular rate and rhythm, S1, S2 normal, no murmur, click, rub or gallop GI: soft, non-tender; bowel sounds normal; no masses,  no organomegaly Extremities: extremities normal, atraumatic, no cyanosis or edema Neurologic: Alert and oriented X 3, normal strength and tone. Normal symmetric reflexes. Normal coordination and gait  ECOG PERFORMANCE STATUS: 1 - Symptomatic but completely ambulatory  Blood pressure 139/81, pulse 78, temperature 97.6 F (36.4 C), temperature source Oral, resp. rate 18, height $RemoveBe'5\' 11"'zxfpNlWrB$  (1.803 m), weight 172 lb (78.019 kg), SpO2 98.00%.  LABORATORY DATA: Lab Results  Component Value Date   WBC 7.6 05/12/2013   HGB 10.4* 05/12/2013   HCT 30.5* 05/12/2013   MCV 96.8 05/12/2013   PLT 145 05/12/2013      Chemistry      Component Value Date/Time   NA 140 05/12/2013 1314   NA CANCELED 10/13/2012 1149   NA 138 06/13/2007 0420   K 3.9 05/12/2013 1314   K CANCELED 10/13/2012 1149   CL CANCELED 10/13/2012 1149   CO2 23 05/12/2013 1314   CO2 CANCELED 10/13/2012 1149   BUN 10.7 05/12/2013 1314   BUN CANCELED 10/13/2012 1149   BUN 10 06/13/2007 0420   CREATININE 0.8 05/12/2013 1314   CREATININE 1.00 11/10/2012 1431      Component Value Date/Time   CALCIUM 9.4 05/12/2013 1314   CALCIUM CANCELED 10/13/2012 1149   ALKPHOS 149 05/12/2013 1314   ALKPHOS CANCELED 10/13/2012 1149   AST 32 05/12/2013  1314   AST CANCELED 10/13/2012 1149   ALT 16 05/12/2013 1314   ALT CANCELED 10/13/2012 1149   BILITOT 0.47 05/12/2013 1314   BILITOT CANCELED 10/13/2012 1149       RADIOGRAPHIC STUDIES: Ct Chest W Contrast  04/23/2013   CLINICAL DATA:  Non-small-cell lung cancer.  EXAM: CT CHEST, ABDOMEN, AND PELVIS WITH CONTRAST  TECHNIQUE: Multidetector CT imaging of the chest, abdomen and pelvis was performed following the standard protocol during bolus administration of intravenous contrast.  CONTRAST:  134mL OMNIPAQUE IOHEXOL 300 MG/ML  SOLN  COMPARISON:  None.  CT ABD/PELVIS W CM dated 02/20/2013; CT CHEST W/CM dated 10/14/2012  FINDINGS: CT CHEST FINDINGS  Mediastinal lymph nodes are not enlarged by CT size criteria. No hilar or axillary adenopathy. Atherosclerotic calcification of the arterial vasculature, including coronary arteries. Heart size normal. No pericardial effusion.  Biapical pleural parenchymal scarring. Centrilobular emphysema. Cavitary mass in the central portion of the superior segment right lower lobe measures 2.8 x 4.6 cm, stable. Surrounding ground-glass and mild architectural distortion are indicative of evolving changes of radiation therapy. Areas of subpleural reticulation are seen predominantly in the left lung, as before. No pleural fluid. Narrowing of the right lower lobe bronchi. Airway is otherwise unremarkable.  CT ABDOMEN AND PELVIS FINDINGS  Subtle irregularity at the liver margin. Liver is otherwise unremarkable. Cholecystectomy. Adrenal glands are unremarkable. Tiny stone in the right kidney. Kidneys and spleen are otherwise unremarkable. There are tiny calcifications associated with the uncinate process of the pancreas, as before, suggesting mild chronic calcific pancreatitis. Pancreas and stomach are otherwise unremarkable PE Duodenal diverticulum. Small bowel and colon are otherwise unremarkable. Prostate is enlarged. Extensive atherosclerotic calcification of the arterial vasculature  without abdominal aortic aneurysm. Small right inguinal hernia contains fat. No pathologically enlarged lymph nodes. No free fluid. Degenerative changes are seen in the spine.  IMPRESSION: 1. Cavitary right lower lobe mass is stable. Surrounding evolutionary changes of radiation therapy in the right lower lobe. No evidence of metastatic disease. 2. Mild subpleural fibrotic changes ni the left upper and left lower lobes. 3. Coronary artery calcification. 4. Mild marginal irregularity of the liver is indicative of cirrhosis. 5. Tiny right renal stone. 6. Enlarged prostate.   Electronically Signed   By: Leanna Battles M.D.   On: 04/23/2013 12:18   Ct Abdomen Pelvis W Contrast  04/23/2013   CLINICAL DATA:  Non-small-cell lung cancer.  EXAM: CT CHEST, ABDOMEN, AND PELVIS WITH CONTRAST  TECHNIQUE: Multidetector CT imaging of the chest, abdomen and pelvis was performed following the standard protocol during bolus administration of intravenous contrast.  CONTRAST:  OMNIPAQUE IOHEXOL 300 MG/ML  SOLN  COMPARISON:  None.  CT ABD/PELVIS W CM dated 02/20/2013; CT CHEST W/CM dated 10/14/2012  FINDINGS: CT CHEST FINDINGS  Mediastinal lymph nodes are not enlarged by CT size criteria. No hilar or axillary adenopathy. Atherosclerotic calcification of the arterial vasculature, including coronary arteries. Heart size normal. No pericardial effusion.  Biapical pleural parenchymal scarring. Centrilobular emphysema. Cavitary mass in the central portion of the superior segment right lower lobe measures 2.8 x 4.6 cm, stable. Surrounding ground-glass and mild architectural distortion are indicative of evolving changes of radiation therapy. Areas of subpleural reticulation are seen predominantly in the left lung, as before. No pleural fluid. Narrowing of the right lower lobe bronchi. Airway is otherwise unremarkable.  CT ABDOMEN AND PELVIS FINDINGS  Subtle irregularity at the liver margin. Liver is otherwise unremarkable.  Cholecystectomy. Adrenal glands are unremarkable. Tiny stone in the right kidney. Kidneys and spleen are otherwise unremarkable. There are tiny calcifications associated with the uncinate process of the pancreas, as before, suggesting mild chronic calcific pancreatitis. Pancreas and stomach are otherwise unremarkable PE Duodenal diverticulum. Small bowel and colon are otherwise unremarkable. Prostate is enlarged. Extensive atherosclerotic calcification of the arterial vasculature without abdominal aortic aneurysm. Small right inguinal hernia contains fat. No pathologically enlarged lymph nodes. No free fluid. Degenerative changes are seen in the spine.  IMPRESSION: 1. Cavitary right lower lobe mass is stable. Surrounding evolutionary changes of radiation therapy in the right lower lobe. No evidence of metastatic disease. 2. Mild subpleural fibrotic changes ni the left upper and left lower lobes. 3. Coronary artery calcification. 4. Mild marginal irregularity of the liver is indicative of cirrhosis. 5. Tiny right renal stone. 6. Enlarged prostate.   Electronically Signed   By: Leanna Battles M.D.   On: 04/23/2013 12:18   ASSESSMENT AND PLAN: this is a very pleasant 80 years old white male with metastatic non-small cell lung cancer, adenocarcinoma with brain metastasis status post stereotactic radiotherapy to the brain lesions followed by palliative radiotherapy to the left lower lobe lung mass. The patient is status post 6 cycles of systemic chemotherapy with carboplatin and  Alimta. Overall he tolerated his course of treatment relatively well with the exception of increasing fatigue and shortness of breath with exertion. Patient was discussed with  Dr. Julien Nordmann. His restaging CT scan revealed stable disease. There is no evidence for any metastatic disease. The patient will proceed with his first cycle of maintenance chemotherapy with single agent Alimta 500 mg per meter square given every 3 weeks today as  scheduled. He'll return in 3 weeks prior to cycle #2 with a repeat CBC differential and C. met. Patient is advised to continue his over-the-counter iron supplement.   The patient voices understanding of current disease status and treatment options and is in agreement with the current care plan.  All questions were answered. The patient knows to call the clinic with any problems, questions or concerns. We can certainly see the patient much sooner if necessary.  Carlton Adam, PA-C    Disclaimer: This note was dictated with voice recognition software. Similar sounding words can inadvertently be transcribed and may not be corrected upon review.

## 2013-05-12 NOTE — Patient Instructions (Signed)
Followup in 3 weeks prior to her next scheduled cycle of maintenance chemotherapy

## 2013-06-02 ENCOUNTER — Ambulatory Visit (HOSPITAL_BASED_OUTPATIENT_CLINIC_OR_DEPARTMENT_OTHER): Payer: Medicare Other

## 2013-06-02 ENCOUNTER — Telehealth: Payer: Self-pay | Admitting: Internal Medicine

## 2013-06-02 ENCOUNTER — Ambulatory Visit (HOSPITAL_BASED_OUTPATIENT_CLINIC_OR_DEPARTMENT_OTHER): Payer: Medicare Other | Admitting: Internal Medicine

## 2013-06-02 ENCOUNTER — Other Ambulatory Visit (HOSPITAL_BASED_OUTPATIENT_CLINIC_OR_DEPARTMENT_OTHER): Payer: Medicare Other

## 2013-06-02 ENCOUNTER — Telehealth: Payer: Self-pay | Admitting: *Deleted

## 2013-06-02 ENCOUNTER — Encounter: Payer: Self-pay | Admitting: Internal Medicine

## 2013-06-02 VITALS — BP 141/78 | HR 68 | Temp 96.9°F | Resp 18 | Ht 71.0 in | Wt 167.8 lb

## 2013-06-02 DIAGNOSIS — R5381 Other malaise: Secondary | ICD-10-CM

## 2013-06-02 DIAGNOSIS — C3430 Malignant neoplasm of lower lobe, unspecified bronchus or lung: Secondary | ICD-10-CM

## 2013-06-02 DIAGNOSIS — C3490 Malignant neoplasm of unspecified part of unspecified bronchus or lung: Secondary | ICD-10-CM

## 2013-06-02 DIAGNOSIS — C7949 Secondary malignant neoplasm of other parts of nervous system: Secondary | ICD-10-CM

## 2013-06-02 DIAGNOSIS — R5383 Other fatigue: Secondary | ICD-10-CM

## 2013-06-02 DIAGNOSIS — Z5111 Encounter for antineoplastic chemotherapy: Secondary | ICD-10-CM

## 2013-06-02 DIAGNOSIS — C7931 Secondary malignant neoplasm of brain: Secondary | ICD-10-CM

## 2013-06-02 LAB — CBC WITH DIFFERENTIAL/PLATELET
BASO%: 0.1 % (ref 0.0–2.0)
BASOS ABS: 0 10*3/uL (ref 0.0–0.1)
EOS ABS: 0 10*3/uL (ref 0.0–0.5)
EOS%: 0 % (ref 0.0–7.0)
HCT: 30.7 % — ABNORMAL LOW (ref 38.4–49.9)
HEMOGLOBIN: 10.4 g/dL — AB (ref 13.0–17.1)
LYMPH%: 5.2 % — AB (ref 14.0–49.0)
MCH: 31.9 pg (ref 27.2–33.4)
MCHC: 33.9 g/dL (ref 32.0–36.0)
MCV: 94.2 fL (ref 79.3–98.0)
MONO#: 0.4 10*3/uL (ref 0.1–0.9)
MONO%: 5.6 % (ref 0.0–14.0)
NEUT#: 6.7 10*3/uL — ABNORMAL HIGH (ref 1.5–6.5)
NEUT%: 89.1 % — AB (ref 39.0–75.0)
PLATELETS: 209 10*3/uL (ref 140–400)
RBC: 3.26 10*6/uL — AB (ref 4.20–5.82)
RDW: 13.1 % (ref 11.0–14.6)
WBC: 7.5 10*3/uL (ref 4.0–10.3)
lymph#: 0.4 10*3/uL — ABNORMAL LOW (ref 0.9–3.3)

## 2013-06-02 LAB — COMPREHENSIVE METABOLIC PANEL (CC13)
ALK PHOS: 158 U/L — AB (ref 40–150)
ALT: 15 U/L (ref 0–55)
ANION GAP: 12 meq/L — AB (ref 3–11)
AST: 23 U/L (ref 5–34)
Albumin: 3.6 g/dL (ref 3.5–5.0)
BUN: 13.9 mg/dL (ref 7.0–26.0)
CO2: 23 meq/L (ref 22–29)
CREATININE: 0.8 mg/dL (ref 0.7–1.3)
Calcium: 9.6 mg/dL (ref 8.4–10.4)
Chloride: 106 mEq/L (ref 98–109)
GLUCOSE: 122 mg/dL (ref 70–140)
Potassium: 4 mEq/L (ref 3.5–5.1)
Sodium: 141 mEq/L (ref 136–145)
TOTAL PROTEIN: 7.2 g/dL (ref 6.4–8.3)
Total Bilirubin: 0.48 mg/dL (ref 0.20–1.20)

## 2013-06-02 MED ORDER — ONDANSETRON 8 MG/NS 50 ML IVPB
INTRAVENOUS | Status: AC
  Filled 2013-06-02: qty 8

## 2013-06-02 MED ORDER — DEXAMETHASONE SODIUM PHOSPHATE 10 MG/ML IJ SOLN
10.0000 mg | Freq: Once | INTRAMUSCULAR | Status: AC
  Administered 2013-06-02: 10 mg via INTRAVENOUS

## 2013-06-02 MED ORDER — SODIUM CHLORIDE 0.9 % IV SOLN
Freq: Once | INTRAVENOUS | Status: AC
  Administered 2013-06-02: 16:00:00 via INTRAVENOUS

## 2013-06-02 MED ORDER — DEXAMETHASONE SODIUM PHOSPHATE 10 MG/ML IJ SOLN
INTRAMUSCULAR | Status: AC
  Filled 2013-06-02: qty 1

## 2013-06-02 MED ORDER — SODIUM CHLORIDE 0.9 % IV SOLN
500.0000 mg/m2 | Freq: Once | INTRAVENOUS | Status: AC
  Administered 2013-06-02: 975 mg via INTRAVENOUS
  Filled 2013-06-02: qty 39

## 2013-06-02 MED ORDER — ONDANSETRON 8 MG/50ML IVPB (CHCC)
8.0000 mg | Freq: Once | INTRAVENOUS | Status: AC
  Administered 2013-06-02: 8 mg via INTRAVENOUS

## 2013-06-02 NOTE — Telephone Encounter (Signed)
Gave pt appt for lab,md and chemo for June 2015

## 2013-06-02 NOTE — Progress Notes (Signed)
[facility] [phone]   [phone]  OFFICE PROGRESS NOTE  [name], MD [place] [address]  DIAGNOSIS: Stage IV ( T2b, N2, M1b) non-small cell lung cancer, adenocarcinoma with areas of squamous differentiation with negative EGFR mutation and negative ALK gene translocation, presented with left lower lobe lung mass in addition to mediastinal lymphadenopathy and brain metastases diagnosed in October of 2014  PRIOR THERAPY: 1) Stereotactic radiotherapy to 3 brain lesions under the care of Dr. Lisbeth Renshaw completed 12/08/2012. 2) Palliative radiotherapy to the left lower lobe lung mass under the care of Dr. Lisbeth Renshaw expected to be completed 01/01/2013. 3) Systemic chemotherapy with carboplatin for AUC of 5 and Alimta 500 mg/M2 every 3 weeks. First dose 12/23/2012. Status post 6 cycles  CURRENT THERAPY: Maintenance chemotherapy with single agent Alimta at 500 mg per meter squared given every 3 weeks. First cycle expected to be given 05/12/2013. Status post 1 cycle.   CHEMOTHERAPY INTENT: Palliative  CURRENT # OF CHEMOTHERAPY CYCLES: 2  CURRENT ANTIEMETICS:Zofran, dexamethasone and Compazine  CURRENT SMOKING STATUS: former smoker  ORAL CHEMOTHERAPY AND CONSENT: None  CURRENT BISPHOSPHONATES USE: None  PAIN MANAGEMENT: 0/10  NARCOTICS INDUCED CONSTIPATION: None  LIVING WILL AND CODE STATUS: ?  INTERVAL HISTORY: [name] 80 y.o. male returns to the clinic today for follow up visit accompanied by his wife. He tolerated the first cycle of his maintenance treatment fairly well except for profound fatigue around 8 days after the treatment. He is feeling much better today. The patient denied having any nausea or vomiting. He denied having any fever or chills. He has no chest pain, shortness of breath, cough or hemoptysis. He has no weight loss or night sweats. He is here today to start cycle #2 of his chemotherapy.  MEDICAL  HISTORY: Past Medical History  Diagnosis Date  . COPD (chronic obstructive pulmonary disease)   . Allergy     allergic rhinitis  . Hx of radiation therapy 12/09/12-01/01/13    lung 37.5Gy  . nscl ca w/ brain mets dx'd 08/2012    Patient has a lung mass which is being evaluated    ALLERGIES:  is allergic to sulfa antibiotics.  MEDICATIONS:  Current Outpatient Prescriptions  Medication Sig Dispense Refill  . acetaminophen (TYLENOL) 500 MG tablet Take 500 mg by mouth every 6 (six) hours as needed.      . clobetasol cream (TEMOVATE) 0.30 % Apply 1 application topically daily as needed (for itching).      Marland Kitchen dexamethasone (DECADRON) 4 MG tablet 4 mg by mouth twice a day the day before, day of and day after the chemotherapy every 3 weeks  40 tablet  1  . ferrous sulfate (IRON SUPPLEMENT) 325 (65 FE) MG tablet Take 325 mg by mouth daily with breakfast.      . folic acid (FOLVITE) 092 MCG tablet Take 0.5 tablets (400 mcg total) by mouth daily.  30 tablet  3  . loratadine (ALLERGY RELIEF) 10 MG tablet Take 10 mg by mouth daily.      Vladimir Faster Glycol-Propyl Glycol (SYSTANE OP) Place 1 drop into both eyes daily.      . prochlorperazine (COMPAZINE) 10 MG tablet Take 1 tablet (10 mg total) by mouth every 6 (six) hours as needed for nausea or vomiting.  60 tablet  0  . simvastatin (ZOCOR) 10 MG tablet Take 1 tablet (10 mg total) by mouth at bedtime.  90 tablet  3   No current facility-administered medications for this visit.    SURGICAL HISTORY:  Past Surgical History  Procedure Laterality Date  . Cholecystectomy      REVIEW OF SYSTEMS:  Constitutional: negative Eyes: negative Ears, nose, mouth, throat, and face: negative Respiratory: negative Cardiovascular: negative Gastrointestinal: negative Genitourinary:negative Integument/breast: negative Hematologic/lymphatic: negative Musculoskeletal:negative Neurological: negative Behavioral/Psych: negative Endocrine:  negative Allergic/Immunologic: negative   PHYSICAL EXAMINATION: General appearance: alert, cooperative and no distress Head: Normocephalic, without obvious abnormality, atraumatic Neck: no adenopathy, no JVD, supple, symmetrical, trachea midline and thyroid not enlarged, symmetric, no tenderness/mass/nodules Lymph nodes: Cervical, supraclavicular, and axillary nodes normal. Resp: clear to auscultation bilaterally Back: symmetric, no curvature. ROM normal. No CVA tenderness. Cardio: regular rate and rhythm, S1, S2 normal, no murmur, click, rub or gallop GI: soft, non-tender; bowel sounds normal; no masses,  no organomegaly Extremities: extremities normal, atraumatic, no cyanosis or edema Neurologic: Alert and oriented X 3, normal strength and tone. Normal symmetric reflexes. Normal coordination and gait  ECOG PERFORMANCE STATUS: 1 - Symptomatic but completely ambulatory  There were no vitals taken for this visit.  LABORATORY DATA: Lab Results  Component Value Date   WBC 7.5 06/02/2013   HGB 10.4* 06/02/2013   HCT 30.7* 06/02/2013   MCV 94.2 06/02/2013   PLT 209 06/02/2013      Chemistry      Component Value Date/Time   NA 140 05/12/2013 1314   NA CANCELED 10/13/2012 1149   NA 138 06/13/2007 0420   K 3.9 05/12/2013 1314   K CANCELED 10/13/2012 1149   CL CANCELED 10/13/2012 1149   CO2 23 05/12/2013 1314   CO2 CANCELED 10/13/2012 1149   BUN 10.7 05/12/2013 1314   BUN CANCELED 10/13/2012 1149   BUN 10 06/13/2007 0420   CREATININE 0.8 05/12/2013 1314   CREATININE 1.00 11/10/2012 1431      Component Value Date/Time   CALCIUM 9.4 05/12/2013 1314   CALCIUM CANCELED 10/13/2012 1149   ALKPHOS 149 05/12/2013 1314   ALKPHOS CANCELED 10/13/2012 1149   AST 32 05/12/2013 1314   AST CANCELED 10/13/2012 1149   ALT 16 05/12/2013 1314   ALT CANCELED 10/13/2012 1149   BILITOT 0.47 05/12/2013 1314   BILITOT CANCELED 10/13/2012 1149       RADIOGRAPHIC STUDIES:   ASSESSMENT AND PLAN: this is a very  pleasant 80 years old white male with metastatic non-small cell lung cancer, adenocarcinoma with brain metastasis status post stereotactic radiotherapy to the brain lesions followed by palliative radiotherapy to the left lower lobe lung mass. The patient is currently undergoing systemic chemotherapy with carboplatin and Alimta status post 6 cycles with stable disease and he is currently undergoing maintenance chemotherapy with single agent Alimta status post 1 cycle. He is tolerating the treatment well except for prolonged fatigue which could be residual fatigue from his previous combination chemotherapy. I recommended for the patient to proceed with cycle #2 today as scheduled. He would come back for followup visit in 3 weeks with the next cycle of his treatment. The patient voices understanding of current disease status and treatment options and is in agreement with the current care plan.  All questions were answered. The patient knows to call the clinic with any problems, questions or concerns. We can certainly see the patient much sooner if necessary.  Disclaimer: This note was dictated with voice recognition software. Similar sounding words can inadvertently be transcribed and may not be corrected upon review.

## 2013-06-02 NOTE — Telephone Encounter (Signed)
Per staff message and POF I have scheduled appts.  JMW  

## 2013-06-03 ENCOUNTER — Telehealth: Payer: Self-pay | Admitting: Internal Medicine

## 2013-06-03 NOTE — Telephone Encounter (Signed)
Talked to pt's wife amd she is aware of lab,md and chemo for june 2015

## 2013-06-09 ENCOUNTER — Other Ambulatory Visit: Payer: Medicare Other

## 2013-06-10 ENCOUNTER — Ambulatory Visit: Payer: Medicare Other | Admitting: Radiation Oncology

## 2013-06-19 ENCOUNTER — Ambulatory Visit
Admission: RE | Admit: 2013-06-19 | Discharge: 2013-06-19 | Disposition: A | Discharge status: Home / Self Care | Payer: Medicare Other | Source: Ambulatory Visit | Attending: Radiation Oncology | Admitting: Radiation Oncology

## 2013-06-19 DIAGNOSIS — C7949 Secondary malignant neoplasm of other parts of nervous system: Principal | ICD-10-CM

## 2013-06-19 DIAGNOSIS — C7931 Secondary malignant neoplasm of brain: Secondary | ICD-10-CM

## 2013-06-19 MED ORDER — GADOBENATE DIMEGLUMINE 529 MG/ML IV SOLN
15.0000 mL | Freq: Once | INTRAVENOUS | Status: AC | PRN
  Administered 2013-06-19: 15 mL via INTRAVENOUS

## 2013-06-22 ENCOUNTER — Ambulatory Visit
Admission: RE | Admit: 2013-06-22 | Discharge: 2013-06-22 | Disposition: A | Discharge status: Home / Self Care | Payer: Medicare Other | Source: Ambulatory Visit | Attending: Radiation Oncology | Admitting: Radiation Oncology

## 2013-06-22 ENCOUNTER — Ambulatory Visit (HOSPITAL_BASED_OUTPATIENT_CLINIC_OR_DEPARTMENT_OTHER)
Admission: RE | Admit: 2013-06-22 | Discharge: 2013-06-22 | Disposition: A | Discharge status: Home / Self Care | Payer: Medicare Other | Source: Ambulatory Visit | Attending: Radiation Oncology | Admitting: Radiation Oncology

## 2013-06-22 ENCOUNTER — Ambulatory Visit
Admission: RE | Admit: 2013-06-22 | Discharge: 2013-06-22 | Disposition: A | Discharge status: Home / Self Care | Payer: Medicare Other | Source: Ambulatory Visit | Attending: Neurosurgery | Admitting: Neurosurgery

## 2013-06-22 ENCOUNTER — Encounter: Payer: Self-pay | Admitting: Radiation Oncology

## 2013-06-22 VITALS — BP 125/67 | HR 73 | Temp 97.8°F | Resp 20 | Wt 168.9 lb

## 2013-06-22 DIAGNOSIS — C3430 Malignant neoplasm of lower lobe, unspecified bronchus or lung: Secondary | ICD-10-CM

## 2013-06-22 DIAGNOSIS — C7931 Secondary malignant neoplasm of brain: Secondary | ICD-10-CM

## 2013-06-22 NOTE — Progress Notes (Signed)
Radiation Oncology         [phone] ________________________________  Name: [name] MRN: [id number]  Date: 06/22/2013  DOB: 1933/12/01  Follow-Up Visit Note  CC: [name], MD  [name], MD  Diagnosis:     No history exists.      Narrative:  The patient returns today for routine follow-up.  The patient states that she is doing well. No headache and no nausea recently. With regards to systemic treatment, the patient is continuing with systemic treatment through Dr. Julien Nordmann. He states that he has some difficulties and has not feel well for approximately 10 days after chemotherapy. No specific concerning complaints however.  The patient underwent a recent MRI scan of the brain. This has been reviewed in multidisciplinary brain conference. No signs of recurrent or new disease seen. Each of the 3 previously treated areas are slightly smaller. I have personally reviewed this MRI scan.                              ALLERGIES:  is allergic to sulfa antibiotics.  Meds: Current Outpatient Prescriptions  Medication Sig Dispense Refill  . acetaminophen (TYLENOL) 500 MG tablet Take 500 mg by mouth every 6 (six) hours as needed.      . clobetasol cream (TEMOVATE) 4.09 % Apply 1 application topically daily as needed (for itching).      Marland Kitchen dexamethasone (DECADRON) 4 MG tablet 4 mg by mouth twice a day the day before, day of and day after the chemotherapy every 3 weeks  40 tablet  1  . ferrous sulfate (IRON SUPPLEMENT) 325 (65 FE) MG tablet Take 325 mg by mouth daily with breakfast.      . folic acid (FOLVITE) 811 MCG tablet Take 0.5 tablets (400 mcg total) by mouth daily.  30 tablet  3  . loratadine (ALLERGY RELIEF) 10 MG tablet Take 10 mg by mouth daily.      Vladimir Faster Glycol-Propyl Glycol (SYSTANE OP) Place 1 drop into both eyes daily.      . simvastatin (ZOCOR) 10 MG tablet Take 1 tablet (10 mg total) by mouth at bedtime.  90 tablet  3  . prochlorperazine (COMPAZINE) 10 MG  tablet Take 1 tablet (10 mg total) by mouth every 6 (six) hours as needed for nausea or vomiting.  60 tablet  0   No current facility-administered medications for this encounter.    Physical Findings: The patient is in no acute distress. Patient is alert and oriented.  weight is 168 lb 14.4 oz (76.613 kg). His oral temperature is 97.8 F (36.6 C). His blood pressure is 125/67 and his pulse is 73. His respiration is 20. Marland Kitchen   General: Well-developed, in no acute distress HEENT: Normocephalic, atraumatic Cardiovascular: Regular rate and rhythm Respiratory: Clear to auscultation bilaterally GI: Soft, nontender, normal bowel sounds Extremities: No edema present   Lab Findings: Lab Results  Component Value Date   WBC 7.5 06/02/2013   HGB 10.4* 06/02/2013   HCT 30.7* 06/02/2013   MCV 94.2 06/02/2013   PLT 209 06/02/2013     Radiographic Findings: Mr [name] Wo Contrast  06/19/2013   CLINICAL DATA:  [name] six-month restaging. Metastatic lung cancer.  EXAM: MRI HEAD WITHOUT AND WITH CONTRAST  TECHNIQUE: Multiplanar, multiecho pulse sequences of the brain and surrounding structures were obtained without and with intravenous contrast.  CONTRAST:  75mL MULTIHANCE GADOBENATE DIMEGLUMINE 529 MG/ML IV  SOLN  COMPARISON:  03/13/2013.  12/02/2012.  FINDINGS: Diffusion imaging does not show any acute or subacute infarction. There are chronic small-vessel changes of the white matter. No cortical infarction. No hydrocephalus or extra-axial collection.  There are no new or progressive lesions. Three metastatic lesions being followed are all very minimally smaller. In the right posterior cerebellum, centrally necrotic lesion previously measured at 9.5 x 8.2 mm measures not 0.5 x 7.8 mm. In the left occipital lobe, lesion previously measured as 5.5 x 3.9 mm today measures 4.8 x 3.4 mm. At the right frontoparietal junction, a cortical lesion previously measured at 4.8 mm in diameter now measures 4 mm in diameter.  None of these are associated with a any new edema.  No pituitary abnormality.  No skull or skullbase lesion.  IMPRESSION: No new or progressive lesion. Three lesions being followed are all very slightly smaller.   Electronically Signed   By: Nelson Chimes M.D.   On: 06/19/2013 11:45    Impression:    The patient is doing well clinically. The patient's brain MRI scan looked very good. We will continue standard followup through the brain program.  Plan:  The patient will undergo a repeat MRI scan of the brain in 3 months.   Jodelle Gross, M.D., Ph.D.

## 2013-06-22 NOTE — Progress Notes (Signed)
Follow up [facility] , MRI brain results from 06/19/13, will have Carbo/Almita tomorrow , takes deacdron as scheduled per chemotherapy, no pain, no nausea, no h/a's, appetite good, energy level good, mows yard weekly 8:14 AM

## 2013-06-23 ENCOUNTER — Other Ambulatory Visit (HOSPITAL_BASED_OUTPATIENT_CLINIC_OR_DEPARTMENT_OTHER): Payer: Medicare Other

## 2013-06-23 ENCOUNTER — Ambulatory Visit (HOSPITAL_BASED_OUTPATIENT_CLINIC_OR_DEPARTMENT_OTHER): Payer: Medicare Other | Admitting: Physician Assistant

## 2013-06-23 ENCOUNTER — Encounter: Payer: Self-pay | Admitting: Physician Assistant

## 2013-06-23 ENCOUNTER — Telehealth: Payer: Self-pay | Admitting: Internal Medicine

## 2013-06-23 ENCOUNTER — Ambulatory Visit (HOSPITAL_BASED_OUTPATIENT_CLINIC_OR_DEPARTMENT_OTHER): Payer: Medicare Other

## 2013-06-23 ENCOUNTER — Telehealth: Payer: Self-pay | Admitting: *Deleted

## 2013-06-23 VITALS — BP 142/74 | HR 70 | Temp 97.7°F | Resp 18 | Ht 71.0 in | Wt 168.1 lb

## 2013-06-23 DIAGNOSIS — C7931 Secondary malignant neoplasm of brain: Secondary | ICD-10-CM

## 2013-06-23 DIAGNOSIS — C3430 Malignant neoplasm of lower lobe, unspecified bronchus or lung: Secondary | ICD-10-CM

## 2013-06-23 DIAGNOSIS — C3490 Malignant neoplasm of unspecified part of unspecified bronchus or lung: Secondary | ICD-10-CM

## 2013-06-23 DIAGNOSIS — C7949 Secondary malignant neoplasm of other parts of nervous system: Secondary | ICD-10-CM

## 2013-06-23 DIAGNOSIS — R5383 Other fatigue: Secondary | ICD-10-CM

## 2013-06-23 DIAGNOSIS — Z5111 Encounter for antineoplastic chemotherapy: Secondary | ICD-10-CM

## 2013-06-23 DIAGNOSIS — R5381 Other malaise: Secondary | ICD-10-CM

## 2013-06-23 LAB — CBC WITH DIFFERENTIAL/PLATELET
BASO%: 0.2 % (ref 0.0–2.0)
Basophils Absolute: 0 10*3/uL (ref 0.0–0.1)
EOS%: 0 % (ref 0.0–7.0)
Eosinophils Absolute: 0 10*3/uL (ref 0.0–0.5)
HCT: 31.3 % — ABNORMAL LOW (ref 38.4–49.9)
HGB: 10.6 g/dL — ABNORMAL LOW (ref 13.0–17.1)
LYMPH%: 6.1 % — ABNORMAL LOW (ref 14.0–49.0)
MCH: 31.2 pg (ref 27.2–33.4)
MCHC: 33.9 g/dL (ref 32.0–36.0)
MCV: 92.1 fL (ref 79.3–98.0)
MONO#: 0.4 10*3/uL (ref 0.1–0.9)
MONO%: 6.6 % (ref 0.0–14.0)
NEUT#: 5.6 10*3/uL (ref 1.5–6.5)
NEUT%: 87.1 % — ABNORMAL HIGH (ref 39.0–75.0)
Platelets: 221 10*3/uL (ref 140–400)
RBC: 3.4 10*6/uL — AB (ref 4.20–5.82)
RDW: 13.4 % (ref 11.0–14.6)
WBC: 6.4 10*3/uL (ref 4.0–10.3)
lymph#: 0.4 10*3/uL — ABNORMAL LOW (ref 0.9–3.3)

## 2013-06-23 LAB — COMPREHENSIVE METABOLIC PANEL (CC13)
ALK PHOS: 159 U/L — AB (ref 40–150)
ALT: 15 U/L (ref 0–55)
AST: 22 U/L (ref 5–34)
Albumin: 3.5 g/dL (ref 3.5–5.0)
Anion Gap: 11 mEq/L (ref 3–11)
BUN: 12.4 mg/dL (ref 7.0–26.0)
CALCIUM: 9.3 mg/dL (ref 8.4–10.4)
CHLORIDE: 107 meq/L (ref 98–109)
CO2: 23 mEq/L (ref 22–29)
Creatinine: 0.8 mg/dL (ref 0.7–1.3)
Glucose: 139 mg/dl (ref 70–140)
POTASSIUM: 3.8 meq/L (ref 3.5–5.1)
SODIUM: 140 meq/L (ref 136–145)
TOTAL PROTEIN: 7 g/dL (ref 6.4–8.3)
Total Bilirubin: 0.51 mg/dL (ref 0.20–1.20)

## 2013-06-23 MED ORDER — ONDANSETRON 8 MG/NS 50 ML IVPB
INTRAVENOUS | Status: AC
  Filled 2013-06-23: qty 8

## 2013-06-23 MED ORDER — SODIUM CHLORIDE 0.9 % IV SOLN
Freq: Once | INTRAVENOUS | Status: AC
  Administered 2013-06-23: 13:00:00 via INTRAVENOUS

## 2013-06-23 MED ORDER — DEXAMETHASONE SODIUM PHOSPHATE 10 MG/ML IJ SOLN
INTRAMUSCULAR | Status: AC
  Filled 2013-06-23: qty 1

## 2013-06-23 MED ORDER — PEMETREXED DISODIUM CHEMO INJECTION 500 MG
500.0000 mg/m2 | Freq: Once | INTRAVENOUS | Status: AC
  Administered 2013-06-23: 975 mg via INTRAVENOUS
  Filled 2013-06-23: qty 39

## 2013-06-23 MED ORDER — DEXAMETHASONE SODIUM PHOSPHATE 10 MG/ML IJ SOLN
10.0000 mg | Freq: Once | INTRAMUSCULAR | Status: AC
  Administered 2013-06-23: 10 mg via INTRAVENOUS

## 2013-06-23 MED ORDER — CYANOCOBALAMIN 1000 MCG/ML IJ SOLN
1000.0000 ug | Freq: Once | INTRAMUSCULAR | Status: AC
  Administered 2013-06-23: 1000 ug via INTRAMUSCULAR

## 2013-06-23 MED ORDER — CYANOCOBALAMIN 1000 MCG/ML IJ SOLN
INTRAMUSCULAR | Status: AC
  Filled 2013-06-23: qty 1

## 2013-06-23 MED ORDER — ONDANSETRON 8 MG/50ML IVPB (CHCC)
8.0000 mg | Freq: Once | INTRAVENOUS | Status: AC
  Administered 2013-06-23: 8 mg via INTRAVENOUS

## 2013-06-23 NOTE — Telephone Encounter (Signed)
Gave pt appt for lab and emailed Sharyn Lull rearding chemo for June and July 2015

## 2013-06-23 NOTE — Telephone Encounter (Signed)
Per staff message and POF I have scheduled appts.  JMW  

## 2013-06-23 NOTE — Progress Notes (Addendum)
[facility] [phone]   [phone]  SHARED VISIT PROGRESS NOTE  [name], MD [place] [address]  DIAGNOSIS: Stage IV ( T2b, N2, M1b) non-small cell lung cancer, adenocarcinoma with areas of squamous differentiation with negative EGFR mutation and negative ALK gene translocation, presented with left lower lobe lung mass in addition to mediastinal lymphadenopathy and brain metastases diagnosed in October of 2014  PRIOR THERAPY: 1) Stereotactic radiotherapy to 3 brain lesions under the care of Dr. Lisbeth Renshaw completed 12/08/2012. 2) Palliative radiotherapy to the left lower lobe lung mass under the care of Dr. Lisbeth Renshaw expected to be completed 01/01/2013. 3) Systemic chemotherapy with carboplatin for AUC of 5 and Alimta 500 mg/M2 every 3 weeks. First dose 12/23/2012. Status post 6 cycles  CURRENT THERAPY: Maintenance chemotherapy with single agent Alimta at 500 mg per meter squared given every 3 weeks. First cycle expected to be given 05/12/2013. Status post 2 cycles.   CHEMOTHERAPY INTENT: Palliative  CURRENT # OF CHEMOTHERAPY CYCLES: 3  CURRENT ANTIEMETICS:Zofran, dexamethasone and Compazine  CURRENT SMOKING STATUS: former smoker  ORAL CHEMOTHERAPY AND CONSENT: None  CURRENT BISPHOSPHONATES USE: None  PAIN MANAGEMENT: 0/10  NARCOTICS INDUCED CONSTIPATION: None  LIVING WILL AND CODE STATUS: ?  INTERVAL HISTORY: [name] 80 y.o. male returns to the clinic today for follow up visit accompanied by his wife. He is tolerating his maintenance treatment relatively well with the exception of some fatigue. He is now status post 2 cycles.The patient denied having any nausea or vomiting. He denied having any fever or chills. He has no chest pain, shortness of breath, cough or hemoptysis. He has no weight loss or night sweats. He is here today to start cycle #3 of his maintenance chemotherapy.  MEDICAL HISTORY: Past Medical  History  Diagnosis Date  . COPD (chronic obstructive pulmonary disease)   . Allergy     allergic rhinitis  . Hx of radiation therapy 12/09/12-01/01/13    lung 37.5Gy  . nscl ca w/ brain mets dx'd 08/2012    Patient has a lung mass which is being evaluated    ALLERGIES:  is allergic to sulfa antibiotics.  MEDICATIONS:  Current Outpatient Prescriptions  Medication Sig Dispense Refill  . acetaminophen (TYLENOL) 500 MG tablet Take 500 mg by mouth every 6 (six) hours as needed.      . clobetasol cream (TEMOVATE) 2.37 % Apply 1 application topically daily as needed (for itching).      Marland Kitchen dexamethasone (DECADRON) 4 MG tablet 4 mg by mouth twice a day the day before, day of and day after the chemotherapy every 3 weeks  40 tablet  1  . ferrous sulfate (IRON SUPPLEMENT) 325 (65 FE) MG tablet Take 325 mg by mouth daily with breakfast.      . folic acid (FOLVITE) 628 MCG tablet Take 0.5 tablets (400 mcg total) by mouth daily.  30 tablet  3  . loratadine (ALLERGY RELIEF) 10 MG tablet Take 10 mg by mouth daily.      Vladimir Faster Glycol-Propyl Glycol (SYSTANE OP) Place 1 drop into both eyes daily.      . prochlorperazine (COMPAZINE) 10 MG tablet Take 1 tablet (10 mg total) by mouth every 6 (six) hours as needed for nausea or vomiting.  60 tablet  0  . simvastatin (ZOCOR) 10 MG tablet Take 1 tablet (10 mg total) by mouth at bedtime.  90 tablet  3   No current  facility-administered medications for this visit.    SURGICAL HISTORY:  Past Surgical History  Procedure Laterality Date  . Cholecystectomy      REVIEW OF SYSTEMS:  Constitutional: positive for fatigue Eyes: negative Ears, nose, mouth, throat, and face: negative Respiratory: negative Cardiovascular: negative Gastrointestinal: negative Genitourinary:negative Integument/breast: negative Hematologic/lymphatic: negative Musculoskeletal:negative Neurological: negative Behavioral/Psych: negative Endocrine: negative Allergic/Immunologic:  negative   PHYSICAL EXAMINATION: General appearance: alert, cooperative and no distress Head: Normocephalic, without obvious abnormality, atraumatic Neck: no adenopathy, no JVD, supple, symmetrical, trachea midline and thyroid not enlarged, symmetric, no tenderness/mass/nodules Lymph nodes: Cervical, supraclavicular, and axillary nodes normal. Resp: clear to auscultation bilaterally Back: symmetric, no curvature. ROM normal. No CVA tenderness. Cardio: regular rate and rhythm, S1, S2 normal, no murmur, click, rub or gallop GI: soft, non-tender; bowel sounds normal; no masses,  no organomegaly Extremities: extremities normal, atraumatic, no cyanosis or edema Neurologic: Alert and oriented X 3, normal strength and tone. Normal symmetric reflexes. Normal coordination and gait  ECOG PERFORMANCE STATUS: 1 - Symptomatic but completely ambulatory  Blood pressure 142/74, pulse 70, temperature 97.7 F (36.5 C), temperature source Oral, resp. rate 18, height $RemoveBe'5\' 11"'VeIcdmJkf$  (1.803 m), weight 168 lb 1.6 oz (76.25 kg), SpO2 100.00%.  LABORATORY DATA: Lab Results  Component Value Date   WBC 6.4 06/23/2013   HGB 10.6* 06/23/2013   HCT 31.3* 06/23/2013   MCV 92.1 06/23/2013   PLT 221 06/23/2013      Chemistry      Component Value Date/Time   NA 140 06/23/2013 1120   NA CANCELED 10/13/2012 1149   NA 138 06/13/2007 0420   K 3.8 06/23/2013 1120   K CANCELED 10/13/2012 1149   CL CANCELED 10/13/2012 1149   CO2 23 06/23/2013 1120   CO2 CANCELED 10/13/2012 1149   BUN 12.4 06/23/2013 1120   BUN CANCELED 10/13/2012 1149   BUN 10 06/13/2007 0420   CREATININE 0.8 06/23/2013 1120   CREATININE 1.00 11/10/2012 1431      Component Value Date/Time   CALCIUM 9.3 06/23/2013 1120   CALCIUM CANCELED 10/13/2012 1149   ALKPHOS 159* 06/23/2013 1120   ALKPHOS CANCELED 10/13/2012 1149   AST 22 06/23/2013 1120   AST CANCELED 10/13/2012 1149   ALT 15 06/23/2013 1120   ALT CANCELED 10/13/2012 1149   BILITOT 0.51 06/23/2013 1120   BILITOT CANCELED  10/13/2012 1149       RADIOGRAPHIC STUDIES:   ASSESSMENT AND PLAN: this is a very pleasant 80 years old white male with metastatic non-small cell lung cancer, adenocarcinoma with brain metastasis status post stereotactic radiotherapy to the brain lesions followed by palliative radiotherapy to the left lower lobe lung mass. The patient is status post 6 cycles of systemic chemotherapy with carboplatin and Alimta with stable disease. He is currently  undergoing maintenance chemotherapy with single agent Alimta, status post 2 cycles. Overall is tolerating his maintenance chemotherapy relatively well with the exception of some fatigue. Patient was discussed with and also seen by Dr. Julien Nordmann. He will proceed with cycle #3 of his maintenance chemotherapy with single agent Alimta at 500 mg per meter squared given every 3 weeks. He will followup with Dr. Julien Nordmann in 3 weeks with a restaging CT scan of the chest, abdomen and pelvis with contrast to reevaluate his disease.  The patient voices understanding of current disease status and treatment options and is in agreement with the current care plan.  All questions were answered. The patient knows to call the clinic with any problems, questions  or concerns. We can certainly see the patient much sooner if necessary.  Carlton Adam PA-C  ADDENDUM: Hematology/Oncology Attending:  I had a face to face encounter with the patient. I recommended his care plan. This is a very pleasant 80 years old white male with metastatic non-small cell lung cancer status post induction chemotherapy with carboplatin and Alimta with stable disease and he is currently undergoing maintenance chemotherapy with single agent Alimta status post 2 cycles. He is tolerating his treatment fairly well with no significant adverse effects. I recommended for the patient to proceed with cycle #3 today as scheduled. He would come back for follow up visit in 3 weeks after repeating CT scan of  the chest, abdomen and pelvis for restaging of his disease. He was advised to call immediately if he has any concerning symptoms in the interval.  Disclaimer: This note was dictated with voice recognition software. Similar sounding words can inadvertently be transcribed and may not be corrected upon review. Eilleen Kempf., MD 06/27/2013

## 2013-06-23 NOTE — Patient Instructions (Signed)
[facility] Discharge Instructions for Patients Receiving Chemotherapy  Today you received the following chemotherapy agents Alimta  To help prevent nausea and vomiting after your treatment, we encourage you to take your nausea medication as prescribed.  If you develop nausea and vomiting that is not controlled by your nausea medication, call the clinic.   BELOW ARE SYMPTOMS THAT SHOULD BE REPORTED IMMEDIATELY:  *FEVER GREATER THAN 100.5 F  *CHILLS WITH OR WITHOUT FEVER  NAUSEA AND VOMITING THAT IS NOT CONTROLLED WITH YOUR NAUSEA MEDICATION  *UNUSUAL SHORTNESS OF BREATH  *UNUSUAL BRUISING OR BLEEDING  TENDERNESS IN MOUTH AND THROAT WITH OR WITHOUT PRESENCE OF ULCERS  *URINARY PROBLEMS  *BOWEL PROBLEMS  UNUSUAL RASH Items with * indicate a potential emergency and should be followed up as soon as possible.  Feel free to call the clinic you have any questions or concerns. The clinic phone number is [phone].

## 2013-06-24 NOTE — Progress Notes (Addendum)
      This NP visited briefly with the patient and his wife in the [facility] to offer continued holistic support from Palliative Medicine Team.  Both patient and his wife are reported to be "doing well" and have no questions or concerns at this time.  They verbalize appreciation for the visit and are encouraged to call with questions or concerns.  Wadie Lessen NP  Palliative Medicine Team Team Phone # [phone] Pager [phone]

## 2013-06-25 ENCOUNTER — Telehealth: Payer: Self-pay | Admitting: Internal Medicine

## 2013-06-25 NOTE — Patient Instructions (Signed)
Continue lab and chemotherapy as scheduled Followup with Dr. Julien Nordmann in 3 weeks with restaging CT scan of the chest, abdomen and pelvis to reevaluate your disease

## 2013-06-25 NOTE — Telephone Encounter (Signed)
Talked to pt's wife and she is aware of all appts

## 2013-06-28 ENCOUNTER — Emergency Department (HOSPITAL_COMMUNITY)
Admission: EM | Admit: 2013-06-28 | Discharge: 2013-06-28 | Disposition: A | Discharge status: Home / Self Care | Payer: Medicare Other | Attending: Emergency Medicine | Admitting: Emergency Medicine

## 2013-06-28 ENCOUNTER — Encounter (HOSPITAL_COMMUNITY): Payer: Self-pay | Admitting: Emergency Medicine

## 2013-06-28 DIAGNOSIS — Z923 Personal history of irradiation: Secondary | ICD-10-CM | POA: Insufficient documentation

## 2013-06-28 DIAGNOSIS — R42 Dizziness and giddiness: Secondary | ICD-10-CM | POA: Insufficient documentation

## 2013-06-28 DIAGNOSIS — Z882 Allergy status to sulfonamides status: Secondary | ICD-10-CM | POA: Insufficient documentation

## 2013-06-28 DIAGNOSIS — Z87891 Personal history of nicotine dependence: Secondary | ICD-10-CM | POA: Insufficient documentation

## 2013-06-28 DIAGNOSIS — IMO0002 Reserved for concepts with insufficient information to code with codable children: Secondary | ICD-10-CM | POA: Insufficient documentation

## 2013-06-28 DIAGNOSIS — C801 Malignant (primary) neoplasm, unspecified: Secondary | ICD-10-CM | POA: Insufficient documentation

## 2013-06-28 DIAGNOSIS — Z79899 Other long term (current) drug therapy: Secondary | ICD-10-CM | POA: Insufficient documentation

## 2013-06-28 DIAGNOSIS — J4489 Other specified chronic obstructive pulmonary disease: Secondary | ICD-10-CM | POA: Insufficient documentation

## 2013-06-28 DIAGNOSIS — R197 Diarrhea, unspecified: Secondary | ICD-10-CM

## 2013-06-28 DIAGNOSIS — R55 Syncope and collapse: Secondary | ICD-10-CM | POA: Insufficient documentation

## 2013-06-28 DIAGNOSIS — J449 Chronic obstructive pulmonary disease, unspecified: Secondary | ICD-10-CM | POA: Insufficient documentation

## 2013-06-28 DIAGNOSIS — R112 Nausea with vomiting, unspecified: Secondary | ICD-10-CM

## 2013-06-28 LAB — URINALYSIS, ROUTINE W REFLEX MICROSCOPIC
Bilirubin Urine: NEGATIVE
Glucose, UA: NEGATIVE mg/dL
Hgb urine dipstick: NEGATIVE
Ketones, ur: NEGATIVE mg/dL
Leukocytes, UA: NEGATIVE
Nitrite: NEGATIVE
Protein, ur: NEGATIVE mg/dL
Specific Gravity, Urine: 1.01 (ref 1.005–1.030)
Urobilinogen, UA: 0.2 mg/dL (ref 0.0–1.0)
pH: 6.5 (ref 5.0–8.0)

## 2013-06-28 LAB — CBC WITH DIFFERENTIAL/PLATELET
Basophils Absolute: 0 10*3/uL (ref 0.0–0.1)
Basophils Relative: 0 % (ref 0–1)
Eosinophils Absolute: 0.1 10*3/uL (ref 0.0–0.7)
Eosinophils Relative: 2 % (ref 0–5)
HCT: 33.3 % — ABNORMAL LOW (ref 39.0–52.0)
Hemoglobin: 11.7 g/dL — ABNORMAL LOW (ref 13.0–17.0)
Lymphocytes Relative: 8 % — ABNORMAL LOW (ref 12–46)
Lymphs Abs: 0.4 10*3/uL — ABNORMAL LOW (ref 0.7–4.0)
MCH: 31.6 pg (ref 26.0–34.0)
MCHC: 35.1 g/dL (ref 30.0–36.0)
MCV: 90 fL (ref 78.0–100.0)
Monocytes Absolute: 0.1 10*3/uL (ref 0.1–1.0)
Monocytes Relative: 1 % — ABNORMAL LOW (ref 3–12)
Neutro Abs: 4.4 10*3/uL (ref 1.7–7.7)
Neutrophils Relative %: 89 % — ABNORMAL HIGH (ref 43–77)
Platelets: 170 10*3/uL (ref 150–400)
RBC: 3.7 MIL/uL — ABNORMAL LOW (ref 4.22–5.81)
RDW: 13.3 % (ref 11.5–15.5)
WBC: 5 10*3/uL (ref 4.0–10.5)

## 2013-06-28 LAB — BASIC METABOLIC PANEL
BUN: 15 mg/dL (ref 6–23)
CO2: 29 mEq/L (ref 19–32)
Calcium: 8.9 mg/dL (ref 8.4–10.5)
Chloride: 94 mEq/L — ABNORMAL LOW (ref 96–112)
Creatinine, Ser: 0.71 mg/dL (ref 0.50–1.35)
GFR calc Af Amer: >90 mL/min (ref >90)
GFR calc non Af Amer: 88 mL/min — ABNORMAL LOW (ref >90)
Glucose, Bld: 108 mg/dL — ABNORMAL HIGH (ref 70–99)
Potassium: 3.6 mEq/L — ABNORMAL LOW (ref 3.7–5.3)
Sodium: 135 mEq/L — ABNORMAL LOW (ref 137–147)

## 2013-06-28 MED ORDER — ONDANSETRON HCL 4 MG/2ML IJ SOLN
4.0000 mg | Freq: Once | INTRAMUSCULAR | Status: AC
  Administered 2013-06-28: 4 mg via INTRAMUSCULAR
  Filled 2013-06-28: qty 2

## 2013-06-28 MED ORDER — ONDANSETRON HCL 4 MG PO TABS: 4.0000 mg | ORAL_TABLET | Freq: Four times a day (QID) | ORAL | Status: DC

## 2013-06-28 MED ORDER — SODIUM CHLORIDE 0.9 % IV BOLUS (SEPSIS)
1000.0000 mL | Freq: Once | INTRAVENOUS | Status: AC
  Administered 2013-06-28: 1000 mL via INTRAVENOUS

## 2013-06-28 NOTE — Discharge Instructions (Signed)
Diarrhea °Diarrhea is frequent loose and watery bowel movements. It can cause you to feel weak and dehydrated. Dehydration can cause you to become tired and thirsty, have a dry mouth, and have decreased urination that often is dark yellow. Diarrhea is a sign of another problem, most often an infection that will not last long. In most cases, diarrhea typically lasts 2 3 days. However, it can last longer if it is a sign of something more serious. It is important to treat your diarrhea as directed by your caregive to lessen or prevent future episodes of diarrhea. °CAUSES  °Some common causes include: °· Gastrointestinal infections caused by viruses, bacteria, or parasites. °· Food poisoning or food allergies. °· Certain medicines, such as antibiotics, chemotherapy, and laxatives. °· Artificial sweeteners and fructose. °· Digestive disorders. °HOME CARE INSTRUCTIONS °· Ensure adequate fluid intake (hydration): have 1 cup (8 oz) of fluid for each diarrhea episode. Avoid fluids that contain simple sugars or sports drinks, fruit juices, whole milk products, and sodas. Your urine should be clear or pale yellow if you are drinking enough fluids. Hydrate with an oral rehydration solution that you can purchase at pharmacies, retail stores, and online. You can prepare an oral rehydration solution at home by mixing the following ingredients together: °·   tsp table salt. °· ¾ tsp baking soda. °·  tsp salt substitute containing potassium chloride. °· 1  tablespoons sugar. °· 1 L (34 oz) of water. °· Certain foods and beverages may increase the speed at which food moves through the gastrointestinal (GI) tract. These foods and beverages should be avoided and include: °· Caffeinated and alcoholic beverages. °· High-fiber foods, such as raw fruits and vegetables, nuts, seeds, and whole grain breads and cereals. °· Foods and beverages sweetened with sugar alcohols, such as xylitol, sorbitol, and mannitol. °· Some foods may be well  tolerated and may help thicken stool including: °· Starchy foods, such as rice, toast, pasta, low-sugar cereal, oatmeal, grits, baked potatoes, crackers, and bagels. °· Bananas. °· Applesauce. °· Add probiotic-rich foods to help increase healthy bacteria in the GI tract, such as yogurt and fermented milk products. °· Wash your hands well after each diarrhea episode. °· Only take over-the-counter or prescription medicines as directed by your caregiver. °· Take a warm bath to relieve any burning or pain from frequent diarrhea episodes. °SEEK IMMEDIATE MEDICAL CARE IF:  °· You are unable to keep fluids down. °· You have persistent vomiting. °· You have blood in your stool, or your stools are black and tarry. °· You do not urinate in 6 8 hours, or there is only a small amount of very dark urine. °· You have abdominal pain that increases or localizes. °· You have weakness, dizziness, confusion, or lightheadedness. °· You have a severe headache. °· Your diarrhea gets worse or does not get better. °· You have a fever or persistent symptoms for more than 2 3 days. °· You have a fever and your symptoms suddenly get worse. °MAKE SURE YOU:  °· Understand these instructions. °· Will watch your condition. °· Will get help right away if you are not doing well or get worse. °Document Released: 12/22/2001 Document Revised: 12/19/2011 Document Reviewed: 09/09/2011 °Exit[employer]® Patient Information ©2014 [employer]. ° °Nausea and Vomiting °Nausea is a sick feeling that often comes before throwing up (vomiting). Vomiting is a reflex where stomach contents come out of your mouth. Vomiting can cause severe loss of body fluids (dehydration). Children and elderly adults can become   dehydrated quickly, especially if they also have diarrhea. Nausea and vomiting are symptoms of a condition or disease. It is important to find the cause of your symptoms. °CAUSES  °· Direct irritation of the stomach lining. This irritation can result from  increased acid production (gastroesophageal reflux disease), infection, food poisoning, taking certain medicines (such as nonsteroidal anti-inflammatory drugs), alcohol use, or tobacco use. °· Signals from the brain. These signals could be caused by a headache, heat exposure, an inner ear disturbance, increased pressure in the brain from injury, infection, a tumor, or a concussion, pain, emotional stimulus, or metabolic problems. °· An obstruction in the gastrointestinal tract (bowel obstruction). °· Illnesses such as diabetes, hepatitis, gallbladder problems, appendicitis, kidney problems, cancer, sepsis, atypical symptoms of a heart attack, or eating disorders. °· Medical treatments such as chemotherapy and radiation. °· Receiving medicine that makes you sleep (general anesthetic) during surgery. °DIAGNOSIS °Your caregiver may ask for tests to be done if the problems do not improve after a few days. Tests may also be done if symptoms are severe or if the reason for the nausea and vomiting is not clear. Tests may include: °· Urine tests. °· Blood tests. °· Stool tests. °· Cultures (to look for evidence of infection). °· X-rays or other imaging studies. °Test results can help your caregiver make decisions about treatment or the need for additional tests. °TREATMENT °You need to stay well hydrated. Drink frequently but in small amounts. You may wish to drink water, sports drinks, clear broth, or eat frozen ice pops or gelatin dessert to help stay hydrated. When you eat, eating slowly may help prevent nausea. There are also some antinausea medicines that may help prevent nausea. °HOME CARE INSTRUCTIONS  °· Take all medicine as directed by your caregiver. °· If you do not have an appetite, do not force yourself to eat. However, you must continue to drink fluids. °· If you have an appetite, eat a normal diet unless your caregiver tells you differently. °· Eat a variety of complex carbohydrates (rice, wheat, potatoes,  bread), lean meats, yogurt, fruits, and vegetables. °· Avoid high-fat foods because they are more difficult to digest. °· Drink enough water and fluids to keep your urine clear or pale yellow. °· If you are dehydrated, ask your caregiver for specific rehydration instructions. Signs of dehydration may include: °· Severe thirst. °· Dry lips and mouth. °· Dizziness. °· Dark urine. °· Decreasing urine frequency and amount. °· Confusion. °· Rapid breathing or pulse. °SEEK IMMEDIATE MEDICAL CARE IF:  °· You have blood or brown flecks (like coffee grounds) in your vomit. °· You have black or bloody stools. °· You have a severe headache or stiff neck. °· You are confused. °· You have severe abdominal pain. °· You have chest pain or trouble breathing. °· You do not urinate at least once every 8 hours. °· You develop cold or clammy skin. °· You continue to vomit for longer than 24 to 48 hours. °· You have a fever. °MAKE SURE YOU:  °· Understand these instructions. °· Will watch your condition. °· Will get help right away if you are not doing well or get worse. °Document Released: 01/01/2005 Document Revised: 03/26/2011 Document Reviewed: 05/31/2010 °ExitCare® Patient Information ©2014 [employer]. ° °

## 2013-06-28 NOTE — ED Notes (Signed)
Diarrhea since Thursday,  Vomiting since yesterday.  Pt received chemo Tuesday.

## 2013-06-28 NOTE — ED Provider Notes (Signed)
CSN: [id number]     Arrival date & time 06/28/13  1123 History  This chart was scribed for Preston Manifold, MD by Marlowe Kays, ED Scribe. This patient was seen in room APA02/APA02 and the patient's care was started at 11:48 AM.  Chief Complaint  Patient presents with  . Diarrhea  . Emesis   The history is provided by the patient. No language interpreter was used.   HPI Comments:  [name] is a 80 y.o. male with h/o COPD and lung cancer who presents to the Emergency Department complaining of diarrhea onset three days ago and emesis onset one day. Pt reports dizziness and a syncopal episode two days ago. Pt states his last chemotherapy treatment was received five days ago. He states he has taken Compazine and Immodium with moderate relief. Pt denies any sick contacts. He denies fever, pain, hematemesis, or hematochezia  Past Medical History  Diagnosis Date  . COPD (chronic obstructive pulmonary disease)   . Allergy     allergic rhinitis  . Hx of radiation therapy 12/09/12-01/01/13    lung 37.5Gy  . nscl ca w/ brain mets dx'd 08/2012    Patient has a lung mass which is being evaluated   Past Surgical History  Procedure Laterality Date  . Cholecystectomy     Family History  Problem Relation Age of Onset  . Cancer Mother     breast  . Stroke Father    History  Substance Use Topics  . Smoking status: Former Smoker    Quit date: 09/11/1998  . Smokeless tobacco: Never Used  . Alcohol Use: No    Review of Systems  Constitutional: Negative for fever.  Gastrointestinal: Positive for vomiting and diarrhea. Negative for blood in stool.  All other systems reviewed and are negative.   Allergies  Sulfa antibiotics  Home Medications   Prior to Admission medications   Medication Sig Start Date End Date Taking? Authorizing Provider  acetaminophen (TYLENOL) 500 MG tablet Take 500 mg by mouth every 6 (six) hours as needed for mild pain.    Yes Historical Provider, MD  ferrous  sulfate (IRON SUPPLEMENT) 325 (65 FE) MG tablet Take 325 mg by mouth daily with breakfast.   Yes Historical Provider, MD  folic acid (FOLVITE) 301 MCG tablet Take 0.5 tablets (400 mcg total) by mouth daily. 12/19/12  Yes Curt Bears, MD  Polyethyl Glycol-Propyl Glycol (SYSTANE OP) Place 1 drop into both eyes daily.   Yes Historical Provider, MD  prochlorperazine (COMPAZINE) 10 MG tablet Take 1 tablet (10 mg total) by mouth every 6 (six) hours as needed for nausea or vomiting. 12/19/12  Yes Curt Bears, MD  simvastatin (ZOCOR) 10 MG tablet Take 1 tablet (10 mg total) by mouth at bedtime. 09/08/12  Yes Lysbeth Penner, FNP  clobetasol cream (TEMOVATE) 6.01 % Apply 1 application topically daily as needed (for itching).    Historical Provider, MD  dexamethasone (DECADRON) 4 MG tablet 4 mg by mouth twice a day the day before, day of and day after the chemotherapy every 3 weeks 12/19/12   Curt Bears, MD  loratadine (ALLERGY RELIEF) 10 MG tablet Take 10 mg by mouth daily as needed for allergies.     Historical Provider, MD   Triage Vitals: BP 134/84  Pulse 96  Temp(Src) 97.8 F (36.6 C) (Oral)  Resp 18  Ht 5\' 11"  (1.803 m)  Wt 160 lb (72.576 kg)  BMI 22.33 kg/m2  SpO2 97% Physical Exam  Nursing note  and vitals reviewed. Constitutional: He is oriented to person, place, and time. He appears well-developed and well-nourished.  HENT:  Head: Normocephalic and atraumatic.  Eyes: EOM are normal.  Neck: Normal range of motion.  Cardiovascular: Normal rate, regular rhythm and normal heart sounds.  Exam reveals no gallop and no friction rub.   No murmur heard. Pulmonary/Chest: Effort normal and breath sounds normal. No respiratory distress. He has no wheezes. He has no rales.  Abdominal: Soft. He exhibits no distension. There is no tenderness. There is no rebound and no guarding.  Musculoskeletal: Normal range of motion.  Neurological: He is alert and oriented to person, place, and time.   Skin: Skin is warm and dry.  Psychiatric: He has a normal mood and affect. His behavior is normal.    ED Course  Procedures (including critical care time) DIAGNOSTIC STUDIES: Oxygen Saturation is 97% on RA, normal by my interpretation.   COORDINATION OF CARE: 11:51 AM- Will order IV fluids and zofran . Pt verbalizes understanding and agrees to plan.  Medications  sodium chloride 0.9 % bolus 1,000 mL (1,000 mLs Intravenous New Bag/Given 06/28/13 1209)  ondansetron (ZOFRAN) injection 4 mg (4 mg Intramuscular Given 06/28/13 1211)    Labs Review Labs Reviewed  BASIC METABOLIC PANEL - Abnormal; Notable for the following:    Sodium 135 (*)    Potassium 3.6 (*)    Chloride 94 (*)    Glucose, Bld 108 (*)    GFR calc non Af Amer 88 (*)    All other components within normal limits  CBC WITH DIFFERENTIAL - Abnormal; Notable for the following:    RBC 3.70 (*)    Hemoglobin 11.7 (*)    HCT 33.3 (*)    Neutrophils Relative % 89 (*)    Lymphocytes Relative 8 (*)    Lymphs Abs 0.4 (*)    Monocytes Relative 1 (*)    All other components within normal limits  URINALYSIS, ROUTINE W REFLEX MICROSCOPIC    Imaging Review No results found.   EKG Interpretation None      MDM   Final diagnoses:  Nausea vomiting and diarrhea    Symptoms much improved with IVF/zofran. Appears well. Abdomen benign. Afebrile. Very low suspicion for emergent process. Continued symptomatic tx. Return precautions discussed.   I personally performed the services described in this documentation, which was scribed in my presence. The recorded information has been reviewed and is accurate.    Preston Manifold, MD 07/03/13 [phone]

## 2013-07-10 ENCOUNTER — Ambulatory Visit (HOSPITAL_COMMUNITY)
Admission: RE | Admit: 2013-07-10 | Discharge: 2013-07-10 | Disposition: A | Discharge status: Home / Self Care | Payer: Medicare Other | Source: Ambulatory Visit | Attending: Physician Assistant | Admitting: Physician Assistant

## 2013-07-10 ENCOUNTER — Encounter (HOSPITAL_COMMUNITY): Payer: Self-pay

## 2013-07-10 DIAGNOSIS — Z9221 Personal history of antineoplastic chemotherapy: Secondary | ICD-10-CM | POA: Insufficient documentation

## 2013-07-10 DIAGNOSIS — J90 Pleural effusion, not elsewhere classified: Secondary | ICD-10-CM | POA: Insufficient documentation

## 2013-07-10 DIAGNOSIS — C3490 Malignant neoplasm of unspecified part of unspecified bronchus or lung: Secondary | ICD-10-CM | POA: Insufficient documentation

## 2013-07-10 DIAGNOSIS — J984 Other disorders of lung: Secondary | ICD-10-CM | POA: Insufficient documentation

## 2013-07-10 DIAGNOSIS — J479 Bronchiectasis, uncomplicated: Secondary | ICD-10-CM | POA: Insufficient documentation

## 2013-07-10 DIAGNOSIS — K5730 Diverticulosis of large intestine without perforation or abscess without bleeding: Secondary | ICD-10-CM | POA: Insufficient documentation

## 2013-07-10 DIAGNOSIS — C7949 Secondary malignant neoplasm of other parts of nervous system: Secondary | ICD-10-CM

## 2013-07-10 DIAGNOSIS — C7931 Secondary malignant neoplasm of brain: Secondary | ICD-10-CM | POA: Insufficient documentation

## 2013-07-10 DIAGNOSIS — Z9089 Acquired absence of other organs: Secondary | ICD-10-CM | POA: Insufficient documentation

## 2013-07-10 MED ORDER — IOHEXOL 300 MG/ML  SOLN
100.0000 mL | Freq: Once | INTRAMUSCULAR | Status: AC | PRN
  Administered 2013-07-10: 100 mL via INTRAVENOUS

## 2013-07-14 ENCOUNTER — Telehealth: Payer: Self-pay | Admitting: *Deleted

## 2013-07-14 ENCOUNTER — Telehealth: Payer: Self-pay | Admitting: Internal Medicine

## 2013-07-14 ENCOUNTER — Encounter: Payer: Self-pay | Admitting: Physician Assistant

## 2013-07-14 ENCOUNTER — Other Ambulatory Visit (HOSPITAL_BASED_OUTPATIENT_CLINIC_OR_DEPARTMENT_OTHER): Payer: Medicare Other

## 2013-07-14 ENCOUNTER — Ambulatory Visit (HOSPITAL_BASED_OUTPATIENT_CLINIC_OR_DEPARTMENT_OTHER): Payer: Medicare Other | Admitting: Physician Assistant

## 2013-07-14 ENCOUNTER — Ambulatory Visit (HOSPITAL_BASED_OUTPATIENT_CLINIC_OR_DEPARTMENT_OTHER): Payer: Medicare Other

## 2013-07-14 VITALS — BP 126/70 | HR 76 | Temp 97.6°F | Resp 18 | Ht 71.0 in | Wt 164.4 lb

## 2013-07-14 DIAGNOSIS — C7931 Secondary malignant neoplasm of brain: Secondary | ICD-10-CM

## 2013-07-14 DIAGNOSIS — Z5111 Encounter for antineoplastic chemotherapy: Secondary | ICD-10-CM

## 2013-07-14 DIAGNOSIS — C3430 Malignant neoplasm of lower lobe, unspecified bronchus or lung: Secondary | ICD-10-CM

## 2013-07-14 DIAGNOSIS — C3490 Malignant neoplasm of unspecified part of unspecified bronchus or lung: Secondary | ICD-10-CM

## 2013-07-14 DIAGNOSIS — C7949 Secondary malignant neoplasm of other parts of nervous system: Secondary | ICD-10-CM

## 2013-07-14 DIAGNOSIS — C3431 Malignant neoplasm of lower lobe, right bronchus or lung: Secondary | ICD-10-CM

## 2013-07-14 LAB — CBC WITH DIFFERENTIAL/PLATELET
BASO%: 0.1 % (ref 0.0–2.0)
Basophils Absolute: 0 10*3/uL (ref 0.0–0.1)
EOS%: 0 % (ref 0.0–7.0)
Eosinophils Absolute: 0 10*3/uL (ref 0.0–0.5)
HCT: 30.1 % — ABNORMAL LOW (ref 38.4–49.9)
HGB: 10 g/dL — ABNORMAL LOW (ref 13.0–17.1)
LYMPH%: 6.3 % — AB (ref 14.0–49.0)
MCH: 30.8 pg (ref 27.2–33.4)
MCHC: 33 g/dL (ref 32.0–36.0)
MCV: 93.3 fL (ref 79.3–98.0)
MONO#: 0.5 10*3/uL (ref 0.1–0.9)
MONO%: 7.1 % (ref 0.0–14.0)
NEUT#: 5.5 10*3/uL (ref 1.5–6.5)
NEUT%: 86.5 % — ABNORMAL HIGH (ref 39.0–75.0)
Platelets: 351 10*3/uL (ref 140–400)
RBC: 3.23 10*6/uL — AB (ref 4.20–5.82)
RDW: 14.7 % — ABNORMAL HIGH (ref 11.0–14.6)
WBC: 6.4 10*3/uL (ref 4.0–10.3)
lymph#: 0.4 10*3/uL — ABNORMAL LOW (ref 0.9–3.3)

## 2013-07-14 LAB — COMPREHENSIVE METABOLIC PANEL (CC13)
ALT: 17 U/L (ref 0–55)
AST: 25 U/L (ref 5–34)
Albumin: 3.3 g/dL — ABNORMAL LOW (ref 3.5–5.0)
Alkaline Phosphatase: 169 U/L — ABNORMAL HIGH (ref 40–150)
Anion Gap: 11 mEq/L (ref 3–11)
BILIRUBIN TOTAL: 0.34 mg/dL (ref 0.20–1.20)
BUN: 14.7 mg/dL (ref 7.0–26.0)
CO2: 23 mEq/L (ref 22–29)
Calcium: 9.3 mg/dL (ref 8.4–10.4)
Chloride: 106 mEq/L (ref 98–109)
Creatinine: 0.8 mg/dL (ref 0.7–1.3)
Glucose: 128 mg/dl (ref 70–140)
Potassium: 4 mEq/L (ref 3.5–5.1)
SODIUM: 140 meq/L (ref 136–145)
Total Protein: 6.6 g/dL (ref 6.4–8.3)

## 2013-07-14 MED ORDER — SODIUM CHLORIDE 0.9 % IV SOLN
500.0000 mg/m2 | Freq: Once | INTRAVENOUS | Status: AC
  Administered 2013-07-14: 975 mg via INTRAVENOUS
  Filled 2013-07-14: qty 39

## 2013-07-14 MED ORDER — DEXAMETHASONE SODIUM PHOSPHATE 10 MG/ML IJ SOLN
10.0000 mg | Freq: Once | INTRAMUSCULAR | Status: AC
  Administered 2013-07-14: 10 mg via INTRAVENOUS

## 2013-07-14 MED ORDER — ONDANSETRON 8 MG/NS 50 ML IVPB
INTRAVENOUS | Status: AC
  Filled 2013-07-14: qty 8

## 2013-07-14 MED ORDER — ONDANSETRON 8 MG/50ML IVPB (CHCC)
8.0000 mg | Freq: Once | INTRAVENOUS | Status: AC
  Administered 2013-07-14: 8 mg via INTRAVENOUS

## 2013-07-14 MED ORDER — DEXAMETHASONE SODIUM PHOSPHATE 10 MG/ML IJ SOLN
INTRAMUSCULAR | Status: AC
  Filled 2013-07-14: qty 1

## 2013-07-14 MED ORDER — SODIUM CHLORIDE 0.9 % IV SOLN
Freq: Once | INTRAVENOUS | Status: AC
  Administered 2013-07-14: 14:00:00 via INTRAVENOUS

## 2013-07-14 NOTE — Progress Notes (Addendum)
[facility] [phone]   [phone]  SHARED VISIT PROGRESS NOTE  [name], MD [place] [address]  DIAGNOSIS: Stage IV ( T2b, N2, M1b) non-small cell lung cancer, adenocarcinoma with areas of squamous differentiation with negative EGFR mutation and negative ALK gene translocation, presented with left lower lobe lung mass in addition to mediastinal lymphadenopathy and brain metastases diagnosed in October of 2014  PRIOR THERAPY: 1) Stereotactic radiotherapy to 3 brain lesions under the care of Dr. Lisbeth Renshaw completed 12/08/2012. 2) Palliative radiotherapy to the left lower lobe lung mass under the care of Dr. Lisbeth Renshaw expected to be completed 01/01/2013. 3) Systemic chemotherapy with carboplatin for AUC of 5 and Alimta 500 mg/M2 every 3 weeks. First dose 12/23/2012. Status post 6 cycles  CURRENT THERAPY: Maintenance chemotherapy with single agent Alimta at 500 mg per meter squared given every 3 weeks. First cycle expected to be given 05/12/2013. Status post 3 cycles.   CHEMOTHERAPY INTENT: Palliative  CURRENT # OF CHEMOTHERAPY CYCLES: 4  CURRENT ANTIEMETICS:Zofran, dexamethasone and Compazine  CURRENT SMOKING STATUS: former smoker  ORAL CHEMOTHERAPY AND CONSENT: None  CURRENT BISPHOSPHONATES USE: None  PAIN MANAGEMENT: 0/10  NARCOTICS INDUCED CONSTIPATION: None  LIVING WILL AND CODE STATUS: ?  INTERVAL HISTORY: [name] 80 y.o. male returns to the clinic today for follow up visit accompanied by his wife. He is tolerating his maintenance treatment relatively well with the exception of some fatigue. He is now status post 3 cycles. He reports he was in the emergency room on 06/28/2013  Because of nausea, vomiting, diarrhea and a syncopal episode. He has had no further episodes of these symptoms since that ER visit.The patient denied having any current nausea or vomiting. He denied having any fever or chills. He has no  chest pain, shortness of breath, cough or hemoptysis. He has no weight loss or night sweats. He is here today to start cycle #4 of his maintenance chemotherapy. He also recently had a restaging CT scan and is here to discuss the results.  MEDICAL HISTORY: Past Medical History  Diagnosis Date  . COPD (chronic obstructive pulmonary disease)   . Allergy     allergic rhinitis  . Hx of radiation therapy 12/09/12-01/01/13    lung 37.5Gy  . nscl ca w/ brain mets dx'd 08/2012    Patient has a lung mass which is being evaluated    ALLERGIES:  is allergic to sulfa antibiotics.  MEDICATIONS:  Current Outpatient Prescriptions  Medication Sig Dispense Refill  . acetaminophen (TYLENOL) 500 MG tablet Take 500 mg by mouth every 6 (six) hours as needed for mild pain.       . clobetasol cream (TEMOVATE) 8.36 % Apply 1 application topically daily as needed (for itching).      Marland Kitchen dexamethasone (DECADRON) 4 MG tablet 4 mg by mouth twice a day the day before, day of and day after the chemotherapy every 3 weeks  40 tablet  1  . ferrous sulfate (IRON SUPPLEMENT) 325 (65 FE) MG tablet Take 325 mg by mouth daily with breakfast.      . folic acid (FOLVITE) 629 MCG tablet Take 0.5 tablets (400 mcg total) by mouth daily.  30 tablet  3  . loratadine (ALLERGY RELIEF) 10 MG tablet Take 10 mg by mouth daily as needed for allergies.       Marland Kitchen ondansetron (ZOFRAN) 4 MG tablet Take 1 tablet (4 mg total) by mouth every  6 (six) hours.  20 tablet  0  . Polyethyl Glycol-Propyl Glycol (SYSTANE OP) Place 1 drop into both eyes daily.      . simvastatin (ZOCOR) 10 MG tablet Take 1 tablet (10 mg total) by mouth at bedtime.  90 tablet  3  . prochlorperazine (COMPAZINE) 10 MG tablet Take 1 tablet (10 mg total) by mouth every 6 (six) hours as needed for nausea or vomiting.  60 tablet  0   No current facility-administered medications for this visit.    SURGICAL HISTORY:  Past Surgical History  Procedure Laterality Date  .  Cholecystectomy      REVIEW OF SYSTEMS:  Constitutional: positive for fatigue Eyes: negative Ears, nose, mouth, throat, and face: negative Respiratory: negative Cardiovascular: negative Gastrointestinal: negative Genitourinary:negative Integument/breast: negative Hematologic/lymphatic: negative Musculoskeletal:negative Neurological: negative Behavioral/Psych: negative Endocrine: negative Allergic/Immunologic: negative   PHYSICAL EXAMINATION: General appearance: alert, cooperative and no distress Head: Normocephalic, without obvious abnormality, atraumatic Neck: no adenopathy, no JVD, supple, symmetrical, trachea midline and thyroid not enlarged, symmetric, no tenderness/mass/nodules Lymph nodes: Cervical, supraclavicular, and axillary nodes normal. Resp: clear to auscultation bilaterally Back: symmetric, no curvature. ROM normal. No CVA tenderness. Cardio: regular rate and rhythm, S1, S2 normal, no murmur, click, rub or gallop GI: soft, non-tender; bowel sounds normal; no masses,  no organomegaly Extremities: extremities normal, atraumatic, no cyanosis or edema Neurologic: Alert and oriented X 3, normal strength and tone. Normal symmetric reflexes. Normal coordination and gait  ECOG PERFORMANCE STATUS: 1 - Symptomatic but completely ambulatory  Blood pressure 126/70, pulse 76, temperature 97.6 F (36.4 C), temperature source Oral, resp. rate 18, height $RemoveBe'5\' 11"'HVcsZvRIY$  (1.803 m), weight 164 lb 6.4 oz (74.571 kg), SpO2 99.00%.  LABORATORY DATA: Lab Results  Component Value Date   WBC 6.4 07/14/2013   HGB 10.0* 07/14/2013   HCT 30.1* 07/14/2013   MCV 93.3 07/14/2013   PLT 351 07/14/2013      Chemistry      Component Value Date/Time   NA 140 07/14/2013 1143   NA 135* 06/28/2013 1150   NA CANCELED 10/13/2012 1149   K 4.0 07/14/2013 1143   K 3.6* 06/28/2013 1150   CL 94* 06/28/2013 1150   CO2 23 07/14/2013 1143   CO2 29 06/28/2013 1150   BUN 14.7 07/14/2013 1143   BUN 15 06/28/2013 1150    BUN CANCELED 10/13/2012 1149   CREATININE 0.8 07/14/2013 1143   CREATININE 0.71 06/28/2013 1150      Component Value Date/Time   CALCIUM 9.3 07/14/2013 1143   CALCIUM 8.9 06/28/2013 1150   ALKPHOS 169* 07/14/2013 1143   ALKPHOS CANCELED 10/13/2012 1149   AST 25 07/14/2013 1143   AST CANCELED 10/13/2012 1149   ALT 17 07/14/2013 1143   ALT CANCELED 10/13/2012 1149   BILITOT 0.34 07/14/2013 1143   BILITOT CANCELED 10/13/2012 1149       RADIOGRAPHIC STUDIES: Ct Chest W Contrast  07/10/2013   CLINICAL DATA:  Non-small cell lung cancer with brain metastasis. Diagnosis 2004. Chemotherapy ongoing.  EXAM: CT CHEST, ABDOMEN, AND PELVIS WITH CONTRAST  TECHNIQUE: Multidetector CT imaging of the chest, abdomen and pelvis was performed following the standard protocol during bolus administration of intravenous contrast.  CONTRAST:  146mL OMNIPAQUE IOHEXOL 300 MG/ML  SOLN  COMPARISON:  CT 04/23/2013, PET-CT 11/04/2012  FINDINGS: CT CHEST FINDINGS  No axillary or supraclavicular lymphadenopathy. No mediastinal hilar lymphadenopathy.  Cavitary lesion in the superior segment of the right lower lobe is again demonstrated measuring 25 x 40  compared to 28 x 45 mm on prior. There is peribronchial thickening along the bronchus intermedius and right lower lobe bronchus with bronchiectasis unchanged from prior. No new nodularity. The small right pleural effusion slightly increased. There is new a thickening superior to a horizontal fissure just above the lesion (image 66, coronal series). This may relate to the increased pleural fluid.  CT ABDOMEN AND PELVIS FINDINGS  No focal hepatic lesion. The gallbladder surgically absent. The pancreas, spleen, adrenal glands, kidneys normal.  The stomach, small bowel, cecum normal. Mild diverticulosis the sigmoid colon noted.  Abdominal aorta is normal caliber with intimal calcification. No retroperitoneal periportal lymphadenopathy.  No free fluid the pelvis. No pelvic lymphadenopathy. Prostate  gland and bladder normal. No aggressive osseous lesion.  IMPRESSION: 1. Mild contraction of the cavitary lesion in the superior segment right lower lobe. 2. Posttherapy change at the right hilum. Increased pleural thickening along the fissure just superior to the lesion. This may in part relate to mild increase in right pleural effusion. 3. No evidence of metastasis in the abdomen or pelvis.   Electronically Signed   By: Genevive Bi M.D.   On: 07/10/2013 14:01   Mr Laqueta Jean ZO Contrast  06/19/2013   CLINICAL DATA:  [name] six-month restaging. Metastatic lung cancer.  EXAM: MRI HEAD WITHOUT AND WITH CONTRAST  TECHNIQUE: Multiplanar, multiecho pulse sequences of the brain and surrounding structures were obtained without and with intravenous contrast.  CONTRAST:  36mL MULTIHANCE GADOBENATE DIMEGLUMINE 529 MG/ML IV SOLN  COMPARISON:  03/13/2013.  12/02/2012.  FINDINGS: Diffusion imaging does not show any acute or subacute infarction. There are chronic small-vessel changes of the white matter. No cortical infarction. No hydrocephalus or extra-axial collection.  There are no new or progressive lesions. Three metastatic lesions being followed are all very minimally smaller. In the right posterior cerebellum, centrally necrotic lesion previously measured at 9.5 x 8.2 mm measures not 0.5 x 7.8 mm. In the left occipital lobe, lesion previously measured as 5.5 x 3.9 mm today measures 4.8 x 3.4 mm. At the right frontoparietal junction, a cortical lesion previously measured at 4.8 mm in diameter now measures 4 mm in diameter. None of these are associated with a any new edema.  No pituitary abnormality.  No skull or skullbase lesion.  IMPRESSION: No new or progressive lesion. Three lesions being followed are all very slightly smaller.   Electronically Signed   By: Paulina Fusi M.D.   On: 06/19/2013 11:45   Ct Abdomen Pelvis W Contrast  07/10/2013   CLINICAL DATA:  Non-small cell lung cancer with brain metastasis.  Diagnosis 2004. Chemotherapy ongoing.  EXAM: CT CHEST, ABDOMEN, AND PELVIS WITH CONTRAST  TECHNIQUE: Multidetector CT imaging of the chest, abdomen and pelvis was performed following the standard protocol during bolus administration of intravenous contrast.  CONTRAST:  OMNIPAQUE IOHEXOL 300 MG/ML  SOLN  COMPARISON:  CT 04/23/2013, PET-CT 11/04/2012  FINDINGS: CT CHEST FINDINGS  No axillary or supraclavicular lymphadenopathy. No mediastinal hilar lymphadenopathy.  Cavitary lesion in the superior segment of the right lower lobe is again demonstrated measuring 25 x 40 compared to 28 x 45 mm on prior. There is peribronchial thickening along the bronchus intermedius and right lower lobe bronchus with bronchiectasis unchanged from prior. No new nodularity. The small right pleural effusion slightly increased. There is new a thickening superior to a horizontal fissure just above the lesion (image 66, coronal series). This may relate to the increased pleural fluid.  CT ABDOMEN  AND PELVIS FINDINGS  No focal hepatic lesion. The gallbladder surgically absent. The pancreas, spleen, adrenal glands, kidneys normal.  The stomach, small bowel, cecum normal. Mild diverticulosis the sigmoid colon noted.  Abdominal aorta is normal caliber with intimal calcification. No retroperitoneal periportal lymphadenopathy.  No free fluid the pelvis. No pelvic lymphadenopathy. Prostate gland and bladder normal. No aggressive osseous lesion.  IMPRESSION: 1. Mild contraction of the cavitary lesion in the superior segment right lower lobe. 2. Posttherapy change at the right hilum. Increased pleural thickening along the fissure just superior to the lesion. This may in part relate to mild increase in right pleural effusion. 3. No evidence of metastasis in the abdomen or pelvis.   Electronically Signed   By: Suzy Bouchard M.D.   On: 07/10/2013 14:01   ASSESSMENT AND PLAN: this is a very pleasant 80 years old white male with metastatic  non-small cell lung cancer, adenocarcinoma with brain metastasis status post stereotactic radiotherapy to the brain lesions followed by palliative radiotherapy to the left lower lobe lung mass. The patient is status post 6 cycles of systemic chemotherapy with carboplatin and Alimta with stable disease. He is currently  undergoing maintenance chemotherapy with single agent Alimta, status post 3 cycles. Overall is tolerating his maintenance chemotherapy relatively well with the exception of some fatigue. Patient was discussed with and also seen by Dr. Julien Nordmann. His restaging CT scan revealed mild contraction of the cavitary lesion in the superior segment right lower lobe.there is no evidence of metastasis in the abdomen or pelvis. He will proceed with cycle #4 of his maintenance chemotherapy with single agent Alimta at 500 mg per meter squared given every 3 weeks. He will followup in 3 weeks prior to cycle #5.   The patient voices understanding of current disease status and treatment options and is in agreement with the current care plan.  All questions were answered. The patient knows to call the clinic with any problems, questions or concerns. We can certainly see the patient much sooner if necessary.  Carlton Adam, PA-C   Disclaimer: This note was dictated with voice recognition software. Similar sounding words can inadvertently be transcribed and may not be corrected upon review. Carlton Adam, PA-C 07/14/2013 ADDENDUM:  Hematology/Oncology Attending:  I had a face to face encounter with the patient. I recommended his care plan. This is a very pleasant 80 years old white male with metastatic non-small cell lung cancer, adenocarcinoma and currently on maintenance chemotherapy with single agent Alimta status post 3 cycles. He is tolerating his maintenance treatment fairly well with no significant adverse effects.   The patient had repeat CT scan of the chest, abdomen and pelvis performed  recently and it showed no evidence for disease progression. I discussed the scan results with the patient and his wife.  I recommended for him to continue with the maintenance treatment as scheduled. He will receive cycle #4 today. He would come back for follow up visit in 3 weeks with the start of cycle #5. The patient was advised to call immediately if he has any concerning symptoms in the interval.  Disclaimer: This note was dictated with voice recognition software. Similar sounding words can inadvertently be transcribed and may not be corrected upon review. Eilleen Kempf., MD 07/18/2013

## 2013-07-14 NOTE — Telephone Encounter (Signed)
Gave pt appt for lab and Md , emailed [place] regarding chemo

## 2013-07-14 NOTE — Telephone Encounter (Signed)
Per staff message and POF I have scheduled appts. Advised scheduler of appts. JMW  

## 2013-07-17 ENCOUNTER — Telehealth: Payer: Self-pay | Admitting: Internal Medicine

## 2013-07-17 NOTE — Patient Instructions (Signed)
Your CT scan revealed a reduction in the size of the cavitary lesion Followup in 3 weeks prior to your next scheduled cycle of maintenance chemotherapy

## 2013-07-17 NOTE — Telephone Encounter (Signed)
Talke to pt's wife gave her appt for lab,md and chemo for July and August

## 2013-07-21 ENCOUNTER — Other Ambulatory Visit: Payer: Self-pay | Admitting: Radiation Therapy

## 2013-07-21 DIAGNOSIS — C7949 Secondary malignant neoplasm of other parts of nervous system: Principal | ICD-10-CM

## 2013-07-21 DIAGNOSIS — C7931 Secondary malignant neoplasm of brain: Secondary | ICD-10-CM

## 2013-08-04 ENCOUNTER — Other Ambulatory Visit (HOSPITAL_BASED_OUTPATIENT_CLINIC_OR_DEPARTMENT_OTHER): Payer: Medicare Other

## 2013-08-04 ENCOUNTER — Ambulatory Visit (HOSPITAL_BASED_OUTPATIENT_CLINIC_OR_DEPARTMENT_OTHER): Payer: Medicare Other | Admitting: Internal Medicine

## 2013-08-04 ENCOUNTER — Ambulatory Visit (HOSPITAL_BASED_OUTPATIENT_CLINIC_OR_DEPARTMENT_OTHER): Payer: Medicare Other

## 2013-08-04 ENCOUNTER — Telehealth: Payer: Self-pay | Admitting: Internal Medicine

## 2013-08-04 ENCOUNTER — Encounter: Payer: Self-pay | Admitting: Internal Medicine

## 2013-08-04 VITALS — BP 146/70 | HR 68 | Temp 97.8°F | Resp 18 | Ht 71.0 in | Wt 165.4 lb

## 2013-08-04 DIAGNOSIS — C3490 Malignant neoplasm of unspecified part of unspecified bronchus or lung: Secondary | ICD-10-CM

## 2013-08-04 DIAGNOSIS — C7931 Secondary malignant neoplasm of brain: Secondary | ICD-10-CM

## 2013-08-04 DIAGNOSIS — C3410 Malignant neoplasm of upper lobe, unspecified bronchus or lung: Secondary | ICD-10-CM

## 2013-08-04 DIAGNOSIS — C7949 Secondary malignant neoplasm of other parts of nervous system: Secondary | ICD-10-CM

## 2013-08-04 DIAGNOSIS — C3430 Malignant neoplasm of lower lobe, unspecified bronchus or lung: Secondary | ICD-10-CM

## 2013-08-04 DIAGNOSIS — J449 Chronic obstructive pulmonary disease, unspecified: Secondary | ICD-10-CM

## 2013-08-04 DIAGNOSIS — Z5111 Encounter for antineoplastic chemotherapy: Secondary | ICD-10-CM

## 2013-08-04 DIAGNOSIS — C3431 Malignant neoplasm of lower lobe, right bronchus or lung: Secondary | ICD-10-CM

## 2013-08-04 LAB — COMPREHENSIVE METABOLIC PANEL (CC13)
ALT: 18 U/L (ref 0–55)
AST: 24 U/L (ref 5–34)
Albumin: 3.3 g/dL — ABNORMAL LOW (ref 3.5–5.0)
Alkaline Phosphatase: 178 U/L — ABNORMAL HIGH (ref 40–150)
Anion Gap: 10 mEq/L (ref 3–11)
BILIRUBIN TOTAL: 0.47 mg/dL (ref 0.20–1.20)
BUN: 13.9 mg/dL (ref 7.0–26.0)
CO2: 25 meq/L (ref 22–29)
CREATININE: 0.8 mg/dL (ref 0.7–1.3)
Calcium: 9.3 mg/dL (ref 8.4–10.4)
Chloride: 106 mEq/L (ref 98–109)
GLUCOSE: 129 mg/dL (ref 70–140)
Potassium: 3.7 mEq/L (ref 3.5–5.1)
Sodium: 141 mEq/L (ref 136–145)
Total Protein: 6.8 g/dL (ref 6.4–8.3)

## 2013-08-04 LAB — CBC WITH DIFFERENTIAL/PLATELET
BASO%: 0.2 % (ref 0.0–2.0)
Basophils Absolute: 0 10*3/uL (ref 0.0–0.1)
EOS%: 0 % (ref 0.0–7.0)
Eosinophils Absolute: 0 10*3/uL (ref 0.0–0.5)
HEMATOCRIT: 29.8 % — AB (ref 38.4–49.9)
HGB: 9.9 g/dL — ABNORMAL LOW (ref 13.0–17.1)
LYMPH%: 6.4 % — AB (ref 14.0–49.0)
MCH: 30.7 pg (ref 27.2–33.4)
MCHC: 33.2 g/dL (ref 32.0–36.0)
MCV: 92.4 fL (ref 79.3–98.0)
MONO#: 0.4 10*3/uL (ref 0.1–0.9)
MONO%: 6.9 % (ref 0.0–14.0)
NEUT#: 5.2 10*3/uL (ref 1.5–6.5)
NEUT%: 86.5 % — AB (ref 39.0–75.0)
PLATELETS: 269 10*3/uL (ref 140–400)
RBC: 3.23 10*6/uL — ABNORMAL LOW (ref 4.20–5.82)
RDW: 15.5 % — ABNORMAL HIGH (ref 11.0–14.6)
WBC: 6 10*3/uL (ref 4.0–10.3)
lymph#: 0.4 10*3/uL — ABNORMAL LOW (ref 0.9–3.3)

## 2013-08-04 MED ORDER — SODIUM CHLORIDE 0.9 % IV SOLN
Freq: Once | INTRAVENOUS | Status: AC
  Administered 2013-08-04: 13:00:00 via INTRAVENOUS

## 2013-08-04 MED ORDER — DEXAMETHASONE SODIUM PHOSPHATE 10 MG/ML IJ SOLN
INTRAMUSCULAR | Status: AC
  Filled 2013-08-04: qty 1

## 2013-08-04 MED ORDER — ONDANSETRON 8 MG/NS 50 ML IVPB
INTRAVENOUS | Status: AC
  Filled 2013-08-04: qty 8

## 2013-08-04 MED ORDER — DEXAMETHASONE SODIUM PHOSPHATE 10 MG/ML IJ SOLN
10.0000 mg | Freq: Once | INTRAMUSCULAR | Status: AC
  Administered 2013-08-04: 10 mg via INTRAVENOUS

## 2013-08-04 MED ORDER — ONDANSETRON 8 MG/50ML IVPB (CHCC)
8.0000 mg | Freq: Once | INTRAVENOUS | Status: AC
  Administered 2013-08-04: 8 mg via INTRAVENOUS

## 2013-08-04 MED ORDER — SODIUM CHLORIDE 0.9 % IV SOLN
500.0000 mg/m2 | Freq: Once | INTRAVENOUS | Status: AC
  Administered 2013-08-04: 975 mg via INTRAVENOUS
  Filled 2013-08-04: qty 39

## 2013-08-04 NOTE — Telephone Encounter (Signed)
gv adn rpinted appt sched and avs for pt for Aug and SEpt....s.ed added tx.

## 2013-08-04 NOTE — Patient Instructions (Signed)
[facility] Discharge Instructions for Patients Receiving Chemotherapy  Today you received the following chemotherapy agents alimta.  To help prevent nausea and vomiting after your treatment, we encourage you to take your nausea medication compazine 10 mg every 6-8 hours or zofran 8 mg every 12 hours as needed.   If you develop nausea and vomiting that is not controlled by your nausea medication, call the clinic.   BELOW ARE SYMPTOMS THAT SHOULD BE REPORTED IMMEDIATELY:  *FEVER GREATER THAN 100.5 F  *CHILLS WITH OR WITHOUT FEVER  NAUSEA AND VOMITING THAT IS NOT CONTROLLED WITH YOUR NAUSEA MEDICATION  *UNUSUAL SHORTNESS OF BREATH  *UNUSUAL BRUISING OR BLEEDING  TENDERNESS IN MOUTH AND THROAT WITH OR WITHOUT PRESENCE OF ULCERS  *URINARY PROBLEMS  *BOWEL PROBLEMS  UNUSUAL RASH Items with * indicate a potential emergency and should be followed up as soon as possible.  Feel free to call the clinic you have any questions or concerns. The clinic phone number is [phone].

## 2013-08-04 NOTE — Progress Notes (Signed)
[facility] Telephone:(336) [phone]   Fax:(336) [phone]  OFFICE PROGRESS NOTE  [name], [place] [address]  DIAGNOSIS: Stage IV ( T2b, N2, M1b) non-small cell lung cancer, adenocarcinoma with areas of squamous differentiation with negative EGFR mutation and negative ALK gene translocation, presented with left lower lobe lung mass in addition to mediastinal lymphadenopathy and brain metastases diagnosed in October of 2014  PRIOR THERAPY: 1) Stereotactic radiotherapy to 3 brain lesions under the care of Dr. Lisbeth Renshaw completed 12/08/2012. 2) Palliative radiotherapy to the left lower lobe lung mass under the care of Dr. Lisbeth Renshaw expected to be completed 01/01/2013. 3) Systemic chemotherapy with carboplatin for AUC of 5 and Alimta 500 mg/M2 every 3 weeks. First dose 12/23/2012. Status post 6 cycles  CURRENT THERAPY: Maintenance chemotherapy with single agent Alimta at 500 mg per meter squared given every 3 weeks. First cycle expected to be given 05/12/2013. Status post 4 cycles.   CHEMOTHERAPY INTENT: Palliative  CURRENT # OF CHEMOTHERAPY CYCLES: 5  CURRENT ANTIEMETICS:Zofran, dexamethasone and Compazine  CURRENT SMOKING STATUS: former smoker  ORAL CHEMOTHERAPY AND CONSENT: None  CURRENT BISPHOSPHONATES USE: None  PAIN MANAGEMENT: 0/10  NARCOTICS INDUCED CONSTIPATION: None  LIVING WILL AND CODE STATUS: ?  INTERVAL HISTORY: [name] 80 y.o. male returns to the clinic today for follow up visit accompanied by his wife. He tolerated the last cycle of his maintenance treatment fairly well except for mild fatigue. He is feeling much better today. The patient denied having any nausea or vomiting. He denied having any fever or chills. He has no chest pain, shortness of breath, cough or hemoptysis. He has no weight loss or night sweats. He is here today to start cycle #5 of his chemotherapy.  MEDICAL HISTORY: Past Medical History  Diagnosis  Date  . COPD (chronic obstructive pulmonary disease)   . Allergy     allergic rhinitis  . Hx of radiation therapy 12/09/12-01/01/13    lung 37.5Gy  . nscl ca w/ brain mets dx'd 08/2012    Patient has a lung mass which is being evaluated    ALLERGIES:  is allergic to sulfa antibiotics.  MEDICATIONS:  Current Outpatient Prescriptions  Medication Sig Dispense Refill  . clobetasol cream (TEMOVATE) 3.14 % Apply 1 application topically daily as needed (for itching).      Marland Kitchen dexamethasone (DECADRON) 4 MG tablet 4 mg by mouth twice a day the day before, day of and day after the chemotherapy every 3 weeks  40 tablet  1  . ferrous sulfate (IRON SUPPLEMENT) 325 (65 FE) MG tablet Take 325 mg by mouth daily with breakfast.      . folic acid (FOLVITE) 970 MCG tablet Take 0.5 tablets (400 mcg total) by mouth daily.  30 tablet  3  . loratadine (ALLERGY RELIEF) 10 MG tablet Take 10 mg by mouth daily as needed for allergies.       Marland Kitchen ondansetron (ZOFRAN) 4 MG tablet Take 1 tablet (4 mg total) by mouth every 6 (six) hours.  20 tablet  0  . Polyethyl Glycol-Propyl Glycol (SYSTANE OP) Place 1 drop into both eyes daily.      . simvastatin (ZOCOR) 10 MG tablet Take 1 tablet (10 mg total) by mouth at bedtime.  90 tablet  3  . acetaminophen (TYLENOL) 500 MG tablet Take 500 mg by mouth every 6 (six) hours as needed for mild pain.       Marland Kitchen  prochlorperazine (COMPAZINE) 10 MG tablet Take 1 tablet (10 mg total) by mouth every 6 (six) hours as needed for nausea or vomiting.  60 tablet  0   No current facility-administered medications for this visit.    SURGICAL HISTORY:  Past Surgical History  Procedure Laterality Date  . Cholecystectomy      REVIEW OF SYSTEMS:  Constitutional: negative Eyes: negative Ears, nose, mouth, throat, and face: negative Respiratory: negative Cardiovascular: negative Gastrointestinal: negative Genitourinary:negative Integument/breast: negative Hematologic/lymphatic:  negative Musculoskeletal:negative Neurological: negative Behavioral/Psych: negative Endocrine: negative Allergic/Immunologic: negative   PHYSICAL EXAMINATION: General appearance: alert, cooperative and no distress Head: Normocephalic, without obvious abnormality, atraumatic Neck: no adenopathy, no JVD, supple, symmetrical, trachea midline and thyroid not enlarged, symmetric, no tenderness/mass/nodules Lymph nodes: Cervical, supraclavicular, and axillary nodes normal. Resp: clear to auscultation bilaterally Back: symmetric, no curvature. ROM normal. No CVA tenderness. Cardio: regular rate and rhythm, S1, S2 normal, no murmur, click, rub or gallop GI: soft, non-tender; bowel sounds normal; no masses,  no organomegaly Extremities: extremities normal, atraumatic, no cyanosis or edema Neurologic: Alert and oriented X 3, normal strength and tone. Normal symmetric reflexes. Normal coordination and gait  ECOG PERFORMANCE STATUS: 1 - Symptomatic but completely ambulatory  Blood pressure 146/70, pulse 68, temperature 97.8 F (36.6 C), temperature source Oral, resp. rate 18, height $RemoveBe'5\' 11"'[id number]$  (1.803 m), weight 165 lb 6.4 oz (75.025 kg), SpO2 98.00%.  LABORATORY DATA: Lab Results  Component Value Date   WBC 6.0 08/04/2013   HGB 9.9* 08/04/2013   HCT 29.8* 08/04/2013   MCV 92.4 08/04/2013   PLT 269 08/04/2013      Chemistry      Component Value Date/Time   NA 140 07/14/2013 1143   NA 135* 06/28/2013 1150   NA CANCELED 10/13/2012 1149   K 4.0 07/14/2013 1143   K 3.6* 06/28/2013 1150   CL 94* 06/28/2013 1150   CO2 23 07/14/2013 1143   CO2 29 06/28/2013 1150   BUN 14.7 07/14/2013 1143   BUN 15 06/28/2013 1150   BUN CANCELED 10/13/2012 1149   CREATININE 0.8 07/14/2013 1143   CREATININE 0.71 06/28/2013 1150      Component Value Date/Time   CALCIUM 9.3 07/14/2013 1143   CALCIUM 8.9 06/28/2013 1150   ALKPHOS 169* 07/14/2013 1143   ALKPHOS CANCELED 10/13/2012 1149   AST 25 07/14/2013 1143   AST CANCELED  10/13/2012 1149   ALT 17 07/14/2013 1143   ALT CANCELED 10/13/2012 1149   BILITOT 0.34 07/14/2013 1143   BILITOT CANCELED 10/13/2012 1149       RADIOGRAPHIC STUDIES:   ASSESSMENT AND PLAN: this is a very pleasant 80 years old white male with metastatic non-small cell lung cancer, adenocarcinoma with brain metastasis status post stereotactic radiotherapy to the brain lesions followed by palliative radiotherapy to the left lower lobe lung mass. The patient is currently undergoing systemic chemotherapy with carboplatin and Alimta status post 6 cycles with stable disease and he is currently undergoing maintenance chemotherapy with single agent Alimta status post 4 cycles. He is tolerating the treatment well except for mild fatigue after his last treatment.  I recommended for the patient to proceed with cycle #5 today as scheduled. He would come back for followup visit in 3 weeks with the next cycle of his treatment. The patient voices understanding of current disease status and treatment options and is in agreement with the current care plan.  All questions were answered. The patient knows to call the clinic with any problems,  questions or concerns. We can certainly see the patient much sooner if necessary.  Disclaimer: This note was dictated with voice recognition software. Similar sounding words can inadvertently be transcribed and may not be corrected upon review.

## 2013-08-24 ENCOUNTER — Ambulatory Visit (INDEPENDENT_AMBULATORY_CARE_PROVIDER_SITE_OTHER): Payer: Medicare Other | Admitting: Family

## 2013-08-24 ENCOUNTER — Encounter: Payer: Self-pay | Admitting: Family

## 2013-08-24 VITALS — BP 143/82 | HR 82 | Temp 97.7°F | Ht 68.0 in | Wt 167.0 lb

## 2013-08-24 DIAGNOSIS — T148XXA Other injury of unspecified body region, initial encounter: Secondary | ICD-10-CM

## 2013-08-24 NOTE — Patient Instructions (Signed)

## 2013-08-24 NOTE — Progress Notes (Signed)
   Subjective:    Patient ID: [name], male    DOB: [birth year], 80 y.o.   MRN: [id number]  HPI Pt presents to the office after working on a car yesterday and drilled through is nail bed on his index finger. Pt states his TDAP is overdue. However, pt is scheduled to  get chemotherapy tomorrow.    Review of Systems  Constitutional: Negative.   HENT: Negative.   Respiratory: Negative.   Cardiovascular: Negative.   Gastrointestinal: Negative.   Endocrine: Negative.   Genitourinary: Negative.   Musculoskeletal: Negative.   Neurological: Negative.   Hematological: Negative.   Psychiatric/Behavioral: Negative.   All other systems reviewed and are negative.      Objective:   Physical Exam  Vitals reviewed. Constitutional: He is oriented to person, place, and time. He appears well-developed and well-nourished. No distress.  Eyes: Pupils are equal, round, and reactive to light. Right eye exhibits no discharge. Left eye exhibits no discharge.  Neck: Normal range of motion. Neck supple. No thyromegaly present.  Cardiovascular: Normal rate, regular rhythm, normal heart sounds and intact distal pulses.   No murmur heard. Pulmonary/Chest: Effort normal and breath sounds normal. No respiratory distress. He has no wheezes.  Abdominal: Soft. Bowel sounds are normal. He exhibits no distension. There is no tenderness.  Musculoskeletal: Normal range of motion. He exhibits no edema and no tenderness.  Neurological: He is alert and oriented to person, place, and time. He has normal reflexes. No cranial nerve deficit.  Skin: Skin is warm and dry. No rash noted. No erythema.  Nailbed of pt's index finger on his right is slightly bloody with a puncture hole in nail bed.    Psychiatric: He has a normal mood and affect. His behavior is normal. Judgment and thought content normal.   BP 143/82  Pulse 82  Temp(Src) 97.7 F (36.5 C) (Oral)  Ht 5\' 8"  (1.727 m)  Wt 167 lb (75.751 kg)  BMI 25.40  kg/m2        Assessment & Plan:  1. Puncture wound -Keep wound clean and dry -Apply antibiotic cream and keep dressed -Report any s/s of infection -Pt to ask Oncologists tomorrow if ok to get TDAP and if so pt will see nurse to get immunization -RTO prn  Evelina Dun, FNP

## 2013-08-25 ENCOUNTER — Encounter: Payer: Self-pay | Admitting: Oncology

## 2013-08-25 ENCOUNTER — Ambulatory Visit (HOSPITAL_BASED_OUTPATIENT_CLINIC_OR_DEPARTMENT_OTHER): Payer: Medicare Other

## 2013-08-25 ENCOUNTER — Ambulatory Visit (HOSPITAL_BASED_OUTPATIENT_CLINIC_OR_DEPARTMENT_OTHER): Payer: Medicare Other | Admitting: Physician Assistant

## 2013-08-25 ENCOUNTER — Telehealth: Payer: Self-pay | Admitting: Internal Medicine

## 2013-08-25 ENCOUNTER — Other Ambulatory Visit: Payer: Medicare Other

## 2013-08-25 ENCOUNTER — Encounter: Payer: Self-pay | Admitting: Physician Assistant

## 2013-08-25 ENCOUNTER — Other Ambulatory Visit (HOSPITAL_BASED_OUTPATIENT_CLINIC_OR_DEPARTMENT_OTHER): Payer: Medicare Other

## 2013-08-25 ENCOUNTER — Ambulatory Visit: Payer: Medicare Other

## 2013-08-25 VITALS — BP 137/80 | HR 74 | Temp 97.7°F | Resp 18 | Ht 68.0 in | Wt 167.9 lb

## 2013-08-25 DIAGNOSIS — C3431 Malignant neoplasm of lower lobe, right bronchus or lung: Secondary | ICD-10-CM

## 2013-08-25 DIAGNOSIS — C7931 Secondary malignant neoplasm of brain: Secondary | ICD-10-CM

## 2013-08-25 DIAGNOSIS — C3430 Malignant neoplasm of lower lobe, unspecified bronchus or lung: Secondary | ICD-10-CM

## 2013-08-25 DIAGNOSIS — C7949 Secondary malignant neoplasm of other parts of nervous system: Secondary | ICD-10-CM

## 2013-08-25 DIAGNOSIS — Z5111 Encounter for antineoplastic chemotherapy: Secondary | ICD-10-CM

## 2013-08-25 DIAGNOSIS — C3490 Malignant neoplasm of unspecified part of unspecified bronchus or lung: Secondary | ICD-10-CM

## 2013-08-25 LAB — CBC WITH DIFFERENTIAL/PLATELET
BASO%: 0.2 % (ref 0.0–2.0)
Basophils Absolute: 0 10*3/uL (ref 0.0–0.1)
EOS ABS: 0 10*3/uL (ref 0.0–0.5)
EOS%: 0 % (ref 0.0–7.0)
HEMATOCRIT: 29.8 % — AB (ref 38.4–49.9)
HEMOGLOBIN: 10 g/dL — AB (ref 13.0–17.1)
LYMPH%: 8.2 % — ABNORMAL LOW (ref 14.0–49.0)
MCH: 30.9 pg (ref 27.2–33.4)
MCHC: 33.6 g/dL (ref 32.0–36.0)
MCV: 92 fL (ref 79.3–98.0)
MONO#: 0.6 10*3/uL (ref 0.1–0.9)
MONO%: 9.5 % (ref 0.0–14.0)
NEUT%: 82.1 % — AB (ref 39.0–75.0)
NEUTROS ABS: 4.9 10*3/uL (ref 1.5–6.5)
Platelets: 187 10*3/uL (ref 140–400)
RBC: 3.24 10*6/uL — ABNORMAL LOW (ref 4.20–5.82)
RDW: 15.1 % — ABNORMAL HIGH (ref 11.0–14.6)
WBC: 6 10*3/uL (ref 4.0–10.3)
lymph#: 0.5 10*3/uL — ABNORMAL LOW (ref 0.9–3.3)

## 2013-08-25 LAB — COMPREHENSIVE METABOLIC PANEL
ALT: 20 U/L (ref 0–53)
AST: 31 U/L (ref 0–37)
Albumin: 3.4 g/dL — ABNORMAL LOW (ref 3.5–5.2)
Alkaline Phosphatase: 185 U/L — ABNORMAL HIGH (ref 39–117)
BUN: 11 mg/dL (ref 6–23)
CHLORIDE: 102 meq/L (ref 96–112)
CO2: 24 meq/L (ref 19–32)
CREATININE: 0.76 mg/dL (ref 0.50–1.35)
Calcium: 9.4 mg/dL (ref 8.4–10.5)
Glucose, Bld: 143 mg/dL — ABNORMAL HIGH (ref 70–99)
Potassium: 3.9 mEq/L (ref 3.5–5.3)
Sodium: 141 mEq/L (ref 135–145)
Total Bilirubin: 0.3 mg/dL (ref 0.2–1.2)
Total Protein: 7 g/dL (ref 6.0–8.3)

## 2013-08-25 MED ORDER — SODIUM CHLORIDE 0.9 % IV SOLN
Freq: Once | INTRAVENOUS | Status: AC
  Administered 2013-08-25: 12:00:00 via INTRAVENOUS

## 2013-08-25 MED ORDER — CYANOCOBALAMIN 1000 MCG/ML IJ SOLN
INTRAMUSCULAR | Status: AC
  Filled 2013-08-25: qty 1

## 2013-08-25 MED ORDER — PEMETREXED DISODIUM CHEMO INJECTION 500 MG
500.0000 mg/m2 | Freq: Once | INTRAVENOUS | Status: AC
  Administered 2013-08-25: 975 mg via INTRAVENOUS
  Filled 2013-08-25: qty 39

## 2013-08-25 MED ORDER — ONDANSETRON 8 MG/50ML IVPB (CHCC)
8.0000 mg | Freq: Once | INTRAVENOUS | Status: AC
  Administered 2013-08-25: 8 mg via INTRAVENOUS

## 2013-08-25 MED ORDER — DEXAMETHASONE SODIUM PHOSPHATE 10 MG/ML IJ SOLN
INTRAMUSCULAR | Status: AC
  Filled 2013-08-25: qty 1

## 2013-08-25 MED ORDER — DEXAMETHASONE SODIUM PHOSPHATE 10 MG/ML IJ SOLN
10.0000 mg | Freq: Once | INTRAMUSCULAR | Status: AC
  Administered 2013-08-25: 10 mg via INTRAVENOUS

## 2013-08-25 MED ORDER — CYANOCOBALAMIN 1000 MCG/ML IJ SOLN
1000.0000 ug | Freq: Once | INTRAMUSCULAR | Status: AC
  Administered 2013-08-25: 1000 ug via INTRAMUSCULAR

## 2013-08-25 MED ORDER — ONDANSETRON 8 MG/NS 50 ML IVPB
INTRAVENOUS | Status: AC
  Filled 2013-08-25: qty 8

## 2013-08-25 NOTE — Telephone Encounter (Signed)
gv pt appt schedule for sept. [facility] will call re ct - pt aware.

## 2013-08-25 NOTE — Progress Notes (Signed)
[place] Telephone:(336) [phone]   Fax:(336) [phone]  OFFICE PROGRESS NOTE  [name], [name] [id number]  DIAGNOSIS: Stage IV ( T2b, N2, M1b) non-small cell lung cancer, adenocarcinoma with areas of squamous differentiation with negative EGFR mutation and negative ALK gene translocation, presented with left lower lobe lung mass in addition to mediastinal lymphadenopathy and brain metastases diagnosed in October of 2014  PRIOR THERAPY: 1) Stereotactic radiotherapy to 3 brain lesions under the care of Dr. Lisbeth Renshaw completed 12/08/2012. 2) Palliative radiotherapy to the left lower lobe lung mass under the care of Dr. Lisbeth Renshaw expected to be completed 01/01/2013. 3) Systemic chemotherapy with carboplatin for AUC of 5 and Alimta 500 mg/M2 every 3 weeks. First dose 12/23/2012. Status post 6 cycles  CURRENT THERAPY: Maintenance chemotherapy with single agent Alimta at 500 mg per meter squared given every 3 weeks. First cycle expected to be given 05/12/2013. Status post 5 cycles.   CHEMOTHERAPY INTENT: Palliative  CURRENT # OF CHEMOTHERAPY CYCLES: 6  CURRENT ANTIEMETICS:Zofran, dexamethasone and Compazine  CURRENT SMOKING STATUS: former smoker  ORAL CHEMOTHERAPY AND CONSENT: None  CURRENT BISPHOSPHONATES USE: None  PAIN MANAGEMENT: 0/10  NARCOTICS INDUCED CONSTIPATION: None  LIVING WILL AND CODE STATUS: ?  INTERVAL HISTORY: [name] 80 y.o. male returns to the clinic today for follow up visit accompanied by his wife. He tolerated the last cycle of his maintenance treatment fairly well except for mild fatigue. 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 He was seen by his primary care physician's office however they were reluctant to give him his tetanus booster as he was receiving chemotherapy. He will require note from [place] to give his primary care physician's office so that he can his tetanus  booster shot. He voiced no other specific complaints today. The patient denied having any nausea or vomiting. He denied having any fever or chills. He has no chest pain, shortness of breath, cough or hemoptysis. He has no weight loss or night sweats. He is here today to start cycle #6 of his chemotherapy.  MEDICAL HISTORY: Past Medical History  Diagnosis Date  . COPD (chronic obstructive pulmonary disease)   . Allergy     allergic rhinitis  . Hx of radiation therapy 12/09/12-01/01/13    lung 37.5Gy  . nscl ca w/ brain mets dx'd 08/2012    Patient has a lung mass which is being evaluated    ALLERGIES:  is allergic to sulfa antibiotics.  MEDICATIONS:  Current Outpatient Prescriptions  Medication Sig Dispense Refill  . dexamethasone (DECADRON) 4 MG tablet 4 mg by mouth twice a day the day before, day of and day after the chemotherapy every 3 weeks  40 tablet  1  . ferrous sulfate (IRON SUPPLEMENT) 325 (65 FE) MG tablet Take 325 mg by mouth daily with breakfast.      . folic acid (FOLVITE) 381 MCG tablet Take 0.5 tablets (400 mcg total) by mouth daily.  30 tablet  3  . Polyethyl Glycol-Propyl Glycol (SYSTANE OP) Place 1 drop into both eyes daily.      . simvastatin (ZOCOR) 10 MG tablet Take 1 tablet (10 mg total) by mouth at bedtime.  90 tablet  3  . acetaminophen (TYLENOL) 500 MG tablet Take 500 mg by mouth every 6 (six) hours as needed for mild pain.       . clobetasol cream (TEMOVATE) 0.05 %  Apply 1 application topically daily as needed (for itching).      Marland Kitchen loratadine (ALLERGY RELIEF) 10 MG tablet Take 10 mg by mouth daily as needed for allergies.       Marland Kitchen ondansetron (ZOFRAN) 4 MG tablet Take 1 tablet (4 mg total) by mouth every 6 (six) hours.  20 tablet  0  . prochlorperazine (COMPAZINE) 10 MG tablet Take 1 tablet (10 mg total) by mouth every 6 (six) hours as needed for nausea or vomiting.  60 tablet  0   No current facility-administered medications for this visit.    SURGICAL  HISTORY:  Past Surgical History  Procedure Laterality Date  . Cholecystectomy      REVIEW OF SYSTEMS:  Constitutional: negative Eyes: negative Ears, nose, mouth, throat, and face: negative Respiratory: negative Cardiovascular: negative Gastrointestinal: negative Genitourinary:negative Integument/breast: negative Hematologic/lymphatic: negative Musculoskeletal:negative Neurological: negative Behavioral/Psych: negative Endocrine: negative Allergic/Immunologic: negative   PHYSICAL EXAMINATION: General appearance: alert, cooperative and no distress Head: Normocephalic, without obvious abnormality, atraumatic Neck: no adenopathy, no JVD, supple, symmetrical, trachea midline and thyroid not enlarged, symmetric, no tenderness/mass/nodules Lymph nodes: Cervical, supraclavicular, and axillary nodes normal. Resp: clear to auscultation bilaterally Back: symmetric, no curvature. ROM normal. No CVA tenderness. Cardio: regular rate and rhythm, S1, S2 normal, no murmur, click, rub or gallop GI: soft, non-tender; bowel sounds normal; no masses,  no organomegaly Extremities: extremities normal, atraumatic, no cyanosis or edema Neurologic: Alert and oriented X 3, normal strength and tone. Normal symmetric reflexes. Normal coordination and gait  ECOG PERFORMANCE STATUS: 1 - Symptomatic but completely ambulatory  Blood pressure 137/80, pulse 74, temperature 97.7 F (36.5 C), temperature source Oral, resp. rate 18, height $RemoveBe'5\' 8"'[id number]$  (1.727 m), weight 167 lb 14.4 oz (76.159 kg), SpO2 99.00%.  LABORATORY DATA: Lab Results  Component Value Date   WBC 6.0 08/25/2013   HGB 10.0* 08/25/2013   HCT 29.8* 08/25/2013   MCV 92.0 08/25/2013   PLT 187 08/25/2013      Chemistry      Component Value Date/Time   NA 141 08/25/2013 1000   NA 141 08/04/2013 1057   NA CANCELED 10/13/2012 1149   K 3.9 08/25/2013 1000   K 3.7 08/04/2013 1057   CL 102 08/25/2013 1000   CO2 24 08/25/2013 1000   CO2 25 08/04/2013 1057    BUN 11 08/25/2013 1000   BUN 13.9 08/04/2013 1057   BUN CANCELED 10/13/2012 1149   CREATININE 0.76 08/25/2013 1000   CREATININE 0.8 08/04/2013 1057      Component Value Date/Time   CALCIUM 9.4 08/25/2013 1000   CALCIUM 9.3 08/04/2013 1057   ALKPHOS 185* 08/25/2013 1000   ALKPHOS 178* 08/04/2013 1057   AST 31 08/25/2013 1000   AST 24 08/04/2013 1057   ALT 20 08/25/2013 1000   ALT 18 08/04/2013 1057   BILITOT 0.3 08/25/2013 1000   BILITOT 0.47 08/04/2013 1057       RADIOGRAPHIC STUDIES:   ASSESSMENT AND PLAN: this is a very pleasant 80 years old white male with metastatic non-small cell lung cancer, adenocarcinoma with brain metastasis status post stereotactic radiotherapy to the brain lesions followed by palliative radiotherapy to the left lower lobe lung mass. The patient is currently undergoing systemic chemotherapy with carboplatin and Alimta status post 6 cycles with stable disease. He is currently undergoing maintenance chemotherapy with single agent Alimta status post 5 cycles. He is tolerating the treatment well except for mild fatigue after his last treatment.  He will proceed with  cycle #6 today as scheduled. He'll followup in 3 weeks with a restaging CT scan of the chest, abdomen and pelvis with contrast to reevaluate his disease. He was given a note to take back to his primary care physician's office that it is fine for him to receive his tetanus booster.  The patient voices understanding of current disease status and treatment options and is in agreement with the current care plan.  All questions were answered. The patient knows to call the clinic with any problems, questions or concerns. We can certainly see the patient much sooner if necessary.  Carlton Adam, PA-C   Disclaimer: This note was dictated with voice recognition software. Similar sounding words can inadvertently be transcribed and may not be corrected upon review.

## 2013-08-25 NOTE — Patient Instructions (Signed)
[facility] Discharge Instructions for Patients Receiving Chemotherapy  Today you received the following chemotherapy agents alimta.  To help prevent nausea and vomiting after your treatment, we encourage you to take your nausea medication compazine 10 mg every 6-8 hours or zofran 8 mg every 12 hours as needed.   If you develop nausea and vomiting that is not controlled by your nausea medication, call the clinic.   BELOW ARE SYMPTOMS THAT SHOULD BE REPORTED IMMEDIATELY:  *FEVER GREATER THAN 100.5 F  *CHILLS WITH OR WITHOUT FEVER  NAUSEA AND VOMITING THAT IS NOT CONTROLLED WITH YOUR NAUSEA MEDICATION  *UNUSUAL SHORTNESS OF BREATH  *UNUSUAL BRUISING OR BLEEDING  TENDERNESS IN MOUTH AND THROAT WITH OR WITHOUT PRESENCE OF ULCERS  *URINARY PROBLEMS  *BOWEL PROBLEMS  UNUSUAL RASH Items with * indicate a potential emergency and should be followed up as soon as possible.  Feel free to call the clinic you have any questions or concerns. The clinic phone number is [phone].

## 2013-08-26 ENCOUNTER — Ambulatory Visit (INDEPENDENT_AMBULATORY_CARE_PROVIDER_SITE_OTHER): Payer: Medicare Other | Admitting: *Deleted

## 2013-08-26 DIAGNOSIS — Z23 Encounter for immunization: Secondary | ICD-10-CM

## 2013-09-01 NOTE — Patient Instructions (Signed)
You may receive a tetanus booster at your primary care physician's office Followup in 3 weeks with a restaging CT scan of her chest, abdomen and pelvis to reevaluate her disease

## 2013-09-11 ENCOUNTER — Ambulatory Visit (HOSPITAL_COMMUNITY)
Admission: RE | Admit: 2013-09-11 | Discharge: 2013-09-11 | Disposition: A | Discharge status: Home / Self Care | Payer: Medicare Other | Source: Ambulatory Visit | Attending: Physician Assistant | Admitting: Physician Assistant

## 2013-09-11 ENCOUNTER — Encounter (HOSPITAL_COMMUNITY): Payer: Self-pay

## 2013-09-11 DIAGNOSIS — Z923 Personal history of irradiation: Secondary | ICD-10-CM | POA: Diagnosis not present

## 2013-09-11 DIAGNOSIS — C3490 Malignant neoplasm of unspecified part of unspecified bronchus or lung: Secondary | ICD-10-CM | POA: Insufficient documentation

## 2013-09-11 MED ORDER — IOHEXOL 300 MG/ML  SOLN
100.0000 mL | Freq: Once | INTRAMUSCULAR | Status: AC | PRN
  Administered 2013-09-11: 100 mL via INTRAVENOUS

## 2013-09-15 ENCOUNTER — Ambulatory Visit (HOSPITAL_BASED_OUTPATIENT_CLINIC_OR_DEPARTMENT_OTHER): Payer: Medicare Other | Admitting: Internal Medicine

## 2013-09-15 ENCOUNTER — Ambulatory Visit: Payer: Medicare Other

## 2013-09-15 ENCOUNTER — Other Ambulatory Visit (HOSPITAL_BASED_OUTPATIENT_CLINIC_OR_DEPARTMENT_OTHER): Payer: Medicare Other

## 2013-09-15 ENCOUNTER — Ambulatory Visit (HOSPITAL_BASED_OUTPATIENT_CLINIC_OR_DEPARTMENT_OTHER): Payer: Medicare Other

## 2013-09-15 ENCOUNTER — Other Ambulatory Visit: Payer: Self-pay | Admitting: *Deleted

## 2013-09-15 ENCOUNTER — Telehealth: Payer: Self-pay | Admitting: Internal Medicine

## 2013-09-15 ENCOUNTER — Other Ambulatory Visit: Payer: Medicare Other

## 2013-09-15 ENCOUNTER — Encounter: Payer: Self-pay | Admitting: Internal Medicine

## 2013-09-15 VITALS — BP 134/84 | HR 71 | Temp 97.9°F | Resp 18 | Ht 68.0 in | Wt 165.8 lb

## 2013-09-15 DIAGNOSIS — C3490 Malignant neoplasm of unspecified part of unspecified bronchus or lung: Secondary | ICD-10-CM

## 2013-09-15 DIAGNOSIS — C3431 Malignant neoplasm of lower lobe, right bronchus or lung: Secondary | ICD-10-CM

## 2013-09-15 DIAGNOSIS — C7931 Secondary malignant neoplasm of brain: Secondary | ICD-10-CM

## 2013-09-15 DIAGNOSIS — Z5111 Encounter for antineoplastic chemotherapy: Secondary | ICD-10-CM

## 2013-09-15 DIAGNOSIS — C3430 Malignant neoplasm of lower lobe, unspecified bronchus or lung: Secondary | ICD-10-CM

## 2013-09-15 DIAGNOSIS — C7949 Secondary malignant neoplasm of other parts of nervous system: Secondary | ICD-10-CM

## 2013-09-15 LAB — CBC WITH DIFFERENTIAL/PLATELET
BASO%: 0.2 % (ref 0.0–2.0)
BASOS ABS: 0 10*3/uL (ref 0.0–0.1)
EOS%: 0 % (ref 0.0–7.0)
Eosinophils Absolute: 0 10*3/uL (ref 0.0–0.5)
HEMATOCRIT: 28.3 % — AB (ref 38.4–49.9)
HEMOGLOBIN: 9.5 g/dL — AB (ref 13.0–17.1)
LYMPH#: 0.4 10*3/uL — AB (ref 0.9–3.3)
LYMPH%: 7.8 % — ABNORMAL LOW (ref 14.0–49.0)
MCH: 31.1 pg (ref 27.2–33.4)
MCHC: 33.6 g/dL (ref 32.0–36.0)
MCV: 92.8 fL (ref 79.3–98.0)
MONO#: 0.5 10*3/uL (ref 0.1–0.9)
MONO%: 8.7 % (ref 0.0–14.0)
NEUT#: 4.7 10*3/uL (ref 1.5–6.5)
NEUT%: 83.3 % — ABNORMAL HIGH (ref 39.0–75.0)
Platelets: 244 10*3/uL (ref 140–400)
RBC: 3.05 10*6/uL — ABNORMAL LOW (ref 4.20–5.82)
RDW: 15.2 % — ABNORMAL HIGH (ref 11.0–14.6)
WBC: 5.6 10*3/uL (ref 4.0–10.3)

## 2013-09-15 LAB — COMPREHENSIVE METABOLIC PANEL (CC13)
ALT: 17 U/L (ref 0–55)
AST: 24 U/L (ref 5–34)
Albumin: 3.3 g/dL — ABNORMAL LOW (ref 3.5–5.0)
Alkaline Phosphatase: 153 U/L — ABNORMAL HIGH (ref 40–150)
Anion Gap: 10 mEq/L (ref 3–11)
BILIRUBIN TOTAL: 0.44 mg/dL (ref 0.20–1.20)
BUN: 13.2 mg/dL (ref 7.0–26.0)
CHLORIDE: 105 meq/L (ref 98–109)
CO2: 23 mEq/L (ref 22–29)
CREATININE: 0.9 mg/dL (ref 0.7–1.3)
Calcium: 9.2 mg/dL (ref 8.4–10.4)
GLUCOSE: 111 mg/dL (ref 70–140)
Potassium: 3.8 mEq/L (ref 3.5–5.1)
Sodium: 139 mEq/L (ref 136–145)
Total Protein: 6.6 g/dL (ref 6.4–8.3)

## 2013-09-15 MED ORDER — SODIUM CHLORIDE 0.9 % IV SOLN
500.0000 mg/m2 | Freq: Once | INTRAVENOUS | Status: AC
  Administered 2013-09-15: 975 mg via INTRAVENOUS
  Filled 2013-09-15: qty 39

## 2013-09-15 MED ORDER — SODIUM CHLORIDE 0.9 % IV SOLN
Freq: Once | INTRAVENOUS | Status: AC
  Administered 2013-09-15: 13:00:00 via INTRAVENOUS

## 2013-09-15 MED ORDER — ONDANSETRON 8 MG/50ML IVPB (CHCC)
8.0000 mg | Freq: Once | INTRAVENOUS | Status: AC
  Administered 2013-09-15: 8 mg via INTRAVENOUS

## 2013-09-15 MED ORDER — ONDANSETRON 8 MG/NS 50 ML IVPB
INTRAVENOUS | Status: AC
  Filled 2013-09-15: qty 8

## 2013-09-15 MED ORDER — DEXAMETHASONE SODIUM PHOSPHATE 10 MG/ML IJ SOLN
INTRAMUSCULAR | Status: AC
  Filled 2013-09-15: qty 1

## 2013-09-15 MED ORDER — DEXAMETHASONE 4 MG PO TABS: ORAL_TABLET | ORAL | Status: DC

## 2013-09-15 MED ORDER — DEXAMETHASONE SODIUM PHOSPHATE 10 MG/ML IJ SOLN
10.0000 mg | Freq: Once | INTRAMUSCULAR | Status: AC
  Administered 2013-09-15: 10 mg via INTRAVENOUS

## 2013-09-15 NOTE — Patient Instructions (Signed)
[facility] Discharge Instructions for Patients Receiving Chemotherapy  Today you received the following chemotherapy agents Alimtia  To help prevent nausea and vomiting after your treatment, we encourage you to take your nausea medication  Zofran as directed If you develop nausea and vomiting that is not controlled by your nausea medication, call the clinic.   BELOW ARE SYMPTOMS THAT SHOULD BE REPORTED IMMEDIATELY:  *FEVER GREATER THAN 100.5 F  *CHILLS WITH OR WITHOUT FEVER  NAUSEA AND VOMITING THAT IS NOT CONTROLLED WITH YOUR NAUSEA MEDICATION  *UNUSUAL SHORTNESS OF BREATH  *UNUSUAL BRUISING OR BLEEDING  TENDERNESS IN MOUTH AND THROAT WITH OR WITHOUT PRESENCE OF ULCERS  *URINARY PROBLEMS  *BOWEL PROBLEMS  UNUSUAL RASH Items with * indicate a potential emergency and should be followed up as soon as possible.  Feel free to call the clinic you have any questions or concerns. The clinic phone number is [phone].

## 2013-09-15 NOTE — Telephone Encounter (Signed)
[facility] AND PRINTED APPT SCHED AND AVS FOR PT FOR SEPT AND OCT...SED ADDED TX.

## 2013-09-15 NOTE — Progress Notes (Signed)
[facility] [phone]   [phone]  OFFICE PROGRESS NOTE  [name], [place] [address]  DIAGNOSIS: Stage IV ( T2b, N2, M1b) non-small cell lung cancer, adenocarcinoma with areas of squamous differentiation with negative EGFR mutation and negative ALK gene translocation, presented with left lower lobe lung mass in addition to mediastinal lymphadenopathy and brain metastases diagnosed in October of 2014  PRIOR THERAPY: 1) Stereotactic radiotherapy to 3 brain lesions under the care of Dr. Lisbeth Renshaw completed 12/08/2012. 2) Palliative radiotherapy to the left lower lobe lung mass under the care of Dr. Lisbeth Renshaw expected to be completed 01/01/2013. 3) Systemic chemotherapy with carboplatin for AUC of 5 and Alimta 500 mg/M2 every 3 weeks. First dose 12/23/2012. Status post 6 cycles  CURRENT THERAPY: Maintenance chemotherapy with single agent Alimta at 500 mg per meter squared given every 3 weeks. First cycle expected to be given 05/12/2013. Status post 6 cycles.   CHEMOTHERAPY INTENT: Palliative  CURRENT # OF CHEMOTHERAPY CYCLES: 7  CURRENT ANTIEMETICS:Zofran, dexamethasone and Compazine  CURRENT SMOKING STATUS: former smoker  ORAL CHEMOTHERAPY AND CONSENT: None  CURRENT BISPHOSPHONATES USE: None  PAIN MANAGEMENT: 0/10  NARCOTICS INDUCED CONSTIPATION: None  LIVING WILL AND CODE STATUS: ?  INTERVAL HISTORY: [name] 80 y.o. male returns to the clinic today for follow up visit accompanied by his wife. He has been on maintenance chemotherapy with single agent Alimta for the last 6 cycles. He tolerated the last cycle of his treatment fairly well except for mild fatigue.  The patient denied having any nausea or vomiting. He denied having any fever or chills. He has no chest pain, shortness of breath, cough or hemoptysis. He has no weight loss or night sweats. He had repeat CT scan of the chest, abdomen and pelvis performed  recently and he is here for evaluation and discussion of his scan results.   MEDICAL HISTORY: Past Medical History  Diagnosis Date  . COPD (chronic obstructive pulmonary disease)   . Allergy     allergic rhinitis  . Hx of radiation therapy 12/09/12-01/01/13    lung 37.5Gy  . nscl ca w/ brain mets dx'd 08/2012    Patient has a lung mass which is being evaluated    ALLERGIES:  is allergic to sulfa antibiotics.  MEDICATIONS:  Current Outpatient Prescriptions  Medication Sig Dispense Refill  . acetaminophen (TYLENOL) 500 MG tablet Take 500 mg by mouth every 6 (six) hours as needed for mild pain.       . clobetasol cream (TEMOVATE) 8.12 % Apply 1 application topically daily as needed (for itching).      Marland Kitchen dexamethasone (DECADRON) 4 MG tablet 4 mg by mouth twice a day the day before, day of and day after the chemotherapy every 3 weeks  40 tablet  1  . ferrous sulfate (IRON SUPPLEMENT) 325 (65 FE) MG tablet Take 325 mg by mouth daily with breakfast.      . folic acid (FOLVITE) 751 MCG tablet Take 0.5 tablets (400 mcg total) by mouth daily.  30 tablet  3  . loratadine (ALLERGY RELIEF) 10 MG tablet Take 10 mg by mouth daily as needed for allergies.       Marland Kitchen ondansetron (ZOFRAN) 4 MG tablet Take 1 tablet (4 mg total) by mouth every 6 (six) hours.  20 tablet  0  . Polyethyl Glycol-Propyl Glycol (SYSTANE OP) Place 1 drop into both eyes daily.      Marland Kitchen  prochlorperazine (COMPAZINE) 10 MG tablet Take 1 tablet (10 mg total) by mouth every 6 (six) hours as needed for nausea or vomiting.  60 tablet  0  . simvastatin (ZOCOR) 10 MG tablet Take 1 tablet (10 mg total) by mouth at bedtime.  90 tablet  3   No current facility-administered medications for this visit.    SURGICAL HISTORY:  Past Surgical History  Procedure Laterality Date  . Cholecystectomy      REVIEW OF SYSTEMS:  Constitutional: negative Eyes: negative Ears, nose, mouth, throat, and face: negative Respiratory: negative Cardiovascular:  negative Gastrointestinal: negative Genitourinary:negative Integument/breast: negative Hematologic/lymphatic: negative Musculoskeletal:negative Neurological: negative Behavioral/Psych: negative Endocrine: negative Allergic/Immunologic: negative   PHYSICAL EXAMINATION: General appearance: alert, cooperative and no distress Head: Normocephalic, without obvious abnormality, atraumatic Neck: no adenopathy, no JVD, supple, symmetrical, trachea midline and thyroid not enlarged, symmetric, no tenderness/mass/nodules Lymph nodes: Cervical, supraclavicular, and axillary nodes normal. Resp: clear to auscultation bilaterally Back: symmetric, no curvature. ROM normal. No CVA tenderness. Cardio: regular rate and rhythm, S1, S2 normal, no murmur, click, rub or gallop GI: soft, non-tender; bowel sounds normal; no masses,  no organomegaly Extremities: extremities normal, atraumatic, no cyanosis or edema Neurologic: Alert and oriented X 3, normal strength and tone. Normal symmetric reflexes. Normal coordination and gait  ECOG PERFORMANCE STATUS: 1 - Symptomatic but completely ambulatory  Blood pressure 134/84, pulse 71, temperature 97.9 F (36.6 C), temperature source Oral, resp. rate 18, height _0  (1.727 m), weight 165 lb 12.8 oz (75.206 kg), SpO2 99.00%.  LABORATORY DATA: Lab Results  Component Value Date   WBC 5.6 09/15/2013   HGB 9.5* 09/15/2013   HCT 28.3* 09/15/2013   MCV 92.8 09/15/2013   PLT 244 09/15/2013      Chemistry      Component Value Date/Time   NA 141 08/25/2013 1000   NA 141 08/04/2013 1057   NA CANCELED 10/13/2012 1149   K 3.9 08/25/2013 1000   K 3.7 08/04/2013 1057   CL 102 08/25/2013 1000   CO2 24 08/25/2013 1000   CO2 25 08/04/2013 1057   BUN 11 08/25/2013 1000   BUN 13.9 08/04/2013 1057   BUN CANCELED 10/13/2012 1149   CREATININE 0.76 08/25/2013 1000   CREATININE 0.8 08/04/2013 1057      Component Value Date/Time   CALCIUM 9.4 08/25/2013 1000   CALCIUM 9.3 08/04/2013 1057    ALKPHOS 185* 08/25/2013 1000   ALKPHOS 178* 08/04/2013 1057   AST 31 08/25/2013 1000   AST 24 08/04/2013 1057   ALT 20 08/25/2013 1000   ALT 18 08/04/2013 1057   BILITOT 0.3 08/25/2013 1000   BILITOT 0.47 08/04/2013 1057       RADIOGRAPHIC STUDIES: Ct Chest W Contrast  09/11/2013   CLINICAL DATA:  Followup metastatic non-small cell lung carcinoma. Undergoing chemotherapy. Previous radiation therapy.  EXAM: CT CHEST, ABDOMEN, AND PELVIS WITH CONTRAST  TECHNIQUE: Multidetector CT imaging of the chest, abdomen and pelvis was performed following the standard protocol during bolus administration of intravenous contrast.  CONTRAST:  147m OMNIPAQUE IOHEXOL 300 MG/ML  SOLN  COMPARISON:  07/10/2013  FINDINGS:   CT CHEST FINDINGS  Mediastinum/Hilar Regions: No masses or pathologically enlarged lymph nodes identified.  Other Thoracic Lymphadenopathy:  None.  Lungs: A cavitary mass in the central infrahilar region of the right lower lobe measures 3.0 x 4.8 cm on image 34, which is stable in size when measured in the same planes on previous exam. No new or enlarging pulmonary masses  are identified. Radiation changes and chronic peripheral interstitial lung disease are stable in appearance.  Pleura: Small right pleural effusion or pleural thickening shows no significant change.  Vascular/Cardiac: No thoracic aortic aneurysm or other significant abnormality identified.  Musculoskeletal:  No suspicious bone lesions identified.  Other:  None.    CT ABDOMEN AND PELVIS FINDINGS  Liver:  No mass or other parenchymal abnormality identified.  Gallbladder/Biliary: Prior cholecystectomy noted. No evidence of biliary dilatation.  Pancreas: No mass, inflammatory changes, or other parenchymal abnormality identified.  Spleen:  Within normal limits in size and appearance.  Adrenal Glands:  No mass identified.  Kidneys/Urinary Tract: No masses identified. No evidence of hydronephrosis.  Lymph Nodes:  No pathologically enlarged lymph  nodes identified.  Bowel/Peritoneum: Mild diverticulosis is seen mainly in the sigmoid colon. No evidence of acute diverticulitis or other active inflammatory process.  Pelvic/Reproductive Organs: No mass or other significant abnormality identified.  Vascular:  No evidence of abdominal aortic aneurysm.  Musculoskeletal:  No suspicious bone lesions identified.  Other:  None.    IMPRESSION: No significant change in size of cavitary mass in the central right lower lobe.  No significant change in size of small right pleural effusion versus pleural thickening. Stable post radiation changes and chronic interstitial lung disease.  No new or progressive disease within the chest, abdomen, or pelvis.   Electronically Signed   By: Earle Gell M.D.   On: 09/11/2013 11:04   ASSESSMENT AND PLAN: this is a very pleasant 80 years old white male with metastatic non-small cell lung cancer, adenocarcinoma with brain metastasis status post stereotactic radiotherapy to the brain lesions followed by palliative radiotherapy to the left lower lobe lung mass. The patient is currently undergoing systemic chemotherapy with carboplatin and Alimta status post 6 cycles with stable disease and he is currently undergoing maintenance chemotherapy with single agent Alimta status post 6 cycles. He is tolerating the treatment well except for mild fatigue after his last treatment.  The recent CT scan of the chest, abdomen and pelvis showed no evidence for disease progression. I discussed the scan results and showed the images to the patient and his wife. I recommended for him to continue his current treatment with maintenance chemotherapy with single agent Alimta. He'll proceed with cycle #7 today and the patient would come back for followup visit in 3 weeks with the next cycle of his treatment. He was advised to call immediately if he has any concerning symptoms in the interval. The patient voices understanding of current disease status and  treatment options and is in agreement with the current care plan.  All questions were answered. The patient knows to call the clinic with any problems, questions or concerns. We can certainly see the patient much sooner if necessary.  Disclaimer: This note was dictated with voice recognition software. Similar sounding words can inadvertently be transcribed and may not be corrected upon review.

## 2013-09-25 ENCOUNTER — Other Ambulatory Visit: Payer: Medicare Other

## 2013-09-28 ENCOUNTER — Ambulatory Visit: Admission: RE | Admit: 2013-09-28 | Payer: Medicare Other | Source: Ambulatory Visit | Admitting: Radiation Oncology

## 2013-09-28 ENCOUNTER — Ambulatory Visit: Payer: Medicare Other

## 2013-10-02 ENCOUNTER — Ambulatory Visit
Admission: RE | Admit: 2013-10-02 | Discharge: 2013-10-02 | Disposition: A | Discharge status: Home / Self Care | Payer: Medicare Other | Source: Ambulatory Visit | Attending: Radiation Oncology | Admitting: Radiation Oncology

## 2013-10-02 DIAGNOSIS — C7931 Secondary malignant neoplasm of brain: Secondary | ICD-10-CM

## 2013-10-02 DIAGNOSIS — C7949 Secondary malignant neoplasm of other parts of nervous system: Principal | ICD-10-CM

## 2013-10-02 MED ORDER — GADOBENATE DIMEGLUMINE 529 MG/ML IV SOLN
15.0000 mL | Freq: Once | INTRAVENOUS | Status: AC | PRN
  Administered 2013-10-02: 15 mL via INTRAVENOUS

## 2013-10-06 ENCOUNTER — Ambulatory Visit (HOSPITAL_BASED_OUTPATIENT_CLINIC_OR_DEPARTMENT_OTHER): Payer: Medicare Other

## 2013-10-06 ENCOUNTER — Other Ambulatory Visit (HOSPITAL_BASED_OUTPATIENT_CLINIC_OR_DEPARTMENT_OTHER): Payer: Medicare Other

## 2013-10-06 ENCOUNTER — Encounter: Payer: Self-pay | Admitting: Physician Assistant

## 2013-10-06 ENCOUNTER — Other Ambulatory Visit: Payer: Medicare Other

## 2013-10-06 ENCOUNTER — Ambulatory Visit (HOSPITAL_BASED_OUTPATIENT_CLINIC_OR_DEPARTMENT_OTHER): Payer: Medicare Other | Admitting: Physician Assistant

## 2013-10-06 ENCOUNTER — Ambulatory Visit: Payer: Medicare Other

## 2013-10-06 VITALS — BP 136/82 | HR 67 | Temp 97.9°F | Resp 18 | Ht 68.0 in | Wt 170.7 lb

## 2013-10-06 DIAGNOSIS — C3431 Malignant neoplasm of lower lobe, right bronchus or lung: Secondary | ICD-10-CM

## 2013-10-06 DIAGNOSIS — C3430 Malignant neoplasm of lower lobe, unspecified bronchus or lung: Secondary | ICD-10-CM

## 2013-10-06 DIAGNOSIS — Z23 Encounter for immunization: Secondary | ICD-10-CM

## 2013-10-06 DIAGNOSIS — C7931 Secondary malignant neoplasm of brain: Secondary | ICD-10-CM

## 2013-10-06 DIAGNOSIS — Z5111 Encounter for antineoplastic chemotherapy: Secondary | ICD-10-CM

## 2013-10-06 DIAGNOSIS — C7949 Secondary malignant neoplasm of other parts of nervous system: Secondary | ICD-10-CM

## 2013-10-06 DIAGNOSIS — C3490 Malignant neoplasm of unspecified part of unspecified bronchus or lung: Secondary | ICD-10-CM

## 2013-10-06 LAB — COMPREHENSIVE METABOLIC PANEL (CC13)
ALK PHOS: 200 U/L — AB (ref 40–150)
ALT: 18 U/L (ref 0–55)
AST: 29 U/L (ref 5–34)
Albumin: 3.3 g/dL — ABNORMAL LOW (ref 3.5–5.0)
Anion Gap: 9 mEq/L (ref 3–11)
BUN: 10.7 mg/dL (ref 7.0–26.0)
CO2: 26 mEq/L (ref 22–29)
Calcium: 9 mg/dL (ref 8.4–10.4)
Chloride: 107 mEq/L (ref 98–109)
Creatinine: 0.9 mg/dL (ref 0.7–1.3)
Glucose: 159 mg/dl — ABNORMAL HIGH (ref 70–140)
Potassium: 3.2 mEq/L — ABNORMAL LOW (ref 3.5–5.1)
Sodium: 142 mEq/L (ref 136–145)
Total Bilirubin: 0.57 mg/dL (ref 0.20–1.20)
Total Protein: 6.7 g/dL (ref 6.4–8.3)

## 2013-10-06 LAB — CBC WITH DIFFERENTIAL/PLATELET
BASO%: 0.2 % (ref 0.0–2.0)
BASOS ABS: 0 10*3/uL (ref 0.0–0.1)
EOS%: 0 % (ref 0.0–7.0)
Eosinophils Absolute: 0 10*3/uL (ref 0.0–0.5)
HCT: 27.7 % — ABNORMAL LOW (ref 38.4–49.9)
HEMOGLOBIN: 9.3 g/dL — AB (ref 13.0–17.1)
LYMPH%: 5.6 % — AB (ref 14.0–49.0)
MCH: 32.1 pg (ref 27.2–33.4)
MCHC: 33.7 g/dL (ref 32.0–36.0)
MCV: 95.3 fL (ref 79.3–98.0)
MONO#: 0.2 10*3/uL (ref 0.1–0.9)
MONO%: 4.2 % (ref 0.0–14.0)
NEUT#: 5.3 10*3/uL (ref 1.5–6.5)
NEUT%: 90 % — AB (ref 39.0–75.0)
Platelets: 260 10*3/uL (ref 140–400)
RBC: 2.9 10*6/uL — ABNORMAL LOW (ref 4.20–5.82)
RDW: 15.8 % — ABNORMAL HIGH (ref 11.0–14.6)
WBC: 5.9 10*3/uL (ref 4.0–10.3)
lymph#: 0.3 10*3/uL — ABNORMAL LOW (ref 0.9–3.3)

## 2013-10-06 MED ORDER — DEXAMETHASONE SODIUM PHOSPHATE 10 MG/ML IJ SOLN
INTRAMUSCULAR | Status: AC
  Filled 2013-10-06: qty 1

## 2013-10-06 MED ORDER — ONDANSETRON 8 MG/NS 50 ML IVPB
INTRAVENOUS | Status: AC
  Filled 2013-10-06: qty 8

## 2013-10-06 MED ORDER — ONDANSETRON 8 MG/50ML IVPB (CHCC)
8.0000 mg | Freq: Once | INTRAVENOUS | Status: AC
  Administered 2013-10-06: 8 mg via INTRAVENOUS

## 2013-10-06 MED ORDER — INFLUENZA VAC SPLIT QUAD 0.5 ML IM SUSY
0.5000 mL | PREFILLED_SYRINGE | Freq: Once | INTRAMUSCULAR | Status: AC
  Administered 2013-10-06: 0.5 mL via INTRAMUSCULAR
  Filled 2013-10-06: qty 0.5

## 2013-10-06 MED ORDER — DEXAMETHASONE SODIUM PHOSPHATE 10 MG/ML IJ SOLN
10.0000 mg | Freq: Once | INTRAMUSCULAR | Status: AC
  Administered 2013-10-06: 10 mg via INTRAVENOUS

## 2013-10-06 MED ORDER — SODIUM CHLORIDE 0.9 % IV SOLN
Freq: Once | INTRAVENOUS | Status: AC
  Administered 2013-10-06: 16:00:00 via INTRAVENOUS

## 2013-10-06 MED ORDER — PEMETREXED DISODIUM CHEMO INJECTION 500 MG
500.0000 mg/m2 | Freq: Once | INTRAVENOUS | Status: AC
  Administered 2013-10-06: 975 mg via INTRAVENOUS
  Filled 2013-10-06: qty 39

## 2013-10-06 NOTE — Progress Notes (Addendum)
[facility] [phone]   [phone]  SHARED VISIT PROGRESS NOTE  [name], MD [place] [address]  DIAGNOSIS: Stage IV ( T2b, N2, M1b) non-small cell lung cancer, adenocarcinoma with areas of squamous differentiation with negative EGFR mutation and negative ALK gene translocation, presented with left lower lobe lung mass in addition to mediastinal lymphadenopathy and brain metastases diagnosed in October of 2014  PRIOR THERAPY: 1) Stereotactic radiotherapy to 3 brain lesions under the care of Dr. Lisbeth Renshaw completed 12/08/2012. 2) Palliative radiotherapy to the left lower lobe lung mass under the care of Dr. Lisbeth Renshaw expected to be completed 01/01/2013. 3) Systemic chemotherapy with carboplatin for AUC of 5 and Alimta 500 mg/M2 every 3 weeks. First dose 12/23/2012. Status post 6 cycles  CURRENT THERAPY: Maintenance chemotherapy with single agent Alimta at 500 mg per meter squared given every 3 weeks. First cycle expected to be given 05/12/2013. Status post 7 cycles.   CHEMOTHERAPY INTENT: Palliative  CURRENT # OF CHEMOTHERAPY CYCLES: 8  CURRENT ANTIEMETICS:Zofran, dexamethasone and Compazine  CURRENT SMOKING STATUS: former smoker  ORAL CHEMOTHERAPY AND CONSENT: None  CURRENT BISPHOSPHONATES USE: None  PAIN MANAGEMENT: 0/10  NARCOTICS INDUCED CONSTIPATION: None  LIVING WILL AND CODE STATUS: ?  INTERVAL HISTORY: AXZEL ROCKHILL 80 y.o. male returns to the clinic today for follow up visit accompanied by his wife. He has been on maintenance chemotherapy with single agent Alimta for the last 7 cycles. He tolerated the last cycle of his treatment fairly well except for mild fatigue.  The patient denied having any nausea or vomiting. He denied having any fever or chills. He has no chest pain, shortness of breath, cough or hemoptysis. He has no weight loss or night sweats. He presents to proceed with cycle #8.  MEDICAL  HISTORY: Past Medical History  Diagnosis Date  . COPD (chronic obstructive pulmonary disease)   . Allergy     allergic rhinitis  . Hx of radiation therapy 12/09/12-01/01/13    lung 37.5Gy  . nscl ca w/ brain mets dx'd 08/2012    Patient has a lung mass which is being evaluated    ALLERGIES:  is allergic to sulfa antibiotics.  MEDICATIONS:  Current Outpatient Prescriptions  Medication Sig Dispense Refill  . acetaminophen (TYLENOL) 500 MG tablet Take 500 mg by mouth every 6 (six) hours as needed for mild pain.       . clobetasol cream (TEMOVATE) 5.72 % Apply 1 application topically daily as needed (for itching).      Marland Kitchen dexamethasone (DECADRON) 4 MG tablet 4 mg by mouth twice a day the day before, day of and day after the chemotherapy every 3 weeks  40 tablet  1  . ferrous sulfate (IRON SUPPLEMENT) 325 (65 FE) MG tablet Take 325 mg by mouth daily with breakfast.      . folic acid (FOLVITE) 620 MCG tablet Take 0.5 tablets (400 mcg total) by mouth daily.  30 tablet  3  . Polyethyl Glycol-Propyl Glycol (SYSTANE OP) Place 1 drop into both eyes daily.      . simvastatin (ZOCOR) 10 MG tablet Take 1 tablet (10 mg total) by mouth at bedtime.  90 tablet  3  . loratadine (ALLERGY RELIEF) 10 MG tablet Take 10 mg by mouth daily as needed for allergies.       Marland Kitchen ondansetron (ZOFRAN) 4 MG tablet Take 1 tablet (4 mg total) by mouth every 6 (six) hours.  20 tablet  0  . prochlorperazine (COMPAZINE) 10 MG tablet Take 1 tablet (10 mg total) by mouth every 6 (six) hours as needed for nausea or vomiting.  60 tablet  0   No current facility-administered medications for this visit.    SURGICAL HISTORY:  Past Surgical History  Procedure Laterality Date  . Cholecystectomy      REVIEW OF SYSTEMS:  Constitutional: negative Eyes: negative Ears, nose, mouth, throat, and face: negative Respiratory: negative Cardiovascular: negative Gastrointestinal: negative Genitourinary:negative Integument/breast:  negative Hematologic/lymphatic: negative Musculoskeletal:negative Neurological: negative Behavioral/Psych: negative Endocrine: negative Allergic/Immunologic: negative   PHYSICAL EXAMINATION: General appearance: alert, cooperative and no distress Head: Normocephalic, without obvious abnormality, atraumatic Neck: no adenopathy, no JVD, supple, symmetrical, trachea midline and thyroid not enlarged, symmetric, no tenderness/mass/nodules Lymph nodes: Cervical, supraclavicular, and axillary nodes normal. Resp: clear to auscultation bilaterally Back: symmetric, no curvature. ROM normal. No CVA tenderness. Cardio: regular rate and rhythm, S1, S2 normal, no murmur, click, rub or gallop GI: soft, non-tender; bowel sounds normal; no masses,  no organomegaly Extremities: extremities normal, atraumatic, no cyanosis or edema Neurologic: Alert and oriented X 3, normal strength and tone. Normal symmetric reflexes. Normal coordination and gait  ECOG PERFORMANCE STATUS: 1 - Symptomatic but completely ambulatory  Blood pressure 136/82, pulse 67, temperature 97.9 F (36.6 C), temperature source Oral, resp. rate 18, height 5' 8" (1.727 m), weight 170 lb 11.2 oz (77.429 kg).  LABORATORY DATA: Lab Results  Component Value Date   WBC 5.9 10/06/2013   HGB 9.3* 10/06/2013   HCT 27.7* 10/06/2013   MCV 95.3 10/06/2013   PLT 260 10/06/2013      Chemistry      Component Value Date/Time   NA 142 10/06/2013 1403   NA 141 08/25/2013 1000   NA CANCELED 10/13/2012 1149   K 3.2* 10/06/2013 1403   K 3.9 08/25/2013 1000   CL 102 08/25/2013 1000   CO2 26 10/06/2013 1403   CO2 24 08/25/2013 1000   BUN 10.7 10/06/2013 1403   BUN 11 08/25/2013 1000   BUN CANCELED 10/13/2012 1149   CREATININE 0.9 10/06/2013 1403   CREATININE 0.76 08/25/2013 1000      Component Value Date/Time   CALCIUM 9.0 10/06/2013 1403   CALCIUM 9.4 08/25/2013 1000   ALKPHOS 200* 10/06/2013 1403   ALKPHOS 185* 08/25/2013 1000   AST 29 10/06/2013 1403    AST 31 08/25/2013 1000   ALT 18 10/06/2013 1403   ALT 20 08/25/2013 1000   BILITOT 0.57 10/06/2013 1403   BILITOT 0.3 08/25/2013 1000       RADIOGRAPHIC STUDIES: Ct Chest W Contrast  09/11/2013   CLINICAL DATA:  Followup metastatic non-small cell lung carcinoma. Undergoing chemotherapy. Previous radiation therapy.  EXAM: CT CHEST, ABDOMEN, AND PELVIS WITH CONTRAST  TECHNIQUE: Multidetector CT imaging of the chest, abdomen and pelvis was performed following the standard protocol during bolus administration of intravenous contrast.  CONTRAST:  117m OMNIPAQUE IOHEXOL 300 MG/ML  SOLN  COMPARISON:  07/10/2013  FINDINGS:   CT CHEST FINDINGS  Mediastinum/Hilar Regions: No masses or pathologically enlarged lymph nodes identified.  Other Thoracic Lymphadenopathy:  None.  Lungs: A cavitary mass in the central infrahilar region of the right lower lobe measures 3.0 x 4.8 cm on image 34, which is stable in size when measured in the same planes on previous exam. No new or enlarging pulmonary masses are identified. Radiation changes and chronic peripheral interstitial lung disease are stable in appearance.  Pleura: Small right  pleural effusion or pleural thickening shows no significant change.  Vascular/Cardiac: No thoracic aortic aneurysm or other significant abnormality identified.  Musculoskeletal:  No suspicious bone lesions identified.  Other:  None.    CT ABDOMEN AND PELVIS FINDINGS  Liver:  No mass or other parenchymal abnormality identified.  Gallbladder/Biliary: Prior cholecystectomy noted. No evidence of biliary dilatation.  Pancreas: No mass, inflammatory changes, or other parenchymal abnormality identified.  Spleen:  Within normal limits in size and appearance.  Adrenal Glands:  No mass identified.  Kidneys/Urinary Tract: No masses identified. No evidence of hydronephrosis.  Lymph Nodes:  No pathologically enlarged lymph nodes identified.  Bowel/Peritoneum: Mild diverticulosis is seen mainly in the sigmoid  colon. No evidence of acute diverticulitis or other active inflammatory process.  Pelvic/Reproductive Organs: No mass or other significant abnormality identified.  Vascular:  No evidence of abdominal aortic aneurysm.  Musculoskeletal:  No suspicious bone lesions identified.  Other:  None.    IMPRESSION: No significant change in size of cavitary mass in the central right lower lobe.  No significant change in size of small right pleural effusion versus pleural thickening. Stable post radiation changes and chronic interstitial lung disease.  No new or progressive disease within the chest, abdomen, or pelvis.   Electronically Signed   By: Earle Gell M.D.   On: 09/11/2013 11:04   ASSESSMENT AND PLAN: this is a very pleasant 80 years old white male with metastatic non-small cell lung cancer, adenocarcinoma with brain metastasis status post stereotactic radiotherapy to the brain lesions followed by palliative radiotherapy to the left lower lobe lung mass. The patient is currently undergoing systemic chemotherapy with carboplatin and Alimta status post 6 cycles with stable disease and he is currently undergoing maintenance chemotherapy with single agent Alimta status post 7 cycles. He is tolerating the treatment well except for mild fatigue after his last treatment.  The recent CT scan of the chest, abdomen and pelvis showed no evidence for disease progression patient was discussed with and also seen by Dr. Julien Nordmann. He will proceed with cycle # 8 of maintenance Alimta today as scheduled. He will follow up in 3 weeks prior to cycle # 9. He was advised to call immediately if he has any concerning symptoms in the interval. The patient voices understanding of current disease status and treatment options and is in agreement with the current care plan.  All questions were answered. The patient knows to call the clinic with any problems, questions or concerns. We can certainly see the patient much sooner if necessary.    Carlton Adam PA-C  ADDENDUM: Hematology/Oncology Attending:  I had a face to face encounter with the patient. I recommended to his care plan. This is a very pleasant 80 years old white male with metastatic non-small cell lung cancer, adenocarcinoma status post induction chemotherapy with carboplatin and Alimta and currently on maintenance treatment with single agent Alimta status post 7 cycles. He is tolerating his treatment fairly well except for mild fatigue that last few days after the treatment.  I recommended for the patient to proceed with cycle #8 today as scheduled. He would come back for followup visit in 3 weeks with the start of the next cycle of his treatment.  He was advised to call immediately if he has any concerning symptoms in the interval.  Disclaimer: This note was dictated with voice recognition software. Similar sounding words can inadvertently be transcribed and may not be corrected upon review. Eilleen Kempf., MD 10/12/2013

## 2013-10-06 NOTE — Patient Instructions (Signed)
[facility] Discharge Instructions for Patients Receiving Chemotherapy  Today you received the following chemotherapy agents: Alimta  To help prevent nausea and vomiting after your treatment, we encourage you to take your nausea medication:Zofran 8 mg every 12 hours and compazine 10mg  every 6 hours.   If you develop nausea and vomiting that is not controlled by your nausea medication, call the clinic.   BELOW ARE SYMPTOMS THAT SHOULD BE REPORTED IMMEDIATELY:  *FEVER GREATER THAN 100.5 F  *CHILLS WITH OR WITHOUT FEVER  NAUSEA AND VOMITING THAT IS NOT CONTROLLED WITH YOUR NAUSEA MEDICATION  *UNUSUAL SHORTNESS OF BREATH  *UNUSUAL BRUISING OR BLEEDING  TENDERNESS IN MOUTH AND THROAT WITH OR WITHOUT PRESENCE OF ULCERS  *URINARY PROBLEMS  *BOWEL PROBLEMS  UNUSUAL RASH Items with * indicate a potential emergency and should be followed up as soon as possible.  Feel free to call the clinic you have any questions or concerns. The clinic phone number is [phone].

## 2013-10-07 ENCOUNTER — Encounter: Payer: Self-pay | Admitting: Radiation Oncology

## 2013-10-07 ENCOUNTER — Ambulatory Visit
Admission: RE | Admit: 2013-10-07 | Discharge: 2013-10-07 | Disposition: A | Discharge status: Home / Self Care | Payer: Medicare Other | Source: Ambulatory Visit | Attending: Radiation Oncology | Admitting: Radiation Oncology

## 2013-10-07 VITALS — BP 134/67 | HR 65 | Temp 97.7°F | Resp 20 | Ht 71.0 in | Wt 170.3 lb

## 2013-10-07 DIAGNOSIS — C7931 Secondary malignant neoplasm of brain: Secondary | ICD-10-CM

## 2013-10-07 DIAGNOSIS — C3430 Malignant neoplasm of lower lobe, unspecified bronchus or lung: Secondary | ICD-10-CM

## 2013-10-07 NOTE — Progress Notes (Signed)
Follow up here today for results MRI Brain 10/02/13, s/p SRS Brain 12/08/12, no nausea, no head aches,pain, no blurred vision, had Almita infusion yesterday in med/[facility], will get q 3 weeks, saw Dr. Julien Nordmann and Awilda Metro yesterday , appetite okay today, energy level good at present,but gets fatigued soon after chemotherapy  1:44 PM

## 2013-10-07 NOTE — Progress Notes (Signed)
Radiation Oncology         [phone] ________________________________  Name: [name] MRN: [id number]  Date: 10/07/2013  DOB: 1933/10/09  Follow-Up Visit Note  CC: [name], MD  [name], MD  Diagnosis:      Brain metastasis   12/02/2012 Initial Diagnosis Brain metastasis   12/08/2012 - 12/08/2012 Radiation Therapy Radiosurgery to 3 intracranial metastases    Malignant neoplasm of lower lobe, bronchus, or lung (Resolved)   12/02/2012 Initial Diagnosis Malignant neoplasm of lower lobe, bronchus, or lung   12/09/2012 - 01/01/2013 Radiation Therapy Palliative radiation treatment to the right lung, 37.5 gray      Narrative:  The patient returns today for routine follow-up.  The patient states that she is doing well. No headache and no nausea recently. With regards to systemic treatment, the patient continues to do well with Alimta chemotherapy. The patient denies any worsening shortness of breath.  The patient underwent a recent MRI scan of the brain. This has been reviewed in multidisciplinary brain conference. No signs of recurrent or new disease seen. I have personally reviewed this MRI scan.                              ALLERGIES:  is allergic to sulfa antibiotics.  Meds: Current Outpatient Prescriptions  Medication Sig Dispense Refill  . acetaminophen (TYLENOL) 500 MG tablet Take 500 mg by mouth every 6 (six) hours as needed for mild pain.       . clobetasol cream (TEMOVATE) 2.70 % Apply 1 application topically daily as needed (for itching).      Marland Kitchen dexamethasone (DECADRON) 4 MG tablet 4 mg by mouth twice a day the day before, day of and day after the chemotherapy every 3 weeks  40 tablet  1  . ferrous sulfate (IRON SUPPLEMENT) 325 (65 FE) MG tablet Take 325 mg by mouth daily with breakfast.      . folic acid (FOLVITE) 623 MCG tablet Take 0.5 tablets (400 mcg total) by mouth daily.  30 tablet  3  . loratadine (ALLERGY RELIEF) 10 MG tablet Take 10 mg by mouth  daily as needed for allergies.       Vladimir Faster Glycol-Propyl Glycol (SYSTANE OP) Place 1 drop into both eyes daily.      . simvastatin (ZOCOR) 10 MG tablet Take 1 tablet (10 mg total) by mouth at bedtime.  90 tablet  3  . ondansetron (ZOFRAN) 4 MG tablet Take 1 tablet (4 mg total) by mouth every 6 (six) hours.  20 tablet  0  . prochlorperazine (COMPAZINE) 10 MG tablet Take 1 tablet (10 mg total) by mouth every 6 (six) hours as needed for nausea or vomiting.  60 tablet  0   No current facility-administered medications for this encounter.    Physical Findings: The patient is in no acute distress. Patient is alert and oriented.  height is 5\' 11"  (1.803 m) and weight is 170 lb 4.8 oz (77.248 kg). His temperature is 97.7 F (36.5 C). His blood pressure is 134/67 and his pulse is 65. His respiration is 20. .     Lab Findings: Lab Results  Component Value Date   WBC 5.9 10/06/2013   HGB 9.3* 10/06/2013   HCT 27.7* 10/06/2013   MCV 95.3 10/06/2013   PLT 260 10/06/2013     Radiographic Findings: Ct Chest W Contrast  09/11/2013   CLINICAL DATA:  Followup  metastatic non-small cell lung carcinoma. Undergoing chemotherapy. Previous radiation therapy.  EXAM: CT CHEST, ABDOMEN, AND PELVIS WITH CONTRAST  TECHNIQUE: Multidetector CT imaging of the chest, abdomen and pelvis was performed following the standard protocol during bolus administration of intravenous contrast.  CONTRAST:  126mL OMNIPAQUE IOHEXOL 300 MG/ML  SOLN  COMPARISON:  07/10/2013  FINDINGS: CT CHEST FINDINGS  Mediastinum/Hilar Regions: No masses or pathologically enlarged lymph nodes identified.  Other Thoracic Lymphadenopathy:  None.  Lungs: A cavitary mass in the central infrahilar region of the right lower lobe measures 3.0 x 4.8 cm on image 34, which is stable in size when measured in the same planes on previous exam. No new or enlarging pulmonary masses are identified. Radiation changes and chronic peripheral interstitial lung disease  are stable in appearance.  Pleura: Small right pleural effusion or pleural thickening shows no significant change.  Vascular/Cardiac: No thoracic aortic aneurysm or other significant abnormality identified.  Musculoskeletal:  No suspicious bone lesions identified.  Other:  None.  CT ABDOMEN AND PELVIS FINDINGS  Liver:  No mass or other parenchymal abnormality identified.  Gallbladder/Biliary: Prior cholecystectomy noted. No evidence of biliary dilatation.  Pancreas: No mass, inflammatory changes, or other parenchymal abnormality identified.  Spleen:  Within normal limits in size and appearance.  Adrenal Glands:  No mass identified.  Kidneys/Urinary Tract: No masses identified. No evidence of hydronephrosis.  Lymph Nodes:  No pathologically enlarged lymph nodes identified.  Bowel/Peritoneum: Mild diverticulosis is seen mainly in the sigmoid colon. No evidence of acute diverticulitis or other active inflammatory process.  Pelvic/Reproductive Organs: No mass or other significant abnormality identified.  Vascular:  No evidence of abdominal aortic aneurysm.  Musculoskeletal:  No suspicious bone lesions identified.  Other:  None.  IMPRESSION: No significant change in size of cavitary mass in the central right lower lobe.  No significant change in size of small right pleural effusion versus pleural thickening. Stable post radiation changes and chronic interstitial lung disease.  No new or progressive disease within the chest, abdomen, or pelvis.   Electronically Signed   By: Earle Gell M.D.   On: 09/11/2013 11:04   Mr [name] LN Contrast  10/02/2013   CLINICAL DATA:  SRS restaging.  Metastatic lung cancer.  EXAM: MRI HEAD WITHOUT AND WITH CONTRAST  TECHNIQUE: Multiplanar, multiecho pulse sequences of the brain and surrounding structures were obtained without and with intravenous contrast.  CONTRAST:  87mL MULTIHANCE GADOBENATE DIMEGLUMINE 529 MG/ML IV SOLN  COMPARISON:  06/19/2013  FINDINGS: There is no new evidence of  acute infarct, intracranial hemorrhage, midline shift, or extra-axial fluid collection. Moderate cerebral atrophy is unchanged. Scattered foci of T2 hyperintensity throughout the subcortical and deep cerebral white matter bilaterally are nonspecific but compatible with mild-to-moderate chronic small vessel ischemic disease.  Ring-enhancing lesion in the right cerebellum has not significantly changed, measuring 12 x 7 mm (series 10, image 40). 4 mm anterior right parietal enhancing lesion is unchanged (series 10, image 109). 5 mm ring-enhancing medial left occipital lesion is unchanged (series 10, image 69). No new lesions are identified.  Orbits are unremarkable. There is minimal bilateral ethmoid air cell mucosal thickening. Mastoid air cells are clear. Major intracranial vascular flow voids are preserved.  IMPRESSION: Unchanged appearance of 3 small metastases.  No new lesions.   Electronically Signed   By: Logan Bores   On: 10/02/2013 16:01   Ct Abdomen Pelvis W Contrast  09/11/2013   CLINICAL DATA:  Followup metastatic non-small cell lung carcinoma. Undergoing  chemotherapy. Previous radiation therapy.  EXAM: CT CHEST, ABDOMEN, AND PELVIS WITH CONTRAST  TECHNIQUE: Multidetector CT imaging of the chest, abdomen and pelvis was performed following the standard protocol during bolus administration of intravenous contrast.  CONTRAST:  113mL OMNIPAQUE IOHEXOL 300 MG/ML  SOLN  COMPARISON:  07/10/2013  FINDINGS: CT CHEST FINDINGS  Mediastinum/Hilar Regions: No masses or pathologically enlarged lymph nodes identified.  Other Thoracic Lymphadenopathy:  None.  Lungs: A cavitary mass in the central infrahilar region of the right lower lobe measures 3.0 x 4.8 cm on image 34, which is stable in size when measured in the same planes on previous exam. No new or enlarging pulmonary masses are identified. Radiation changes and chronic peripheral interstitial lung disease are stable in appearance.  Pleura: Small right pleural  effusion or pleural thickening shows no significant change.  Vascular/Cardiac: No thoracic aortic aneurysm or other significant abnormality identified.  Musculoskeletal:  No suspicious bone lesions identified.  Other:  None.  CT ABDOMEN AND PELVIS FINDINGS  Liver:  No mass or other parenchymal abnormality identified.  Gallbladder/Biliary: Prior cholecystectomy noted. No evidence of biliary dilatation.  Pancreas: No mass, inflammatory changes, or other parenchymal abnormality identified.  Spleen:  Within normal limits in size and appearance.  Adrenal Glands:  No mass identified.  Kidneys/Urinary Tract: No masses identified. No evidence of hydronephrosis.  Lymph Nodes:  No pathologically enlarged lymph nodes identified.  Bowel/Peritoneum: Mild diverticulosis is seen mainly in the sigmoid colon. No evidence of acute diverticulitis or other active inflammatory process.  Pelvic/Reproductive Organs: No mass or other significant abnormality identified.  Vascular:  No evidence of abdominal aortic aneurysm.  Musculoskeletal:  No suspicious bone lesions identified.  Other:  None.  IMPRESSION: No significant change in size of cavitary mass in the central right lower lobe.  No significant change in size of small right pleural effusion versus pleural thickening. Stable post radiation changes and chronic interstitial lung disease.  No new or progressive disease within the chest, abdomen, or pelvis.   Electronically Signed   By: Earle Gell M.D.   On: 09/11/2013 11:04    Impression:    The patient is doing well clinically. The patient's brain MRI scan looked good - no sign of progression. We will continue standard followup through the brain program.  Plan:  The patient will undergo a repeat MRI scan of the brain in 3 months.  I spent 15 minutes with the patient today, the majority of which was spent counseling the patient on the diagnosis of cancer and coordinating care.   Jodelle Gross, M.D., Ph.D.

## 2013-10-11 NOTE — Patient Instructions (Signed)
Follow-up in 3 weeks

## 2013-10-12 ENCOUNTER — Ambulatory Visit (INDEPENDENT_AMBULATORY_CARE_PROVIDER_SITE_OTHER): Payer: Medicare Other | Admitting: Family

## 2013-10-12 ENCOUNTER — Encounter: Payer: Self-pay | Admitting: Family

## 2013-10-12 VITALS — BP 130/75 | HR 110 | Temp 97.5°F | Ht 71.0 in | Wt 160.6 lb

## 2013-10-12 DIAGNOSIS — J069 Acute upper respiratory infection, unspecified: Secondary | ICD-10-CM

## 2013-10-12 MED ORDER — BENZONATATE 200 MG PO CAPS: 200.0000 mg | ORAL_CAPSULE | Freq: Three times a day (TID) | ORAL | Status: DC | PRN

## 2013-10-12 NOTE — Patient Instructions (Signed)
Upper Respiratory Infection, Adult An upper respiratory infection (URI) is also sometimes known as the common cold. The upper respiratory tract includes the nose, sinuses, throat, trachea, and bronchi. Bronchi are the airways leading to the lungs. Most people improve within 1 week, but symptoms can last up to 2 weeks. A residual cough may last even longer.  CAUSES Many different viruses can infect the tissues lining the upper respiratory tract. The tissues become irritated and inflamed and often become very moist. Mucus production is also common. A cold is contagious. You can easily spread the virus to others by oral contact. This includes kissing, sharing a glass, coughing, or sneezing. Touching your mouth or nose and then touching a surface, which is then touched by another person, can also spread the virus. SYMPTOMS  Symptoms typically develop 1 to 3 days after you come in contact with a cold virus. Symptoms vary from person to person. They may include:  Runny nose.  Sneezing.  Nasal congestion.  Sinus irritation.  Sore throat.  Loss of voice (laryngitis).  Cough.  Fatigue.  Muscle aches.  Loss of appetite.  Headache.  Low-grade fever. DIAGNOSIS  You might diagnose your own cold based on familiar symptoms, since most people get a cold 2 to 3 times a year. Your caregiver can confirm this based on your exam. Most importantly, your caregiver can check that your symptoms are not due to another disease such as strep throat, sinusitis, pneumonia, asthma, or epiglottitis. Blood tests, throat tests, and X-rays are not necessary to diagnose a common cold, but they may sometimes be helpful in excluding other more serious diseases. Your caregiver will decide if any further tests are required. RISKS AND COMPLICATIONS  You may be at risk for a more severe case of the common cold if you smoke cigarettes, have chronic heart disease (such as heart failure) or lung disease (such as asthma), or if  you have a weakened immune system. The very young and very old are also at risk for more serious infections. Bacterial sinusitis, middle ear infections, and bacterial pneumonia can complicate the common cold. The common cold can worsen asthma and chronic obstructive pulmonary disease (COPD). Sometimes, these complications can require emergency medical care and may be life-threatening. PREVENTION  The best way to protect against getting a cold is to practice good hygiene. Avoid oral or hand contact with people with cold symptoms. Wash your hands often if contact occurs. There is no clear evidence that vitamin C, vitamin E, echinacea, or exercise reduces the chance of developing a cold. However, it is always recommended to get plenty of rest and practice good nutrition. TREATMENT  Treatment is directed at relieving symptoms. There is no cure. Antibiotics are not effective, because the infection is caused by a virus, not by bacteria. Treatment may include:  Increased fluid intake. Sports drinks offer valuable electrolytes, sugars, and fluids.  Breathing heated mist or steam (vaporizer or shower).  Eating chicken soup or other clear broths, and maintaining good nutrition.  Getting plenty of rest.  Using gargles or lozenges for comfort.  Controlling fevers with ibuprofen or acetaminophen as directed by your caregiver.  Increasing usage of your inhaler if you have asthma. Zinc gel and zinc lozenges, taken in the first 24 hours of the common cold, can shorten the duration and lessen the severity of symptoms. Pain medicines may help with fever, muscle aches, and throat pain. A variety of non-prescription medicines are available to treat congestion and runny nose. Your caregiver   can make recommendations and may suggest nasal or lung inhalers for other symptoms.  HOME CARE INSTRUCTIONS   Only take over-the-counter or prescription medicines for pain, discomfort, or fever as directed by your  caregiver.  Use a warm mist humidifier or inhale steam from a shower to increase air moisture. This may keep secretions moist and make it easier to breathe.  Drink enough water and fluids to keep your urine clear or pale yellow.  Rest as needed.  Return to work when your temperature has returned to normal or as your caregiver advises. You may need to stay home longer to avoid infecting others. You can also use a face mask and careful hand washing to prevent spread of the virus. SEEK MEDICAL CARE IF:   After the first few days, you feel you are getting worse rather than better.  You need your caregiver's advice about medicines to control symptoms.  You develop chills, worsening shortness of breath, or brown or red sputum. These may be signs of pneumonia.  You develop yellow or brown nasal discharge or pain in the face, especially when you bend forward. These may be signs of sinusitis.  You develop a fever, swollen neck glands, pain with swallowing, or white areas in the back of your throat. These may be signs of strep throat. SEEK IMMEDIATE MEDICAL CARE IF:   You have a fever.  You develop severe or persistent headache, ear pain, sinus pain, or chest pain.  You develop wheezing, a prolonged cough, cough up blood, or have a change in your usual mucus (if you have chronic lung disease).  You develop sore muscles or a stiff neck. Document Released: 06/27/2000 Document Revised: 03/26/2011 Document Reviewed: 04/08/2013 [employer] Patient Information 2015 [employer]. This information is not intended to replace advice given to you by your health care provider. Make sure you discuss any questions you have with your health care provider.  - Take meds as prescribed - Use a cool mist humidifier  -Use saline nose sprays frequently -Saline irrigations of the nose can be very helpful if done frequently.  * 4X daily for 1 week*  * Use of a nettie pot can be helpful with this. Follow  directions with this* -Force fluids -For any cough or congestion  Use plain Mucinex- regular strength or max strength is fine   * Children- consult with Pharmacist for dosing -For fever or aces or pains- take tylenol or ibuprofen appropriate for age and weight.  * for fevers greater than 101 orally you may alternate ibuprofen and tylenol every  3 hours. -Throat lozenges if help   Ernestene Coover, FNP   

## 2013-10-12 NOTE — Progress Notes (Signed)
   Subjective:    Patient ID: [name], male    DOB: 01-Sep-1933, 80 y.o.   MRN: [id number]  URI  This is a new problem. The current episode started yesterday (Saturday). The problem has been unchanged. There has been no fever. Associated symptoms include coughing, rhinorrhea and sneezing. Pertinent negatives include no chest pain, congestion, diarrhea, ear pain, headaches, plugged ear sensation, sinus pain, sore throat or wheezing. He has tried nothing for the symptoms. The treatment provided no relief.      Review of Systems  Constitutional: Negative.   HENT: Positive for rhinorrhea and sneezing. Negative for congestion, ear pain and sore throat.   Respiratory: Positive for cough. Negative for wheezing.   Cardiovascular: Negative.  Negative for chest pain.  Gastrointestinal: Negative.  Negative for diarrhea.  Endocrine: Negative.   Genitourinary: Negative.   Musculoskeletal: Negative.   Neurological: Negative.  Negative for headaches.  Hematological: Negative.   Psychiatric/Behavioral: Negative.   All other systems reviewed and are negative.      Objective:   Physical Exam  Vitals reviewed. Constitutional: He is oriented to person, place, and time. He appears well-developed and well-nourished. No distress.  HENT:  Head: Normocephalic.  Right Ear: External ear normal.  Left Ear: External ear normal.  Nasal passage erythemas  Oropharynx erythemas   Eyes: Pupils are equal, round, and reactive to light. Right eye exhibits no discharge. Left eye exhibits no discharge.  Neck: Normal range of motion. Neck supple. No thyromegaly present.  Cardiovascular: Normal rate, regular rhythm, normal heart sounds and intact distal pulses.   No murmur heard. Pulmonary/Chest: Effort normal and breath sounds normal. No respiratory distress. He has no wheezes.  Abdominal: Soft. Bowel sounds are normal. He exhibits no distension. There is no tenderness.  Musculoskeletal: Normal range of  motion. He exhibits no edema and no tenderness.  Neurological: He is alert and oriented to person, place, and time. He has normal reflexes. No cranial nerve deficit.  Skin: Skin is warm and dry. No rash noted. No erythema.  Psychiatric: He has a normal mood and affect. His behavior is normal. Judgment and thought content normal.      BP 130/75  Pulse 110  Temp(Src) 97.5 F (36.4 C) (Oral)  Ht 5\' 11"  (1.803 m)  Wt 160 lb 9.6 oz (72.848 kg)  BMI 22.41 kg/m2  SpO2 99%     Assessment & Plan:  1. URI (upper respiratory infection) -- Take meds as prescribed - Use a cool mist humidifier  -Use saline nose sprays frequently -Saline irrigations of the nose can be very helpful if done frequently.  * 4X daily for 1 week*  * Use of a nettie pot can be helpful with this. Follow directions with this* -Force fluids -For any cough or congestion  Use plain Mucinex- regular strength or max strength is fine   * Children- consult with Pharmacist for dosing -For fever or aces or pains- take tylenol or ibuprofen appropriate for age and weight.  * for fevers greater than 101 orally you may alternate ibuprofen and tylenol every  3 hours. -Throat lozenges if help - benzonatate (TESSALON) 200 MG capsule; Take 1 capsule (200 mg total) by mouth 3 (three) times daily as needed.  Dispense: 30 capsule; Refill: [place], FNP

## 2013-10-22 ENCOUNTER — Other Ambulatory Visit: Payer: Self-pay | Admitting: Family Medicine

## 2013-10-27 ENCOUNTER — Ambulatory Visit (HOSPITAL_BASED_OUTPATIENT_CLINIC_OR_DEPARTMENT_OTHER): Payer: Medicare Other

## 2013-10-27 ENCOUNTER — Encounter: Payer: Self-pay | Admitting: Physician Assistant

## 2013-10-27 ENCOUNTER — Telehealth: Payer: Self-pay | Admitting: Internal Medicine

## 2013-10-27 ENCOUNTER — Ambulatory Visit (HOSPITAL_BASED_OUTPATIENT_CLINIC_OR_DEPARTMENT_OTHER): Payer: Medicare Other | Admitting: Physician Assistant

## 2013-10-27 ENCOUNTER — Other Ambulatory Visit (HOSPITAL_BASED_OUTPATIENT_CLINIC_OR_DEPARTMENT_OTHER): Payer: Medicare Other

## 2013-10-27 ENCOUNTER — Other Ambulatory Visit: Payer: Self-pay | Admitting: Medical Oncology

## 2013-10-27 ENCOUNTER — Telehealth: Payer: Self-pay | Admitting: *Deleted

## 2013-10-27 VITALS — BP 133/79 | HR 76 | Temp 98.0°F | Resp 18 | Ht 71.0 in | Wt 166.5 lb

## 2013-10-27 DIAGNOSIS — Z5111 Encounter for antineoplastic chemotherapy: Secondary | ICD-10-CM

## 2013-10-27 DIAGNOSIS — C3431 Malignant neoplasm of lower lobe, right bronchus or lung: Secondary | ICD-10-CM

## 2013-10-27 DIAGNOSIS — R0602 Shortness of breath: Secondary | ICD-10-CM

## 2013-10-27 DIAGNOSIS — C3432 Malignant neoplasm of lower lobe, left bronchus or lung: Secondary | ICD-10-CM

## 2013-10-27 DIAGNOSIS — C3490 Malignant neoplasm of unspecified part of unspecified bronchus or lung: Secondary | ICD-10-CM

## 2013-10-27 DIAGNOSIS — C7931 Secondary malignant neoplasm of brain: Secondary | ICD-10-CM

## 2013-10-27 DIAGNOSIS — R21 Rash and other nonspecific skin eruption: Secondary | ICD-10-CM

## 2013-10-27 DIAGNOSIS — I878 Other specified disorders of veins: Secondary | ICD-10-CM

## 2013-10-27 LAB — CBC WITH DIFFERENTIAL/PLATELET
BASO%: 0.3 % (ref 0.0–2.0)
BASOS ABS: 0 10*3/uL (ref 0.0–0.1)
EOS%: 0 % (ref 0.0–7.0)
Eosinophils Absolute: 0 10*3/uL (ref 0.0–0.5)
HCT: 26.3 % — ABNORMAL LOW (ref 38.4–49.9)
HGB: 8.9 g/dL — ABNORMAL LOW (ref 13.0–17.1)
LYMPH%: 7.8 % — ABNORMAL LOW (ref 14.0–49.0)
MCH: 32.5 pg (ref 27.2–33.4)
MCHC: 33.7 g/dL (ref 32.0–36.0)
MCV: 96.3 fL (ref 79.3–98.0)
MONO#: 0.3 10*3/uL (ref 0.1–0.9)
MONO%: 6.8 % (ref 0.0–14.0)
NEUT#: 3.4 10*3/uL (ref 1.5–6.5)
NEUT%: 85.1 % — ABNORMAL HIGH (ref 39.0–75.0)
Platelets: 270 10*3/uL (ref 140–400)
RBC: 2.73 10*6/uL — ABNORMAL LOW (ref 4.20–5.82)
RDW: 16.3 % — AB (ref 11.0–14.6)
WBC: 4 10*3/uL (ref 4.0–10.3)
lymph#: 0.3 10*3/uL — ABNORMAL LOW (ref 0.9–3.3)

## 2013-10-27 LAB — COMPREHENSIVE METABOLIC PANEL (CC13)
ALBUMIN: 3.2 g/dL — AB (ref 3.5–5.0)
ALK PHOS: 155 U/L — AB (ref 40–150)
ALT: 20 U/L (ref 0–55)
ANION GAP: 9 meq/L (ref 3–11)
AST: 31 U/L (ref 5–34)
BUN: 12.5 mg/dL (ref 7.0–26.0)
CO2: 26 meq/L (ref 22–29)
Calcium: 9.1 mg/dL (ref 8.4–10.4)
Chloride: 106 mEq/L (ref 98–109)
Creatinine: 1 mg/dL (ref 0.7–1.3)
GLUCOSE: 123 mg/dL (ref 70–140)
POTASSIUM: 3.5 meq/L (ref 3.5–5.1)
Sodium: 141 mEq/L (ref 136–145)
Total Bilirubin: 0.58 mg/dL (ref 0.20–1.20)
Total Protein: 6.5 g/dL (ref 6.4–8.3)

## 2013-10-27 MED ORDER — DIPHENHYDRAMINE HCL 50 MG/ML IJ SOLN
INTRAMUSCULAR | Status: AC
  Filled 2013-10-27: qty 1

## 2013-10-27 MED ORDER — CYANOCOBALAMIN 1000 MCG/ML IJ SOLN
1000.0000 ug | Freq: Once | INTRAMUSCULAR | Status: AC
  Administered 2013-10-27: 1000 ug via INTRAMUSCULAR

## 2013-10-27 MED ORDER — CYANOCOBALAMIN 1000 MCG/ML IJ SOLN
INTRAMUSCULAR | Status: AC
  Filled 2013-10-27: qty 1

## 2013-10-27 MED ORDER — ONDANSETRON 8 MG/NS 50 ML IVPB
INTRAVENOUS | Status: AC
  Filled 2013-10-27: qty 8

## 2013-10-27 MED ORDER — PEMETREXED DISODIUM CHEMO INJECTION 500 MG
500.0000 mg/m2 | Freq: Once | INTRAVENOUS | Status: AC
  Administered 2013-10-27: 975 mg via INTRAVENOUS
  Filled 2013-10-27: qty 39

## 2013-10-27 MED ORDER — DIPHENHYDRAMINE HCL 50 MG/ML IJ SOLN
12.5000 mg | Freq: Once | INTRAMUSCULAR | Status: AC
  Administered 2013-10-27: 12.5 mg via INTRAVENOUS

## 2013-10-27 MED ORDER — DEXAMETHASONE SODIUM PHOSPHATE 10 MG/ML IJ SOLN
INTRAMUSCULAR | Status: AC
  Filled 2013-10-27: qty 1

## 2013-10-27 MED ORDER — FAMOTIDINE IN NACL 20-0.9 MG/50ML-% IV SOLN
INTRAVENOUS | Status: AC
  Filled 2013-10-27: qty 50

## 2013-10-27 MED ORDER — FAMOTIDINE IN NACL 20-0.9 MG/50ML-% IV SOLN
40.0000 mg | Freq: Once | INTRAVENOUS | Status: AC
  Administered 2013-10-27: 40 mg via INTRAVENOUS

## 2013-10-27 MED ORDER — DEXAMETHASONE SODIUM PHOSPHATE 10 MG/ML IJ SOLN
10.0000 mg | Freq: Once | INTRAMUSCULAR | Status: AC
  Administered 2013-10-27: 10 mg via INTRAVENOUS

## 2013-10-27 MED ORDER — ONDANSETRON 8 MG/50ML IVPB (CHCC)
8.0000 mg | Freq: Once | INTRAVENOUS | Status: AC
  Administered 2013-10-27: 8 mg via INTRAVENOUS

## 2013-10-27 MED ORDER — SODIUM CHLORIDE 0.9 % IV SOLN
Freq: Once | INTRAVENOUS | Status: AC
  Administered 2013-10-27: 13:00:00 via INTRAVENOUS

## 2013-10-27 NOTE — Telephone Encounter (Signed)
Per staff message and POF I have scheduled appts. Advised scheduler of appts. JMW  

## 2013-10-27 NOTE — Patient Instructions (Signed)
[facility] Discharge Instructions for Patients Receiving Chemotherapy  Today you received the following chemotherapy agents: Alimta  To help prevent nausea and vomiting after your treatment, we encourage you to take your nausea medication as prescribed by your physician.   If you develop nausea and vomiting that is not controlled by your nausea medication, call the clinic.   BELOW ARE SYMPTOMS THAT SHOULD BE REPORTED IMMEDIATELY:  *FEVER GREATER THAN 100.5 F  *CHILLS WITH OR WITHOUT FEVER  NAUSEA AND VOMITING THAT IS NOT CONTROLLED WITH YOUR NAUSEA MEDICATION  *UNUSUAL SHORTNESS OF BREATH  *UNUSUAL BRUISING OR BLEEDING  TENDERNESS IN MOUTH AND THROAT WITH OR WITHOUT PRESENCE OF ULCERS  *URINARY PROBLEMS  *BOWEL PROBLEMS  UNUSUAL RASH Items with * indicate a potential emergency and should be followed up as soon as possible.  Feel free to call the clinic you have any questions or concerns. The clinic phone number is [phone].

## 2013-10-27 NOTE — Progress Notes (Addendum)
[facility] [phone]   [phone]  SHARED VISIT PROGRESS NOTE  [name], MD [place] [address]  DIAGNOSIS: Stage IV ( T2b, N2, M1b) non-small cell lung cancer, adenocarcinoma with areas of squamous differentiation with negative EGFR mutation and negative ALK gene translocation, presented with left lower lobe lung mass in addition to mediastinal lymphadenopathy and brain metastases diagnosed in October of 2014  PRIOR THERAPY: 1) Stereotactic radiotherapy to 3 brain lesions under the care of Dr. Lisbeth Renshaw completed 12/08/2012. 2) Palliative radiotherapy to the left lower lobe lung mass under the care of Dr. Lisbeth Renshaw expected to be completed 01/01/2013. 3) Systemic chemotherapy with carboplatin for AUC of 5 and Alimta 500 mg/M2 every 3 weeks. First dose 12/23/2012. Status post 6 cycles  CURRENT THERAPY: Maintenance chemotherapy with single agent Alimta at 500 mg per meter squared given every 3 weeks. First cycle given 05/12/2013. Status post 8 cycles.   CHEMOTHERAPY INTENT: Palliative  CURRENT # OF CHEMOTHERAPY CYCLES: 9  CURRENT ANTIEMETICS:Zofran, dexamethasone and Compazine  CURRENT SMOKING STATUS: former smoker  ORAL CHEMOTHERAPY AND CONSENT: None  CURRENT BISPHOSPHONATES USE: None  PAIN MANAGEMENT: 0/10  NARCOTICS INDUCED CONSTIPATION: None  LIVING WILL AND CODE STATUS: ?  INTERVAL HISTORY: [name] 80 y.o. male returns to the clinic today for follow up visit accompanied by his wife. He has been on maintenance chemotherapy with single agent Alimta for the last 8 cycles. He tolerated the last cycle of his treatment fairly well except for mild fatigue. He reports actually increased shortness of breath and increased fatigue. He continues to have some lower extremity edema more so on the left than on the right. He reports that he developed rash a few days after chemotherapy that is very. They can last for 4-5 days.  The rash is described as "whelps" beginning at the groin and extending to the midtrunk region. It occurred with the last 2 cycles of single agent Alimta. He notes some questionable increase fullness about the left cheek over the past few weeks. He denies any neurologic symptomatology. The patient denied having any nausea or vomiting. He denied having any fever or chills. He has no chest pain, shortness of breath, cough or hemoptysis. He has no weight loss or night sweats. He presents to proceed with cycle #9.  MEDICAL HISTORY: Past Medical History  Diagnosis Date  . COPD (chronic obstructive pulmonary disease)   . Allergy     allergic rhinitis  . Hx of radiation therapy 12/09/12-01/01/13    lung 37.5Gy  . nscl ca w/ brain mets dx'd 08/2012    Patient has a lung mass which is being evaluated    ALLERGIES:  is allergic to sulfa antibiotics.  MEDICATIONS:  Current Outpatient Prescriptions  Medication Sig Dispense Refill  . acetaminophen (TYLENOL) 500 MG tablet Take 500 mg by mouth every 6 (six) hours as needed for mild pain.       . clobetasol cream (TEMOVATE) 0.03 % Apply 1 application topically daily as needed (for itching).      Marland Kitchen dexamethasone (DECADRON) 4 MG tablet 4 mg by mouth twice a day the day before, day of and day after the chemotherapy every 3 weeks  40 tablet  1  . ferrous sulfate (IRON SUPPLEMENT) 325 (65 FE) MG tablet Take 325 mg by mouth daily with breakfast.      . folic acid (FOLVITE) 491 MCG tablet Take 0.5 tablets (400 mcg total) by  mouth daily.  30 tablet  3  . Polyethyl Glycol-Propyl Glycol (SYSTANE OP) Place 1 drop into both eyes daily.      . simvastatin (ZOCOR) 10 MG tablet TAKE 1 TABLET (10 MG TOTAL) BY MOUTH AT BEDTIME.  90 tablet  0  . benzonatate (TESSALON) 200 MG capsule Take 1 capsule (200 mg total) by mouth 3 (three) times daily as needed.  30 capsule  1  . loratadine (ALLERGY RELIEF) 10 MG tablet Take 10 mg by mouth daily as needed for allergies.       Marland Kitchen  ondansetron (ZOFRAN) 4 MG tablet Take 1 tablet (4 mg total) by mouth every 6 (six) hours.  20 tablet  0  . prochlorperazine (COMPAZINE) 10 MG tablet Take 1 tablet (10 mg total) by mouth every 6 (six) hours as needed for nausea or vomiting.  60 tablet  0   No current facility-administered medications for this visit.    SURGICAL HISTORY:  Past Surgical History  Procedure Laterality Date  . Cholecystectomy      REVIEW OF SYSTEMS:  Constitutional: negative Eyes: negative Ears, nose, mouth, throat, and face: negative Respiratory: negative Cardiovascular: negative Gastrointestinal: negative Genitourinary:negative Integument/breast: negative Hematologic/lymphatic: negative Musculoskeletal:negative Neurological: negative Behavioral/Psych: negative Endocrine: negative Allergic/Immunologic: negative   PHYSICAL EXAMINATION: General appearance: alert, cooperative and no distress Head: Normocephalic, without obvious abnormality, atraumatic Neck: no adenopathy, no JVD, supple, symmetrical, trachea midline and thyroid not enlarged, symmetric, no tenderness/mass/nodules Lymph nodes: Cervical, supraclavicular, and axillary nodes normal. Resp: clear to auscultation bilaterally Back: symmetric, no curvature. ROM normal. No CVA tenderness. Cardio: regular rate and rhythm, S1, S2 normal, no murmur, click, rub or gallop GI: soft, non-tender; bowel sounds normal; no masses,  no organomegaly Extremities: extremities normal, atraumatic, no cyanosis or edema Neurologic: Alert and oriented X 3, normal strength and tone. Normal symmetric reflexes. Normal coordination and gait Examination this reveals questionable increased prominence of the left jaw line but no overt swelling or distortion  ECOG PERFORMANCE STATUS: 1 - Symptomatic but completely ambulatory  Blood pressure 133/79, pulse 76, temperature 98 F (36.7 C), temperature source Oral, resp. rate 18, height $RemoveBe'5\' 11"'[id number]$  (1.803 m), weight 166 lb 8 oz  (75.524 kg), SpO2 100.00%.  LABORATORY DATA: Lab Results  Component Value Date   WBC 4.0 10/27/2013   HGB 8.9* 10/27/2013   HCT 26.3* 10/27/2013   MCV 96.3 10/27/2013   PLT 270 10/27/2013      Chemistry      Component Value Date/Time   NA 141 10/27/2013 1131   NA 141 08/25/2013 1000   NA CANCELED 10/13/2012 1149   K 3.5 10/27/2013 1131   K 3.9 08/25/2013 1000   CL 102 08/25/2013 1000   CO2 26 10/27/2013 1131   CO2 24 08/25/2013 1000   BUN 12.5 10/27/2013 1131   BUN 11 08/25/2013 1000   BUN CANCELED 10/13/2012 1149   CREATININE 1.0 10/27/2013 1131   CREATININE 0.76 08/25/2013 1000      Component Value Date/Time   CALCIUM 9.1 10/27/2013 1131   CALCIUM 9.4 08/25/2013 1000   ALKPHOS 155* 10/27/2013 1131   ALKPHOS 185* 08/25/2013 1000   AST 31 10/27/2013 1131   AST 31 08/25/2013 1000   ALT 20 10/27/2013 1131   ALT 20 08/25/2013 1000   BILITOT 0.58 10/27/2013 1131   BILITOT 0.3 08/25/2013 1000       RADIOGRAPHIC STUDIES: Ct Chest W Contrast  09/11/2013   CLINICAL DATA:  Followup metastatic non-small cell lung carcinoma. Undergoing  chemotherapy. Previous radiation therapy.  EXAM: CT CHEST, ABDOMEN, AND PELVIS WITH CONTRAST  TECHNIQUE: Multidetector CT imaging of the chest, abdomen and pelvis was performed following the standard protocol during bolus administration of intravenous contrast.  CONTRAST:  OMNIPAQUE IOHEXOL 300 MG/ML  SOLN  COMPARISON:  07/10/2013  FINDINGS:   CT CHEST FINDINGS  Mediastinum/Hilar Regions: No masses or pathologically enlarged lymph nodes identified.  Other Thoracic Lymphadenopathy:  None.  Lungs: A cavitary mass in the central infrahilar region of the right lower lobe measures 3.0 x 4.8 cm on image 34, which is stable in size when measured in the same planes on previous exam. No new or enlarging pulmonary masses are identified. Radiation changes and chronic peripheral interstitial lung disease are stable in appearance.  Pleura: Small right pleural effusion  or pleural thickening shows no significant change.  Vascular/Cardiac: No thoracic aortic aneurysm or other significant abnormality identified.  Musculoskeletal:  No suspicious bone lesions identified.  Other:  None.    CT ABDOMEN AND PELVIS FINDINGS  Liver:  No mass or other parenchymal abnormality identified.  Gallbladder/Biliary: Prior cholecystectomy noted. No evidence of biliary dilatation.  Pancreas: No mass, inflammatory changes, or other parenchymal abnormality identified.  Spleen:  Within normal limits in size and appearance.  Adrenal Glands:  No mass identified.  Kidneys/Urinary Tract: No masses identified. No evidence of hydronephrosis.  Lymph Nodes:  No pathologically enlarged lymph nodes identified.  Bowel/Peritoneum: Mild diverticulosis is seen mainly in the sigmoid colon. No evidence of acute diverticulitis or other active inflammatory process.  Pelvic/Reproductive Organs: No mass or other significant abnormality identified.  Vascular:  No evidence of abdominal aortic aneurysm.  Musculoskeletal:  No suspicious bone lesions identified.  Other:  None.    IMPRESSION: No significant change in size of cavitary mass in the central right lower lobe.  No significant change in size of small right pleural effusion versus pleural thickening. Stable post radiation changes and chronic interstitial lung disease.  No new or progressive disease within the chest, abdomen, or pelvis.   Electronically Signed   By: Myles Rosenthal M.D.   On: 09/11/2013 11:04   ASSESSMENT AND PLAN: this is a very pleasant 80 years old white male with metastatic non-small cell lung cancer, adenocarcinoma with brain metastasis status post stereotactic radiotherapy to the brain lesions followed by palliative radiotherapy to the left lower lobe lung mass. The patient is currently undergoing systemic chemotherapy with carboplatin and Alimta status post 6 cycles with stable disease and he is currently undergoing maintenance chemotherapy with  single agent Alimta status post 8 cycles. He is tolerating the treatment well except for mild fatigue after his last treatment.  The recent CT scan of the chest, abdomen and pelvis showed no evidence for disease progression patient was discussed with and also seen by Dr. Arbutus Ped. He will proceed with cycle # 9 of maintenance Alimta today as scheduled. To address the delayed onset rash, we will add Benadryl 12.5 mg and Pepcid 40 mg to his premedications with today's cycle of single agent Alimta. He is also instructed to take his dexamethasone for a few extra days after chemotherapy. Patient voiced understanding. He will followup in 3 weeks with a restaging CT scan of the chest, abdomen and pelvis to reevaluate his disease. He is asked to keep an eye on the perceived left facial swelling.  He was advised to call immediately if he has any concerning symptoms in the interval. The patient voices understanding of current disease status and  treatment options and is in agreement with the current care plan.  All questions were answered. The patient knows to call the clinic with any problems, questions or concerns. We can certainly see the patient much sooner if necessary.    Disclaimer: This note was dictated with voice recognition software. Similar sounding words can inadvertently be transcribed and may not be corrected upon review. Carlton Adam, PA-C 10/27/2013  ADDENDUM: Hematology/Oncology Attending: I had a face to face encounter with the patient. I recommended his care plan. This is a very pleasant 80 years old white male with metastatic non-small cell lung cancer status post induction chemotherapy with carboplatin and Alimta and currently undergoing maintenance chemotherapy with single agent Alimta status post 8 cycles. He is tolerating his treatment fairly well with no significant adverse effects. We'll proceed with cycle #9 today as scheduled and the patient would come back for followup visit in  3 weeks for reevaluation after repeating CT scan of the chest, abdomen and pelvis for restaging of his disease. He was advised to call immediately if he has any concerning symptoms in the interval.  Disclaimer: This note was dictated with voice recognition software. Similar sounding words can inadvertently be transcribed and may be missed upon review. Preston Kempf., MD 11/01/2013

## 2013-10-27 NOTE — Telephone Encounter (Signed)
Gave AVS and Nov apt.  [facility] will call to set up apt. -[employer]

## 2013-10-30 NOTE — Patient Instructions (Signed)
Take your dexamethasone for a few extra days after chemotherapy as recommended Followup in 3 weeks with restaging CT scan of your chest, abdomen and pelvis to reevaluate her disease

## 2013-11-10 ENCOUNTER — Encounter (HOSPITAL_COMMUNITY): Payer: Self-pay | Admitting: Pharmacy Technician

## 2013-11-13 ENCOUNTER — Other Ambulatory Visit: Payer: Self-pay | Admitting: Radiology

## 2013-11-13 ENCOUNTER — Other Ambulatory Visit: Payer: Self-pay | Admitting: Medical Oncology

## 2013-11-13 ENCOUNTER — Telehealth: Payer: Self-pay | Admitting: Medical Oncology

## 2013-11-13 NOTE — Telephone Encounter (Signed)
Confirmed port procedure appt.

## 2013-11-16 ENCOUNTER — Other Ambulatory Visit: Payer: Self-pay | Admitting: Radiology

## 2013-11-17 ENCOUNTER — Encounter (HOSPITAL_COMMUNITY): Payer: Self-pay

## 2013-11-17 ENCOUNTER — Ambulatory Visit (HOSPITAL_COMMUNITY)
Admission: RE | Admit: 2013-11-17 | Discharge: 2013-11-17 | Disposition: A | Discharge status: Home / Self Care | Payer: Medicare Other | Source: Ambulatory Visit | Attending: Internal Medicine | Admitting: Internal Medicine

## 2013-11-17 ENCOUNTER — Other Ambulatory Visit: Payer: Self-pay | Admitting: Radiation Therapy

## 2013-11-17 ENCOUNTER — Other Ambulatory Visit: Payer: Self-pay | Admitting: Internal Medicine

## 2013-11-17 DIAGNOSIS — Z452 Encounter for adjustment and management of vascular access device: Secondary | ICD-10-CM | POA: Diagnosis present

## 2013-11-17 DIAGNOSIS — Z79899 Other long term (current) drug therapy: Secondary | ICD-10-CM | POA: Diagnosis not present

## 2013-11-17 DIAGNOSIS — C3490 Malignant neoplasm of unspecified part of unspecified bronchus or lung: Secondary | ICD-10-CM | POA: Insufficient documentation

## 2013-11-17 DIAGNOSIS — Z87891 Personal history of nicotine dependence: Secondary | ICD-10-CM | POA: Insufficient documentation

## 2013-11-17 DIAGNOSIS — C7931 Secondary malignant neoplasm of brain: Secondary | ICD-10-CM

## 2013-11-17 DIAGNOSIS — I878 Other specified disorders of veins: Secondary | ICD-10-CM

## 2013-11-17 DIAGNOSIS — Z7952 Long term (current) use of systemic steroids: Secondary | ICD-10-CM | POA: Diagnosis not present

## 2013-11-17 LAB — CBC WITH DIFFERENTIAL/PLATELET
BASOS ABS: 0 10*3/uL (ref 0.0–0.1)
Basophils Relative: 1 % (ref 0–1)
Eosinophils Absolute: 0.1 10*3/uL (ref 0.0–0.7)
Eosinophils Relative: 1 % (ref 0–5)
HCT: 28.8 % — ABNORMAL LOW (ref 39.0–52.0)
Hemoglobin: 9.4 g/dL — ABNORMAL LOW (ref 13.0–17.0)
LYMPHS PCT: 18 % (ref 12–46)
Lymphs Abs: 0.8 10*3/uL (ref 0.7–4.0)
MCH: 31.8 pg (ref 26.0–34.0)
MCHC: 32.6 g/dL (ref 30.0–36.0)
MCV: 97.3 fL (ref 78.0–100.0)
Monocytes Absolute: 0.7 10*3/uL (ref 0.1–1.0)
Monocytes Relative: 15 % — ABNORMAL HIGH (ref 3–12)
NEUTROS PCT: 65 % (ref 43–77)
Neutro Abs: 2.9 10*3/uL (ref 1.7–7.7)
PLATELETS: 319 10*3/uL (ref 150–400)
RBC: 2.96 MIL/uL — AB (ref 4.22–5.81)
RDW: 14.7 % (ref 11.5–15.5)
WBC: 4.5 10*3/uL (ref 4.0–10.5)

## 2013-11-17 LAB — PROTIME-INR
INR: 1.08 (ref 0.00–1.49)
PROTHROMBIN TIME: 14.1 s (ref 11.6–15.2)

## 2013-11-17 LAB — APTT: APTT: 39 s — AB (ref 24–37)

## 2013-11-17 MED ORDER — LIDOCAINE HCL 1 % IJ SOLN
INTRAMUSCULAR | Status: AC
  Filled 2013-11-17: qty 20

## 2013-11-17 MED ORDER — MIDAZOLAM HCL 2 MG/2ML IJ SOLN
INTRAMUSCULAR | Status: AC
  Filled 2013-11-17: qty 6

## 2013-11-17 MED ORDER — FENTANYL CITRATE 0.05 MG/ML IJ SOLN
INTRAMUSCULAR | Status: AC | PRN
  Administered 2013-11-17: 25 ug via INTRAVENOUS
  Administered 2013-11-17: 50 ug via INTRAVENOUS
  Administered 2013-11-17: 25 ug via INTRAVENOUS

## 2013-11-17 MED ORDER — CEFAZOLIN SODIUM-DEXTROSE 2-3 GM-% IV SOLR
INTRAVENOUS | Status: AC
  Administered 2013-11-17: 2 g via INTRAVENOUS
  Filled 2013-11-17: qty 50

## 2013-11-17 MED ORDER — FENTANYL CITRATE 0.05 MG/ML IJ SOLN
INTRAMUSCULAR | Status: AC
  Filled 2013-11-17: qty 6

## 2013-11-17 MED ORDER — SODIUM CHLORIDE 0.9 % IV SOLN
INTRAVENOUS | Status: DC
  Administered 2013-11-17: 12:00:00 via INTRAVENOUS

## 2013-11-17 MED ORDER — MIDAZOLAM HCL 2 MG/2ML IJ SOLN
INTRAMUSCULAR | Status: AC | PRN
  Administered 2013-11-17 (×4): 1 mg via INTRAVENOUS

## 2013-11-17 MED ORDER — HEPARIN SOD (PORK) LOCK FLUSH 100 UNIT/ML IV SOLN
INTRAVENOUS | Status: AC | PRN
  Administered 2013-11-17: 500 [IU]

## 2013-11-17 MED ORDER — HEPARIN SOD (PORK) LOCK FLUSH 100 UNIT/ML IV SOLN
INTRAVENOUS | Status: AC
  Filled 2013-11-17: qty 5

## 2013-11-17 MED ORDER — CEFAZOLIN SODIUM-DEXTROSE 2-3 GM-% IV SOLR
2.0000 g | INTRAVENOUS | Status: AC
  Administered 2013-11-17: 2 g via INTRAVENOUS

## 2013-11-17 NOTE — Discharge Instructions (Signed)
Leave dressing on for 24 hours.  You may shower after 24 hours.  Please remove the dressing before you shower.    Conscious Sedation, Adult, Care After Refer to this sheet in the next few weeks. These instructions provide you with information on caring for yourself after your procedure. Your health care provider may also give you more specific instructions. Your treatment has been planned according to current medical practices, but problems sometimes occur. Call your health care provider if you have any problems or questions after your procedure. WHAT TO EXPECT AFTER THE PROCEDURE  After your procedure:  You may feel sleepy, clumsy, and have poor balance for several hours.  Vomiting may occur if you eat too soon after the procedure. HOME CARE INSTRUCTIONS  Do not participate in any activities where you could become injured for at least 24 hours. Do not:  Drive.  Swim.  Ride a bicycle.  Operate heavy machinery.  Cook.  Use power tools.  Climb ladders.  Work from a high place.  Do not make important decisions or sign legal documents until you are improved.  If you vomit, drink water, juice, or soup when you can drink without vomiting. Make sure you have little or no nausea before eating solid foods.  Only take over-the-counter or prescription medicines for pain, discomfort, or fever as directed by your health care provider.  Make sure you and your family fully understand everything about the medicines given to you, including what side effects may occur.  You should not drink alcohol, take sleeping pills, or take medicines that cause drowsiness for at least 24 hours.  If you smoke, do not smoke without supervision.  If you are feeling better, you may resume normal activities 24 hours after you were sedated.  Keep all appointments with your health care provider. SEEK MEDICAL CARE IF:  Your skin is pale or bluish in color.  You continue to feel nauseous or vomit.  Your  pain is getting worse and is not helped by medicine.  You have bleeding or swelling.  You are still sleepy or feeling clumsy after 24 hours. SEEK IMMEDIATE MEDICAL CARE IF:  You develop a rash.  You have difficulty breathing.  You develop any type of allergic problem.  You have a fever. MAKE SURE YOU:  Understand these instructions.  Will watch your condition.  Will get help right away if you are not doing well or get worse. Document Released: 10/22/2012 Document Reviewed: 10/22/2012 [facility] Patient Information 2015 [place], [place]. This information is not intended to replace advice given to you by your health care provider. Make sure you discuss any questions you have with your health care provider.  Implanted Port Insertion, Care After Refer to this sheet in the next few weeks. These instructions provide you with information on caring for yourself after your procedure. Your health care provider may also give you more specific instructions. Your treatment has been planned according to current medical practices, but problems sometimes occur. Call your health care provider if you have any problems or questions after your procedure. WHAT TO EXPECT AFTER THE PROCEDURE After your procedure, it is typical to have the following:   Discomfort at the port insertion site. Ice packs to the area will help.  Bruising on the skin over the port. This will subside in 3-4 days. HOME CARE INSTRUCTIONS  After your port is placed, you will get a manufacturer's information card. The card has information about your port. Keep this card with you  at all times.   Know what kind of port you have. There are many types of ports available.   Wear a medical alert bracelet in case of an emergency. This can help alert health care workers that you have a port.   The port can stay in for as long as your health care provider believes it is necessary.   A home health care nurse may give medicines and  take care of the port.   You or a family member can get special training and directions for giving medicine and taking care of the port at home.  SEEK MEDICAL CARE IF:   Your port does not flush or you are unable to get a blood return.   You have a fever or chills. SEEK IMMEDIATE MEDICAL CARE IF:  You have new fluid or pus coming from your incision.   You notice a bad smell coming from your incision site.   You have swelling, pain, or more redness at the incision or port site.   You have chest pain or shortness of breath. Document Released: 10/22/2012 Document Revised: 01/06/2013 Document Reviewed: 10/22/2012 [facility] Patient Information 2015 [place], [place]. This information is not intended to replace advice given to you by your health care provider. Make sure you discuss any questions you have with your health care provider. Implanted [place] Guide An implanted port is a type of central line that is placed under the skin. Central lines are used to provide IV access when treatment or nutrition needs to be given through a person's veins. Implanted ports are used for long-term IV access. An implanted port may be placed because:   You need IV medicine that would be irritating to the small veins in your hands or arms.   You need long-term IV medicines, such as antibiotics.   You need IV nutrition for a long period.   You need frequent blood draws for lab tests.   You need dialysis.  Implanted ports are usually placed in the chest area, but they can also be placed in the upper arm, the abdomen, or the leg. An implanted port has two main parts:   Reservoir. The reservoir is round and will appear as a small, raised area under your skin. The reservoir is the part where a needle is inserted to give medicines or draw blood.   Catheter. The catheter is a thin, flexible tube that extends from the reservoir. The catheter is placed into a large vein. Medicine that is inserted into  the reservoir goes into the catheter and then into the vein.  HOW WILL I CARE FOR MY INCISION SITE? Do not get the incision site wet. Bathe or shower as directed by your health care provider.  HOW IS MY PORT ACCESSED? Special steps must be taken to access the port:   Before the port is accessed, a numbing cream can be placed on the skin. This helps numb the skin over the port site.   Your health care provider uses a sterile technique to access the port.  Your health care provider must put on a mask and sterile gloves.  The skin over your port is cleaned carefully with an antiseptic and allowed to dry.  The port is gently pinched between sterile gloves, and a needle is inserted into the port.  Only "non-coring" port needles should be used to access the port. Once the port is accessed, a blood return should be checked. This helps ensure that the port is in  the vein and is not clogged.   If your port needs to remain accessed for a constant infusion, a clear (transparent) bandage will be placed over the needle site. The bandage and needle will need to be changed every week, or as directed by your health care provider.   Keep the bandage covering the needle clean and dry. Do not get it wet. Follow your health care provider's instructions on how to take a shower or bath while the port is accessed.   If your port does not need to stay accessed, no bandage is needed over the port.  WHAT IS FLUSHING? Flushing helps keep the port from getting clogged. Follow your health care provider's instructions on how and when to flush the port. Ports are usually flushed with saline solution or a medicine called heparin. The need for flushing will depend on how the port is used.   If the port is used for intermittent medicines or blood draws, the port will need to be flushed:   After medicines have been given.   After blood has been drawn.   As part of routine maintenance.   If a constant  infusion is running, the port may not need to be flushed.  HOW LONG WILL MY PORT STAY IMPLANTED? The port can stay in for as long as your health care provider thinks it is needed. When it is time for the port to come out, surgery will be done to remove it. The procedure is similar to the one performed when the port was put in.  WHEN SHOULD I SEEK IMMEDIATE MEDICAL CARE? When you have an implanted port, you should seek immediate medical care if:   You notice a bad smell coming from the incision site.   You have swelling, redness, or drainage at the incision site.   You have more swelling or pain at the port site or the surrounding area.   You have a fever that is not controlled with medicine. Document Released: 01/01/2005 Document Revised: 10/22/2012 Document Reviewed: 09/08/2012 [facility] Patient Information 2015 [place], [place]. This information is not intended to replace advice given to you by your health care provider. Make sure you discuss any questions you have with your health care provider.

## 2013-11-17 NOTE — Procedures (Signed)
Successful RT IJ POWER PORT TIP SVC/RA NO COMP STABLE FULL REPORT IN PACS  

## 2013-11-17 NOTE — H&P (Signed)
Chief Complaint: "I'm getting a port a cath"  Referring Physician(s): Mohamed,Mohamed  History of Present Illness: [name] is a 80 y.o. male with history of stage IV [place] lung cancer who presents today for port a cath placement for chemotherapy.                                                                                                                                                                              Past Medical History  Diagnosis Date  . COPD (chronic obstructive pulmonary disease)   . Allergy     allergic rhinitis  . Hx of radiation therapy 12/09/12-01/01/13    lung 37.5Gy  . nscl ca w/ brain mets dx'd 08/2012    Patient has a lung mass which is being evaluated    Past Surgical History  Procedure Laterality Date  . Cholecystectomy      Allergies: Sulfa antibiotics  Medications: Prior to Admission medications   Medication Sig Start Date End Date Taking? Authorizing Provider  acetaminophen (TYLENOL) 500 MG tablet Take 500 mg by mouth every 6 (six) hours as needed for mild pain.    Yes Historical Provider, MD  clobetasol cream (TEMOVATE) 4.81 % Apply 1 application topically daily as needed (for itching).   Yes Historical Provider, MD  ferrous sulfate (IRON SUPPLEMENT) 325 (65 FE) MG tablet Take 325 mg by mouth daily with breakfast.   Yes Historical Provider, MD  folic acid (FOLVITE) 856 MCG tablet Take 400 mcg by mouth every morning.   Yes Historical Provider, MD  loratadine (ALLERGY RELIEF) 10 MG tablet Take 10 mg by mouth daily as needed for allergies.    Yes Historical Provider, MD  Polyethyl Glycol-Propyl Glycol (SYSTANE OP) Place 1 drop into both eyes daily.   Yes Historical Provider, MD  simvastatin (ZOCOR) 10 MG tablet Take 10 mg by mouth at bedtime.   Yes Historical Provider, MD  benzonatate (TESSALON) 200 MG capsule Take 200 mg by mouth 3 (three) times daily as needed for cough.    Historical Provider, MD  dexamethasone (DECADRON) 4 MG tablet 4  mg by mouth twice a day the day before, day of and day after the chemotherapy every 3 weeks 09/15/13   Curt Bears, MD  ondansetron (ZOFRAN) 4 MG tablet Take 4 mg by mouth every 6 (six) hours as needed for nausea or vomiting.    Historical Provider, MD  prochlorperazine (COMPAZINE) 10 MG tablet Take 1 tablet (10 mg total) by mouth every 6 (six) hours as needed for nausea or vomiting. 12/19/12   Curt Bears, MD    Family History  Problem Relation Age of Onset  . Cancer Mother  breast  . Stroke Father     History   Social History  . Marital Status: Married    Spouse Name: N/A    Number of Children: N/A  . Years of Education: N/A   Social History Main Topics  . Smoking status: Former Smoker    Quit date: 09/11/1998  . Smokeless tobacco: Never Used  . Alcohol Use: No  . Drug Use: No  . Sexual Activity: None   Other Topics Concern  . None   Social History Narrative         Review of Systems  Constitutional: Negative for fever and chills.  Respiratory:       Occ cough/dyspnea with exertion  Cardiovascular: Positive for leg swelling. Negative for chest pain.  Gastrointestinal: Negative for nausea, vomiting, abdominal pain and blood in stool.  Genitourinary: Negative for dysuria and hematuria.  Musculoskeletal: Negative for back pain.  Neurological: Negative for headaches.    Vital Signs: BP 141/85 mmHg  Pulse 99  Temp(Src) 97.8 F (36.6 C) (Oral)  Resp 18  SpO2 100%  Physical Exam  Constitutional: He is oriented to person, place, and time. He appears well-developed and well-nourished.  Cardiovascular: Normal rate and regular rhythm.   Pulmonary/Chest: Effort normal and breath sounds normal.  Abdominal: Soft. Bowel sounds are normal. There is no tenderness.  Musculoskeletal: Normal range of motion.  Neurological: He is alert and oriented to person, place, and time.    Imaging: No results found.  Labs:  CBC:  Recent Labs  09/15/13 1052  10/06/13 1403 10/27/13 1131 11/17/13 1145  WBC 5.6 5.9 4.0 4.5  HGB 9.5* 9.3* 8.9* 9.4*  HCT 28.3* 27.7* 26.3* 28.8*  PLT 244 260 270 319    COAGS:  Recent Labs  11/24/12 0752 11/17/13 1145  INR 0.98 1.08  APTT 33 39*    BMP:  Recent Labs  06/28/13 1150  08/25/13 1000 09/15/13 1054 10/06/13 1403 10/27/13 1131  NA 135*  < > 141 139 142 141  K 3.6*  < > 3.9 3.8 3.2* 3.5  CL 94*  --  102  --   --   --   CO2 29  < > 24 23 26 26   GLUCOSE 108*  < > 143* 111 159* 123  BUN 15  < > 11 13.2 10.7 12.5  CALCIUM 8.9  < > 9.4 9.2 9.0 9.1  CREATININE 0.71  < > 0.76 0.9 0.9 1.0  GFRNONAA 88*  --   --   --   --   --   GFRAA >90  --   --   --   --   --   < > = values in this interval not displayed.  LIVER FUNCTION TESTS:  Recent Labs  08/25/13 1000 09/15/13 1054 10/06/13 1403 10/27/13 1131  BILITOT 0.3 0.44 0.57 0.58  AST 31 24 29 31   ALT 20 17 18 20   ALKPHOS 185* 153* 200* 155*  PROT 7.0 6.6 6.7 6.5  ALBUMIN 3.4* 3.3* 3.3* 3.2*    TUMOR MARKERS: No results for input(s): AFPTM, CEA, CA199, CHROMGRNA in the last 8760 hours.  Assessment and Plan: [name] is a 80 y.o. male with history of stage IV [place] lung cancer who presents today for port a cath placement for chemotherapy.  Details/risks of procedure d/w pt/family with their understanding and consent.            Signed: Autumn Messing 11/17/2013, 12:50 PM

## 2013-11-20 ENCOUNTER — Encounter (HOSPITAL_COMMUNITY): Payer: Self-pay

## 2013-11-20 ENCOUNTER — Ambulatory Visit (HOSPITAL_COMMUNITY)
Admission: RE | Admit: 2013-11-20 | Discharge: 2013-11-20 | Disposition: A | Discharge status: Home / Self Care | Payer: Medicare Other | Source: Ambulatory Visit | Attending: Diagnostic Radiology | Admitting: Diagnostic Radiology

## 2013-11-20 DIAGNOSIS — N400 Enlarged prostate without lower urinary tract symptoms: Secondary | ICD-10-CM | POA: Insufficient documentation

## 2013-11-20 DIAGNOSIS — I2510 Atherosclerotic heart disease of native coronary artery without angina pectoris: Secondary | ICD-10-CM | POA: Diagnosis not present

## 2013-11-20 DIAGNOSIS — J90 Pleural effusion, not elsewhere classified: Secondary | ICD-10-CM | POA: Diagnosis not present

## 2013-11-20 DIAGNOSIS — N200 Calculus of kidney: Secondary | ICD-10-CM | POA: Insufficient documentation

## 2013-11-20 DIAGNOSIS — Z79899 Other long term (current) drug therapy: Secondary | ICD-10-CM | POA: Diagnosis not present

## 2013-11-20 DIAGNOSIS — C7931 Secondary malignant neoplasm of brain: Secondary | ICD-10-CM | POA: Diagnosis not present

## 2013-11-20 DIAGNOSIS — K859 Acute pancreatitis, unspecified: Secondary | ICD-10-CM | POA: Diagnosis not present

## 2013-11-20 DIAGNOSIS — C3431 Malignant neoplasm of lower lobe, right bronchus or lung: Secondary | ICD-10-CM | POA: Insufficient documentation

## 2013-11-20 DIAGNOSIS — J8410 Pulmonary fibrosis, unspecified: Secondary | ICD-10-CM | POA: Diagnosis not present

## 2013-11-20 MED ORDER — IOHEXOL 300 MG/ML  SOLN
100.0000 mL | Freq: Once | INTRAMUSCULAR | Status: AC | PRN
  Administered 2013-11-20: 100 mL via INTRAVENOUS

## 2013-11-24 ENCOUNTER — Ambulatory Visit (HOSPITAL_BASED_OUTPATIENT_CLINIC_OR_DEPARTMENT_OTHER): Payer: Medicare Other | Admitting: Internal Medicine

## 2013-11-24 ENCOUNTER — Telehealth: Payer: Self-pay | Admitting: *Deleted

## 2013-11-24 ENCOUNTER — Ambulatory Visit (HOSPITAL_COMMUNITY)
Admission: RE | Admit: 2013-11-24 | Discharge: 2013-11-24 | Disposition: A | Discharge status: Home / Self Care | Payer: Medicare Other | Source: Ambulatory Visit | Attending: Internal Medicine | Admitting: Internal Medicine

## 2013-11-24 ENCOUNTER — Encounter: Payer: Self-pay | Admitting: Internal Medicine

## 2013-11-24 ENCOUNTER — Other Ambulatory Visit (HOSPITAL_BASED_OUTPATIENT_CLINIC_OR_DEPARTMENT_OTHER): Payer: Medicare Other

## 2013-11-24 ENCOUNTER — Telehealth: Payer: Self-pay | Admitting: Physician Assistant

## 2013-11-24 ENCOUNTER — Ambulatory Visit: Payer: Medicare Other

## 2013-11-24 ENCOUNTER — Ambulatory Visit (HOSPITAL_BASED_OUTPATIENT_CLINIC_OR_DEPARTMENT_OTHER): Payer: Medicare Other

## 2013-11-24 VITALS — BP 149/83 | HR 92 | Temp 97.8°F | Resp 18 | Ht 71.0 in | Wt 163.7 lb

## 2013-11-24 DIAGNOSIS — Z5111 Encounter for antineoplastic chemotherapy: Secondary | ICD-10-CM

## 2013-11-24 DIAGNOSIS — M7989 Other specified soft tissue disorders: Secondary | ICD-10-CM

## 2013-11-24 DIAGNOSIS — C3431 Malignant neoplasm of lower lobe, right bronchus or lung: Secondary | ICD-10-CM

## 2013-11-24 DIAGNOSIS — R2243 Localized swelling, mass and lump, lower limb, bilateral: Secondary | ICD-10-CM

## 2013-11-24 DIAGNOSIS — C3490 Malignant neoplasm of unspecified part of unspecified bronchus or lung: Secondary | ICD-10-CM

## 2013-11-24 DIAGNOSIS — Z95828 Presence of other vascular implants and grafts: Secondary | ICD-10-CM

## 2013-11-24 DIAGNOSIS — C7931 Secondary malignant neoplasm of brain: Secondary | ICD-10-CM

## 2013-11-24 LAB — CBC WITH DIFFERENTIAL/PLATELET
BASO%: 0.2 % (ref 0.0–2.0)
BASOS ABS: 0 10*3/uL (ref 0.0–0.1)
EOS%: 0 % (ref 0.0–7.0)
Eosinophils Absolute: 0 10*3/uL (ref 0.0–0.5)
HCT: 27.8 % — ABNORMAL LOW (ref 38.4–49.9)
HEMOGLOBIN: 9.5 g/dL — AB (ref 13.0–17.1)
LYMPH#: 0.4 10*3/uL — AB (ref 0.9–3.3)
LYMPH%: 5.6 % — ABNORMAL LOW (ref 14.0–49.0)
MCH: 32.5 pg (ref 27.2–33.4)
MCHC: 34.2 g/dL (ref 32.0–36.0)
MCV: 95.2 fL (ref 79.3–98.0)
MONO#: 0.3 10*3/uL (ref 0.1–0.9)
MONO%: 5 % (ref 0.0–14.0)
NEUT%: 89.2 % — ABNORMAL HIGH (ref 39.0–75.0)
NEUTROS ABS: 5.6 10*3/uL (ref 1.5–6.5)
Platelets: 203 10*3/uL (ref 140–400)
RBC: 2.92 10*6/uL — AB (ref 4.20–5.82)
RDW: 14.3 % (ref 11.0–14.6)
WBC: 6.2 10*3/uL (ref 4.0–10.3)

## 2013-11-24 LAB — COMPREHENSIVE METABOLIC PANEL (CC13)
ALBUMIN: 3.3 g/dL — AB (ref 3.5–5.0)
ALK PHOS: 185 U/L — AB (ref 40–150)
ALT: 13 U/L (ref 0–55)
AST: 30 U/L (ref 5–34)
Anion Gap: 9 mEq/L (ref 3–11)
BUN: 15 mg/dL (ref 7.0–26.0)
CALCIUM: 9.4 mg/dL (ref 8.4–10.4)
CHLORIDE: 104 meq/L (ref 98–109)
CO2: 27 mEq/L (ref 22–29)
Creatinine: 1 mg/dL (ref 0.7–1.3)
Glucose: 124 mg/dl (ref 70–140)
POTASSIUM: 3.9 meq/L (ref 3.5–5.1)
SODIUM: 140 meq/L (ref 136–145)
TOTAL PROTEIN: 6.7 g/dL (ref 6.4–8.3)
Total Bilirubin: 0.6 mg/dL (ref 0.20–1.20)

## 2013-11-24 MED ORDER — ONDANSETRON 8 MG/50ML IVPB (CHCC)
8.0000 mg | Freq: Once | INTRAVENOUS | Status: AC
  Administered 2013-11-24: 8 mg via INTRAVENOUS

## 2013-11-24 MED ORDER — HEPARIN SOD (PORK) LOCK FLUSH 100 UNIT/ML IV SOLN
500.0000 [IU] | Freq: Once | INTRAVENOUS | Status: AC | PRN
  Administered 2013-11-24: 500 [IU]
  Filled 2013-11-24: qty 5

## 2013-11-24 MED ORDER — DIPHENHYDRAMINE HCL 50 MG/ML IJ SOLN
INTRAMUSCULAR | Status: AC
  Filled 2013-11-24: qty 1

## 2013-11-24 MED ORDER — SODIUM CHLORIDE 0.9 % IV SOLN
Freq: Once | INTRAVENOUS | Status: AC
  Administered 2013-11-24: 13:00:00 via INTRAVENOUS

## 2013-11-24 MED ORDER — DEXAMETHASONE SODIUM PHOSPHATE 10 MG/ML IJ SOLN
10.0000 mg | Freq: Once | INTRAMUSCULAR | Status: AC
  Administered 2013-11-24: 10 mg via INTRAVENOUS

## 2013-11-24 MED ORDER — FAMOTIDINE IN NACL 20-0.9 MG/50ML-% IV SOLN
20.0000 mg | Freq: Once | INTRAVENOUS | Status: AC
  Administered 2013-11-24: 20 mg via INTRAVENOUS

## 2013-11-24 MED ORDER — DIPHENHYDRAMINE HCL 50 MG/ML IJ SOLN
12.5000 mg | Freq: Once | INTRAMUSCULAR | Status: AC
  Administered 2013-11-24: 12.5 mg via INTRAVENOUS

## 2013-11-24 MED ORDER — SODIUM CHLORIDE 0.9 % IJ SOLN
10.0000 mL | INTRAMUSCULAR | Status: DC | PRN
  Administered 2013-11-24: 10 mL
  Filled 2013-11-24: qty 10

## 2013-11-24 MED ORDER — PEMETREXED DISODIUM CHEMO INJECTION 500 MG
1000.0000 mg | Freq: Once | INTRAVENOUS | Status: AC
  Administered 2013-11-24: 1000 mg via INTRAVENOUS
  Filled 2013-11-24: qty 40

## 2013-11-24 MED ORDER — FAMOTIDINE IN NACL 20-0.9 MG/50ML-% IV SOLN
INTRAVENOUS | Status: AC
  Filled 2013-11-24: qty 50

## 2013-11-24 MED ORDER — FUROSEMIDE 20 MG PO TABS: ORAL_TABLET | ORAL | Status: DC

## 2013-11-24 MED ORDER — DEXAMETHASONE SODIUM PHOSPHATE 10 MG/ML IJ SOLN
INTRAMUSCULAR | Status: AC
Start: 2013-11-24 — End: 2013-11-24
  Filled 2013-11-24: qty 1

## 2013-11-24 MED ORDER — ONDANSETRON 8 MG/NS 50 ML IVPB
INTRAVENOUS | Status: AC
Start: 2013-11-24 — End: 2013-11-24
  Filled 2013-11-24: qty 8

## 2013-11-24 MED ORDER — SODIUM CHLORIDE 0.9 % IJ SOLN
10.0000 mL | INTRAMUSCULAR | Status: DC | PRN
  Administered 2013-11-24: 10 mL via INTRAVENOUS
  Filled 2013-11-24: qty 10

## 2013-11-24 NOTE — Telephone Encounter (Signed)
Per staff message and POF I have scheduled appts. Advised scheduler of appts. JMW  

## 2013-11-24 NOTE — Progress Notes (Signed)
*  PRELIMINARY RESULTS* Vascular Ultrasound Right lower extremity venous duplex has been completed.  Preliminary findings: no evidence of DVT. Superficial thrombosis is noted in the right lesser saphenous vein.  Attempted call report to Dr. Julien Nordmann. Left voice message with results on nurse line.   Landry Mellow, RDMS, RVT  11/24/2013, 12:35 PM

## 2013-11-24 NOTE — Patient Instructions (Signed)

## 2013-11-24 NOTE — Progress Notes (Signed)
[facility] [phone]   [phone]  OFFICE PROGRESS NOTE  [name], [place] [address]  DIAGNOSIS: Stage IV ( T2b, N2, M1b) non-small cell lung cancer, adenocarcinoma with areas of squamous differentiation with negative EGFR mutation and negative ALK gene translocation, presented with left lower lobe lung mass in addition to mediastinal lymphadenopathy and brain metastases diagnosed in October of 2014  PRIOR THERAPY: 1) Stereotactic radiotherapy to 3 brain lesions under the care of Dr. Lisbeth Renshaw completed 12/08/2012. 2) Palliative radiotherapy to the left lower lobe lung mass under the care of Dr. Lisbeth Renshaw expected to be completed 01/01/2013. 3) Systemic chemotherapy with carboplatin for AUC of 5 and Alimta 500 mg/M2 every 3 weeks. First dose 12/23/2012. Status post 6 cycles  CURRENT THERAPY: Maintenance chemotherapy with single agent Alimta at 500 mg per meter squared given every 3 weeks. First cycle expected to be given 05/12/2013. Status post 9 cycles.   CHEMOTHERAPY INTENT: Palliative  CURRENT # OF CHEMOTHERAPY CYCLES: 10  CURRENT ANTIEMETICS:Zofran, dexamethasone and Compazine  CURRENT SMOKING STATUS: former smoker  ORAL CHEMOTHERAPY AND CONSENT: None  CURRENT BISPHOSPHONATES USE: None  PAIN MANAGEMENT: 0/10  NARCOTICS INDUCED CONSTIPATION: None  LIVING WILL AND CODE STATUS: ?  INTERVAL HISTORY: [name] 80 y.o. male returns to the clinic today for follow up visit. He has been on maintenance chemotherapy with single agent Alimta for the last 9 cycles. He tolerated the last cycle of his treatment fairly well except for mild fatigue. The patient denied having any nausea or vomiting. He denied having any fever or chills. He has no chest pain, shortness of breath, cough or hemoptysis. He has no weight loss or night sweats. He complained of swelling of the lower extremities more on the right side. He had repeat  CT scan of the chest, abdomen and pelvis performed recently and he is here for evaluation and discussion of his scan results.   MEDICAL HISTORY: Past Medical History  Diagnosis Date  . COPD (chronic obstructive pulmonary disease)   . Allergy     allergic rhinitis  . Hx of radiation therapy 12/09/12-01/01/13    lung 37.5Gy  . nscl ca w/ brain mets dx'd 08/2012    Patient has a lung mass which is being evaluated    ALLERGIES:  is allergic to sulfa antibiotics.  MEDICATIONS:  Current Outpatient Prescriptions  Medication Sig Dispense Refill  . acetaminophen (TYLENOL) 500 MG tablet Take 500 mg by mouth every 6 (six) hours as needed for mild pain.     . clobetasol cream (TEMOVATE) 5.00 % Apply 1 application topically daily as needed (for itching).    Marland Kitchen dexamethasone (DECADRON) 4 MG tablet 4 mg by mouth twice a day the day before, day of and day after the chemotherapy every 3 weeks 40 tablet 1  . ferrous sulfate (IRON SUPPLEMENT) 325 (65 FE) MG tablet Take 325 mg by mouth daily with breakfast.    . folic acid (FOLVITE) 938 MCG tablet Take 400 mcg by mouth every morning.    . loratadine (ALLERGY RELIEF) 10 MG tablet Take 10 mg by mouth daily as needed for allergies.     Vladimir Faster Glycol-Propyl Glycol (SYSTANE OP) Place 1 drop into both eyes daily.    . simvastatin (ZOCOR) 10 MG tablet Take 10 mg by mouth at bedtime.    . benzonatate (TESSALON) 200 MG capsule Take 200 mg by mouth 3 (three) times daily as  needed for cough.    . ondansetron (ZOFRAN) 4 MG tablet Take 4 mg by mouth every 6 (six) hours as needed for nausea or vomiting.    . prochlorperazine (COMPAZINE) 10 MG tablet Take 1 tablet (10 mg total) by mouth every 6 (six) hours as needed for nausea or vomiting. 60 tablet 0   No current facility-administered medications for this visit.    SURGICAL HISTORY:  Past Surgical History  Procedure Laterality Date  . Cholecystectomy      REVIEW OF SYSTEMS:  Constitutional: negative Eyes:  negative Ears, nose, mouth, throat, and face: negative Respiratory: negative Cardiovascular: negative Gastrointestinal: negative Genitourinary:negative Integument/breast: negative Hematologic/lymphatic: negative Musculoskeletal:negative Neurological: negative Behavioral/Psych: negative Endocrine: negative Allergic/Immunologic: negative   PHYSICAL EXAMINATION: General appearance: alert, cooperative and no distress Head: Normocephalic, without obvious abnormality, atraumatic Neck: no adenopathy, no JVD, supple, symmetrical, trachea midline and thyroid not enlarged, symmetric, no tenderness/mass/nodules Lymph nodes: Cervical, supraclavicular, and axillary nodes normal. Resp: clear to auscultation bilaterally Back: symmetric, no curvature. ROM normal. No CVA tenderness. Cardio: regular rate and rhythm, S1, S2 normal, no murmur, click, rub or gallop GI: soft, non-tender; bowel sounds normal; no masses,  no organomegaly Extremities: edema 2+ Neurologic: Alert and oriented X 3, normal strength and tone. Normal symmetric reflexes. Normal coordination and gait  ECOG PERFORMANCE STATUS: 1 - Symptomatic but completely ambulatory  Blood pressure 149/83, pulse 92, temperature 97.8 F (36.6 C), temperature source Oral, resp. rate 18, height _0  (1.803 m), weight 163 lb 11.2 oz (74.254 kg), SpO2 99 %.  LABORATORY DATA: Lab Results  Component Value Date   WBC 6.2 11/24/2013   HGB 9.5* 11/24/2013   HCT 27.8* 11/24/2013   MCV 95.2 11/24/2013   PLT 203 11/24/2013      Chemistry      Component Value Date/Time   NA 140 11/24/2013 0954   NA 141 08/25/2013 1000   NA CANCELED 10/13/2012 1149   K 3.9 11/24/2013 0954   K 3.9 08/25/2013 1000   CL 102 08/25/2013 1000   CO2 27 11/24/2013 0954   CO2 24 08/25/2013 1000   BUN 15.0 11/24/2013 0954   BUN 11 08/25/2013 1000   BUN CANCELED 10/13/2012 1149   CREATININE 1.0 11/24/2013 0954   CREATININE 0.76 08/25/2013 1000      Component Value  Date/Time   CALCIUM 9.4 11/24/2013 0954   CALCIUM 9.4 08/25/2013 1000   ALKPHOS 185* 11/24/2013 0954   ALKPHOS 185* 08/25/2013 1000   AST 30 11/24/2013 0954   AST 31 08/25/2013 1000   ALT 13 11/24/2013 0954   ALT 20 08/25/2013 1000   BILITOT 0.60 11/24/2013 0954   BILITOT 0.3 08/25/2013 1000       RADIOGRAPHIC STUDIES: Ct Chest W Contrast  11/20/2013   CLINICAL DATA:  Right lower lobe lung cancer, metastatic to brain. Ongoing chemotherapy.  EXAM: CT CHEST, ABDOMEN, AND PELVIS WITH CONTRAST  TECHNIQUE: Multidetector CT imaging of the chest, abdomen and pelvis was performed following the standard protocol during bolus administration of intravenous contrast.  CONTRAST:  158m OMNIPAQUE IOHEXOL 300 MG/ML  SOLN  COMPARISON:  09/11/2013.  FINDINGS: CT CHEST FINDINGS  Right IJ Port-A-Cath terminates in the high right atrium. No pathologically enlarged mediastinal, left hilar or axillary lymph nodes. Atherosclerotic calcification of the arterial vasculature, including coronary arteries. Heart size normal. Small amount of apical pericardial fluid is unchanged.  Small right pleural effusion, slightly increased in size. Partially cavitary mass in the perihilar right lower lobe measures approximately  2.8 x 4.7 cm, stable. Surrounding ground-glass, volume loss and architectural distortion appear slightly progressive. Bronchiectasis and mild volume loss in the medial aspect of the right upper lobe are noted as well. Small subpleural nodule in the right lower lobe measures 10 mm (image 45), stable. Subpleural reticular changes in the left lung are unchanged. No left pleural fluid. Airway is otherwise unremarkable.  CT ABDOMEN AND PELVIS FINDINGS  Hepatobiliary: Liver is unremarkable. Cholecystectomy. No biliary ductal dilatation.  Pancreas: Tiny calcifications are seen in the uncinate process. Pancreas is otherwise negative.  Spleen: Negative.  Adrenals/Urinary Tract: Adrenal glands are unremarkable. Tiny stone in  the lower pole right kidney. Ureters are minimally prominent, similar to the prior exam. Bladder is unremarkable.  Stomach/Bowel: Stomach is unremarkable. Duodenal diverticulum is incidentally noted. Small bowel, appendix and colon are otherwise unremarkable.  Vascular/Lymphatic: Atherosclerotic calcification of the arterial vasculature without abdominal aortic aneurysm. No pathologically enlarged lymph nodes.  Reproductive: Prostate is minimally enlarged.  Other: No free fluid.  Mesenteries and peritoneum are unremarkable.  Musculoskeletal: No worrisome lytic or sclerotic lesions. Degenerative changes are seen in the spine.  IMPRESSION: 1. Cavitary right lower lobe mass is stable in size. Surrounding architectural distortion and volume loss have progressed slightly in the interval and are likely treatment related. No evidence of distant metastatic disease. 2. Small right pleural effusion, slightly increased. 3. Coronary artery calcification. 4. Mild subpleural fibrotic changes in the left lung. 5. Mild changes of chronic calcific pancreatitis. 6. Tiny right renal stone. 7. Mild prostate enlargement.   Electronically Signed   By: Lorin Picket M.D.   On: 11/20/2013 11:05   Ct Abdomen Pelvis W Contrast  11/20/2013   CLINICAL DATA:  Right lower lobe lung cancer, metastatic to brain. Ongoing chemotherapy.  EXAM: CT CHEST, ABDOMEN, AND PELVIS WITH CONTRAST  TECHNIQUE: Multidetector CT imaging of the chest, abdomen and pelvis was performed following the standard protocol during bolus administration of intravenous contrast.  CONTRAST:  178m OMNIPAQUE IOHEXOL 300 MG/ML  SOLN  COMPARISON:  09/11/2013.  FINDINGS: CT CHEST FINDINGS  Right IJ Port-A-Cath terminates in the high right atrium. No pathologically enlarged mediastinal, left hilar or axillary lymph nodes. Atherosclerotic calcification of the arterial vasculature, including coronary arteries. Heart size normal. Small amount of apical pericardial fluid is  unchanged.  Small right pleural effusion, slightly increased in size. Partially cavitary mass in the perihilar right lower lobe measures approximately 2.8 x 4.7 cm, stable. Surrounding ground-glass, volume loss and architectural distortion appear slightly progressive. Bronchiectasis and mild volume loss in the medial aspect of the right upper lobe are noted as well. Small subpleural nodule in the right lower lobe measures 10 mm (image 45), stable. Subpleural reticular changes in the left lung are unchanged. No left pleural fluid. Airway is otherwise unremarkable.  CT ABDOMEN AND PELVIS FINDINGS  Hepatobiliary: Liver is unremarkable. Cholecystectomy. No biliary ductal dilatation.  Pancreas: Tiny calcifications are seen in the uncinate process. Pancreas is otherwise negative.  Spleen: Negative.  Adrenals/Urinary Tract: Adrenal glands are unremarkable. Tiny stone in the lower pole right kidney. Ureters are minimally prominent, similar to the prior exam. Bladder is unremarkable.  Stomach/Bowel: Stomach is unremarkable. Duodenal diverticulum is incidentally noted. Small bowel, appendix and colon are otherwise unremarkable.  Vascular/Lymphatic: Atherosclerotic calcification of the arterial vasculature without abdominal aortic aneurysm. No pathologically enlarged lymph nodes.  Reproductive: Prostate is minimally enlarged.  Other: No free fluid.  Mesenteries and peritoneum are unremarkable.  Musculoskeletal: No worrisome lytic or sclerotic lesions. Degenerative  changes are seen in the spine.  IMPRESSION: 1. Cavitary right lower lobe mass is stable in size. Surrounding architectural distortion and volume loss have progressed slightly in the interval and are likely treatment related. No evidence of distant metastatic disease. 2. Small right pleural effusion, slightly increased. 3. Coronary artery calcification. 4. Mild subpleural fibrotic changes in the left lung. 5. Mild changes of chronic calcific pancreatitis. 6. Tiny  right renal stone. 7. Mild prostate enlargement.   Electronically Signed   By: Lorin Picket M.D.   On: 11/20/2013 11:05   Ir Fluoro Guide Cv Line Right  11/17/2013   CLINICAL DATA:  LUNG CANCER, ACCESS FOR CHEMOTHERAPY  EXAM: RIGHT INTERNAL JUGULAR SINGLE LUMEN POWER PORT CATHETER INSERTION  Date:  11/3/201511/03/2013 3:59 pm  Radiologist:  M. Daryll Brod, MD  Guidance:  Ultrasound and fluoroscopic  FLUOROSCOPY TIME:  42 seconds  MEDICATIONS AND MEDICAL HISTORY: 2 g Ancefadministered within 1 hour of the procedure.4 mg Versed, 100 mcg fentanyl  ANESTHESIA/SEDATION: 35 min  CONTRAST:  None.  COMPLICATIONS: None immediate  PROCEDURE: Informed consent was obtained from the patient following explanation of the procedure, risks, benefits and alternatives. The patient understands, agrees and consents for the procedure. All questions were addressed. A time out was performed.  Maximal barrier sterile technique utilized including caps, mask, sterile gowns, sterile gloves, large sterile drape, hand hygiene, and 2% chlorhexidine scrub.  Under sterile conditions and local anesthesia, right internal jugular micropuncture venous access was performed. Access was performed with ultrasound. Images were obtained for documentation. A guide wire was inserted followed by a transitional dilator. This allowed insertion of a guide wire and catheter into the IVC. Measurements were obtained from the SVC / RA junction back to the bright IJ venotomy site. In the right infraclavicular chest, a subcutaneous pocket was created over the second anterior rib. This was done under sterile conditions and local anesthesia. 1% lidocaine with epinephrine was utilized for this. A 2.5 cm incision was made in the skin. Blunt dissection was performed to create a subcutaneous pocket over the right pectoralis major muscle. The pocket was flushed with saline vigorously. There was adequate hemostasis. The port catheter was assembled and checked for leakage.  The port catheter was secured in the pocket with two retention sutures. The tubing was tunneled subcutaneously to the right venotomy site and inserted into the SVC/RA junction through a valved peel-away sheath. Position was confirmed with fluoroscopy. Images were obtained for documentation. The patient tolerated the procedure well. No immediate complications. Incisions were closed in a two layer fashion with 4 - 0 Vicryl suture. Dermabond was applied to the skin. The port catheter was accessed, blood was aspirated followed by saline and heparin flushes. Needle was removed. A dry sterile dressing was applied.  IMPRESSION: Ultrasound and fluoroscopically guided right internal jugular single lumen power port catheter insertion. Tip in the SVC/RA junction. Catheter ready for use.   Electronically Signed   By: Daryll Brod M.D.   On: 11/17/2013 16:08   Ir US Guide Vasc Access Right  11/17/2013   CLINICAL DATA:  LUNG CANCER, ACCESS FOR CHEMOTHERAPY  EXAM: RIGHT INTERNAL JUGULAR SINGLE LUMEN POWER PORT CATHETER INSERTION  Date:  11/3/201511/03/2013 3:59 pm  Radiologist:  M. Daryll Brod, MD  Guidance:  Ultrasound and fluoroscopic  FLUOROSCOPY TIME:  42 seconds  MEDICATIONS AND MEDICAL HISTORY: 2 g Ancefadministered within 1 hour of the procedure.4 mg Versed, 100 mcg fentanyl  ANESTHESIA/SEDATION: 35 min  CONTRAST:  None.  COMPLICATIONS: None  immediate  PROCEDURE: Informed consent was obtained from the patient following explanation of the procedure, risks, benefits and alternatives. The patient understands, agrees and consents for the procedure. All questions were addressed. A time out was performed.  Maximal barrier sterile technique utilized including caps, mask, sterile gowns, sterile gloves, large sterile drape, hand hygiene, and 2% chlorhexidine scrub.  Under sterile conditions and local anesthesia, right internal jugular micropuncture venous access was performed. Access was performed with ultrasound. Images were  obtained for documentation. A guide wire was inserted followed by a transitional dilator. This allowed insertion of a guide wire and catheter into the IVC. Measurements were obtained from the SVC / RA junction back to the bright IJ venotomy site. In the right infraclavicular chest, a subcutaneous pocket was created over the second anterior rib. This was done under sterile conditions and local anesthesia. 1% lidocaine with epinephrine was utilized for this. A 2.5 cm incision was made in the skin. Blunt dissection was performed to create a subcutaneous pocket over the right pectoralis major muscle. The pocket was flushed with saline vigorously. There was adequate hemostasis. The port catheter was assembled and checked for leakage. The port catheter was secured in the pocket with two retention sutures. The tubing was tunneled subcutaneously to the right venotomy site and inserted into the SVC/RA junction through a valved peel-away sheath. Position was confirmed with fluoroscopy. Images were obtained for documentation. The patient tolerated the procedure well. No immediate complications. Incisions were closed in a two layer fashion with 4 - 0 Vicryl suture. Dermabond was applied to the skin. The port catheter was accessed, blood was aspirated followed by saline and heparin flushes. Needle was removed. A dry sterile dressing was applied.  IMPRESSION: Ultrasound and fluoroscopically guided right internal jugular single lumen power port catheter insertion. Tip in the SVC/RA junction. Catheter ready for use.   Electronically Signed   By: Daryll Brod M.D.   On: 11/17/2013 16:08   ASSESSMENT AND PLAN: this is a very pleasant 80 years old white male with metastatic non-small cell lung cancer, adenocarcinoma with brain metastasis status post stereotactic radiotherapy to the brain lesions followed by palliative radiotherapy to the left lower lobe lung mass. The patient is currently undergoing systemic chemotherapy with  carboplatin and Alimta status post 6 cycles with stable disease and he is currently undergoing maintenance chemotherapy with single agent Alimta status post 9 cycles. He is tolerating the treatment well except for mild fatigue after his last treatment.  The recent CT scan of the chest, abdomen and pelvis showed no evidence for disease progression. I discussed the scan results with the patient today. I recommended for him to continue his current treatment with maintenance chemotherapy with single agent Alimta. He'll proceed with cycle #10 today and the patient would come back for followup visit in 3 weeks with the next cycle of his treatment. For the swelling of the lower extremities, I will order Doppler of the lower extremity to rule out deep venous thrombosis. This was later reported to be negative today. I also started the patient on Lasix 20 mg by mouth daily as needed for swelling. He was advised to call immediately if he has any concerning symptoms in the interval. The patient voices understanding of current disease status and treatment options and is in agreement with the current care plan.  All questions were answered. The patient knows to call the clinic with any problems, questions or concerns. We can certainly see the patient much sooner if  necessary.  Disclaimer: This note was dictated with voice recognition software. Similar sounding words can inadvertently be transcribed and may not be corrected upon review.

## 2013-11-24 NOTE — Telephone Encounter (Signed)
Pt confirmed labs/ov per 11/10 POF, gave pt AVS..... KJ also sch pt for doppler w/Kristen at [employer]. Pt was scheduled to do this before chemo due to tech was going to be leaving, ok per Amy to do Doppler then come back to chemo room...Marland KitchenMarland KitchenMarland Kitchen KJ

## 2013-11-24 NOTE — Patient Instructions (Signed)
[name] Discharge Instructions for Patients Receiving Chemotherapy  Today you received the following chemotherapy agents Alimta.  To help prevent nausea and vomiting after your treatment, we encourage you to take your nausea medication as directed.    If you develop nausea and vomiting that is not controlled by your nausea medication, call the clinic.   BELOW ARE SYMPTOMS THAT SHOULD BE REPORTED IMMEDIATELY:  *FEVER GREATER THAN 100.5 F  *CHILLS WITH OR WITHOUT FEVER  NAUSEA AND VOMITING THAT IS NOT CONTROLLED WITH YOUR NAUSEA MEDICATION  *UNUSUAL SHORTNESS OF BREATH  *UNUSUAL BRUISING OR BLEEDING  TENDERNESS IN MOUTH AND THROAT WITH OR WITHOUT PRESENCE OF ULCERS  *URINARY PROBLEMS  *BOWEL PROBLEMS  UNUSUAL RASH Items with * indicate a potential emergency and should be followed up as soon as possible.  Feel free to call the clinic you have any questions or concerns. The clinic phone number is [phone].

## 2013-12-15 ENCOUNTER — Encounter: Payer: Self-pay | Admitting: Physician Assistant

## 2013-12-15 ENCOUNTER — Ambulatory Visit (HOSPITAL_BASED_OUTPATIENT_CLINIC_OR_DEPARTMENT_OTHER): Payer: Medicare Other | Admitting: Physician Assistant

## 2013-12-15 ENCOUNTER — Ambulatory Visit (HOSPITAL_BASED_OUTPATIENT_CLINIC_OR_DEPARTMENT_OTHER): Payer: Medicare Other

## 2013-12-15 ENCOUNTER — Ambulatory Visit: Payer: Medicare Other

## 2013-12-15 ENCOUNTER — Other Ambulatory Visit (HOSPITAL_BASED_OUTPATIENT_CLINIC_OR_DEPARTMENT_OTHER): Payer: Medicare Other

## 2013-12-15 ENCOUNTER — Telehealth: Payer: Self-pay | Admitting: Internal Medicine

## 2013-12-15 VITALS — BP 110/47 | HR 63 | Temp 98.2°F | Resp 18 | Ht 71.0 in | Wt 160.4 lb

## 2013-12-15 DIAGNOSIS — C7931 Secondary malignant neoplasm of brain: Secondary | ICD-10-CM

## 2013-12-15 DIAGNOSIS — C3432 Malignant neoplasm of lower lobe, left bronchus or lung: Secondary | ICD-10-CM

## 2013-12-15 DIAGNOSIS — C3431 Malignant neoplasm of lower lobe, right bronchus or lung: Secondary | ICD-10-CM

## 2013-12-15 DIAGNOSIS — Z5111 Encounter for antineoplastic chemotherapy: Secondary | ICD-10-CM

## 2013-12-15 DIAGNOSIS — Z95828 Presence of other vascular implants and grafts: Secondary | ICD-10-CM

## 2013-12-15 DIAGNOSIS — C3490 Malignant neoplasm of unspecified part of unspecified bronchus or lung: Secondary | ICD-10-CM

## 2013-12-15 LAB — COMPREHENSIVE METABOLIC PANEL (CC13)
ALT: 25 U/L (ref 0–55)
AST: 29 U/L (ref 5–34)
Albumin: 3.2 g/dL — ABNORMAL LOW (ref 3.5–5.0)
Alkaline Phosphatase: 259 U/L — ABNORMAL HIGH (ref 40–150)
Anion Gap: 11 mEq/L (ref 3–11)
BILIRUBIN TOTAL: 0.5 mg/dL (ref 0.20–1.20)
BUN: 15.4 mg/dL (ref 7.0–26.0)
CO2: 26 meq/L (ref 22–29)
CREATININE: 0.9 mg/dL (ref 0.7–1.3)
Calcium: 9.4 mg/dL (ref 8.4–10.4)
Chloride: 102 mEq/L (ref 98–109)
GLUCOSE: 118 mg/dL (ref 70–140)
Potassium: 3.9 mEq/L (ref 3.5–5.1)
Sodium: 139 mEq/L (ref 136–145)
Total Protein: 6.4 g/dL (ref 6.4–8.3)

## 2013-12-15 LAB — CBC WITH DIFFERENTIAL/PLATELET
BASO%: 0.3 % (ref 0.0–2.0)
Basophils Absolute: 0 10*3/uL (ref 0.0–0.1)
EOS ABS: 0 10*3/uL (ref 0.0–0.5)
EOS%: 0 % (ref 0.0–7.0)
HEMATOCRIT: 26.2 % — AB (ref 38.4–49.9)
HGB: 8.8 g/dL — ABNORMAL LOW (ref 13.0–17.1)
LYMPH%: 8 % — AB (ref 14.0–49.0)
MCH: 33 pg (ref 27.2–33.4)
MCHC: 33.6 g/dL (ref 32.0–36.0)
MCV: 98.1 fL — ABNORMAL HIGH (ref 79.3–98.0)
MONO#: 0.7 10*3/uL (ref 0.1–0.9)
MONO%: 11.8 % (ref 0.0–14.0)
NEUT#: 4.7 10*3/uL (ref 1.5–6.5)
NEUT%: 79.9 % — AB (ref 39.0–75.0)
PLATELETS: 304 10*3/uL (ref 140–400)
RBC: 2.67 10*6/uL — ABNORMAL LOW (ref 4.20–5.82)
RDW: 15.7 % — ABNORMAL HIGH (ref 11.0–14.6)
WBC: 5.8 10*3/uL (ref 4.0–10.3)
lymph#: 0.5 10*3/uL — ABNORMAL LOW (ref 0.9–3.3)

## 2013-12-15 MED ORDER — SODIUM CHLORIDE 0.9 % IJ SOLN
10.0000 mL | INTRAMUSCULAR | Status: DC | PRN
  Administered 2013-12-15: 10 mL via INTRAVENOUS
  Filled 2013-12-15: qty 10

## 2013-12-15 MED ORDER — SODIUM CHLORIDE 0.9 % IV SOLN
Freq: Once | INTRAVENOUS | Status: AC
  Administered 2013-12-15: 11:00:00 via INTRAVENOUS

## 2013-12-15 MED ORDER — ONDANSETRON 8 MG/NS 50 ML IVPB
INTRAVENOUS | Status: AC
  Filled 2013-12-15: qty 8

## 2013-12-15 MED ORDER — HEPARIN SOD (PORK) LOCK FLUSH 100 UNIT/ML IV SOLN
500.0000 [IU] | Freq: Once | INTRAVENOUS | Status: AC | PRN
  Administered 2013-12-15: 500 [IU]
  Filled 2013-12-15: qty 5

## 2013-12-15 MED ORDER — SODIUM CHLORIDE 0.9 % IV SOLN
507.0000 mg/m2 | Freq: Once | INTRAVENOUS | Status: AC
  Administered 2013-12-15: 1000 mg via INTRAVENOUS
  Filled 2013-12-15: qty 40

## 2013-12-15 MED ORDER — DEXAMETHASONE SODIUM PHOSPHATE 10 MG/ML IJ SOLN
INTRAMUSCULAR | Status: AC
  Filled 2013-12-15: qty 1

## 2013-12-15 MED ORDER — FAMOTIDINE IN NACL 20-0.9 MG/50ML-% IV SOLN
20.0000 mg | Freq: Once | INTRAVENOUS | Status: AC
  Administered 2013-12-15: 20 mg via INTRAVENOUS

## 2013-12-15 MED ORDER — ONDANSETRON 8 MG/50ML IVPB (CHCC)
8.0000 mg | Freq: Once | INTRAVENOUS | Status: AC
  Administered 2013-12-15: 8 mg via INTRAVENOUS

## 2013-12-15 MED ORDER — FAMOTIDINE IN NACL 20-0.9 MG/50ML-% IV SOLN
INTRAVENOUS | Status: AC
  Filled 2013-12-15: qty 50

## 2013-12-15 MED ORDER — SODIUM CHLORIDE 0.9 % IJ SOLN
10.0000 mL | INTRAMUSCULAR | Status: DC | PRN
  Administered 2013-12-15: 10 mL
  Filled 2013-12-15: qty 10

## 2013-12-15 MED ORDER — DEXAMETHASONE SODIUM PHOSPHATE 10 MG/ML IJ SOLN
10.0000 mg | Freq: Once | INTRAMUSCULAR | Status: AC
  Administered 2013-12-15: 10 mg via INTRAVENOUS

## 2013-12-15 NOTE — Progress Notes (Addendum)
[place] [phone]   [phone]  OFFICE PROGRESS NOTE  [name], [place] [address]  DIAGNOSIS: Stage IV ( T2b, N2, M1b) non-small cell lung cancer, adenocarcinoma with areas of squamous differentiation with negative EGFR mutation and negative ALK gene translocation, presented with left lower lobe lung mass in addition to mediastinal lymphadenopathy and brain metastases diagnosed in October of 2014  PRIOR THERAPY: 1) Stereotactic radiotherapy to 3 brain lesions under the care of Dr. Lisbeth Renshaw completed 12/08/2012. 2) Palliative radiotherapy to the left lower lobe lung mass under the care of Dr. Lisbeth Renshaw expected to be completed 01/01/2013. 3) Systemic chemotherapy with carboplatin for AUC of 5 and Alimta 500 mg/M2 every 3 weeks. First dose 12/23/2012. Status post 6 cycles  CURRENT THERAPY: Maintenance chemotherapy with single agent Alimta at 500 mg per meter squared given every 3 weeks. First cycle expected to be given 05/12/2013. Status post 10 cycles.   CHEMOTHERAPY INTENT: Palliative  CURRENT # OF CHEMOTHERAPY CYCLES: 11  CURRENT ANTIEMETICS:Zofran, dexamethasone and Compazine  CURRENT SMOKING STATUS: former smoker  ORAL CHEMOTHERAPY AND CONSENT: None  CURRENT BISPHOSPHONATES USE: None  PAIN MANAGEMENT: 0/10  NARCOTICS INDUCED CONSTIPATION: None  LIVING WILL AND CODE STATUS: ?  INTERVAL HISTORY: [name] 79 y.o. male returns to the clinic today for follow up visit. He has been on maintenance chemotherapy with single agent Alimta for the last 10 cycles. He tolerated the last cycle of his treatment fairly well except for increasedfatigue. He is wondering if it has anything to do with the benadryl he receives as a premedication. He reports sudden onset of dizziness and gait disturbance. He is unsteady on his feet and is using a cane or walker at home for more stability. He denies any blurred or double vision.   The patient denied having any nausea or vomiting. He reports that he is due for a restaging MRI of the brain for Dr. Lisbeth Renshaw and Dr. Vertell Limber on 01/01/2014. He denied having any fever or chills. He has no chest pain, shortness of breath, cough or hemoptysis. He complains of having no appetite. His weight is down approximately 3 pounds from his office visit on 11/24/2013.  He has no night sweats.    MEDICAL HISTORY: Past Medical History  Diagnosis Date  . COPD (chronic obstructive pulmonary disease)   . Allergy     allergic rhinitis  . Hx of radiation therapy 12/09/12-01/01/13    lung 37.5Gy  . nscl ca w/ brain mets dx'd 08/2012    Patient has a lung mass which is being evaluated    ALLERGIES:  is allergic to sulfa antibiotics.  MEDICATIONS:  Current Outpatient Prescriptions  Medication Sig Dispense Refill  . acetaminophen (TYLENOL) 500 MG tablet Take 500 mg by mouth every 6 (six) hours as needed for mild pain.     . clobetasol cream (TEMOVATE) 9.39 % Apply 1 application topically daily as needed (for itching).    Marland Kitchen dexamethasone (DECADRON) 4 MG tablet 4 mg by mouth twice a day the day before, day of and day after the chemotherapy every 3 weeks 40 tablet 1  . ferrous sulfate (IRON SUPPLEMENT) 325 (65 FE) MG tablet Take 325 mg by mouth daily with breakfast.    . folic acid (FOLVITE) 030 MCG tablet Take 400 mcg by mouth every morning.    . furosemide (LASIX) 20 MG tablet 1 tablet by mouth daily as needed for swelling of the  lower extremities. 30 tablet 0  . Polyethyl Glycol-Propyl Glycol (SYSTANE OP) Place 1 drop into both eyes daily.    . simvastatin (ZOCOR) 10 MG tablet Take 10 mg by mouth at bedtime.    . benzonatate (TESSALON) 200 MG capsule Take 200 mg by mouth 3 (three) times daily as needed for cough.    . loratadine (ALLERGY RELIEF) 10 MG tablet Take 10 mg by mouth daily as needed for allergies.     Marland Kitchen ondansetron (ZOFRAN) 4 MG tablet Take 4 mg by mouth every 6 (six) hours as needed for  nausea or vomiting.    . prochlorperazine (COMPAZINE) 10 MG tablet Take 1 tablet (10 mg total) by mouth every 6 (six) hours as needed for nausea or vomiting. (Patient not taking: Reported on 12/15/2013) 60 tablet 0   No current facility-administered medications for this visit.    SURGICAL HISTORY:  Past Surgical History  Procedure Laterality Date  . Cholecystectomy      REVIEW OF SYSTEMS:  Constitutional: negative Eyes: negative Ears, nose, mouth, throat, and face: negative Respiratory: negative Cardiovascular: negative Gastrointestinal: negative Genitourinary:negative Integument/breast: negative Hematologic/lymphatic: negative Musculoskeletal:negative Neurological: negative Behavioral/Psych: negative Endocrine: negative Allergic/Immunologic: negative   PHYSICAL EXAMINATION: General appearance: alert, cooperative and no distress Head: Normocephalic, without obvious abnormality, atraumatic Neck: no adenopathy, no JVD, supple, symmetrical, trachea midline and thyroid not enlarged, symmetric, no tenderness/mass/nodules Lymph nodes: Cervical, supraclavicular, and axillary nodes normal. Resp: clear to auscultation bilaterally Back: symmetric, no curvature. ROM normal. No CVA tenderness. Cardio: regular rate and rhythm, S1, S2 normal, no murmur, click, rub or gallop GI: soft, non-tender; bowel sounds normal; no masses,  no organomegaly Extremities: edema 2+ Neurologic: Alert and oriented X 3, normal strength and tone. Normal symmetric reflexes. Normal coordination and gait  ECOG PERFORMANCE STATUS: 1 - Symptomatic but completely ambulatory  Blood pressure 110/47, pulse 63, temperature 98.2 F (36.8 C), temperature source Oral, resp. rate 18, height $RemoveBe'5\' 11"'mmBiEMppZ$  (1.803 m), weight 160 lb 6.4 oz (72.757 kg), SpO2 100 %.  LABORATORY DATA: Lab Results  Component Value Date   WBC 5.8 12/15/2013   HGB 8.8* 12/15/2013   HCT 26.2* 12/15/2013   MCV 98.1* 12/15/2013   PLT 304 12/15/2013       Chemistry      Component Value Date/Time   NA 139 12/15/2013 0919   NA 141 08/25/2013 1000   NA CANCELED 10/13/2012 1149   K 3.9 12/15/2013 0919   K 3.9 08/25/2013 1000   CL 102 08/25/2013 1000   CO2 26 12/15/2013 0919   CO2 24 08/25/2013 1000   BUN 15.4 12/15/2013 0919   BUN 11 08/25/2013 1000   BUN CANCELED 10/13/2012 1149   CREATININE 0.9 12/15/2013 0919   CREATININE 0.76 08/25/2013 1000      Component Value Date/Time   CALCIUM 9.4 12/15/2013 0919   CALCIUM 9.4 08/25/2013 1000   ALKPHOS 259* 12/15/2013 0919   ALKPHOS 185* 08/25/2013 1000   AST 29 12/15/2013 0919   AST 31 08/25/2013 1000   ALT 25 12/15/2013 0919   ALT 20 08/25/2013 1000   BILITOT 0.50 12/15/2013 0919   BILITOT 0.3 08/25/2013 1000       RADIOGRAPHIC STUDIES: Ct Chest W Contrast  11/20/2013   CLINICAL DATA:  Right lower lobe lung cancer, metastatic to brain. Ongoing chemotherapy.  EXAM: CT CHEST, ABDOMEN, AND PELVIS WITH CONTRAST  TECHNIQUE: Multidetector CT imaging of the chest, abdomen and pelvis was performed following the standard protocol during bolus administration of  intravenous contrast.  CONTRAST:  171m OMNIPAQUE IOHEXOL 300 MG/ML  SOLN  COMPARISON:  09/11/2013.  FINDINGS: CT CHEST FINDINGS  Right IJ Port-A-Cath terminates in the high right atrium. No pathologically enlarged mediastinal, left hilar or axillary lymph nodes. Atherosclerotic calcification of the arterial vasculature, including coronary arteries. Heart size normal. Small amount of apical pericardial fluid is unchanged.  Small right pleural effusion, slightly increased in size. Partially cavitary mass in the perihilar right lower lobe measures approximately 2.8 x 4.7 cm, stable. Surrounding ground-glass, volume loss and architectural distortion appear slightly progressive. Bronchiectasis and mild volume loss in the medial aspect of the right upper lobe are noted as well. Small subpleural nodule in the right lower lobe measures 10 mm  (image 45), stable. Subpleural reticular changes in the left lung are unchanged. No left pleural fluid. Airway is otherwise unremarkable.  CT ABDOMEN AND PELVIS FINDINGS  Hepatobiliary: Liver is unremarkable. Cholecystectomy. No biliary ductal dilatation.  Pancreas: Tiny calcifications are seen in the uncinate process. Pancreas is otherwise negative.  Spleen: Negative.  Adrenals/Urinary Tract: Adrenal glands are unremarkable. Tiny stone in the lower pole right kidney. Ureters are minimally prominent, similar to the prior exam. Bladder is unremarkable.  Stomach/Bowel: Stomach is unremarkable. Duodenal diverticulum is incidentally noted. Small bowel, appendix and colon are otherwise unremarkable.  Vascular/Lymphatic: Atherosclerotic calcification of the arterial vasculature without abdominal aortic aneurysm. No pathologically enlarged lymph nodes.  Reproductive: Prostate is minimally enlarged.  Other: No free fluid.  Mesenteries and peritoneum are unremarkable.  Musculoskeletal: No worrisome lytic or sclerotic lesions. Degenerative changes are seen in the spine.  IMPRESSION: 1. Cavitary right lower lobe mass is stable in size. Surrounding architectural distortion and volume loss have progressed slightly in the interval and are likely treatment related. No evidence of distant metastatic disease. 2. Small right pleural effusion, slightly increased. 3. Coronary artery calcification. 4. Mild subpleural fibrotic changes in the left lung. 5. Mild changes of chronic calcific pancreatitis. 6. Tiny right renal stone. 7. Mild prostate enlargement.   Electronically Signed   By: MLorin PicketM.D.   On: 11/20/2013 11:05   Ct Abdomen Pelvis W Contrast  11/20/2013   CLINICAL DATA:  Right lower lobe lung cancer, metastatic to brain. Ongoing chemotherapy.  EXAM: CT CHEST, ABDOMEN, AND PELVIS WITH CONTRAST  TECHNIQUE: Multidetector CT imaging of the chest, abdomen and pelvis was performed following the standard protocol during  bolus administration of intravenous contrast.  CONTRAST:  1035mOMNIPAQUE IOHEXOL 300 MG/ML  SOLN  COMPARISON:  09/11/2013.  FINDINGS: CT CHEST FINDINGS  Right IJ Port-A-Cath terminates in the high right atrium. No pathologically enlarged mediastinal, left hilar or axillary lymph nodes. Atherosclerotic calcification of the arterial vasculature, including coronary arteries. Heart size normal. Small amount of apical pericardial fluid is unchanged.  Small right pleural effusion, slightly increased in size. Partially cavitary mass in the perihilar right lower lobe measures approximately 2.8 x 4.7 cm, stable. Surrounding ground-glass, volume loss and architectural distortion appear slightly progressive. Bronchiectasis and mild volume loss in the medial aspect of the right upper lobe are noted as well. Small subpleural nodule in the right lower lobe measures 10 mm (image 45), stable. Subpleural reticular changes in the left lung are unchanged. No left pleural fluid. Airway is otherwise unremarkable.  CT ABDOMEN AND PELVIS FINDINGS  Hepatobiliary: Liver is unremarkable. Cholecystectomy. No biliary ductal dilatation.  Pancreas: Tiny calcifications are seen in the uncinate process. Pancreas is otherwise negative.  Spleen: Negative.  Adrenals/Urinary Tract: Adrenal glands are unremarkable.  Tiny stone in the lower pole right kidney. Ureters are minimally prominent, similar to the prior exam. Bladder is unremarkable.  Stomach/Bowel: Stomach is unremarkable. Duodenal diverticulum is incidentally noted. Small bowel, appendix and colon are otherwise unremarkable.  Vascular/Lymphatic: Atherosclerotic calcification of the arterial vasculature without abdominal aortic aneurysm. No pathologically enlarged lymph nodes.  Reproductive: Prostate is minimally enlarged.  Other: No free fluid.  Mesenteries and peritoneum are unremarkable.  Musculoskeletal: No worrisome lytic or sclerotic lesions. Degenerative changes are seen in the spine.   IMPRESSION: 1. Cavitary right lower lobe mass is stable in size. Surrounding architectural distortion and volume loss have progressed slightly in the interval and are likely treatment related. No evidence of distant metastatic disease. 2. Small right pleural effusion, slightly increased. 3. Coronary artery calcification. 4. Mild subpleural fibrotic changes in the left lung. 5. Mild changes of chronic calcific pancreatitis. 6. Tiny right renal stone. 7. Mild prostate enlargement.   Electronically Signed   By: Lorin Picket M.D.   On: 11/20/2013 11:05   Ir Fluoro Guide Cv Line Right  11/17/2013   CLINICAL DATA:  LUNG CANCER, ACCESS FOR CHEMOTHERAPY  EXAM: RIGHT INTERNAL JUGULAR SINGLE LUMEN POWER PORT CATHETER INSERTION  Date:  11/3/201511/03/2013 3:59 pm  Radiologist:  M. Daryll Brod, MD  Guidance:  Ultrasound and fluoroscopic  FLUOROSCOPY TIME:  42 seconds  MEDICATIONS AND MEDICAL HISTORY: 2 g Ancefadministered within 1 hour of the procedure.4 mg Versed, 100 mcg fentanyl  ANESTHESIA/SEDATION: 35 min  CONTRAST:  None.  COMPLICATIONS: None immediate  PROCEDURE: Informed consent was obtained from the patient following explanation of the procedure, risks, benefits and alternatives. The patient understands, agrees and consents for the procedure. All questions were addressed. A time out was performed.  Maximal barrier sterile technique utilized including caps, mask, sterile gowns, sterile gloves, large sterile drape, hand hygiene, and 2% chlorhexidine scrub.  Under sterile conditions and local anesthesia, right internal jugular micropuncture venous access was performed. Access was performed with ultrasound. Images were obtained for documentation. A guide wire was inserted followed by a transitional dilator. This allowed insertion of a guide wire and catheter into the IVC. Measurements were obtained from the SVC / RA junction back to the bright IJ venotomy site. In the right infraclavicular chest, a subcutaneous  pocket was created over the second anterior rib. This was done under sterile conditions and local anesthesia. 1% lidocaine with epinephrine was utilized for this. A 2.5 cm incision was made in the skin. Blunt dissection was performed to create a subcutaneous pocket over the right pectoralis major muscle. The pocket was flushed with saline vigorously. There was adequate hemostasis. The port catheter was assembled and checked for leakage. The port catheter was secured in the pocket with two retention sutures. The tubing was tunneled subcutaneously to the right venotomy site and inserted into the SVC/RA junction through a valved peel-away sheath. Position was confirmed with fluoroscopy. Images were obtained for documentation. The patient tolerated the procedure well. No immediate complications. Incisions were closed in a two layer fashion with 4 - 0 Vicryl suture. Dermabond was applied to the skin. The port catheter was accessed, blood was aspirated followed by saline and heparin flushes. Needle was removed. A dry sterile dressing was applied.  IMPRESSION: Ultrasound and fluoroscopically guided right internal jugular single lumen power port catheter insertion. Tip in the SVC/RA junction. Catheter ready for use.   Electronically Signed   By: Daryll Brod M.D.   On: 11/17/2013 16:08   Ir US Guide  Vasc Access Right  11/17/2013   CLINICAL DATA:  LUNG CANCER, ACCESS FOR CHEMOTHERAPY  EXAM: RIGHT INTERNAL JUGULAR SINGLE LUMEN POWER PORT CATHETER INSERTION  Date:  11/3/201511/03/2013 3:59 pm  Radiologist:  M. Daryll Brod, MD  Guidance:  Ultrasound and fluoroscopic  FLUOROSCOPY TIME:  42 seconds  MEDICATIONS AND MEDICAL HISTORY: 2 g Ancefadministered within 1 hour of the procedure.4 mg Versed, 100 mcg fentanyl  ANESTHESIA/SEDATION: 35 min  CONTRAST:  None.  COMPLICATIONS: None immediate  PROCEDURE: Informed consent was obtained from the patient following explanation of the procedure, risks, benefits and alternatives. The  patient understands, agrees and consents for the procedure. All questions were addressed. A time out was performed.  Maximal barrier sterile technique utilized including caps, mask, sterile gowns, sterile gloves, large sterile drape, hand hygiene, and 2% chlorhexidine scrub.  Under sterile conditions and local anesthesia, right internal jugular micropuncture venous access was performed. Access was performed with ultrasound. Images were obtained for documentation. A guide wire was inserted followed by a transitional dilator. This allowed insertion of a guide wire and catheter into the IVC. Measurements were obtained from the SVC / RA junction back to the bright IJ venotomy site. In the right infraclavicular chest, a subcutaneous pocket was created over the second anterior rib. This was done under sterile conditions and local anesthesia. 1% lidocaine with epinephrine was utilized for this. A 2.5 cm incision was made in the skin. Blunt dissection was performed to create a subcutaneous pocket over the right pectoralis major muscle. The pocket was flushed with saline vigorously. There was adequate hemostasis. The port catheter was assembled and checked for leakage. The port catheter was secured in the pocket with two retention sutures. The tubing was tunneled subcutaneously to the right venotomy site and inserted into the SVC/RA junction through a valved peel-away sheath. Position was confirmed with fluoroscopy. Images were obtained for documentation. The patient tolerated the procedure well. No immediate complications. Incisions were closed in a two layer fashion with 4 - 0 Vicryl suture. Dermabond was applied to the skin. The port catheter was accessed, blood was aspirated followed by saline and heparin flushes. Needle was removed. A dry sterile dressing was applied.  IMPRESSION: Ultrasound and fluoroscopically guided right internal jugular single lumen power port catheter insertion. Tip in the SVC/RA junction.  Catheter ready for use.   Electronically Signed   By: Daryll Brod M.D.   On: 11/17/2013 16:08   ASSESSMENT AND PLAN: this is a very pleasant 80 years old white male with metastatic non-small cell lung cancer, adenocarcinoma with brain metastasis status post stereotactic radiotherapy to the brain lesions followed by palliative radiotherapy to the left lower lobe lung mass. The patient is currently undergoing systemic chemotherapy with carboplatin and Alimta status post 6 cycles with stable disease and he is currently undergoing maintenance chemotherapy with single agent Alimta status post 9 cycles. He is tolerating the treatment well except for mild fatigue after his last treatment.  The recent CT scan of the chest, abdomen and pelvis showed no evidence for disease progression. The patient was discussed with and also seen by Dr. Julien Nordmann. His dizziness and recent gait disturbance is concerning. We will contact radiation oncology about moving up the restaging MRI of his brain to an earlier date to evaluate for possible brain metastasis.  He'll proceed with cycle #11 today as scheduled. We will discontinue benadryl from his premedications due to intolerance. He will follow up in 3 weeks, prior to cycle # 12.  He  was advised to call immediately if he has any concerning symptoms in the interval. The patient voices understanding of current disease status and treatment options and is in agreement with the current care plan.  All questions were answered. The patient knows to call the clinic with any problems, questions or concerns. We can certainly see the patient much sooner if necessary.  Preston Adam, PA-C 12/15/2013  ADDENDUM: Hematology/Oncology Attending: I had a face to face encounter with the patient today. I recommended his care plan. This is a very pleasant 80 years old white male with metastatic non-small cell lung cancer, adenocarcinoma. He is currently undergoing maintenance chemotherapy  with single agent Alimta is status post 10 cycles. The patient is tolerating his treatment fairly well with no significant adverse effects. He noticed recently few episodes of dizzy spells and gait disturbance. This is completely resolved at this point. The patient is as scheduled by radiation oncology to have restaging MRI of the brain within 2 weeks. We will consult with him to see if we can move the MRI earlier. He will continue his systemic chemotherapy with single agent Alimta as scheduled. He will receive cycle #11 today. The patient would come back for follow-up visit in 3 weeks with the next cycle of his treatment. He was advised to call immediately if he has any concerning symptoms in the interval.  Disclaimer: This note was dictated with voice recognition software. Similar sounding words can inadvertently be transcribed and may not be corrected upon review. Preston Kempf., MD 12/15/2013

## 2013-12-15 NOTE — Patient Instructions (Signed)
Due to your unsteady gait, we will work with radiation oncology to get your MRI of the brain rescheduled to an earlier date Follow up in 3 weeks, prior to your next scheduled cycle of maintenance chemotherapy

## 2013-12-15 NOTE — Patient Instructions (Signed)
[facility] Discharge Instructions for Patients Receiving Chemotherapy  Today you received the following chemotherapy agents Alimta  To help prevent nausea and vomiting after your treatment, we encourage you to take your nausea medication as prescribed/directed.   If you develop nausea and vomiting that is not controlled by your nausea medication, call the clinic.   BELOW ARE SYMPTOMS THAT SHOULD BE REPORTED IMMEDIATELY:  *FEVER GREATER THAN 100.5 F  *CHILLS WITH OR WITHOUT FEVER  NAUSEA AND VOMITING THAT IS NOT CONTROLLED WITH YOUR NAUSEA MEDICATION  *UNUSUAL SHORTNESS OF BREATH  *UNUSUAL BRUISING OR BLEEDING  TENDERNESS IN MOUTH AND THROAT WITH OR WITHOUT PRESENCE OF ULCERS  *URINARY PROBLEMS  *BOWEL PROBLEMS  UNUSUAL RASH Items with * indicate a potential emergency and should be followed up as soon as possible.  Feel free to call the clinic you have any questions or concerns. The clinic phone number is [phone].

## 2013-12-15 NOTE — Progress Notes (Signed)
Pt refused benadryl  States he does not like how it makes him feel.

## 2013-12-15 NOTE — Telephone Encounter (Signed)
gv adn printed appt sched and avs for pt for DEC....sed added tx.

## 2013-12-16 ENCOUNTER — Ambulatory Visit
Admission: RE | Admit: 2013-12-16 | Discharge: 2013-12-16 | Disposition: A | Discharge status: Home / Self Care | Payer: Medicare Other | Source: Ambulatory Visit | Attending: Radiation Oncology | Admitting: Radiation Oncology

## 2013-12-16 DIAGNOSIS — C7931 Secondary malignant neoplasm of brain: Secondary | ICD-10-CM

## 2013-12-16 MED ORDER — GADOBENATE DIMEGLUMINE 529 MG/ML IV SOLN
15.0000 mL | Freq: Once | INTRAVENOUS | Status: AC | PRN
  Administered 2013-12-16: 15 mL via INTRAVENOUS

## 2013-12-20 ENCOUNTER — Other Ambulatory Visit: Payer: Self-pay | Admitting: Internal Medicine

## 2013-12-21 ENCOUNTER — Ambulatory Visit
Admission: RE | Admit: 2013-12-21 | Discharge: 2013-12-21 | Disposition: A | Discharge status: Home / Self Care | Payer: Medicare Other | Source: Ambulatory Visit | Attending: Radiation Oncology | Admitting: Radiation Oncology

## 2013-12-21 ENCOUNTER — Ambulatory Visit
Admission: RE | Admit: 2013-12-21 | Discharge: 2013-12-21 | Disposition: A | Discharge status: Home / Self Care | Payer: Medicare Other | Source: Ambulatory Visit | Attending: Neurosurgery | Admitting: Neurosurgery

## 2013-12-21 VITALS — BP 117/76 | HR 120 | Temp 97.6°F | Resp 20

## 2013-12-21 DIAGNOSIS — C7931 Secondary malignant neoplasm of brain: Secondary | ICD-10-CM

## 2013-12-21 NOTE — Progress Notes (Signed)
Follow up , here for MRI rEsults brain,  Patient c/o of being dizzy sine Thanksgiving, stay ed in bed that Thursday and Friday, took orthostatic vitals, sitting b/p=132/75,P=111,RR=20 T=97.6 Standing b/p=117/76, P=120, willget med list after Dr. Lisbeth Renshaw and Dr. Vertell Limber sees patient  8:27 AM

## 2013-12-22 ENCOUNTER — Encounter: Payer: Self-pay | Admitting: Radiation Oncology

## 2013-12-22 ENCOUNTER — Ambulatory Visit: Payer: Medicare Other

## 2013-12-22 NOTE — Progress Notes (Signed)
Radiation Oncology         [phone] ________________________________  Name: [name] MRN: [id number]  Date: 12/21/2013  DOB: 07-03-1933  Follow-Up Visit Note  CC: [name], MD  [name], MD  Diagnosis:   Metastatic lung cancer  Interval Since Last Radiation:  Approximately 1 year   Narrative:  The patient returns today for routine follow-up.  The patient states that he has been doing well overall. His primary complaint was of some dizziness which is been going on for a couple of weeks. The patient therefore had his brain MRI scan moved to an earlier date. This was completed on 12/16/2013 and the patient comes in to discuss this today.                              ALLERGIES:  is allergic to sulfa antibiotics.  Meds: Current Outpatient Prescriptions  Medication Sig Dispense Refill  . acetaminophen (TYLENOL) 500 MG tablet Take 500 mg by mouth every 6 (six) hours as needed for mild pain.     . benzonatate (TESSALON) 200 MG capsule Take 200 mg by mouth 3 (three) times daily as needed for cough.    . clobetasol cream (TEMOVATE) 9.38 % Apply 1 application topically daily as needed (for itching).    Marland Kitchen dexamethasone (DECADRON) 4 MG tablet 4 mg by mouth twice a day the day before, day of and day after the chemotherapy every 3 weeks 40 tablet 1  . ferrous sulfate (IRON SUPPLEMENT) 325 (65 FE) MG tablet Take 325 mg by mouth daily with breakfast.    . folic acid (FOLVITE) 182 MCG tablet Take 400 mcg by mouth every morning.    . furosemide (LASIX) 20 MG tablet 1 TABLET BY MOUTH DAILY AS NEEDED FOR SWELLING OF THE LOWER EXTREMITIES. 30 tablet 0  . loratadine (ALLERGY RELIEF) 10 MG tablet Take 10 mg by mouth daily as needed for allergies.     Marland Kitchen ondansetron (ZOFRAN) 4 MG tablet Take 4 mg by mouth every 6 (six) hours as needed for nausea or vomiting.    Vladimir Faster Glycol-Propyl Glycol (SYSTANE OP) Place 1 drop into both eyes daily.    . prochlorperazine (COMPAZINE) 10 MG tablet  Take 1 tablet (10 mg total) by mouth every 6 (six) hours as needed for nausea or vomiting. (Patient not taking: Reported on 12/15/2013) 60 tablet 0  . simvastatin (ZOCOR) 10 MG tablet Take 10 mg by mouth at bedtime.     No current facility-administered medications for this encounter.    Physical Findings: The patient is in no acute distress. Patient is alert and oriented.  oral temperature is 97.6 F (36.4 C). His blood pressure is 117/76 and his pulse is 120. His respiration is 20. .     Lab Findings: Lab Results  Component Value Date   WBC 5.8 12/15/2013   HGB 8.8* 12/15/2013   HCT 26.2* 12/15/2013   MCV 98.1* 12/15/2013   PLT 304 12/15/2013     Radiographic Findings: Mr [name] XH Contrast  12/16/2013   CLINICAL DATA:  Lung cancer. Brain metastases. Status post radiation therapy.  EXAM: MRI HEAD WITHOUT AND WITH CONTRAST  TECHNIQUE: Multiplanar, multiecho pulse sequences of the brain and surrounding structures were obtained without and with intravenous contrast.  CONTRAST:  71mL MULTIHANCE GADOBENATE DIMEGLUMINE 529 MG/ML IV SOLN  COMPARISON:  The most recent MRI scan is 10/02/2013.  FINDINGS: Three enhancing lesions  are again noted without significant interval change. A lesion in the inferior vermis measures 12 x 7 mm on the axial images. The medial left occipital lobe lesion is stable a 4.7 mm. A lesion in the posterior right frontal lobe is stable at 4 mm. No new lesions are present. No acute infarct, hemorrhage, no acute infarct or hemorrhage is evident. Flow is present in the major intracranial arteries. The globes and orbits are intact. The paranasal sinuses and mastoid air cells are clear. Periventricular and subcortical white matter changes bilaterally are stable.  IMPRESSION: 1. Stable appearance of 3 foci of enhancement as previously described, compatible with treated metastases. 2. Stable white matter disease. 3. No new lesions.   Electronically Signed   By: Lawrence Santiago M.D.    On: 12/16/2013 17:15    Impression:    The patient's MRI scan of the brain was personally reviewed and has been reviewed in brain conference this morning. No new lesions were seen and there was a stable appearance of 3 foci of enhancement. Overall this was felt to be a very good report in conference today. We discussed with the patient today that there is no sign of a cause of any dizziness or other problems from intracranial disease. He will further monitor this issue and will further discuss this with medical oncology and his primary care physician. The patient was very pleased with the result today.  Plan:  The patient will continue with a repeat MRI scan of the brain in 3 months.  I spent 10 minutes with the patient today, the majority of which was spent counseling the patient on the diagnosis of cancer and coordinating care.   Jodelle Gross, M.D., Ph.D.

## 2013-12-30 ENCOUNTER — Telehealth: Payer: Self-pay | Admitting: Nutrition

## 2013-12-30 NOTE — Telephone Encounter (Signed)
error 

## 2013-12-30 NOTE — Telephone Encounter (Signed)
Patient identified to be at risk for malnutrition on the MST. Contacted patient at home however, he was unavailable.  I spoke with his wife who reports patient eats very small amounts. She feels patient is depressed and should talk to a counselor, however, patient refuses. Patient's wife interested in possible counseling through support services team.  Will refer to social worker. Educated patient's wife to offer patient small, frequent meals and snacks. Will mail sheets and coupons to patient.  Contact information was provided.  **Disclaimer: This note was dictated with voice recognition software. Similar sounding words can inadvertently be transcribed and this note may contain transcription errors which may not have been corrected upon publication of note.**

## 2014-01-01 ENCOUNTER — Other Ambulatory Visit: Payer: Medicare Other

## 2014-01-04 ENCOUNTER — Ambulatory Visit: Payer: Medicare Other | Admitting: Radiation Oncology

## 2014-01-04 ENCOUNTER — Ambulatory Visit: Payer: Medicare Other

## 2014-01-05 ENCOUNTER — Ambulatory Visit: Payer: Medicare Other

## 2014-01-05 ENCOUNTER — Ambulatory Visit (HOSPITAL_BASED_OUTPATIENT_CLINIC_OR_DEPARTMENT_OTHER): Payer: Medicare Other

## 2014-01-05 ENCOUNTER — Ambulatory Visit (HOSPITAL_COMMUNITY)
Admission: RE | Admit: 2014-01-05 | Discharge: 2014-01-05 | Disposition: A | Discharge status: Home / Self Care | Payer: Medicare Other | Source: Ambulatory Visit | Attending: Internal Medicine | Admitting: Internal Medicine

## 2014-01-05 ENCOUNTER — Ambulatory Visit (HOSPITAL_BASED_OUTPATIENT_CLINIC_OR_DEPARTMENT_OTHER): Payer: Medicare Other | Admitting: Physician Assistant

## 2014-01-05 ENCOUNTER — Encounter: Payer: Self-pay | Admitting: Physician Assistant

## 2014-01-05 ENCOUNTER — Telehealth: Payer: Self-pay | Admitting: Internal Medicine

## 2014-01-05 VITALS — BP 130/74 | HR 85 | Temp 97.8°F | Resp 19 | Ht 71.0 in | Wt 161.2 lb

## 2014-01-05 VITALS — BP 114/65 | HR 67 | Temp 98.0°F | Resp 20

## 2014-01-05 DIAGNOSIS — C3431 Malignant neoplasm of lower lobe, right bronchus or lung: Secondary | ICD-10-CM

## 2014-01-05 DIAGNOSIS — D6481 Anemia due to antineoplastic chemotherapy: Secondary | ICD-10-CM

## 2014-01-05 DIAGNOSIS — T451X5A Adverse effect of antineoplastic and immunosuppressive drugs, initial encounter: Principal | ICD-10-CM

## 2014-01-05 DIAGNOSIS — Z95828 Presence of other vascular implants and grafts: Secondary | ICD-10-CM

## 2014-01-05 DIAGNOSIS — C3490 Malignant neoplasm of unspecified part of unspecified bronchus or lung: Secondary | ICD-10-CM

## 2014-01-05 DIAGNOSIS — Z5111 Encounter for antineoplastic chemotherapy: Secondary | ICD-10-CM

## 2014-01-05 DIAGNOSIS — C781 Secondary malignant neoplasm of mediastinum: Secondary | ICD-10-CM

## 2014-01-05 DIAGNOSIS — C7931 Secondary malignant neoplasm of brain: Secondary | ICD-10-CM

## 2014-01-05 LAB — COMPREHENSIVE METABOLIC PANEL (CC13)
ALT: 20 U/L (ref 0–55)
ANION GAP: 10 meq/L (ref 3–11)
AST: 32 U/L (ref 5–34)
Albumin: 2.9 g/dL — ABNORMAL LOW (ref 3.5–5.0)
Alkaline Phosphatase: 195 U/L — ABNORMAL HIGH (ref 40–150)
BUN: 16.9 mg/dL (ref 7.0–26.0)
CALCIUM: 8.7 mg/dL (ref 8.4–10.4)
CHLORIDE: 101 meq/L (ref 98–109)
CO2: 27 meq/L (ref 22–29)
Creatinine: 1.1 mg/dL (ref 0.7–1.3)
EGFR: 61 mL/min/{1.73_m2} — ABNORMAL LOW (ref >90)
Glucose: 113 mg/dl (ref 70–140)
Potassium: 3.5 mEq/L (ref 3.5–5.1)
Sodium: 138 mEq/L (ref 136–145)
Total Bilirubin: 0.42 mg/dL (ref 0.20–1.20)
Total Protein: 6 g/dL — ABNORMAL LOW (ref 6.4–8.3)

## 2014-01-05 LAB — CBC WITH DIFFERENTIAL/PLATELET
BASO%: 0.3 % (ref 0.0–2.0)
BASOS ABS: 0 10*3/uL (ref 0.0–0.1)
EOS%: 0 % (ref 0.0–7.0)
Eosinophils Absolute: 0 10*3/uL (ref 0.0–0.5)
HCT: 23.4 % — ABNORMAL LOW (ref 38.4–49.9)
HEMOGLOBIN: 8 g/dL — AB (ref 13.0–17.1)
LYMPH#: 0.4 10*3/uL — AB (ref 0.9–3.3)
LYMPH%: 6.6 % — ABNORMAL LOW (ref 14.0–49.0)
MCH: 33.1 pg (ref 27.2–33.4)
MCHC: 34.1 g/dL (ref 32.0–36.0)
MCV: 97 fL (ref 79.3–98.0)
MONO#: 0.7 10*3/uL (ref 0.1–0.9)
MONO%: 12.2 % (ref 0.0–14.0)
NEUT%: 80.9 % — AB (ref 39.0–75.0)
NEUTROS ABS: 4.6 10*3/uL (ref 1.5–6.5)
Platelets: 314 10*3/uL (ref 140–400)
RBC: 2.41 10*6/uL — ABNORMAL LOW (ref 4.20–5.82)
RDW: 16.8 % — ABNORMAL HIGH (ref 11.0–14.6)
WBC: 5.7 10*3/uL (ref 4.0–10.3)

## 2014-01-05 LAB — PREPARE RBC (CROSSMATCH)

## 2014-01-05 LAB — ABO/RH: ABO/RH(D): O POS

## 2014-01-05 MED ORDER — ONDANSETRON 8 MG/50ML IVPB (CHCC)
8.0000 mg | Freq: Once | INTRAVENOUS | Status: AC
  Administered 2014-01-05: 8 mg via INTRAVENOUS

## 2014-01-05 MED ORDER — HEPARIN SOD (PORK) LOCK FLUSH 100 UNIT/ML IV SOLN
500.0000 [IU] | Freq: Every day | INTRAVENOUS | Status: AC | PRN
  Administered 2014-01-05: 500 [IU]
  Filled 2014-01-05: qty 5

## 2014-01-05 MED ORDER — SODIUM CHLORIDE 0.9 % IJ SOLN
10.0000 mL | INTRAMUSCULAR | Status: AC | PRN
  Administered 2014-01-05: 10 mL
  Filled 2014-01-05: qty 10

## 2014-01-05 MED ORDER — SODIUM CHLORIDE 0.9 % IJ SOLN
10.0000 mL | INTRAMUSCULAR | Status: DC | PRN
  Administered 2014-01-05: 10 mL via INTRAVENOUS
  Filled 2014-01-05: qty 10

## 2014-01-05 MED ORDER — DEXAMETHASONE SODIUM PHOSPHATE 10 MG/ML IJ SOLN
INTRAMUSCULAR | Status: AC
  Filled 2014-01-05: qty 1

## 2014-01-05 MED ORDER — SODIUM CHLORIDE 0.9 % IV SOLN
Freq: Once | INTRAVENOUS | Status: AC
  Administered 2014-01-05: 13:00:00 via INTRAVENOUS

## 2014-01-05 MED ORDER — CYANOCOBALAMIN 1000 MCG/ML IJ SOLN
INTRAMUSCULAR | Status: AC
  Filled 2014-01-05: qty 1

## 2014-01-05 MED ORDER — DIPHENHYDRAMINE HCL 25 MG PO CAPS
25.0000 mg | ORAL_CAPSULE | Freq: Once | ORAL | Status: AC
  Administered 2014-01-05: 25 mg via ORAL

## 2014-01-05 MED ORDER — DEXAMETHASONE SODIUM PHOSPHATE 10 MG/ML IJ SOLN
10.0000 mg | Freq: Once | INTRAMUSCULAR | Status: AC
  Administered 2014-01-05: 10 mg via INTRAVENOUS

## 2014-01-05 MED ORDER — DIPHENHYDRAMINE HCL 25 MG PO CAPS
ORAL_CAPSULE | ORAL | Status: AC
  Filled 2014-01-05: qty 1

## 2014-01-05 MED ORDER — SODIUM CHLORIDE 0.9 % IV SOLN
250.0000 mL | Freq: Once | INTRAVENOUS | Status: AC
  Administered 2014-01-05: 250 mL via INTRAVENOUS

## 2014-01-05 MED ORDER — SODIUM CHLORIDE 0.9 % IV SOLN
510.0000 mg/m2 | Freq: Once | INTRAVENOUS | Status: AC
  Administered 2014-01-05: 1000 mg via INTRAVENOUS
  Filled 2014-01-05: qty 40

## 2014-01-05 MED ORDER — HEPARIN SOD (PORK) LOCK FLUSH 100 UNIT/ML IV SOLN
500.0000 [IU] | Freq: Once | INTRAVENOUS | Status: DC | PRN
  Filled 2014-01-05: qty 5

## 2014-01-05 MED ORDER — ONDANSETRON 8 MG/NS 50 ML IVPB
INTRAVENOUS | Status: AC
  Filled 2014-01-05: qty 8

## 2014-01-05 MED ORDER — FAMOTIDINE IN NACL 20-0.9 MG/50ML-% IV SOLN
20.0000 mg | Freq: Once | INTRAVENOUS | Status: AC
  Administered 2014-01-05: 20 mg via INTRAVENOUS

## 2014-01-05 MED ORDER — CYANOCOBALAMIN 1000 MCG/ML IJ SOLN
1000.0000 ug | Freq: Once | INTRAMUSCULAR | Status: AC
  Administered 2014-01-05: 1000 ug via INTRAMUSCULAR

## 2014-01-05 MED ORDER — FAMOTIDINE IN NACL 20-0.9 MG/50ML-% IV SOLN
INTRAVENOUS | Status: AC
  Filled 2014-01-05: qty 50

## 2014-01-05 MED ORDER — ACETAMINOPHEN 325 MG PO TABS
650.0000 mg | ORAL_TABLET | Freq: Once | ORAL | Status: AC
  Administered 2014-01-05: 650 mg via ORAL

## 2014-01-05 MED ORDER — ACETAMINOPHEN 325 MG PO TABS
ORAL_TABLET | ORAL | Status: AC
  Filled 2014-01-05: qty 2

## 2014-01-05 MED ORDER — SODIUM CHLORIDE 0.9 % IJ SOLN
10.0000 mL | INTRAMUSCULAR | Status: DC | PRN
  Filled 2014-01-05: qty 10

## 2014-01-05 NOTE — Progress Notes (Addendum)
[facility] [phone]   [phone]  OFFICE PROGRESS NOTE  [name], [place] [address]  DIAGNOSIS: Stage IV ( T2b, N2, M1b) non-small cell lung cancer, adenocarcinoma with areas of squamous differentiation with negative EGFR mutation and negative ALK gene translocation, presented with left lower lobe lung mass in addition to mediastinal lymphadenopathy and brain metastases diagnosed in October of 2014  PRIOR THERAPY: 1) Stereotactic radiotherapy to 3 brain lesions under the care of Dr. Lisbeth Renshaw completed 12/08/2012. 2) Palliative radiotherapy to the left lower lobe lung mass under the care of Dr. Lisbeth Renshaw expected to be completed 01/01/2013. 3) Systemic chemotherapy with carboplatin for AUC of 5 and Alimta 500 mg/M2 every 3 weeks. First dose 12/23/2012. Status post 6 cycles  CURRENT THERAPY: Maintenance chemotherapy with single agent Alimta at 500 mg per meter squared given every 3 weeks. First cycle expected to be given 05/12/2013. Status post 11 cycles.   CHEMOTHERAPY INTENT: Palliative  CURRENT # OF CHEMOTHERAPY CYCLES: 12  CURRENT ANTIEMETICS:Zofran, dexamethasone and Compazine  CURRENT SMOKING STATUS: former smoker  ORAL CHEMOTHERAPY AND CONSENT: None  CURRENT BISPHOSPHONATES USE: None  PAIN MANAGEMENT: 0/10  NARCOTICS INDUCED CONSTIPATION: None  LIVING WILL AND CODE STATUS: ?  INTERVAL HISTORY: [name] 80 y.o. male returns to the clinic today for follow up visit accompanied by his daughter. He has been on maintenance chemotherapy with single agent Alimta for the last 11 cycles. He tolerated the last cycle of his treatment fairly well except for increased fatigue. He also recently has noticed increased shortness of breath.   He denies any blurred or double vision.  The patient denied having any nausea or vomiting.  He denied having any fever or chills. He has no chest pain, shortness of breath, cough or  hemoptysis. He complains of having no appetite. His weight is down approximately 2 pounds from his office visit on 11/24/2013.  He has no night sweats.    MEDICAL HISTORY: Past Medical History  Diagnosis Date  . COPD (chronic obstructive pulmonary disease)   . Allergy     allergic rhinitis  . Hx of radiation therapy 12/09/12-01/01/13    lung 37.5Gy  . nscl ca w/ brain mets dx'd 08/2012    Patient has a lung mass which is being evaluated    ALLERGIES:  is allergic to sulfa antibiotics.  MEDICATIONS:  Current Outpatient Prescriptions  Medication Sig Dispense Refill  . acetaminophen (TYLENOL) 500 MG tablet Take 500 mg by mouth every 6 (six) hours as needed for mild pain.     . clobetasol cream (TEMOVATE) 6.72 % Apply 1 application topically daily as needed (for itching).    Marland Kitchen dexamethasone (DECADRON) 4 MG tablet 4 mg by mouth twice a day the day before, day of and day after the chemotherapy every 3 weeks 40 tablet 1  . ferrous sulfate (IRON SUPPLEMENT) 325 (65 FE) MG tablet Take 325 mg by mouth daily with breakfast.    . folic acid (FOLVITE) 094 MCG tablet Take 400 mcg by mouth every morning.    . furosemide (LASIX) 20 MG tablet 1 TABLET BY MOUTH DAILY AS NEEDED FOR SWELLING OF THE LOWER EXTREMITIES. 30 tablet 0  . loratadine (ALLERGY RELIEF) 10 MG tablet Take 10 mg by mouth daily as needed for allergies.     Vladimir Faster Glycol-Propyl Glycol (SYSTANE OP) Place 1 drop into both eyes daily.    . simvastatin (ZOCOR) 10 MG  tablet Take 10 mg by mouth at bedtime.    . ondansetron (ZOFRAN) 4 MG tablet Take 4 mg by mouth every 6 (six) hours as needed for nausea or vomiting.    . prochlorperazine (COMPAZINE) 10 MG tablet Take 1 tablet (10 mg total) by mouth every 6 (six) hours as needed for nausea or vomiting. (Patient not taking: Reported on 01/05/2014) 60 tablet 0   No current facility-administered medications for this visit.   Facility-Administered Medications Ordered in Other Visits    Medication Dose Route Frequency Provider Last Rate Last Dose  . heparin lock flush 100 unit/mL  500 Units Intracatheter Once PRN Curt Bears, MD      . heparin lock flush 100 unit/mL  500 Units Intracatheter Daily PRN Camden Mazzaferro E Anirudh Baiz, PA-C      . sodium chloride 0.9 % injection 10 mL  10 mL Intracatheter PRN Curt Bears, MD      . sodium chloride 0.9 % injection 10 mL  10 mL Intracatheter PRN Carlton Adam, PA-C        SURGICAL HISTORY:  Past Surgical History  Procedure Laterality Date  . Cholecystectomy      REVIEW OF SYSTEMS:  Constitutional: positive for anorexia, fatigue and weight loss Eyes: negative Ears, nose, mouth, throat, and face: negative Respiratory: positive for dyspnea on exertion Cardiovascular: negative Gastrointestinal: negative Genitourinary:negative Integument/breast: negative Hematologic/lymphatic: negative Musculoskeletal:negative Neurological: negative Behavioral/Psych: negative Endocrine: negative Allergic/Immunologic: negative   PHYSICAL EXAMINATION: General appearance: alert, cooperative and no distress Head: Normocephalic, without obvious abnormality, atraumatic Neck: no adenopathy, no JVD, supple, symmetrical, trachea midline and thyroid not enlarged, symmetric, no tenderness/mass/nodules Lymph nodes: Cervical, supraclavicular, and axillary nodes normal. Resp: clear to auscultation bilaterally Back: symmetric, no curvature. ROM normal. No CVA tenderness. Cardio: regular rate and rhythm, S1, S2 normal, no murmur, click, rub or gallop GI: soft, non-tender; bowel sounds normal; no masses,  no organomegaly Extremities: edema 1+ to 2+ Neurologic: Alert and oriented X 3, normal strength and tone. Normal symmetric reflexes. Normal coordination and gait  ECOG PERFORMANCE STATUS: 1 - Symptomatic but completely ambulatory  Blood pressure 130/74, pulse 85, temperature 97.8 F (36.6 C), temperature source Oral, resp. rate 19, height _0   (1.803 m), weight 161 lb 3.2 oz (73.12 kg), SpO2 100 %.  LABORATORY DATA: Lab Results  Component Value Date   WBC 5.7 01/05/2014   HGB 8.0* 01/05/2014   HCT 23.4* 01/05/2014   MCV 97.0 01/05/2014   PLT 314 01/05/2014      Chemistry      Component Value Date/Time   NA 138 01/05/2014 1059   NA 141 08/25/2013 1000   NA CANCELED 10/13/2012 1149   K 3.5 01/05/2014 1059   K 3.9 08/25/2013 1000   CL 102 08/25/2013 1000   CO2 27 01/05/2014 1059   CO2 24 08/25/2013 1000   BUN 16.9 01/05/2014 1059   BUN 11 08/25/2013 1000   BUN CANCELED 10/13/2012 1149   CREATININE 1.1 01/05/2014 1059   CREATININE 0.76 08/25/2013 1000      Component Value Date/Time   CALCIUM 8.7 01/05/2014 1059   CALCIUM 9.4 08/25/2013 1000   ALKPHOS 195* 01/05/2014 1059   ALKPHOS 185* 08/25/2013 1000   AST 32 01/05/2014 1059   AST 31 08/25/2013 1000   ALT 20 01/05/2014 1059   ALT 20 08/25/2013 1000   BILITOT 0.42 01/05/2014 1059   BILITOT 0.3 08/25/2013 1000       RADIOGRAPHIC STUDIES: Mr [name] NO Contrast  12/16/2013   CLINICAL DATA:  Lung cancer. Brain metastases. Status post radiation therapy.  EXAM: MRI HEAD WITHOUT AND WITH CONTRAST  TECHNIQUE: Multiplanar, multiecho pulse sequences of the brain and surrounding structures were obtained without and with intravenous contrast.  CONTRAST:  48m MULTIHANCE GADOBENATE DIMEGLUMINE 529 MG/ML IV SOLN  COMPARISON:  The most recent MRI scan is 10/02/2013.  FINDINGS: Three enhancing lesions are again noted without significant interval change. A lesion in the inferior vermis measures 12 x 7 mm on the axial images. The medial left occipital lobe lesion is stable a 4.7 mm. A lesion in the posterior right frontal lobe is stable at 4 mm. No new lesions are present. No acute infarct, hemorrhage, no acute infarct or hemorrhage is evident. Flow is present in the major intracranial arteries. The globes and orbits are intact. The paranasal sinuses and mastoid air cells are  clear. Periventricular and subcortical white matter changes bilaterally are stable.  IMPRESSION: 1. Stable appearance of 3 foci of enhancement as previously described, compatible with treated metastases. 2. Stable white matter disease. 3. No new lesions.   Electronically Signed   By: CLawrence SantiagoM.D.   On: 12/16/2013 17:15   ASSESSMENT AND PLAN: this is a very pleasant 80years old white male with metastatic non-small cell lung cancer, adenocarcinoma with brain metastasis status post stereotactic radiotherapy to the brain lesions followed by palliative radiotherapy to the left lower lobe lung mass. The patient is currently undergoing systemic chemotherapy with carboplatin and Alimta status post 6 cycles with stable disease and he is currently undergoing maintenance chemotherapy with single agent Alimta status post 9 cycles. He is tolerating the treatment well except for mild fatigue after his last treatment.  The last CT scan of the chest, abdomen and pelvis showed no evidence for disease progression. The patient was discussed with and also seen by Dr. MJulien Nordmann He was having some dizziness as well as gait disturbance. We contacted radiation oncology and the MRI of his brain was moved up in performed on 12/16/2013. There was stable appearance of 3 foci of enhancement as previously described this compatible with treated metastasis. There was stable white matter disease and no new lesions.  He'll proceed with cycle #12 today as scheduled. For the chemotherapy-induced anemia with hemoglobin of 8, as he is symptomatic we will arrange to transfuse him a total of 2 units of packed red blood cells. He will likely receive one unit of packed red blood cells today with his chemotherapy and the second unit are on 01/06/2014. He will follow-up in 3 weeks prior to cycle #13 with a restaging CT scan of the chest, abdomen and pelvis with contrast to reevaluate his disease.  He was advised to call immediately if he has any  concerning symptoms in the interval. The patient voices understanding of current disease status and treatment options and is in agreement with the current care plan.  All questions were answered. The patient knows to call the clinic with any problems, questions or concerns. We can certainly see the patient much sooner if necessary.  JCarlton Adam PA-C 01/05/2014     ADDENDUM: Hematology/Oncology Attending: I had a face to face encounter with the patient. I recommended his care plan. This is a very pleasant 80years old white male with metastatic non-small cell lung cancer, adenocarcinoma status post induction chemotherapy with carboplatin and Alimta and currently undergoing maintenance treatment with single agent Alimta status post 11 cycles. The patient is tolerating his treatment  fairly well with no significant adverse effect except for mild fatigue. He also has occasional dizzy spells but he has no evidence for disease progression and the recent MRI of the brain. I recommended for the patient to proceed with cycle #12 today as a scheduled. He will come back for follow-up visit in 3 weeks for evaluation after repeating CT scan of the chest, abdomen and pelvis for restaging of his disease. The patient was advised to call immediately if he has any concerning symptoms in the interval.  Disclaimer: This note was dictated with voice recognition software. Similar sounding words can inadvertently be transcribed and may be missed upon review. Eilleen Kempf., MD 01/09/2014

## 2014-01-05 NOTE — Patient Instructions (Addendum)
[facility] Discharge Instructions for Patients Receiving Chemotherapy  Today you received the following chemotherapy agent: Alimta   To help prevent nausea and vomiting after your treatment, we encourage you to take your nausea medication as prescribed.    If you develop nausea and vomiting that is not controlled by your nausea medication, call the clinic.   BELOW ARE SYMPTOMS THAT SHOULD BE REPORTED IMMEDIATELY:  *FEVER GREATER THAN 100.5 F  *CHILLS WITH OR WITHOUT FEVER  NAUSEA AND VOMITING THAT IS NOT CONTROLLED WITH YOUR NAUSEA MEDICATION  *UNUSUAL SHORTNESS OF BREATH  *UNUSUAL BRUISING OR BLEEDING  TENDERNESS IN MOUTH AND THROAT WITH OR WITHOUT PRESENCE OF ULCERS  *URINARY PROBLEMS  *BOWEL PROBLEMS  UNUSUAL RASH Items with * indicate a potential emergency and should be followed up as soon as possible.  Feel free to call the clinic you have any questions or concerns. The clinic phone number is [phone].   Blood Transfusion Information WHAT IS A BLOOD TRANSFUSION? A transfusion is the replacement of blood or some of its parts. Blood is made up of multiple cells which provide different functions.  Red blood cells carry oxygen and are used for blood loss replacement.  White blood cells fight against infection.  Platelets control bleeding.  Plasma helps clot blood.  Other blood products are available for specialized needs, such as hemophilia or other clotting disorders. BEFORE THE TRANSFUSION  Who gives blood for transfusions?   You may be able to donate blood to be used at a later date on yourself (autologous donation).  Relatives can be asked to donate blood. This is generally not any safer than if you have received blood from a stranger. The same precautions are taken to ensure safety when a relative's blood is donated.  Healthy volunteers who are fully evaluated to make sure their blood is safe. This is blood bank blood. Transfusion  therapy is the safest it has ever been in the practice of medicine. Before blood is taken from a donor, a complete history is taken to make sure that person has no history of diseases nor engages in risky social behavior (examples are intravenous drug use or sexual activity with multiple partners). The donor's travel history is screened to minimize risk of transmitting infections, such as malaria. The donated blood is tested for signs of infectious diseases, such as HIV and hepatitis. The blood is then tested to be sure it is compatible with you in order to minimize the chance of a transfusion reaction. If you or a relative donates blood, this is often done in anticipation of surgery and is not appropriate for emergency situations. It takes many days to process the donated blood. RISKS AND COMPLICATIONS Although transfusion therapy is very safe and saves many lives, the main dangers of transfusion include:   Getting an infectious disease.  Developing a transfusion reaction. This is an allergic reaction to something in the blood you were given. Every precaution is taken to prevent this. The decision to have a blood transfusion has been considered carefully by your caregiver before blood is given. Blood is not given unless the benefits outweigh the risks. AFTER THE TRANSFUSION  Right after receiving a blood transfusion, you will usually feel much better and more energetic. This is especially true if your red blood cells have gotten low (anemic). The transfusion raises the level of the red blood cells which carry oxygen, and this usually causes an energy increase.  The nurse administering the transfusion will monitor  you carefully for complications. HOME CARE INSTRUCTIONS  No special instructions are needed after a transfusion. You may find your energy is better. Speak with your caregiver about any limitations on activity for underlying diseases you may have. SEEK MEDICAL CARE IF:   Your condition is  not improving after your transfusion.  You develop redness or irritation at the intravenous (IV) site. SEEK IMMEDIATE MEDICAL CARE IF:  Any of the following symptoms occur over the next 12 hours:  Shaking chills.  You have a temperature by mouth above 102 F (38.9 C), not controlled by medicine.  Chest, back, or muscle pain.  People around you feel you are not acting correctly or are confused.  Shortness of breath or difficulty breathing.  Dizziness and fainting.  You get a rash or develop hives.  You have a decrease in urine output.  Your urine turns a dark color or changes to pink, red, or brown. Any of the following symptoms occur over the next 10 days:  You have a temperature by mouth above 102 F (38.9 C), not controlled by medicine.  Shortness of breath.  Weakness after normal activity.  The white part of the eye turns yellow (jaundice).  You have a decrease in the amount of urine or are urinating less often.  Your urine turns a dark color or changes to pink, red, or brown. Document Released: 12/30/1999 Document Revised: 03/26/2011 Document Reviewed: 08/18/2007 [facility] Patient Information 2015 [place], [place]. This information is not intended to replace advice given to you by your health care provider. Make sure you discuss any questions you have with your health care provider.

## 2014-01-05 NOTE — Patient Instructions (Signed)

## 2014-01-05 NOTE — Patient Instructions (Signed)
You will receive 2 units of blood to address your symptomatic chemotherapy induced anemia Follow up in 3 weeks with a restaging CT scan of your chest, abdomen and pelvis to re-evaluate your disease, prior to your next scheduled cycle of maintenance chemotherapy

## 2014-01-05 NOTE — Telephone Encounter (Signed)
gv and printed appt sched and avs for pt for Jan ...s.ed added tx.

## 2014-01-06 ENCOUNTER — Ambulatory Visit (HOSPITAL_BASED_OUTPATIENT_CLINIC_OR_DEPARTMENT_OTHER): Payer: Medicare Other

## 2014-01-06 ENCOUNTER — Other Ambulatory Visit: Payer: Self-pay | Admitting: Medical Oncology

## 2014-01-06 VITALS — BP 120/56 | HR 46 | Temp 97.1°F | Resp 18

## 2014-01-06 DIAGNOSIS — T451X5A Adverse effect of antineoplastic and immunosuppressive drugs, initial encounter: Principal | ICD-10-CM

## 2014-01-06 DIAGNOSIS — C3431 Malignant neoplasm of lower lobe, right bronchus or lung: Secondary | ICD-10-CM

## 2014-01-06 DIAGNOSIS — D6481 Anemia due to antineoplastic chemotherapy: Secondary | ICD-10-CM

## 2014-01-06 LAB — PREPARE RBC (CROSSMATCH)

## 2014-01-06 MED ORDER — SODIUM CHLORIDE 0.9 % IV SOLN
250.0000 mL | Freq: Once | INTRAVENOUS | Status: AC
  Administered 2014-01-06: 250 mL via INTRAVENOUS

## 2014-01-06 MED ORDER — DIPHENHYDRAMINE HCL 25 MG PO CAPS
ORAL_CAPSULE | ORAL | Status: AC
  Filled 2014-01-06: qty 1

## 2014-01-06 MED ORDER — ACETAMINOPHEN 325 MG PO TABS
650.0000 mg | ORAL_TABLET | Freq: Once | ORAL | Status: AC
  Administered 2014-01-06: 650 mg via ORAL

## 2014-01-06 MED ORDER — HEPARIN SOD (PORK) LOCK FLUSH 100 UNIT/ML IV SOLN
500.0000 [IU] | Freq: Every day | INTRAVENOUS | Status: AC | PRN
  Administered 2014-01-06: 500 [IU]
  Filled 2014-01-06: qty 5

## 2014-01-06 MED ORDER — ACETAMINOPHEN 325 MG PO TABS
ORAL_TABLET | ORAL | Status: AC
  Filled 2014-01-06: qty 2

## 2014-01-06 MED ORDER — DIPHENHYDRAMINE HCL 25 MG PO CAPS
25.0000 mg | ORAL_CAPSULE | Freq: Once | ORAL | Status: AC
  Administered 2014-01-06: 25 mg via ORAL

## 2014-01-06 MED ORDER — SODIUM CHLORIDE 0.9 % IJ SOLN
10.0000 mL | INTRAMUSCULAR | Status: AC | PRN
  Administered 2014-01-06: 10 mL
  Filled 2014-01-06: qty 10

## 2014-01-06 NOTE — Patient Instructions (Signed)

## 2014-01-07 LAB — TYPE AND SCREEN
ABO/RH(D): O POS
ANTIBODY SCREEN: NEGATIVE
Unit division: 0
Unit division: 0

## 2014-01-15 ENCOUNTER — Inpatient Hospital Stay (HOSPITAL_COMMUNITY)
Admission: EM | Admit: 2014-01-15 | Discharge: 2014-01-18 | DRG: 812 | Disposition: A | Discharge status: Home / Self Care | Payer: Medicare Other | Attending: Internal Medicine | Admitting: Internal Medicine

## 2014-01-15 ENCOUNTER — Encounter (HOSPITAL_COMMUNITY): Payer: Self-pay | Admitting: Emergency Medicine

## 2014-01-15 DIAGNOSIS — D709 Neutropenia, unspecified: Secondary | ICD-10-CM

## 2014-01-15 DIAGNOSIS — J410 Simple chronic bronchitis: Secondary | ICD-10-CM

## 2014-01-15 DIAGNOSIS — Z923 Personal history of irradiation: Secondary | ICD-10-CM

## 2014-01-15 DIAGNOSIS — Z87891 Personal history of nicotine dependence: Secondary | ICD-10-CM

## 2014-01-15 DIAGNOSIS — D630 Anemia in neoplastic disease: Secondary | ICD-10-CM | POA: Diagnosis not present

## 2014-01-15 DIAGNOSIS — D61810 Antineoplastic chemotherapy induced pancytopenia: Secondary | ICD-10-CM

## 2014-01-15 DIAGNOSIS — D6481 Anemia due to antineoplastic chemotherapy: Principal | ICD-10-CM | POA: Diagnosis present

## 2014-01-15 DIAGNOSIS — D62 Acute posthemorrhagic anemia: Secondary | ICD-10-CM | POA: Diagnosis not present

## 2014-01-15 DIAGNOSIS — Z7952 Long term (current) use of systemic steroids: Secondary | ICD-10-CM | POA: Diagnosis not present

## 2014-01-15 DIAGNOSIS — D696 Thrombocytopenia, unspecified: Secondary | ICD-10-CM | POA: Diagnosis not present

## 2014-01-15 DIAGNOSIS — C7931 Secondary malignant neoplasm of brain: Secondary | ICD-10-CM | POA: Diagnosis not present

## 2014-01-15 DIAGNOSIS — J449 Chronic obstructive pulmonary disease, unspecified: Secondary | ICD-10-CM | POA: Diagnosis present

## 2014-01-15 DIAGNOSIS — D649 Anemia, unspecified: Secondary | ICD-10-CM | POA: Diagnosis present

## 2014-01-15 DIAGNOSIS — D638 Anemia in other chronic diseases classified elsewhere: Secondary | ICD-10-CM | POA: Diagnosis not present

## 2014-01-15 DIAGNOSIS — E876 Hypokalemia: Secondary | ICD-10-CM | POA: Diagnosis not present

## 2014-01-15 DIAGNOSIS — Z79899 Other long term (current) drug therapy: Secondary | ICD-10-CM

## 2014-01-15 DIAGNOSIS — C3431 Malignant neoplasm of lower lobe, right bronchus or lung: Secondary | ICD-10-CM | POA: Diagnosis not present

## 2014-01-15 DIAGNOSIS — T451X5A Adverse effect of antineoplastic and immunosuppressive drugs, initial encounter: Secondary | ICD-10-CM | POA: Diagnosis not present

## 2014-01-15 LAB — COMPREHENSIVE METABOLIC PANEL
ALT: 34 U/L (ref 0–53)
AST: 41 U/L — ABNORMAL HIGH (ref 0–37)
Albumin: 2.7 g/dL — ABNORMAL LOW (ref 3.5–5.2)
Alkaline Phosphatase: 268 U/L — ABNORMAL HIGH (ref 39–117)
Anion gap: 9 (ref 5–15)
BUN: 20 mg/dL (ref 6–23)
CALCIUM: 8.2 mg/dL — AB (ref 8.4–10.5)
CO2: 27 mmol/L (ref 19–32)
CREATININE: 1.15 mg/dL (ref 0.50–1.35)
Chloride: 99 mEq/L (ref 96–112)
GFR calc non Af Amer: 59 mL/min — ABNORMAL LOW (ref >90)
GFR, EST AFRICAN AMERICAN: 68 mL/min — AB (ref >90)
GLUCOSE: 109 mg/dL — AB (ref 70–99)
Potassium: 3.2 mmol/L — ABNORMAL LOW (ref 3.5–5.1)
Sodium: 135 mmol/L (ref 135–145)
Total Bilirubin: 0.9 mg/dL (ref 0.3–1.2)
Total Protein: 5.7 g/dL — ABNORMAL LOW (ref 6.0–8.3)

## 2014-01-15 LAB — CBC WITH DIFFERENTIAL/PLATELET
Basophils Absolute: 0 10*3/uL (ref 0.0–0.1)
Basophils Relative: 0 % (ref 0–1)
EOS ABS: 0 10*3/uL (ref 0.0–0.7)
EOS PCT: 2 % (ref 0–5)
HEMATOCRIT: 25.3 % — AB (ref 39.0–52.0)
HEMOGLOBIN: 8.8 g/dL — AB (ref 13.0–17.0)
LYMPHS PCT: 24 % (ref 12–46)
Lymphs Abs: 0.3 10*3/uL — ABNORMAL LOW (ref 0.7–4.0)
MCH: 32.4 pg (ref 26.0–34.0)
MCHC: 34.8 g/dL (ref 30.0–36.0)
MCV: 93 fL (ref 78.0–100.0)
MONOS PCT: 26 % — AB (ref 3–12)
Monocytes Absolute: 0.3 10*3/uL (ref 0.1–1.0)
NEUTROS ABS: 0.7 10*3/uL — AB (ref 1.7–7.7)
NEUTROS PCT: 48 % (ref 43–77)
Platelets: 37 10*3/uL — ABNORMAL LOW (ref 150–400)
RBC: 2.72 MIL/uL — AB (ref 4.22–5.81)
RDW: 13.7 % (ref 11.5–15.5)
WBC: 1.3 10*3/uL — AB (ref 4.0–10.5)

## 2014-01-15 LAB — RETICULOCYTES
RBC.: 2.71 MIL/uL — ABNORMAL LOW (ref 4.22–5.81)
Retic Ct Pct: <0.4 % — ABNORMAL LOW (ref 0.4–3.1)

## 2014-01-15 LAB — PREPARE RBC (CROSSMATCH)

## 2014-01-15 MED ORDER — NYSTATIN 100000 UNIT/ML MT SUSP
5.0000 mL | Freq: Four times a day (QID) | OROMUCOSAL | Status: DC
  Administered 2014-01-15 – 2014-01-18 (×10): 500000 [IU] via ORAL
  Filled 2014-01-15 (×13): qty 5

## 2014-01-15 MED ORDER — TBO-FILGRASTIM 480 MCG/0.8ML ~~LOC~~ SOSY
480.0000 ug | PREFILLED_SYRINGE | Freq: Once | SUBCUTANEOUS | Status: AC
  Administered 2014-01-15: 480 ug via SUBCUTANEOUS
  Filled 2014-01-15: qty 0.8

## 2014-01-15 MED ORDER — SODIUM CHLORIDE 0.9 % IV SOLN: 10.0000 mL/h | Freq: Once | INTRAVENOUS | Status: DC

## 2014-01-15 MED ORDER — FOLIC ACID 1 MG PO TABS
500.0000 ug | ORAL_TABLET | Freq: Every day | ORAL | Status: DC
  Administered 2014-01-16 – 2014-01-18 (×3): 0.5 mg via ORAL
  Filled 2014-01-15 (×3): qty 0.5

## 2014-01-15 MED ORDER — SODIUM CHLORIDE 0.9 % IV SOLN
Freq: Once | INTRAVENOUS | Status: AC
  Administered 2014-01-15: 17:00:00 via INTRAVENOUS

## 2014-01-15 MED ORDER — FILGRASTIM 480 MCG/1.6ML IJ SOLN: 480.0000 ug | Freq: Once | INTRAMUSCULAR | Status: DC

## 2014-01-15 MED ORDER — DOCUSATE SODIUM 100 MG PO CAPS
100.0000 mg | ORAL_CAPSULE | Freq: Two times a day (BID) | ORAL | Status: DC
  Administered 2014-01-15 – 2014-01-18 (×4): 100 mg via ORAL
  Filled 2014-01-15 (×7): qty 1

## 2014-01-15 MED ORDER — SODIUM CHLORIDE 0.9 % IV SOLN: Freq: Once | INTRAVENOUS | Status: DC

## 2014-01-15 MED ORDER — HYDROCODONE-ACETAMINOPHEN 5-325 MG PO TABS: 1.0000 | ORAL_TABLET | ORAL | Status: DC | PRN

## 2014-01-15 MED ORDER — ACETAMINOPHEN 325 MG PO TABS
650.0000 mg | ORAL_TABLET | Freq: Once | ORAL | Status: AC
  Administered 2014-01-15: 650 mg via ORAL
  Filled 2014-01-15: qty 2

## 2014-01-15 MED ORDER — ONDANSETRON HCL 4 MG/2ML IJ SOLN: 4.0000 mg | Freq: Four times a day (QID) | INTRAMUSCULAR | Status: DC | PRN

## 2014-01-15 MED ORDER — SIMVASTATIN 10 MG PO TABS
10.0000 mg | ORAL_TABLET | Freq: Every day | ORAL | Status: DC
  Administered 2014-01-15 – 2014-01-17 (×3): 10 mg via ORAL
  Filled 2014-01-15 (×4): qty 1

## 2014-01-15 MED ORDER — SODIUM CHLORIDE 0.9 % IJ SOLN
3.0000 mL | Freq: Two times a day (BID) | INTRAMUSCULAR | Status: DC
  Administered 2014-01-16 – 2014-01-17 (×3): 3 mL via INTRAVENOUS

## 2014-01-15 MED ORDER — FERROUS SULFATE 325 (65 FE) MG PO TABS
325.0000 mg | ORAL_TABLET | Freq: Every day | ORAL | Status: DC
  Administered 2014-01-16 – 2014-01-18 (×3): 325 mg via ORAL
  Filled 2014-01-15 (×5): qty 1

## 2014-01-15 MED ORDER — ACETAMINOPHEN 325 MG PO TABS
650.0000 mg | ORAL_TABLET | Freq: Four times a day (QID) | ORAL | Status: DC | PRN
Start: 2014-01-15 — End: 2014-01-18
  Administered 2014-01-17: 650 mg via ORAL
  Filled 2014-01-15: qty 2

## 2014-01-15 MED ORDER — DIPHENHYDRAMINE HCL 25 MG PO CAPS
25.0000 mg | ORAL_CAPSULE | Freq: Once | ORAL | Status: AC
  Administered 2014-01-15: 25 mg via ORAL
  Filled 2014-01-15: qty 1

## 2014-01-15 MED ORDER — SODIUM CHLORIDE 0.9 % IV SOLN
Freq: Once | INTRAVENOUS | Status: AC
  Administered 2014-01-15: via INTRAVENOUS

## 2014-01-15 MED ORDER — ONDANSETRON HCL 4 MG PO TABS: 4.0000 mg | ORAL_TABLET | Freq: Four times a day (QID) | ORAL | Status: DC | PRN

## 2014-01-15 MED ORDER — ACETAMINOPHEN 650 MG RE SUPP: 650.0000 mg | Freq: Four times a day (QID) | RECTAL | Status: DC | PRN

## 2014-01-15 NOTE — H&P (Signed)
PCP: [name], MD [facility]  Chief Complaint:  fatigued  HPI: [name] is a 81 y.o. male   has a past medical history of COPD (chronic obstructive pulmonary disease); Allergy; radiation therapy (12/09/12-01/01/13); and nscl ca w/ brain mets (dx'd 08/2012).   Presented with  Patient is actively on chemotherapy for metastatic non-small cell lung CA stage IV with metastatic spread to the brain last treatment was 01/05/14. At that time he was transfused 1 unit PRBC on 22 nd and 1 unit on 23 rd. He was not treated with Neupogen at the time. Patient continued to feel fatigued with shortness of breath, no chest pain, no fever. Patient reports spontaneous nose bleed few days ago. Today plt count 35.  He presented to ER because he was not feeling well. Hg was 8.8 after discussion with Oncology they recommend transfusion and Neupogen administration. In ER noted to have dark stool but hemoccult negative he is on iron.   Hospitalist was called for admission for symptomatic anemia  Review of Systems:    Pertinent positives include:  fatigue,  Constitutional:  No weight loss, night sweats, Fevers, chills,  weight loss  HEENT:  No headaches, Difficulty swallowing,Tooth/dental problems,Sore throat,  No sneezing, itching, ear ache, nasal congestion, post nasal drip,  Cardio-vascular:  No chest pain, Orthopnea, PND, anasarca, dizziness, palpitations.no Bilateral lower extremity swelling  GI:  No heartburn, indigestion, abdominal pain, nausea, vomiting, diarrhea, change in bowel habits, loss of appetite, melena, blood in stool, hematemesis Resp:  no shortness of breath at rest. No dyspnea on exertion, No excess mucus, no productive cough, No non-productive cough, No coughing up of blood.No change in color of mucus.No wheezing. Skin:  no rash or lesions. No jaundice GU:  no dysuria, change in color of urine, no urgency or frequency. No straining to  urinate.  No flank pain.  Musculoskeletal:  No joint pain or no joint swelling. No decreased range of motion. No back pain.  Psych:  No change in mood or affect. No depression or anxiety. No memory loss.  Neuro: no localizing neurological complaints, no tingling, no weakness, no double vision, no gait abnormality, no slurred speech, no confusion  Otherwise ROS are negative except for above, 10 systems were reviewed  Past Medical History: Past Medical History  Diagnosis Date  . COPD (chronic obstructive pulmonary disease)   . Allergy     allergic rhinitis  . Hx of radiation therapy 12/09/12-01/01/13    lung 37.5Gy  . nscl ca w/ brain mets dx'd 08/2012    Patient has a lung mass which is being evaluated   Past Surgical History  Procedure Laterality Date  . Cholecystectomy       Medications: Prior to Admission medications   Medication Sig Start Date End Date Taking? Authorizing Provider  acetaminophen (TYLENOL) 500 MG tablet Take 500 mg by mouth every 6 (six) hours as needed for mild pain.    Yes Historical Provider, MD  clobetasol cream (TEMOVATE) 7.62 % Apply 1 application topically daily as needed (for itching).   Yes Historical Provider, MD  dexamethasone (DECADRON) 4 MG tablet 4 mg by mouth twice a day the day before, day of and day after the chemotherapy every 3 weeks 09/15/13  Yes Curt Bears, MD  ferrous sulfate (IRON SUPPLEMENT) 325 (65 FE) MG tablet Take 325 mg by mouth daily with breakfast.   Yes Historical Provider, MD  folic acid (FOLVITE) 831 MCG tablet Take 400 mcg  by mouth every morning.   Yes Historical Provider, MD  furosemide (LASIX) 20 MG tablet Take 20 mg by mouth daily as needed for fluid.   Yes Historical Provider, MD  nystatin (MYCOSTATIN) 100000 UNIT/ML suspension Take 5 mLs by mouth 4 (four) times daily.   Yes Historical Provider, MD  PEMEtrexed Disodium (ALIMTA IV) Inject into the vein every 21 ( twenty-one) days.   Yes Historical Provider, MD  Polyethyl  Glycol-Propyl Glycol (SYSTANE OP) Place 1 drop into both eyes daily.   Yes Historical Provider, MD  simvastatin (ZOCOR) 10 MG tablet Take 10 mg by mouth at bedtime.   Yes Historical Provider, MD  furosemide (LASIX) 20 MG tablet 1 TABLET BY MOUTH DAILY AS NEEDED FOR SWELLING OF THE LOWER EXTREMITIES. Patient not taking: Reported on 01/15/2014 12/20/13   Curt Bears, MD  prochlorperazine (COMPAZINE) 10 MG tablet Take 1 tablet (10 mg total) by mouth every 6 (six) hours as needed for nausea or vomiting. Patient not taking: Reported on 01/05/2014 12/19/12   Curt Bears, MD    Allergies:   Allergies  Allergen Reactions  . Sulfa Antibiotics Nausea And Vomiting    Social History:  Ambulatory   Independently  Lives at home   With family     reports that he quit smoking about 15 years ago. He has never used smokeless tobacco. He reports that he does not drink alcohol or use illicit drugs.    Family History: family history includes Cancer in his mother; Stroke in his father.    Physical Exam: Patient Vitals for the past 24 hrs:  BP Temp Temp src Pulse Resp SpO2  01/15/14 1907 128/79 mmHg - - 94 20 97 %  01/15/14 1558 133/70 mmHg 97.9 F (36.6 C) Oral 103 18 100 %    1. General:  in No Acute distress 2. Psychological: Alert and  Oriented 3. Head/ENT:   Moist Mucous Membranes                          Head Non traumatic, neck supple                          Normal   Dentition 4. SKIN:  decreased Skin turgor,  Skin clean Dry and intact no rash 5. Heart: Regular rate and rhythm no Murmur, Rub or gallop 6. Lungs: Clear to auscultation bilaterally, no wheezes or crackles   7. Abdomen: Soft, non-tender, Non distended 8. Lower extremities: no clubbing, cyanosis, or edema 9. Neurologically Grossly intact, moving all 4 extremities equally 10. MSK: Normal range of motion Hemoccult negative  body mass index is unknown because there is no weight on file.   Labs on Admission:    Results for orders placed or performed during the hospital encounter of 01/15/14 (from the past 24 hour(s))  CBC with Differential     Status: Abnormal   Collection Time: 01/15/14  5:29 PM  Result Value Ref Range   WBC 1.3 (LL) 4.0 - 10.5 K/uL   RBC 2.72 (L) 4.22 - 5.81 MIL/uL   Hemoglobin 8.8 (L) 13.0 - 17.0 g/dL   HCT 25.3 (L) 39.0 - 52.0 %   MCV 93.0 78.0 - 100.0 fL   MCH 32.4 26.0 - 34.0 pg   MCHC 34.8 30.0 - 36.0 g/dL   RDW 13.7 11.5 - 15.5 %   Platelets 37 (L) 150 - 400 K/uL   Neutrophils Relative % 48  43 - 77 %   Lymphocytes Relative 24 12 - 46 %   Monocytes Relative 26 (H) 3 - 12 %   Eosinophils Relative 2 0 - 5 %   Basophils Relative 0 0 - 1 %   Neutro Abs 0.7 (L) 1.7 - 7.7 K/uL   Lymphs Abs 0.3 (L) 0.7 - 4.0 K/uL   Monocytes Absolute 0.3 0.1 - 1.0 K/uL   Eosinophils Absolute 0.0 0.0 - 0.7 K/uL   Basophils Absolute 0.0 0.0 - 0.1 K/uL   Smear Review MORPHOLOGY UNREMARKABLE   Comprehensive metabolic panel     Status: Abnormal   Collection Time: 01/15/14  5:29 PM  Result Value Ref Range   Sodium 135 135 - 145 mmol/L   Potassium 3.2 (L) 3.5 - 5.1 mmol/L   Chloride 99 96 - 112 mEq/L   CO2 27 19 - 32 mmol/L   Glucose, Bld 109 (H) 70 - 99 mg/dL   BUN 20 6 - 23 mg/dL   Creatinine, Ser 1.15 0.50 - 1.35 mg/dL   Calcium 8.2 (L) 8.4 - 10.5 mg/dL   Total Protein 5.7 (L) 6.0 - 8.3 g/dL   Albumin 2.7 (L) 3.5 - 5.2 g/dL   AST 41 (H) 0 - 37 U/L   ALT 34 0 - 53 U/L   Alkaline Phosphatase 268 (H) 39 - 117 U/L   Total Bilirubin 0.9 0.3 - 1.2 mg/dL   GFR calc non Af Amer 59 (L) >90 mL/min   GFR calc Af Amer 68 (L) >90 mL/min   Anion gap 9 5 - 15    UA not obtained  No results found for: HGBA1C  Estimated Creatinine Clearance: 54.7 mL/min (by C-G formula based on Cr of 1.15).  BNP (last 3 results) No results for input(s): PROBNP in the last 8760 hours.  Other results:  I have pearsonaly reviewed this: ECG not obtained   There were no vitals filed for this  visit.   Cultures:    Component Value Date/Time   SDES ABSCESS ABDOMEN 01/31/2007 1216   SPECREQUEST cefoxitin IMMUNE:NORM 01/31/2007 1216   CULT RARE KLEBSIELLA OXYTOCA 01/31/2007 1215   CULT NO ANAEROBES ISOLATED 01/31/2007 1215   REPTSTATUS 01/31/2007 FINAL 01/31/2007 1216     Radiological Exams on Admission: No results found.  Chart has been reviewed  Assessment/Plan  81 year old male with history of non-small cell lung cancer stage IV metastatic spread to the brain currently on palliative chemotherapy resulting in pancytopenia now requiring transfusion due to symptomatic anemia   Present on Admission:  . Anemia - as per recommendation of oncology will transfuse 2 units prior to this obtain anemia panel to evaluate when having deficiency  . Primary cancer of right lower lobe of lung - as per oncology  . COPD (chronic obstructive pulmonary disease) - stable continue home medications  . Brain metastasis - currently stable no neurological complaints    Prophylaxis: SCD, Protonix  CODE STATUS:  FULL CODE  As per patient's request   Other plan as per orders.  I have spent a total of 55 min on this admission  Jerimah Witucki 01/15/2014, 9:43 PM  [facility]  Pager [phone]   after 2 AM please page floor coverage PA If 7AM-7PM, please contact the day team taking care of the patient  [email or web address]  Password [email or web address]

## 2014-01-15 NOTE — ED Provider Notes (Signed)
CSN: [id number]     Arrival date & time 01/15/14  1542 History   First MD Initiated Contact with Patient 01/15/14 [phone]     Chief Complaint  Patient presents with  . Chemo Card   . Melena  . Weakness     (Consider location/radiation/quality/duration/timing/severity/associated sxs/prior Treatment) Patient is a 81 y.o. male presenting with weakness. The history is provided by the patient. No language interpreter was used.  Weakness This is a new problem. The current episode started in the past 7 days. The problem occurs constantly. The problem has been gradually worsening. Associated symptoms include fatigue and weakness. Pertinent negatives include no fever or urinary symptoms. Nothing aggravates the symptoms. He has tried nothing for the symptoms. The treatment provided no relief.   Pt is an Oncology pt on chemo, followed by Dr. Earlie Server.   Pt reports he had his last chemo on 12/22.   His hemoglobin had dropped to 8 and he was given a transfusion.  Pt reports he normally feels bad for a few days after chemo but he has not returned to normal this time.  Pt noticed black stool and is concerned about blood.  Past Medical History  Diagnosis Date  . COPD (chronic obstructive pulmonary disease)   . Allergy     allergic rhinitis  . Hx of radiation therapy 12/09/12-01/01/13    lung 37.5Gy  . nscl ca w/ brain mets dx'd 08/2012    Patient has a lung mass which is being evaluated   Past Surgical History  Procedure Laterality Date  . Cholecystectomy     Family History  Problem Relation Age of Onset  . Cancer Mother     breast  . Stroke Father    History  Substance Use Topics  . Smoking status: Former Smoker    Quit date: 09/11/1998  . Smokeless tobacco: Never Used  . Alcohol Use: No    Review of Systems  Constitutional: Positive for fatigue. Negative for fever.  Neurological: Positive for weakness.  All other systems reviewed and are negative.     Allergies  Sulfa  antibiotics  Home Medications   Prior to Admission medications   Medication Sig Start Date End Date Taking? Authorizing Provider  acetaminophen (TYLENOL) 500 MG tablet Take 500 mg by mouth every 6 (six) hours as needed for mild pain.    Yes Historical Provider, MD  clobetasol cream (TEMOVATE) 7.78 % Apply 1 application topically daily as needed (for itching).   Yes Historical Provider, MD  dexamethasone (DECADRON) 4 MG tablet 4 mg by mouth twice a day the day before, day of and day after the chemotherapy every 3 weeks 09/15/13  Yes Curt Bears, MD  ferrous sulfate (IRON SUPPLEMENT) 325 (65 FE) MG tablet Take 325 mg by mouth daily with breakfast.   Yes Historical Provider, MD  folic acid (FOLVITE) 242 MCG tablet Take 400 mcg by mouth every morning.   Yes Historical Provider, MD  furosemide (LASIX) 20 MG tablet Take 20 mg by mouth daily as needed for fluid.   Yes Historical Provider, MD  nystatin (MYCOSTATIN) 100000 UNIT/ML suspension Take 5 mLs by mouth 4 (four) times daily.   Yes Historical Provider, MD  PEMEtrexed Disodium (ALIMTA IV) Inject into the vein every 21 ( twenty-one) days.   Yes Historical Provider, MD  Polyethyl Glycol-Propyl Glycol (SYSTANE OP) Place 1 drop into both eyes daily.   Yes Historical Provider, MD  simvastatin (ZOCOR) 10 MG tablet Take 10 mg by mouth at bedtime.  Yes Historical Provider, MD  furosemide (LASIX) 20 MG tablet 1 TABLET BY MOUTH DAILY AS NEEDED FOR SWELLING OF THE LOWER EXTREMITIES. Patient not taking: Reported on 01/15/2014 12/20/13   Curt Bears, MD  prochlorperazine (COMPAZINE) 10 MG tablet Take 1 tablet (10 mg total) by mouth every 6 (six) hours as needed for nausea or vomiting. Patient not taking: Reported on 01/05/2014 12/19/12   Curt Bears, MD   BP 133/70 mmHg  Pulse 103  Temp(Src) 97.9 F (36.6 C) (Oral)  Resp 18  SpO2 100% Physical Exam  Constitutional: He is oriented to person, place, and time. He appears well-developed and  well-nourished.  HENT:  Head: Normocephalic.  Right Ear: External ear normal.  Mouth/Throat: Oropharynx is clear and moist.  Eyes: EOM are normal.  Neck: Normal range of motion.  Cardiovascular: Normal heart sounds.   Pulmonary/Chest: Effort normal and breath sounds normal.  Abdominal: Soft. He exhibits no distension.  Genitourinary: Guaiac negative stool.  Musculoskeletal: Normal range of motion.  Neurological: He is alert and oriented to person, place, and time.  Skin: Skin is warm.  Psychiatric: He has a normal mood and affect.  Nursing note and vitals reviewed.   ED Course  Procedures (including critical care time) Labs Review Labs Reviewed  CBC WITH DIFFERENTIAL  COMPREHENSIVE METABOLIC PANEL   Results for orders placed or performed during the hospital encounter of 01/15/14  CBC with Differential  Result Value Ref Range   WBC 1.3 (LL) 4.0 - 10.5 K/uL   RBC 2.72 (L) 4.22 - 5.81 MIL/uL   Hemoglobin 8.8 (L) 13.0 - 17.0 g/dL   HCT 25.3 (L) 39.0 - 52.0 %   MCV 93.0 78.0 - 100.0 fL   MCH 32.4 26.0 - 34.0 pg   MCHC 34.8 30.0 - 36.0 g/dL   RDW 13.7 11.5 - 15.5 %   Platelets 37 (L) 150 - 400 K/uL   Neutrophils Relative % 48 43 - 77 %   Lymphocytes Relative 24 12 - 46 %   Monocytes Relative 26 (H) 3 - 12 %   Eosinophils Relative 2 0 - 5 %   Basophils Relative 0 0 - 1 %   Neutro Abs 0.7 (L) 1.7 - 7.7 K/uL   Lymphs Abs 0.3 (L) 0.7 - 4.0 K/uL   Monocytes Absolute 0.3 0.1 - 1.0 K/uL   Eosinophils Absolute 0.0 0.0 - 0.7 K/uL   Basophils Absolute 0.0 0.0 - 0.1 K/uL   Smear Review MORPHOLOGY UNREMARKABLE   Comprehensive metabolic panel  Result Value Ref Range   Sodium 135 135 - 145 mmol/L   Potassium 3.2 (L) 3.5 - 5.1 mmol/L   Chloride 99 96 - 112 mEq/L   CO2 27 19 - 32 mmol/L   Glucose, Bld 109 (H) 70 - 99 mg/dL   BUN 20 6 - 23 mg/dL   Creatinine, Ser 1.15 0.50 - 1.35 mg/dL   Calcium 8.2 (L) 8.4 - 10.5 mg/dL   Total Protein 5.7 (L) 6.0 - 8.3 g/dL   Albumin 2.7 (L) 3.5  - 5.2 g/dL   AST 41 (H) 0 - 37 U/L   ALT 34 0 - 53 U/L   Alkaline Phosphatase 268 (H) 39 - 117 U/L   Total Bilirubin 0.9 0.3 - 1.2 mg/dL   GFR calc non Af Amer 59 (L) >90 mL/min   GFR calc Af Amer 68 (L) >90 mL/min   Anion gap 9 5 - 15   No results found.   Imaging Review No results  found.   EKG Interpretation None      MDM  I spoke with Dr. Marin Olp on call for Dr. Earlie Server.   He advised to transfuse and given neupogen for neutropenia.  Hospitalist to admit.   Call to hospitalist.  I spoke to Dr. Marvis Repress who will admit.    Neupogen ordered per Dr. Antonieta Pert order.     Final diagnoses:  Neutropenia  Anemia in neoplastic disease    Pt and family counseled on treatment plan    Fransico Meadow, PA-C 01/15/14 2141  Charlesetta Shanks, MD 01/15/14 [phone]

## 2014-01-15 NOTE — ED Notes (Signed)
Bed: DG38 Expected date:  Expected time:  Means of arrival:  Comments: Katelyn, [place] (waiting room)

## 2014-01-15 NOTE — ED Notes (Signed)
Pt states that he last had chemo on 12/22.  Has lung cancer.  States that since then, he has had black tarry stools which he attributes to the chemo, however stools still appear black.  Pt states that he had a blood transfusion on 12/23.  States that he feels weak.  HR 130 upon ambulation to room.  Once pt settled in bed, dropped to low 100s.

## 2014-01-16 DIAGNOSIS — D638 Anemia in other chronic diseases classified elsewhere: Secondary | ICD-10-CM

## 2014-01-16 DIAGNOSIS — C7931 Secondary malignant neoplasm of brain: Secondary | ICD-10-CM

## 2014-01-16 DIAGNOSIS — D696 Thrombocytopenia, unspecified: Secondary | ICD-10-CM

## 2014-01-16 LAB — CBC
HCT: 29.2 % — ABNORMAL LOW (ref 39.0–52.0)
Hemoglobin: 9.9 g/dL — ABNORMAL LOW (ref 13.0–17.0)
MCH: 30.7 pg (ref 26.0–34.0)
MCHC: 33.9 g/dL (ref 30.0–36.0)
MCV: 90.7 fL (ref 78.0–100.0)
Platelets: 25 10*3/uL — CL (ref 150–400)
RBC: 3.22 MIL/uL — ABNORMAL LOW (ref 4.22–5.81)
RDW: 15.8 % — AB (ref 11.5–15.5)
WBC: 4.2 10*3/uL (ref 4.0–10.5)

## 2014-01-16 LAB — COMPREHENSIVE METABOLIC PANEL
ALBUMIN: 2.5 g/dL — AB (ref 3.5–5.2)
ALT: 30 U/L (ref 0–53)
ANION GAP: 9 (ref 5–15)
AST: 40 U/L — ABNORMAL HIGH (ref 0–37)
Alkaline Phosphatase: 247 U/L — ABNORMAL HIGH (ref 39–117)
BUN: 17 mg/dL (ref 6–23)
CO2: 26 mmol/L (ref 19–32)
Calcium: 8.1 mg/dL — ABNORMAL LOW (ref 8.4–10.5)
Chloride: 103 mEq/L (ref 96–112)
Creatinine, Ser: 1.17 mg/dL (ref 0.50–1.35)
GFR calc Af Amer: 67 mL/min — ABNORMAL LOW (ref >90)
GFR, EST NON AFRICAN AMERICAN: 58 mL/min — AB (ref >90)
Glucose, Bld: 99 mg/dL (ref 70–99)
POTASSIUM: 3.1 mmol/L — AB (ref 3.5–5.1)
Sodium: 138 mmol/L (ref 135–145)
TOTAL PROTEIN: 5.6 g/dL — AB (ref 6.0–8.3)
Total Bilirubin: 1.1 mg/dL (ref 0.3–1.2)

## 2014-01-16 LAB — PREPARE RBC (CROSSMATCH)

## 2014-01-16 LAB — MAGNESIUM: MAGNESIUM: 1.5 mg/dL (ref 1.5–2.5)

## 2014-01-16 LAB — TSH: TSH: 3.3 u[IU]/mL (ref 0.350–4.500)

## 2014-01-16 LAB — PHOSPHORUS: PHOSPHORUS: 2.7 mg/dL (ref 2.3–4.6)

## 2014-01-16 MED ORDER — POTASSIUM CHLORIDE CRYS ER 20 MEQ PO TBCR
40.0000 meq | EXTENDED_RELEASE_TABLET | Freq: Once | ORAL | Status: AC
  Administered 2014-01-16: 40 meq via ORAL
  Filled 2014-01-16: qty 2

## 2014-01-16 MED ORDER — POLYVINYL ALCOHOL 1.4 % OP SOLN
1.0000 [drp] | OPHTHALMIC | Status: DC | PRN
  Administered 2014-01-16: 1 [drp] via OPHTHALMIC
  Filled 2014-01-16: qty 15

## 2014-01-16 MED ORDER — HYPROMELLOSE (GONIOSCOPIC) 2.5 % OP SOLN: 1.0000 [drp] | OPHTHALMIC | Status: DC | PRN

## 2014-01-16 NOTE — Progress Notes (Signed)
CRITICAL VALUE ALERT  Critical value received:  01/16/2013  Date of notification:  01/16/2013  Time of notification:  0958  Critical value read back: yes  Nurse who received alert:  Jeannie Fend RN  MD notified (1st page):  Dr. Charlies Silvers  Time of first page:  33  MD notified (2nd page):  Time of second page:  Responding MD:  Dr. Charlies Silvers  Time MD responded:  1010

## 2014-01-16 NOTE — Progress Notes (Addendum)
Patient ID: [name], male   DOB: 10/09/1933, 81 y.o.   MRN: [id number]  [facility] PROGRESS NOTE  [name] QQV:[id number] DOB: August 16, 1933 DOA: 01/15/2014 PCP: [name], MD  Brief narrative:    81 year old male with past medical history of metastatic non small cell lung cancer (patient presented with left lower lobe lung mass in addition to mediastinal lymphadenopathy and brain metastases diagnosed in October of 2014), underwent stereotactic radiotherapy to 3 brain lesions under the care of Dr. Lisbeth Renshaw completed 12/08/2012, palliative radiotherapy to the left lower lobe lung mass under the care of Dr. Lisbeth Renshaw completed 01/01/2013, systemic chemotherapy with carboplatin, maintenance chemotherapy with single agent Alimta, status post 12 cycles (last given 01/05/2014).  Pt presented with weakness, fatigue and shortness of breath. He has had spontaneous nose bleed few days PTA. On admission, his platelet count was 37.    Assessment/Plan:    Active Problems: Acute blood loss anemia / anemia chronic disease  Likely due to malignancy and sequela of chemotherapy   Has received 2 Units PRBC since admission  Post transfusion hemoglobin is 9.9  Continue  monitor CBC daily  No signs of bleed this morning.  Thrombocytopenia  Likely sequela of chemotherapy  Platelets down to 25 this ma  Continue to monitor CBC daily  Leukopenia  Pt has received Neupogen x once since admission  WBC count is WNL  Non-small cell lung cancer with brain metastases  Diagnosed in October 2015.  Status post stereotactic radiotherapy to 3 brain lesions under the care of Dr. Lisbeth Renshaw completed 12/08/2012. Based on MRI brain 12/16/2013 no evidence of disease recurrence   Status post palliative radiotherapy to the left lower lobe lung mass under the care of Dr. Lisbeth Renshaw completed 01/01/2013  Receiving systemic chemotherapy currently with Alimta, status post 12 cycles (last given 01/05/2014)  Follows  with Dr. Julien Nordmann   Hypokalemia  Unclear etiology  Supplemented.  DVT Prophylaxis   SCD's bilaterally due to thrombocytopenia   Code Status: Full.  Family Communication:  plan of care discussed with the patient Disposition Plan: Home when stable.   IV access:  Peripheral IV PAC  Procedures and diagnostic studies:    No results found.  Medical Consultants:  None  Other Consultants:  None  IAnti-Infectives:   None    Leisa Lenz, MD  [facility] Pager [phone]  If 7PM-7AM, please contact night-coverage [email or web address] Password [email or web address] 01/16/2014, 12:50 PM   LOS: 1 day    HPI/Subjective: No acute overnight events.  Objective: Filed Vitals:   01/16/14 0035 01/16/14 0322 01/16/14 0351 01/16/14 0617  BP: 98/56 101/61 105/64 106/63  Pulse: 91 85 88 80  Temp: 98.5 F (36.9 C) 98 F (36.7 C) 98.1 F (36.7 C) 98.1 F (36.7 C)  TempSrc: Oral Oral Oral Oral  Resp: 18 18 18 18   Height:      Weight:      SpO2: 95% 97% 96% 96%    Intake/Output Summary (Last 24 hours) at 01/16/14 1250 Last data filed at 01/16/14 0932  Gross per 24 hour  Intake 1091.25 ml  Output    790 ml  Net 301.25 ml    Exam:   General:  Pt is alert, follows commands appropriately, not in acute distress  Cardiovascular: Regular rate and rhythm, S1/S2, no murmurs  Respiratory: Clear to auscultation bilaterally, no wheezing, no crackles, no rhonchi  Abdomen: Soft, non tender, non distended, bowel sounds present  Extremities: No edema, pulses DP and PT palpable  bilaterally  Neuro: Grossly nonfocal  Data Reviewed: Basic Metabolic Panel:  Recent Labs Lab 01/15/14 1729 01/16/14 0928  NA 135 138  K 3.2* 3.1*  CL 99 103  CO2 27 26  GLUCOSE 109* 99  BUN 20 17  CREATININE 1.15 1.17  CALCIUM 8.2* 8.1*  MG  --  1.5  PHOS  --  2.7   Liver Function Tests:  Recent Labs Lab 01/15/14 1729 01/16/14 0928  AST 41* 40*  ALT 34 30  ALKPHOS 268* 247*  BILITOT 0.9 1.1   PROT 5.7* 5.6*  ALBUMIN 2.7* 2.5*   No results for input(s): LIPASE, AMYLASE in the last 168 hours. No results for input(s): AMMONIA in the last 168 hours. CBC:  Recent Labs Lab 01/15/14 1729 01/16/14 0928  WBC 1.3* 4.2  NEUTROABS 0.7*  --   HGB 8.8* 9.9*  HCT 25.3* 29.2*  MCV 93.0 90.7  PLT 37* 25*   Cardiac Enzymes: No results for input(s): CKTOTAL, CKMB, CKMBINDEX, TROPONINI in the last 168 hours. BNP: Invalid input(s): POCBNP CBG: No results for input(s): GLUCAP in the last 168 hours.  No results found for this or any previous visit (from the past 240 hour(s)).   Scheduled Meds: . docusate sodium  100 mg Oral BID  . ferrous sulfate  325 mg Oral Q breakfast  . folic acid  829 mcg Oral Daily  . nystatin  5 mL Oral QID  . simvastatin  10 mg Oral QHS

## 2014-01-16 NOTE — Progress Notes (Addendum)
Pt been tachy around 110-120 all day and K is 3.1.   Informed Dr. Charlies Silvers and inquired about adding betablocker. See orders  Amalia Hailey Zenovia Jordan, RN

## 2014-01-17 DIAGNOSIS — D649 Anemia, unspecified: Secondary | ICD-10-CM

## 2014-01-17 DIAGNOSIS — J449 Chronic obstructive pulmonary disease, unspecified: Secondary | ICD-10-CM

## 2014-01-17 LAB — TYPE AND SCREEN
ABO/RH(D): O POS
Antibody Screen: NEGATIVE
Unit division: 0
Unit division: 0

## 2014-01-17 LAB — CBC
HCT: 30.5 % — ABNORMAL LOW (ref 39.0–52.0)
Hemoglobin: 10.5 g/dL — ABNORMAL LOW (ref 13.0–17.0)
MCH: 31.1 pg (ref 26.0–34.0)
MCHC: 34.4 g/dL (ref 30.0–36.0)
MCV: 90.2 fL (ref 78.0–100.0)
Platelets: 32 10*3/uL — ABNORMAL LOW (ref 150–400)
RBC: 3.38 MIL/uL — ABNORMAL LOW (ref 4.22–5.81)
RDW: 15.9 % — AB (ref 11.5–15.5)
WBC: 8.8 10*3/uL (ref 4.0–10.5)

## 2014-01-17 LAB — IRON AND TIBC
Iron: 79 ug/dL (ref 42–165)
Saturation Ratios: 40 % (ref 20–55)
TIBC: 199 ug/dL — ABNORMAL LOW (ref 215–435)
UIBC: 120 ug/dL — ABNORMAL LOW (ref 125–400)

## 2014-01-17 LAB — VITAMIN B12: VITAMIN B 12: 1043 pg/mL — AB (ref 211–911)

## 2014-01-17 LAB — FERRITIN: Ferritin: 2641 ng/mL — ABNORMAL HIGH (ref 22–322)

## 2014-01-17 LAB — FOLATE: Folate: >20 ng/mL

## 2014-01-17 NOTE — Progress Notes (Signed)
**Note Preston-Identified via Obfuscation** Patient ID: [name], male   DOB: 09-24-1933, 81 y.o.   MRN: [id number] [facility] PROGRESS NOTE  [name] JSE:[id number] DOB: 1933/01/29 DOA: 01/15/2014 PCP: [name], MD  Brief narrative:    81 year old male with past medical history of metastatic non small cell lung cancer (patient presented with left lower lobe lung mass in addition to mediastinal lymphadenopathy and brain metastases diagnosed in October of 2014), underwent stereotactic radiotherapy to 3 brain lesions under the care of Dr. Lisbeth Renshaw completed 12/08/2012, palliative radiotherapy to the left lower lobe lung mass under the care of Dr. Lisbeth Renshaw completed 01/01/2013, systemic chemotherapy with carboplatin, maintenance chemotherapy with single agent Alimta, status post 12 cycles (last given 01/05/2014).  Pt presented with weakness, fatigue and shortness of breath. He has had spontaneous nose bleed few days PTA. On admission, his platelet count was 37.    Assessment/Plan:    Active Problems: Acute blood loss anemia / anemia chronic disease  Likely due to malignancy and sequela of chemotherapy   Has received 2 Units PRBC since admission  Post transfusion hemoglobin is 9.9 and 10.5.   No bleeding.  Follow up CBC in am.  Thrombocytopenia  Likely sequela of chemotherapy  Platelets down to 25 but improving in last 24 hours. This am 32.  Follow up CBC in am.  Leukopenia  Pt has received Neupogen x once since admission  WBC count is WNL  Non-small cell lung cancer with brain metastases  Diagnosed in October 2015.  Status post stereotactic radiotherapy to 3 brain lesions under the care of Dr. Lisbeth Renshaw completed 12/08/2012. Based on MRI brain 12/16/2013 no evidence of disease recurrence   Status post palliative radiotherapy to the left lower lobe lung mass under the care of Dr. Lisbeth Renshaw completed 01/01/2013  Receiving systemic chemotherapy currently with Alimta, status post 12 cycles (last given  01/05/2014)  Follows with Dr. Julien Nordmann   Hypokalemia  Unclear etiology  Supplemented.  DVT Prophylaxis   SCD's bilaterally due to thrombocytopenia   Code Status: Full.  Family Communication:  plan of care discussed with the patient Disposition Plan: Home when stable.   IV access:  Peripheral IV PAC  Procedures and diagnostic studies:    No results found.  Medical Consultants:  None  Other Consultants:  None  IAnti-Infectives:   None    Leisa Lenz, MD  [facility] Pager [phone]  If 7PM-7AM, please contact night-coverage [email or web address] Password [email or web address] 01/17/2014, 11:14 AM   LOS: 2 days    HPI/Subjective: No acute overnight events.  Objective: Filed Vitals:   01/16/14 2007 01/17/14 0002 01/17/14 0429 01/17/14 0930  BP: 134/70 118/69 118/63 111/68  Pulse: 119 115 105 102  Temp: 99.6 F (37.6 C) 99.4 F (37.4 C) 98.3 F (36.8 C) 97.5 F (36.4 C)  TempSrc: Oral Oral Oral Oral  Resp: 18 18 18 16   Height:      Weight:      SpO2: 95% 95% 95% 97%    Intake/Output Summary (Last 24 hours) at 01/17/14 1114 Last data filed at 01/17/14 0924  Gross per 24 hour  Intake    480 ml  Output   1800 ml  Net  -1320 ml    Exam:   General:  Pt is alert, follows commands appropriately, not in acute distress  Cardiovascular: Regular rate and rhythm, S1/S2, no murmurs  Respiratory: Clear to auscultation bilaterally, no wheezing, no crackles, no rhonchi  Abdomen: Soft, non tender, non distended, bowel sounds present  Data Reviewed: Basic Metabolic Panel:  Recent Labs Lab 01/15/14 1729 01/16/14 0928  NA 135 138  K 3.2* 3.1*  CL 99 103  CO2 27 26  GLUCOSE 109* 99  BUN 20 17  CREATININE 1.15 1.17  CALCIUM 8.2* 8.1*  MG  --  1.5  PHOS  --  2.7   Liver Function Tests:  Recent Labs Lab 01/15/14 1729 01/16/14 0928  AST 41* 40*  ALT 34 30  ALKPHOS 268* 247*  BILITOT 0.9 1.1  PROT 5.7* 5.6*  ALBUMIN 2.7* 2.5*   No results for  input(s): LIPASE, AMYLASE in the last 168 hours. No results for input(s): AMMONIA in the last 168 hours. CBC:  Recent Labs Lab 01/15/14 1729 01/16/14 0928 01/17/14 0500  WBC 1.3* 4.2 8.8  NEUTROABS 0.7*  --   --   HGB 8.8* 9.9* 10.5*  HCT 25.3* 29.2* 30.5*  MCV 93.0 90.7 90.2  PLT 37* 25* 32*   Cardiac Enzymes: No results for input(s): CKTOTAL, CKMB, CKMBINDEX, TROPONINI in the last 168 hours. BNP: Invalid input(s): POCBNP CBG: No results for input(s): GLUCAP in the last 168 hours.  No results found for this or any previous visit (from the past 240 hour(s)).   Scheduled Meds: . sodium chloride  10 mL/hr Intravenous Once  . sodium chloride   Intravenous Once  . docusate sodium  100 mg Oral BID  . ferrous sulfate  325 mg Oral Q breakfast  . folic acid  297 mcg Oral Daily  . nystatin  5 mL Oral QID  . simvastatin  10 mg Oral QHS  . sodium chloride  3 mL Intravenous Q12H   Continuous Infusions:

## 2014-01-18 ENCOUNTER — Other Ambulatory Visit: Payer: Self-pay | Admitting: Family Medicine

## 2014-01-18 LAB — CBC
HCT: 32.3 % — ABNORMAL LOW (ref 39.0–52.0)
Hemoglobin: 11 g/dL — ABNORMAL LOW (ref 13.0–17.0)
MCH: 31.3 pg (ref 26.0–34.0)
MCHC: 34.1 g/dL (ref 30.0–36.0)
MCV: 92 fL (ref 78.0–100.0)
Platelets: 59 10*3/uL — ABNORMAL LOW (ref 150–400)
RBC: 3.51 MIL/uL — AB (ref 4.22–5.81)
RDW: 15.5 % (ref 11.5–15.5)
WBC: 7.7 10*3/uL (ref 4.0–10.5)

## 2014-01-18 LAB — OCCULT BLOOD, POC DEVICE: Fecal Occult Bld: NEGATIVE

## 2014-01-18 MED ORDER — HEPARIN SOD (PORK) LOCK FLUSH 100 UNIT/ML IV SOLN
500.0000 [IU] | INTRAVENOUS | Status: AC | PRN
  Administered 2014-01-18: 500 [IU]

## 2014-01-18 NOTE — Telephone Encounter (Signed)
Last lipids 08/2012, has lung CA, goes to Oncologist only now

## 2014-01-18 NOTE — Discharge Instructions (Signed)
Thrombocytopenia Thrombocytopenia is a condition in which there is an abnormally small number of platelets in your blood. Platelets are also called thrombocytes. Platelets are needed for blood clotting. CAUSES Thrombocytopenia is caused by:   Decreased production of platelets. This can be caused by:  Aplastic anemia in which your bone marrow quits making blood cells.  Cancer in the bone marrow.  Use of certain medicines, including chemotherapy.  Infection in the bone marrow.  Heavy alcohol consumption.  Increased destruction of platelets. This can be caused by:  Certain immune diseases.  Use of certain drugs.  Certain blood clotting disorders.  Certain inherited disorders.  Certain bleeding disorders.  Pregnancy.  Having an enlarged spleen (hypersplenism). In hypersplenism, the spleen gathers up platelets from circulation. This means the platelets are not available to help with blood clotting. The spleen can enlarge due to cirrhosis or other conditions. SYMPTOMS  The symptoms of thrombocytopenia are side effects of poor blood clotting. Some of these are:  Abnormal bleeding.  Nosebleeds.  Heavy menstrual periods.  Blood in the urine or stools.  Purpura. This is a purplish discoloration in the skin produced by small bleeding vessels near the surface of the skin.  Bruising.  A rash that may be petechial. This looks like pinpoint, purplish-red spots on the skin and mucous membranes. It is caused by bleeding from small blood vessels (capillaries). DIAGNOSIS  Your caregiver will make this diagnosis based on your exam and blood tests. Sometimes, a bone marrow study is done to look for the original cells (megakaryocytes) that make platelets. TREATMENT  Treatment depends on the cause of the condition.  Medicines may be given to help protect your platelets from being destroyed.  In some cases, a replacement (transfusion) of platelets may be required to stop or prevent  bleeding.  Sometimes, the spleen must be surgically removed. HOME CARE INSTRUCTIONS   Check the skin and linings inside your mouth for bruising or bleeding as directed by your caregiver.  Check your sputum, urine, and stool for blood as directed by your caregiver.  Do not return to any activities that could cause bumps or bruises until your caregiver says it is okay.  Take extra care not to cut yourself when shaving or when using scissors, needles, knives, and other tools.  Take extra care not to burn yourself when ironing or cooking.  Ask your caregiver if it is okay for you to drink alcohol.  Only take over-the-counter or prescription medicines as directed by your caregiver.  Notify all your caregivers, including dentists and eye doctors, about your condition. SEEK IMMEDIATE MEDICAL CARE IF:   You develop active bleeding from anywhere in your body.  You develop unexplained bruising or bleeding.  You have blood in your sputum, urine, or stool. MAKE SURE YOU:  Understand these instructions.  Will watch your condition.  Will get help right away if you are not doing well or get worse. Document Released: 01/01/2005 Document Revised: 03/26/2011 Document Reviewed: 11/03/2010 [facility] Patient Information 2015 [place], [place]. This information is not intended to replace advice given to you by your health care provider. Make sure you discuss any questions you have with your health care provider.

## 2014-01-18 NOTE — Discharge Summary (Signed)
Physician Discharge Summary  [name] VEH:[id number] DOB: 20-Feb-1933 DOA: 01/15/2014  PCP: [name], MD  Admit date: 01/15/2014 Discharge date: 01/18/2014  Recommendations for Outpatient Follow-up:  1. Please follow-up with oncology per scheduled appointment. Platelet count is 59 prior to discharge.  Discharge Diagnoses:  Active Problems:   COPD (chronic obstructive pulmonary disease)   Primary cancer of right lower lobe of lung   Brain metastasis   Anemia   Symptomatic anemia    Discharge Condition: stable   Diet recommendation: as tolerated   History of present illness:  81 year old male with past medical history of metastatic non small cell lung cancer (patient presented with left lower lobe lung mass in addition to mediastinal lymphadenopathy and brain metastases diagnosed in October of 2014), underwent stereotactic radiotherapy to 3 brain lesions under the care of Dr. Lisbeth Renshaw completed 12/08/2012, palliative radiotherapy to the left lower lobe lung mass under the care of Dr. Lisbeth Renshaw completed 01/01/2013, systemic chemotherapy with carboplatin, maintenance chemotherapy with single agent Alimta, status post 12 cycles (last given 01/05/2014). Pt presented with weakness, fatigue and shortness of breath. He has had spontaneous nose bleed few days PTA. On admission, his platelet count was 37.    Assessment/Plan:    Active Problems: Acute blood loss anemia / anemia chronic disease  Likely due to malignancy and sequela of chemotherapy   Has received 2 Units PRBC since admission  Post transfusion hemoglobin is 9.9 and 10.5 and 11.0  No bleeding.  Thrombocytopenia  Likely sequela of chemotherapy  Platelets down to 25 but improving   Platelet count is 59 prior to discharge. No evidence of bleeding.  Leukopenia  Pt has received Neupogen x once since admission  WBC count is WNL  Non-small cell lung cancer with brain metastases  Diagnosed in October  2015.  Status post stereotactic radiotherapy to 3 brain lesions under the care of Dr. Lisbeth Renshaw completed 12/08/2012. Based on MRI brain 12/16/2013 no evidence of disease recurrence   Status post palliative radiotherapy to the left lower lobe lung mass under the care of Dr. Lisbeth Renshaw completed 01/01/2013  Receiving systemic chemotherapy currently with Alimta, status post 12 cycles (last given 01/05/2014)  Follows with Dr. Julien Nordmann  Hypokalemia  Unclear etiology  Supplemented.  DVT Prophylaxis   SCD's bilaterally due to thrombocytopenia  Code Status: Full.  Family Communication: plan of care discussed with the patient   IV access:  Peripheral IV PAC  Procedures and diagnostic studies:   No results found.  Medical Consultants:  None  Other Consultants:  None  IAnti-Infectives:   None    Signed:  Leisa Lenz, MD  [facility] 01/18/2014, 11:06 AM  Pager #: [phone]   Discharge Exam: Filed Vitals:   01/18/14 0609  BP: 112/71  Pulse: 103  Temp: 98.4 F (36.9 C)  Resp: 18   Filed Vitals:   01/17/14 0930 01/17/14 1431 01/17/14 2154 01/18/14 0609  BP: 111/68 112/65 131/70 112/71  Pulse: 102 87  103  Temp: 97.5 F (36.4 C) 97.8 F (36.6 C) 98.5 F (36.9 C) 98.4 F (36.9 C)  TempSrc: Oral Oral Oral Oral  Resp: 16 16 16 18   Height:      Weight:      SpO2: 97% 96% 100% 96%    General: Pt is alert, follows commands appropriately, not in acute distress Cardiovascular: Regular rate and rhythm, S1/S2 (+) Respiratory: Clear to auscultation bilaterally, no wheezing, no crackles, no rhonchi Abdominal: Soft, non tender, non distended, bowel  sounds +, no guarding Extremities: no edema, no cyanosis, pulses palpable bilaterally DP and PT Neuro: Grossly nonfocal  Discharge Instructions  Discharge Instructions    Call MD for:  difficulty breathing, headache or visual disturbances    Complete by:  As directed      Call MD for:   persistant dizziness or light-headedness    Complete by:  As directed      Call MD for:  redness, tenderness, or signs of infection (pain, swelling, redness, odor or green/yellow discharge around incision site)    Complete by:  As directed      Call MD for:  severe uncontrolled pain    Complete by:  As directed      Diet - low sodium heart healthy    Complete by:  As directed      Discharge instructions    Complete by:  As directed   1. Please follow-up with oncology per scheduled appointment. Platelet count is 59 prior to discharge.     Increase activity slowly    Complete by:  As directed             Medication List    STOP taking these medications        furosemide 20 MG tablet  Commonly known as:  LASIX     prochlorperazine 10 MG tablet  Commonly known as:  COMPAZINE      TAKE these medications        acetaminophen 500 MG tablet  Commonly known as:  TYLENOL  Take 500 mg by mouth every 6 (six) hours as needed for mild pain.     ALIMTA IV  Inject into the vein every 21 ( twenty-one) days.     clobetasol cream 0.05 %  Commonly known as:  TEMOVATE  Apply 1 application topically daily as needed (for itching).     dexamethasone 4 MG tablet  Commonly known as:  DECADRON  4 mg by mouth twice a day the day before, day of and day after the chemotherapy every 3 weeks     folic acid 981 MCG tablet  Commonly known as:  FOLVITE  Take 400 mcg by mouth every morning.     IRON SUPPLEMENT 325 (65 FE) MG tablet  Generic drug:  ferrous sulfate  Take 325 mg by mouth daily with breakfast.     nystatin 100000 UNIT/ML suspension  Commonly known as:  MYCOSTATIN  Take 5 mLs by mouth 4 (four) times daily.     simvastatin 10 MG tablet  Commonly known as:  ZOCOR  Take 10 mg by mouth at bedtime.     SYSTANE OP  Place 1 drop into both eyes daily.           Follow-up Information    Follow up with [name], MD. Schedule an appointment as soon as possible for a visit in 1  week.   Specialty:  Family Medicine   Why:  Follow up appt after recent hospitalization   Contact information:   [place] [address] [phone]        The results of significant diagnostics from this hospitalization (including imaging, microbiology, ancillary and laboratory) are listed below for reference.    Significant Diagnostic Studies: No results found.  Microbiology: No results found for this or any previous visit (from the past 240 hour(s)).   Labs: Basic Metabolic Panel:  Recent Labs Lab 01/15/14 1729 01/16/14 0928  NA 135 138  K 3.2* 3.1*  CL 99 103  CO2 27 26  GLUCOSE 109* 99  BUN 20 17  CREATININE 1.15 1.17  CALCIUM 8.2* 8.1*  MG  --  1.5  PHOS  --  2.7   Liver Function Tests:  Recent Labs Lab 01/15/14 1729 01/16/14 0928  AST 41* 40*  ALT 34 30  ALKPHOS 268* 247*  BILITOT 0.9 1.1  PROT 5.7* 5.6*  ALBUMIN 2.7* 2.5*   No results for input(s): LIPASE, AMYLASE in the last 168 hours. No results for input(s): AMMONIA in the last 168 hours. CBC:  Recent Labs Lab 01/15/14 1729 01/16/14 0928 01/17/14 0500 01/18/14 0930  WBC 1.3* 4.2 8.8 7.7  NEUTROABS 0.7*  --   --   --   HGB 8.8* 9.9* 10.5* 11.0*  HCT 25.3* 29.2* 30.5* 32.3*  MCV 93.0 90.7 90.2 92.0  PLT 37* 25* 32* 59*   Cardiac Enzymes: No results for input(s): CKTOTAL, CKMB, CKMBINDEX, TROPONINI in the last 168 hours. BNP: BNP (last 3 results) No results for input(s): PROBNP in the last 8760 hours. CBG: No results for input(s): GLUCAP in the last 168 hours.  Time coordinating discharge: Over 30 minutes

## 2014-01-18 NOTE — Progress Notes (Signed)
Discharge instructions given to pt, verbalized understanding. Left the unit in stable condition. 

## 2014-01-22 ENCOUNTER — Ambulatory Visit (HOSPITAL_COMMUNITY)
Admission: RE | Admit: 2014-01-22 | Discharge: 2014-01-22 | Disposition: A | Discharge status: Home / Self Care | Payer: Medicare Other | Source: Ambulatory Visit | Attending: Physician Assistant | Admitting: Physician Assistant

## 2014-01-22 ENCOUNTER — Encounter (HOSPITAL_COMMUNITY): Payer: Self-pay

## 2014-01-22 DIAGNOSIS — I313 Pericardial effusion (noninflammatory): Secondary | ICD-10-CM | POA: Insufficient documentation

## 2014-01-22 DIAGNOSIS — N200 Calculus of kidney: Secondary | ICD-10-CM | POA: Diagnosis not present

## 2014-01-22 DIAGNOSIS — C3431 Malignant neoplasm of lower lobe, right bronchus or lung: Secondary | ICD-10-CM | POA: Insufficient documentation

## 2014-01-22 DIAGNOSIS — M47896 Other spondylosis, lumbar region: Secondary | ICD-10-CM | POA: Insufficient documentation

## 2014-01-22 DIAGNOSIS — C7931 Secondary malignant neoplasm of brain: Secondary | ICD-10-CM | POA: Insufficient documentation

## 2014-01-22 DIAGNOSIS — J90 Pleural effusion, not elsewhere classified: Secondary | ICD-10-CM | POA: Diagnosis not present

## 2014-01-22 DIAGNOSIS — M5136 Other intervertebral disc degeneration, lumbar region: Secondary | ICD-10-CM | POA: Insufficient documentation

## 2014-01-22 DIAGNOSIS — K5730 Diverticulosis of large intestine without perforation or abscess without bleeding: Secondary | ICD-10-CM | POA: Diagnosis not present

## 2014-01-22 MED ORDER — IOHEXOL 300 MG/ML  SOLN
100.0000 mL | Freq: Once | INTRAMUSCULAR | Status: AC | PRN
  Administered 2014-01-22: 100 mL via INTRAVENOUS

## 2014-01-26 ENCOUNTER — Telehealth: Payer: Self-pay | Admitting: Internal Medicine

## 2014-01-26 ENCOUNTER — Other Ambulatory Visit: Payer: Self-pay | Admitting: *Deleted

## 2014-01-26 ENCOUNTER — Encounter: Payer: Self-pay | Admitting: Internal Medicine

## 2014-01-26 ENCOUNTER — Ambulatory Visit (HOSPITAL_BASED_OUTPATIENT_CLINIC_OR_DEPARTMENT_OTHER): Payer: Medicare Other | Admitting: Internal Medicine

## 2014-01-26 ENCOUNTER — Other Ambulatory Visit (HOSPITAL_BASED_OUTPATIENT_CLINIC_OR_DEPARTMENT_OTHER): Payer: Medicare Other

## 2014-01-26 ENCOUNTER — Ambulatory Visit: Payer: Medicare Other

## 2014-01-26 ENCOUNTER — Other Ambulatory Visit: Payer: Self-pay | Admitting: Physician Assistant

## 2014-01-26 ENCOUNTER — Ambulatory Visit (HOSPITAL_BASED_OUTPATIENT_CLINIC_OR_DEPARTMENT_OTHER): Payer: Medicare Other

## 2014-01-26 VITALS — BP 133/69 | HR 78 | Temp 97.5°F | Resp 19 | Ht 71.0 in | Wt 159.5 lb

## 2014-01-26 DIAGNOSIS — Z95828 Presence of other vascular implants and grafts: Secondary | ICD-10-CM

## 2014-01-26 DIAGNOSIS — C7931 Secondary malignant neoplasm of brain: Secondary | ICD-10-CM

## 2014-01-26 DIAGNOSIS — R5383 Other fatigue: Secondary | ICD-10-CM

## 2014-01-26 DIAGNOSIS — D61810 Antineoplastic chemotherapy induced pancytopenia: Secondary | ICD-10-CM

## 2014-01-26 DIAGNOSIS — C3431 Malignant neoplasm of lower lobe, right bronchus or lung: Secondary | ICD-10-CM | POA: Diagnosis not present

## 2014-01-26 DIAGNOSIS — D6481 Anemia due to antineoplastic chemotherapy: Secondary | ICD-10-CM

## 2014-01-26 DIAGNOSIS — Z5111 Encounter for antineoplastic chemotherapy: Secondary | ICD-10-CM

## 2014-01-26 DIAGNOSIS — C3490 Malignant neoplasm of unspecified part of unspecified bronchus or lung: Secondary | ICD-10-CM

## 2014-01-26 DIAGNOSIS — T451X5A Adverse effect of antineoplastic and immunosuppressive drugs, initial encounter: Secondary | ICD-10-CM

## 2014-01-26 LAB — CBC WITH DIFFERENTIAL/PLATELET
BASO%: 0.1 % (ref 0.0–2.0)
BASOS ABS: 0 10*3/uL (ref 0.0–0.1)
EOS%: 0 % (ref 0.0–7.0)
Eosinophils Absolute: 0 10*3/uL (ref 0.0–0.5)
HEMATOCRIT: 30.2 % — AB (ref 38.4–49.9)
HGB: 10.3 g/dL — ABNORMAL LOW (ref 13.0–17.1)
LYMPH%: 5.4 % — ABNORMAL LOW (ref 14.0–49.0)
MCH: 30.8 pg (ref 27.2–33.4)
MCHC: 34.1 g/dL (ref 32.0–36.0)
MCV: 90.4 fL (ref 79.3–98.0)
MONO#: 0.5 10*3/uL (ref 0.1–0.9)
MONO%: 6.9 % (ref 0.0–14.0)
NEUT%: 87.6 % — ABNORMAL HIGH (ref 39.0–75.0)
NEUTROS ABS: 6.6 10*3/uL — AB (ref 1.5–6.5)
Platelets: 257 10*3/uL (ref 140–400)
RBC: 3.34 10*6/uL — AB (ref 4.20–5.82)
RDW: 14.9 % — ABNORMAL HIGH (ref 11.0–14.6)
WBC: 7.6 10*3/uL (ref 4.0–10.3)
lymph#: 0.4 10*3/uL — ABNORMAL LOW (ref 0.9–3.3)
nRBC: 0 % (ref 0–0)

## 2014-01-26 LAB — COMPREHENSIVE METABOLIC PANEL (CC13)
ALBUMIN: 2.6 g/dL — AB (ref 3.5–5.0)
ALK PHOS: 280 U/L — AB (ref 40–150)
ALT: 32 U/L (ref 0–55)
ANION GAP: 10 meq/L (ref 3–11)
AST: 37 U/L — AB (ref 5–34)
BUN: 14.2 mg/dL (ref 7.0–26.0)
CALCIUM: 8.2 mg/dL — AB (ref 8.4–10.4)
CO2: 27 mEq/L (ref 22–29)
Chloride: 102 mEq/L (ref 98–109)
Creatinine: 1 mg/dL (ref 0.7–1.3)
EGFR: 71 mL/min/{1.73_m2} — ABNORMAL LOW (ref >90)
GLUCOSE: 114 mg/dL (ref 70–140)
Potassium: 3.5 mEq/L (ref 3.5–5.1)
SODIUM: 139 meq/L (ref 136–145)
Total Bilirubin: 0.55 mg/dL (ref 0.20–1.20)
Total Protein: 6 g/dL — ABNORMAL LOW (ref 6.4–8.3)

## 2014-01-26 MED ORDER — SODIUM CHLORIDE 0.9 % IV SOLN
503.0000 mg/m2 | Freq: Once | INTRAVENOUS | Status: AC
  Administered 2014-01-26: 1000 mg via INTRAVENOUS
  Filled 2014-01-26: qty 40

## 2014-01-26 MED ORDER — DEXAMETHASONE SODIUM PHOSPHATE 10 MG/ML IJ SOLN
INTRAMUSCULAR | Status: AC
  Filled 2014-01-26: qty 1

## 2014-01-26 MED ORDER — ONDANSETRON 8 MG/50ML IVPB (CHCC)
8.0000 mg | Freq: Once | INTRAVENOUS | Status: AC
  Administered 2014-01-26: 8 mg via INTRAVENOUS

## 2014-01-26 MED ORDER — DEXAMETHASONE SODIUM PHOSPHATE 10 MG/ML IJ SOLN
10.0000 mg | Freq: Once | INTRAMUSCULAR | Status: AC
  Administered 2014-01-26: 10 mg via INTRAVENOUS

## 2014-01-26 MED ORDER — SODIUM CHLORIDE 0.9 % IJ SOLN
10.0000 mL | INTRAMUSCULAR | Status: DC | PRN
  Administered 2014-01-26: 10 mL via INTRAVENOUS
  Filled 2014-01-26: qty 10

## 2014-01-26 MED ORDER — FAMOTIDINE IN NACL 20-0.9 MG/50ML-% IV SOLN
20.0000 mg | Freq: Once | INTRAVENOUS | Status: AC
  Administered 2014-01-26: 20 mg via INTRAVENOUS

## 2014-01-26 MED ORDER — ONDANSETRON 8 MG/NS 50 ML IVPB
INTRAVENOUS | Status: AC
  Filled 2014-01-26: qty 8

## 2014-01-26 MED ORDER — SODIUM CHLORIDE 0.9 % IJ SOLN
10.0000 mL | INTRAMUSCULAR | Status: DC | PRN
  Administered 2014-01-26: 10 mL
  Filled 2014-01-26: qty 10

## 2014-01-26 MED ORDER — FAMOTIDINE IN NACL 20-0.9 MG/50ML-% IV SOLN
INTRAVENOUS | Status: AC
  Filled 2014-01-26: qty 50

## 2014-01-26 MED ORDER — SODIUM CHLORIDE 0.9 % IV SOLN
Freq: Once | INTRAVENOUS | Status: AC
  Administered 2014-01-26: 11:00:00 via INTRAVENOUS

## 2014-01-26 MED ORDER — HEPARIN SOD (PORK) LOCK FLUSH 100 UNIT/ML IV SOLN
500.0000 [IU] | Freq: Once | INTRAVENOUS | Status: AC | PRN
  Administered 2014-01-26: 500 [IU]
  Filled 2014-01-26: qty 5

## 2014-01-26 NOTE — Progress Notes (Signed)
[place] [phone]   [phone]  OFFICE PROGRESS NOTE  [name], [place] [address]  DIAGNOSIS: Stage IV ( T2b, N2, M1b) non-small cell lung cancer, adenocarcinoma with areas of squamous differentiation with negative EGFR mutation and negative ALK gene translocation, presented with left lower lobe lung mass in addition to mediastinal lymphadenopathy and brain metastases diagnosed in October of 2014  PRIOR THERAPY: 1) Stereotactic radiotherapy to 3 brain lesions under the care of Dr. Lisbeth Renshaw completed 12/08/2012. 2) Palliative radiotherapy to the left lower lobe lung mass under the care of Dr. Lisbeth Renshaw expected to be completed 01/01/2013. 3) Systemic chemotherapy with carboplatin for AUC of 5 and Alimta 500 mg/M2 every 3 weeks. First dose 12/23/2012. Status post 6 cycles  CURRENT THERAPY: Maintenance chemotherapy with single agent Alimta at 500 mg per meter squared given every 3 weeks. First cycle expected to be given 05/12/2013. Status post 12 cycles.   CHEMOTHERAPY INTENT: Palliative  CURRENT # OF CHEMOTHERAPY CYCLES: 13  CURRENT ANTIEMETICS:Zofran, dexamethasone and Compazine  CURRENT SMOKING STATUS: former smoker  ORAL CHEMOTHERAPY AND CONSENT: None  CURRENT BISPHOSPHONATES USE: None  PAIN MANAGEMENT: 0/10  NARCOTICS INDUCED CONSTIPATION: None  LIVING WILL AND CODE STATUS: ?  INTERVAL HISTORY: ROGEN PORTE 81 y.o. male returns to the clinic today for follow up visit accompanied by his wife. He has been on maintenance chemotherapy with single agent Alimta for the last 12 cycles. He tolerated the last cycle of his treatment fairly well except for mild fatigue. The patient denied having any nausea or vomiting. He denied having any fever or chills. He has no chest pain, shortness of breath, cough or hemoptysis. He has no weight loss or night sweats. He denied having any significant back pain. He had repeat CT  scan of the chest, abdomen and pelvis performed recently and he is here for evaluation and discussion of his scan results.   MEDICAL HISTORY: Past Medical History  Diagnosis Date  . COPD (chronic obstructive pulmonary disease)   . Allergy     allergic rhinitis  . Hx of radiation therapy 12/09/12-01/01/13    lung 37.5Gy  . nscl ca w/ brain mets dx'd 08/2012    Patient has a lung mass which is being evaluated    ALLERGIES:  is allergic to sulfa antibiotics.  MEDICATIONS:  Current Outpatient Prescriptions  Medication Sig Dispense Refill  . acetaminophen (TYLENOL) 500 MG tablet Take 500 mg by mouth every 6 (six) hours as needed for mild pain.     . clobetasol cream (TEMOVATE) 7.34 % Apply 1 application topically daily as needed (for itching).    Marland Kitchen dexamethasone (DECADRON) 4 MG tablet 4 mg by mouth twice a day the day before, day of and day after the chemotherapy every 3 weeks 40 tablet 1  . ferrous sulfate (IRON SUPPLEMENT) 325 (65 FE) MG tablet Take 325 mg by mouth daily with breakfast.    . folic acid (FOLVITE) 287 MCG tablet Take 400 mcg by mouth every morning.    . nystatin (MYCOSTATIN) 100000 UNIT/ML suspension Take 5 mLs by mouth 4 (four) times daily.    Marland Kitchen PEMEtrexed Disodium (ALIMTA IV) Inject into the vein every 21 ( twenty-one) days.    Vladimir Faster Glycol-Propyl Glycol (SYSTANE OP) Place 1 drop into both eyes daily.    . simvastatin (ZOCOR) 10 MG tablet Take 10 mg by mouth at bedtime.     No current facility-administered  medications for this visit.   Facility-Administered Medications Ordered in Other Visits  Medication Dose Route Frequency Provider Last Rate Last Dose  . sodium chloride 0.9 % injection 10 mL  10 mL Intravenous PRN Curt Bears, MD   10 mL at 01/26/14 1018    SURGICAL HISTORY:  Past Surgical History  Procedure Laterality Date  . Cholecystectomy      REVIEW OF SYSTEMS:  Constitutional: negative Eyes: negative Ears, nose, mouth, throat, and face:  negative Respiratory: negative Cardiovascular: negative Gastrointestinal: negative Genitourinary:negative Integument/breast: negative Hematologic/lymphatic: negative Musculoskeletal:negative Neurological: negative Behavioral/Psych: negative Endocrine: negative Allergic/Immunologic: negative   PHYSICAL EXAMINATION: General appearance: alert, cooperative and no distress Head: Normocephalic, without obvious abnormality, atraumatic Neck: no adenopathy, no JVD, supple, symmetrical, trachea midline and thyroid not enlarged, symmetric, no tenderness/mass/nodules Lymph nodes: Cervical, supraclavicular, and axillary nodes normal. Resp: clear to auscultation bilaterally Back: symmetric, no curvature. ROM normal. No CVA tenderness. Cardio: regular rate and rhythm, S1, S2 normal, no murmur, click, rub or gallop GI: soft, non-tender; bowel sounds normal; no masses,  no organomegaly Extremities: edema 2+ Neurologic: Alert and oriented X 3, normal strength and tone. Normal symmetric reflexes. Normal coordination and gait  ECOG PERFORMANCE STATUS: 1 - Symptomatic but completely ambulatory  Blood pressure 133/69, pulse 78, temperature 97.5 F (36.4 C), temperature source Oral, resp. rate 19, height $RemoveBe'5\' 11"'RnsuqMikf$  (1.803 m), weight 159 lb 8 oz (72.349 kg), SpO2 100 %.  LABORATORY DATA: Lab Results  Component Value Date   WBC 7.6 01/26/2014   HGB 10.3* 01/26/2014   HCT 30.2* 01/26/2014   MCV 90.4 01/26/2014   PLT 257 01/26/2014      Chemistry      Component Value Date/Time   NA 138 01/16/2014 0928   NA 138 01/05/2014 1059   NA CANCELED 10/13/2012 1149   K 3.1* 01/16/2014 0928   K 3.5 01/05/2014 1059   CL 103 01/16/2014 0928   CO2 26 01/16/2014 0928   CO2 27 01/05/2014 1059   BUN 17 01/16/2014 0928   BUN 16.9 01/05/2014 1059   BUN CANCELED 10/13/2012 1149   CREATININE 1.17 01/16/2014 0928   CREATININE 1.1 01/05/2014 1059      Component Value Date/Time   CALCIUM 8.1* 01/16/2014 0928    CALCIUM 8.7 01/05/2014 1059   ALKPHOS 247* 01/16/2014 0928   ALKPHOS 195* 01/05/2014 1059   AST 40* 01/16/2014 0928   AST 32 01/05/2014 1059   ALT 30 01/16/2014 0928   ALT 20 01/05/2014 1059   BILITOT 1.1 01/16/2014 0928   BILITOT 0.42 01/05/2014 1059       RADIOGRAPHIC STUDIES: Ct Chest W Contrast  01/22/2014   CLINICAL DATA:  Non-small cell lung cancer with metastatic disease to the brain. Ongoing chemotherapy. COPD. Radiation therapy.  EXAM: CT CHEST, ABDOMEN, AND PELVIS WITH CONTRAST  TECHNIQUE: Multidetector CT imaging of the chest, abdomen and pelvis was performed following the standard protocol during bolus administration of intravenous contrast.  CONTRAST:  172mL OMNIPAQUE IOHEXOL 300 MG/ML  SOLN  COMPARISON:  Multiple exams, including 11/20/2013  FINDINGS: CT CHEST FINDINGS  Small to moderate right pleural effusion common nonspecific for transudative versus exudative etiology. I do not see an obvious pleural mass.  Atherosclerotic aortic arch, branch vessel column and coronary arteries. Stable pericardial effusion measuring up to 1.6 cm in thickness along the cardiac apex. No obvious enhancing lesion along the pericardium.  Cavitary right lower lobe infrahilar mass 4.8 by 2.7 cm on image 33 of series 4, essentially stable.  Surrounding interstitial accentuation noted. Underlying emphysema is present with peripheral fibrosis in the lungs. Nodular density adjacent to the right inferior pulmonary ligament 1 cm in diameter on image 42 of series 4, stable.  New subtle superior endplate compression fracture at T10, as shown on image 69 of series 603. 4 mm loss of vertebral body height. No significant bony retropulsion.  CT ABDOMEN AND PELVIS FINDINGS  Hepatobiliary: Cholecystectomy.  Pancreas: Unremarkable  Spleen: Unremarkable  Adrenals/Urinary Tract: Punctate 2 mm right kidney lower pole nonobstructive calculus.  Stomach/Bowel: Scattered sigmoid colon diverticula.  Vascular/Lymphatic: Aortoiliac  atherosclerotic vascular disease.  Reproductive: Mildly prominent prostate gland, 5.5 by 3.7 cm.  Other: No supplemental non-categorized findings.  Musculoskeletal: Stable appearance of degenerative grade 1 anterolisthesis at L5-S1 with facet arthropathy likely causing foraminal impingement at L5-S1 on the right, and at L4-5 on the left.  IMPRESSION: 1. Stable appearance of the dominant cavitary right lower lobe infrahilar mass and adjacent satellite lesion. Similar appearance of right pleural and pericardial effusion. 2. New mild superior endplate compression fracture at T10, without significant bony retropulsion. 3. Stable nonobstructive right nephrolithiasis. 4. Sigmoid colon diverticulosis. 5. Lower lumbar spondylosis and degenerative disc disease.   Electronically Signed   By: Sherryl Barters M.D.   On: 01/22/2014 14:30   Ct Abdomen Pelvis W Contrast  01/22/2014   CLINICAL DATA:  Non-small cell lung cancer with metastatic disease to the brain. Ongoing chemotherapy. COPD. Radiation therapy.  EXAM: CT CHEST, ABDOMEN, AND PELVIS WITH CONTRAST  TECHNIQUE: Multidetector CT imaging of the chest, abdomen and pelvis was performed following the standard protocol during bolus administration of intravenous contrast.  CONTRAST:  168mL OMNIPAQUE IOHEXOL 300 MG/ML  SOLN  COMPARISON:  Multiple exams, including 11/20/2013  FINDINGS: CT CHEST FINDINGS  Small to moderate right pleural effusion common nonspecific for transudative versus exudative etiology. I do not see an obvious pleural mass.  Atherosclerotic aortic arch, branch vessel column and coronary arteries. Stable pericardial effusion measuring up to 1.6 cm in thickness along the cardiac apex. No obvious enhancing lesion along the pericardium.  Cavitary right lower lobe infrahilar mass 4.8 by 2.7 cm on image 33 of series 4, essentially stable. Surrounding interstitial accentuation noted. Underlying emphysema is present with peripheral fibrosis in the lungs. Nodular  density adjacent to the right inferior pulmonary ligament 1 cm in diameter on image 42 of series 4, stable.  New subtle superior endplate compression fracture at T10, as shown on image 69 of series 603. 4 mm loss of vertebral body height. No significant bony retropulsion.  CT ABDOMEN AND PELVIS FINDINGS  Hepatobiliary: Cholecystectomy.  Pancreas: Unremarkable  Spleen: Unremarkable  Adrenals/Urinary Tract: Punctate 2 mm right kidney lower pole nonobstructive calculus.  Stomach/Bowel: Scattered sigmoid colon diverticula.  Vascular/Lymphatic: Aortoiliac atherosclerotic vascular disease.  Reproductive: Mildly prominent prostate gland, 5.5 by 3.7 cm.  Other: No supplemental non-categorized findings.  Musculoskeletal: Stable appearance of degenerative grade 1 anterolisthesis at L5-S1 with facet arthropathy likely causing foraminal impingement at L5-S1 on the right, and at L4-5 on the left.  IMPRESSION: 1. Stable appearance of the dominant cavitary right lower lobe infrahilar mass and adjacent satellite lesion. Similar appearance of right pleural and pericardial effusion. 2. New mild superior endplate compression fracture at T10, without significant bony retropulsion. 3. Stable nonobstructive right nephrolithiasis. 4. Sigmoid colon diverticulosis. 5. Lower lumbar spondylosis and degenerative disc disease.   Electronically Signed   By: Sherryl Barters M.D.   On: 01/22/2014 14:30   ASSESSMENT AND PLAN: this is  a very pleasant 81 years old white male with metastatic non-small cell lung cancer, adenocarcinoma with brain metastasis status post stereotactic radiotherapy to the brain lesions followed by palliative radiotherapy to the left lower lobe lung mass. The patient is currently undergoing systemic chemotherapy with carboplatin and Alimta status post 6 cycles with stable disease and he is currently undergoing maintenance chemotherapy with single agent Alimta status post 12 cycles. He is tolerating the treatment well  except for mild fatigue secondary to chemotherapy-induced anemia  The recent CT scan of the chest, abdomen and pelvis showed no evidence for disease progression. He has compression fracture of the T10 but no significant pain. I discussed the scan results with the patient today. I recommended for him to continue his current treatment with maintenance chemotherapy with single agent Alimta. He'll proceed with cycle #13 today and the patient would come back for followup visit in 3 weeks with the next cycle of his treatment. He was advised to call immediately if he has any concerning symptoms in the interval. The patient voices understanding of current disease status and treatment options and is in agreement with the current care plan.  All questions were answered. The patient knows to call the clinic with any problems, questions or concerns. We can certainly see the patient much sooner if necessary.  Disclaimer: This note was dictated with voice recognition software. Similar sounding words can inadvertently be transcribed and may not be corrected upon review.

## 2014-01-26 NOTE — Patient Instructions (Signed)

## 2014-01-26 NOTE — Telephone Encounter (Signed)
Pt confirmed labs/ov per 01/12 POF, gave pt AVS.... KJ, sent msg to add chemo

## 2014-01-26 NOTE — Patient Instructions (Signed)
[facility] Discharge Instructions for Patients Receiving Chemotherapy  Today you received the following chemotherapy agents alimta  To help prevent nausea and vomiting after your treatment, we encourage you to take your nausea medication as directed   If you develop nausea and vomiting that is not controlled by your nausea medication, call the clinic.   BELOW ARE SYMPTOMS THAT SHOULD BE REPORTED IMMEDIATELY:  *FEVER GREATER THAN 100.5 F  *CHILLS WITH OR WITHOUT FEVER  NAUSEA AND VOMITING THAT IS NOT CONTROLLED WITH YOUR NAUSEA MEDICATION  *UNUSUAL SHORTNESS OF BREATH  *UNUSUAL BRUISING OR BLEEDING  TENDERNESS IN MOUTH AND THROAT WITH OR WITHOUT PRESENCE OF ULCERS  *URINARY PROBLEMS  *BOWEL PROBLEMS  UNUSUAL RASH Items with * indicate a potential emergency and should be followed up as soon as possible.  Feel free to call the clinic you have any questions or concerns. The clinic phone number is [phone].

## 2014-02-04 ENCOUNTER — Encounter: Payer: Self-pay | Admitting: Nurse Practitioner

## 2014-02-04 ENCOUNTER — Ambulatory Visit (HOSPITAL_COMMUNITY)
Admission: RE | Admit: 2014-02-04 | Discharge: 2014-02-04 | Disposition: A | Discharge status: Home / Self Care | Payer: Medicare Other | Source: Ambulatory Visit | Attending: Nurse Practitioner | Admitting: Nurse Practitioner

## 2014-02-04 ENCOUNTER — Other Ambulatory Visit: Payer: Self-pay | Admitting: Nurse Practitioner

## 2014-02-04 ENCOUNTER — Telehealth: Payer: Self-pay | Admitting: Nurse Practitioner

## 2014-02-04 ENCOUNTER — Ambulatory Visit (HOSPITAL_BASED_OUTPATIENT_CLINIC_OR_DEPARTMENT_OTHER): Payer: Medicare Other

## 2014-02-04 ENCOUNTER — Telehealth: Payer: Self-pay | Admitting: *Deleted

## 2014-02-04 ENCOUNTER — Other Ambulatory Visit (HOSPITAL_BASED_OUTPATIENT_CLINIC_OR_DEPARTMENT_OTHER): Payer: Medicare Other

## 2014-02-04 ENCOUNTER — Ambulatory Visit (HOSPITAL_BASED_OUTPATIENT_CLINIC_OR_DEPARTMENT_OTHER): Payer: Medicare Other | Admitting: Nurse Practitioner

## 2014-02-04 ENCOUNTER — Other Ambulatory Visit: Payer: Self-pay | Admitting: Radiation Therapy

## 2014-02-04 VITALS — BP 112/66 | HR 114 | Temp 98.0°F | Resp 18 | Ht 71.0 in | Wt 156.1 lb

## 2014-02-04 VITALS — BP 104/77 | HR 128

## 2014-02-04 DIAGNOSIS — C3431 Malignant neoplasm of lower lobe, right bronchus or lung: Secondary | ICD-10-CM

## 2014-02-04 DIAGNOSIS — R0600 Dyspnea, unspecified: Secondary | ICD-10-CM | POA: Diagnosis not present

## 2014-02-04 DIAGNOSIS — D61818 Other pancytopenia: Secondary | ICD-10-CM

## 2014-02-04 DIAGNOSIS — R5383 Other fatigue: Secondary | ICD-10-CM | POA: Insufficient documentation

## 2014-02-04 DIAGNOSIS — Z452 Encounter for adjustment and management of vascular access device: Secondary | ICD-10-CM | POA: Diagnosis not present

## 2014-02-04 DIAGNOSIS — J984 Other disorders of lung: Secondary | ICD-10-CM | POA: Diagnosis not present

## 2014-02-04 DIAGNOSIS — J90 Pleural effusion, not elsewhere classified: Secondary | ICD-10-CM | POA: Insufficient documentation

## 2014-02-04 DIAGNOSIS — C7931 Secondary malignant neoplasm of brain: Secondary | ICD-10-CM | POA: Diagnosis not present

## 2014-02-04 DIAGNOSIS — R609 Edema, unspecified: Secondary | ICD-10-CM | POA: Diagnosis not present

## 2014-02-04 DIAGNOSIS — Z85118 Personal history of other malignant neoplasm of bronchus and lung: Secondary | ICD-10-CM | POA: Insufficient documentation

## 2014-02-04 DIAGNOSIS — Z95828 Presence of other vascular implants and grafts: Secondary | ICD-10-CM

## 2014-02-04 DIAGNOSIS — R530 Neoplastic (malignant) related fatigue: Secondary | ICD-10-CM | POA: Insufficient documentation

## 2014-02-04 DIAGNOSIS — R0602 Shortness of breath: Secondary | ICD-10-CM | POA: Insufficient documentation

## 2014-02-04 LAB — COMPREHENSIVE METABOLIC PANEL (CC13)
ALBUMIN: 2.5 g/dL — AB (ref 3.5–5.0)
ALT: 52 U/L (ref 0–55)
AST: 58 U/L — AB (ref 5–34)
Alkaline Phosphatase: 295 U/L — ABNORMAL HIGH (ref 40–150)
Anion Gap: 11 mEq/L (ref 3–11)
BUN: 15.6 mg/dL (ref 7.0–26.0)
CO2: 24 mEq/L (ref 22–29)
Calcium: 8.1 mg/dL — ABNORMAL LOW (ref 8.4–10.4)
Chloride: 104 mEq/L (ref 98–109)
Creatinine: 1.1 mg/dL (ref 0.7–1.3)
EGFR: 65 mL/min/{1.73_m2} — AB (ref >90)
GLUCOSE: 119 mg/dL (ref 70–140)
POTASSIUM: 3.6 meq/L (ref 3.5–5.1)
Sodium: 138 mEq/L (ref 136–145)
TOTAL PROTEIN: 5.7 g/dL — AB (ref 6.4–8.3)
Total Bilirubin: 0.6 mg/dL (ref 0.20–1.20)

## 2014-02-04 LAB — CBC WITH DIFFERENTIAL/PLATELET
BASO%: 1.2 % (ref 0.0–2.0)
Basophils Absolute: 0 10*3/uL (ref 0.0–0.1)
EOS ABS: 0.1 10*3/uL (ref 0.0–0.5)
EOS%: 7.1 % — ABNORMAL HIGH (ref 0.0–7.0)
HEMATOCRIT: 29.3 % — AB (ref 38.4–49.9)
HGB: 9.6 g/dL — ABNORMAL LOW (ref 13.0–17.1)
LYMPH#: 0.3 10*3/uL — AB (ref 0.9–3.3)
LYMPH%: 22.8 % (ref 14.0–49.0)
MCH: 30.3 pg (ref 27.2–33.4)
MCHC: 32.8 g/dL (ref 32.0–36.0)
MCV: 92.4 fL (ref 79.3–98.0)
MONO#: 0 10*3/uL — AB (ref 0.1–0.9)
MONO%: 3.3 % (ref 0.0–14.0)
NEUT%: 65.6 % (ref 39.0–75.0)
NEUTROS ABS: 0.9 10*3/uL — AB (ref 1.5–6.5)
Platelets: 53 10*3/uL — ABNORMAL LOW (ref 140–400)
RBC: 3.17 10*6/uL — ABNORMAL LOW (ref 4.20–5.82)
RDW: 16.7 % — ABNORMAL HIGH (ref 11.0–14.6)
WBC: 1.4 10*3/uL — ABNORMAL LOW (ref 4.0–10.3)

## 2014-02-04 MED ORDER — HEPARIN SOD (PORK) LOCK FLUSH 100 UNIT/ML IV SOLN
500.0000 [IU] | Freq: Once | INTRAVENOUS | Status: AC
  Administered 2014-02-04: 500 [IU] via INTRAVENOUS
  Filled 2014-02-04: qty 5

## 2014-02-04 MED ORDER — SODIUM CHLORIDE 0.9 % IJ SOLN
10.0000 mL | INTRAMUSCULAR | Status: DC | PRN
  Administered 2014-02-04: 10 mL via INTRAVENOUS
  Filled 2014-02-04: qty 10

## 2014-02-04 NOTE — Assessment & Plan Note (Signed)
Patient continues with single agent Alimta chemotherapy.  Patient last received his chemotherapy on 01/26/2014.  He has plans to return for his next cycle of the same chemotherapy regimen on 02/16/2014.

## 2014-02-04 NOTE — Progress Notes (Signed)
SYMPTOM MANAGEMENT CLINIC   HPI: [name] 81 y.o. male diagnosed with lung cancer with brain metastasis.  Patient is status post stereotactic radiotherapy to the brain; and currently undergoing single agent Alimta chemotherapy regimen.  Patient called the cancer Center this afternoon complaining of mild increased dyspnea with exertion.  He is also complaining of worsening fatigue.  He denies any chest pain, chest pressure, or pain with inspiration.  He denies any recent fevers or chills.  He also suffers with chronic lower extremity edema; states he takes Lasix on an as-needed basis.  Patient denies any GI symptoms whatsoever.   HPI  CURRENT THERAPY: Upcoming Treatment Dates - LUNG Pemetrexed (Alimta) q21d  Days with orders from any treatment category:  02/16/2014      SCHEDULING COMMUNICATION      famotidine (PEPCID) IVPB 20 mg      ondansetron (ZOFRAN) IVPB 8 mg      dexamethasone (DECADRON) injection 10 mg      PEMEtrexed (ALIMTA) 975 mg in sodium chloride 0.9 % 100 mL chemo infusion      sodium chloride 0.9 % injection 10 mL      heparin lock flush 100 unit/mL      heparin lock flush 100 unit/mL      alteplase (CATHFLO ACTIVASE) injection 2 mg      sodium chloride 0.9 % injection 3 mL      0.9 %  sodium chloride infusion      TREATMENT CONDITIONS 03/09/2014      SCHEDULING COMMUNICATION      famotidine (PEPCID) IVPB 20 mg      ondansetron (ZOFRAN) IVPB 8 mg      dexamethasone (DECADRON) injection 10 mg      PEMEtrexed (ALIMTA) 975 mg in sodium chloride 0.9 % 100 mL chemo infusion      sodium chloride 0.9 % injection 10 mL      heparin lock flush 100 unit/mL      heparin lock flush 100 unit/mL      alteplase (CATHFLO ACTIVASE) injection 2 mg      sodium chloride 0.9 % injection 3 mL      0.9 %  sodium chloride infusion      TREATMENT CONDITIONS 03/30/2014      SCHEDULING COMMUNICATION      famotidine (PEPCID) IVPB 20 mg      ondansetron (ZOFRAN) IVPB 8 mg  dexamethasone (DECADRON) injection 10 mg      PEMEtrexed (ALIMTA) 975 mg in sodium chloride 0.9 % 100 mL chemo infusion      sodium chloride 0.9 % injection 10 mL      heparin lock flush 100 unit/mL      heparin lock flush 100 unit/mL      alteplase (CATHFLO ACTIVASE) injection 2 mg      sodium chloride 0.9 % injection 3 mL      0.9 %  sodium chloride infusion      TREATMENT CONDITIONS    ROS  Past Medical History  Diagnosis Date  . COPD (chronic obstructive pulmonary disease)   . Allergy     allergic rhinitis  . Hx of radiation therapy 12/09/12-01/01/13    lung 37.5Gy  . nscl ca w/ brain mets dx'd 08/2012    Patient has a lung mass which is being evaluated    Past Surgical History  Procedure Laterality Date  . Cholecystectomy      has CHOLEDOCHOLITHIASIS; COPD (chronic obstructive pulmonary disease); Allergy; Cavitating mass  in right lower lung lobe; Primary cancer of right lower lobe of lung; Brain metastasis; Weakness generalized; Swelling of right lower extremity; Anemia; Symptomatic anemia; Neoplastic malignant related fatigue; Dyspnea; Peripheral edema; and Other pancytopenia on his problem list.    is allergic to asa and sulfa antibiotics.    Medication List       This list is accurate as of: 02/04/14  6:54 PM.  Always use your most recent med list.               acetaminophen 500 MG tablet  Commonly known as:  TYLENOL  Take 500 mg by mouth every 6 (six) hours as needed for mild pain.     ALIMTA IV  Inject into the vein every 21 ( twenty-one) days.     clobetasol cream 0.05 %  Commonly known as:  TEMOVATE  Apply 1 application topically daily as needed (for itching).     dexamethasone 4 MG tablet  Commonly known as:  DECADRON  4 mg by mouth twice a day the day before, day of and day after the chemotherapy every 3 weeks     folic acid 540 MCG tablet  Commonly known as:  FOLVITE  Take 400 mcg by mouth every morning.     IRON SUPPLEMENT 325 (65 FE) MG  tablet  Generic drug:  ferrous sulfate  Take 325 mg by mouth daily with breakfast.     nystatin 100000 UNIT/ML suspension  Commonly known as:  MYCOSTATIN  Take 5 mLs by mouth 4 (four) times daily.     simvastatin 10 MG tablet  Commonly known as:  ZOCOR  Take 10 mg by mouth at bedtime.     SYSTANE OP  Place 1 drop into both eyes daily.         PHYSICAL EXAMINATION  Blood pressure 112/66, pulse 114, temperature 98 F (36.7 C), temperature source Oral, resp. rate 18, height $RemoveBe'5\' 11"'xIzkscQRu$  (1.803 m), weight 156 lb 1.6 oz (70.806 kg), SpO2 99 %.  Physical Exam  Constitutional: He is oriented to person, place, and time. He appears unhealthy.  HENT:  Head: Normocephalic and atraumatic.  Mouth/Throat: Oropharynx is clear and moist.  Patient complaining of 1 tiny oral lesion underneath his tongue; but no evidence of thrush on exam.  Patient feels that this one oral lesion is most likely secondary to his dentures rubbing.  Eyes: Conjunctivae and EOM are normal. Pupils are equal, round, and reactive to light. Right eye exhibits no discharge. Left eye exhibits no discharge. No scleral icterus.  Neck: Normal range of motion. Neck supple. No JVD present. No tracheal deviation present. No thyromegaly present.  Cardiovascular: Regular rhythm, normal heart sounds and intact distal pulses.   Pulmonary/Chest: Effort normal and breath sounds normal. No respiratory distress. He has no wheezes. He has no rales. He exhibits no tenderness.  Abdominal: Soft. Bowel sounds are normal. He exhibits no distension and no mass. There is no tenderness. There is no rebound and no guarding.  Musculoskeletal: Normal range of motion. He exhibits edema.  +1 edema to bilateral lower extremities.  Lymphadenopathy:    He has no cervical adenopathy.  Neurological: He is alert and oriented to person, place, and time.  Skin: Skin is warm and dry. No rash noted. No erythema. There is pallor.  Psychiatric: Affect normal.    Nursing note and vitals reviewed.   LABORATORY DATA:. Appointment on 02/04/2014  Component Date Value Ref Range Status  . WBC 02/04/2014 1.4* 4.0 -  10.3 10e3/uL Final  . NEUT# 02/04/2014 0.9* 1.5 - 6.5 10e3/uL Final  . HGB 02/04/2014 9.6* 13.0 - 17.1 g/dL Final  . HCT 02/04/2014 29.3* 38.4 - 49.9 % Final  . Platelets 02/04/2014 53* 140 - 400 10e3/uL Final  . MCV 02/04/2014 92.4  79.3 - 98.0 fL Final  . MCH 02/04/2014 30.3  27.2 - 33.4 pg Final  . MCHC 02/04/2014 32.8  32.0 - 36.0 g/dL Final  . RBC 02/04/2014 3.17* 4.20 - 5.82 10e6/uL Final  . RDW 02/04/2014 16.7* 11.0 - 14.6 % Final  . lymph# 02/04/2014 0.3* 0.9 - 3.3 10e3/uL Final  . MONO# 02/04/2014 0.0* 0.1 - 0.9 10e3/uL Final  . Eosinophils Absolute 02/04/2014 0.1  0.0 - 0.5 10e3/uL Final  . Basophils Absolute 02/04/2014 0.0  0.0 - 0.1 10e3/uL Final  . NEUT% 02/04/2014 65.6  39.0 - 75.0 % Final  . LYMPH% 02/04/2014 22.8  14.0 - 49.0 % Final  . MONO% 02/04/2014 3.3  0.0 - 14.0 % Final  . EOS% 02/04/2014 7.1* 0.0 - 7.0 % Final  . BASO% 02/04/2014 1.2  0.0 - 2.0 % Final  . Sodium 02/04/2014 138  136 - 145 mEq/L Final  . Potassium 02/04/2014 3.6  3.5 - 5.1 mEq/L Final  . Chloride 02/04/2014 104  98 - 109 mEq/L Final  . CO2 02/04/2014 24  22 - 29 mEq/L Final  . Glucose 02/04/2014 119  70 - 140 mg/dl Final  . BUN 02/04/2014 15.6  7.0 - 26.0 mg/dL Final  . Creatinine 02/04/2014 1.1  0.7 - 1.3 mg/dL Final  . Total Bilirubin 02/04/2014 0.60  0.20 - 1.20 mg/dL Final  . Alkaline Phosphatase 02/04/2014 295* 40 - 150 U/L Final  . AST 02/04/2014 58* 5 - 34 U/L Final  . ALT 02/04/2014 52  0 - 55 U/L Final  . Total Protein 02/04/2014 5.7* 6.4 - 8.3 g/dL Final  . Albumin 02/04/2014 2.5* 3.5 - 5.0 g/dL Final  . Calcium 02/04/2014 8.1* 8.4 - 10.4 mg/dL Final  . Anion Gap 02/04/2014 11  3 - 11 mEq/L Final  . EGFR 02/04/2014 65* >90 ml/min/1.73 m2 Final   eGFR is calculated using the CKD-EPI Creatinine Equation (2009)     RADIOGRAPHIC  STUDIES: Dg Chest 2 View  02/04/2014   CLINICAL DATA:  Shortness of breath and increased fatigue, history of lung carcinoma  EXAM: CHEST  2 VIEW  COMPARISON:  01/22/2014  FINDINGS: Right chest wall port is again seen and stable. There trans perihilar scarring again identified stable from the prior exam. Chronic interstitial changes are noted throughout the left lung. The known cavitary lesion in the right lower lobe is again visualized and stable. It is adjacent spiculated lesion is not well appreciated on this exam. A moderate right-sided pleural effusion is again noted and stable.  IMPRESSION: No significant interval change from the prior exam.  Stable changes in the right lung consistent with the given clinical history.  Stable right-sided effusion.   Electronically Signed   By: Inez Catalina M.D.   On: 02/04/2014 16:35    ASSESSMENT/PLAN:    Dyspnea  Patient complaining of increased dyspnea with exertion only since last chemotherapy obtained on 01/26/2014.  Patient denies any chest pain, chest pressure, or pain with inspiration.  Chest x-ray obtained today revealed no pneumonia or other acute findings.  O2 sat was 99% on room air.  Patient observed in no acute respiratory distress whatsoever.  Most likely, complaining of increased dyspnea with exertion is  secondary to chemotherapy side effect of recent chemotherapy.  However, patient was advised to call/return or go directly to the emergency department if he develops any worsening symptoms whatsoever.   Neoplastic malignant related fatigue Patient is complaining of some increased fatigue since his last chemotherapy as well.  Most likely this is a chemotherapy effect of patient's recent chemotherapy.  Patient was encouraged to remain as active as possible.   Other pancytopenia Chemotherapy-induced pancytopenia noted with an ANC of 0.9, hemoglobin of 9.6, and platelet count of 53.  Briefly reviewed all neutropenia guidelines with both patient and  his wife today.  Patient denies any worsening issues with either easy bleeding or bruising today.  Will continue to monitor closely.   Peripheral edema Patient has history of chronic bilateral lower extremity edema.  Patient states that he does take Lasix on an as-needed basis.   Primary cancer of right lower lobe of lung Patient continues with single agent Alimta chemotherapy.  Patient last received his chemotherapy on 01/26/2014.  He has plans to return for his next cycle of the same chemotherapy regimen on 02/16/2014.   Patient stated understanding of all instructions; and was in agreement with this plan of care. The patient knows to call the clinic with any problems, questions or concerns.   This was a shared visit with Dr. Julien Nordmann today.  Total time spent with patient was 25 minutes;  with greater than 75 percent of that time spent in face to face counseling regarding his symptoms, and coordination of care and follow up.  Disclaimer: This note was dictated with voice recognition software. Similar sounding words can inadvertently be transcribed and may not be corrected upon review.   Drue Second, NP 02/04/2014   ADDENDUM: Hematology/Oncology Attending: I had a face to face encounter with the patient. I recommended his care plan. This is a very pleasant 81 years old white male with metastatic non-small cell lung cancer, adenocarcinoma status post induction chemotherapy with carboplatin and Alimta and currently undergoing maintenance chemotherapy with single agent Alimta status post 13 cycles and tolerating his treatment fairly well. The most recent CT scan of the chest, abdomen and pelvis he weeks ago showed no significant evidence for disease progression. The patient came to the clinic today for a symptom management visit complaining of poor sitting dyspnea and fatigue over the last few days. We requested a stat CBC as well as chest x-ray today that showed no significant abnormality  except for mild anemia. His oxygen saturation was over 90% on room air. His symptoms could be just secondary to fatigue from his recent cycle of the chemotherapy. I assured the patient and recommended for him to call if he has any worsening of his condition. He would come back for follow-up visit as previously scheduled with the start of cycle #14 on 02/16/2014.  Disclaimer: This note was dictated with voice recognition software. Similar sounding words can inadvertently be transcribed and may be missed upon review. Eilleen Kempf., MD _0 (<PARAMETER> error)@

## 2014-02-04 NOTE — Patient Instructions (Signed)

## 2014-02-04 NOTE — Telephone Encounter (Signed)
added pt for lb/SMC per 1/21 pof. per pof pt on his way. no other orders per pof.

## 2014-02-04 NOTE — Assessment & Plan Note (Signed)
Patient complaining of increased dyspnea with exertion only since last chemotherapy obtained on 01/26/2014.  Patient denies any chest pain, chest pressure, or pain with inspiration.  Chest x-ray obtained today revealed no pneumonia or other acute findings.  O2 sat was 99% on room air.  Patient observed in no acute respiratory distress whatsoever.  Most likely, complaining of increased dyspnea with exertion is secondary to chemotherapy side effect of recent chemotherapy.  However, patient was advised to call/return or go directly to the emergency department if he develops any worsening symptoms whatsoever.

## 2014-02-04 NOTE — Telephone Encounter (Signed)
Spouse Opal Sidles called reporting "[name] is weak, so.ob., and can't do his normal day to day activities.  Can stand for just a few minutes and then has to sit or lie down.  Received Alimta on 01-26-2014."  Dr. Julien Nordmann notified.  Verbal orders received for ER or cbc, cmet, cxr and symptom management clinic. Notified Cyndee Berniece Salines and called Jane at 2:30 pm.  Lives in [place] and can arrive within 45 minutes.  Will obtain labs first due to time followed by Cyndee seeing patient in Lab followed by CXR.

## 2014-02-04 NOTE — Assessment & Plan Note (Signed)
Patient is complaining of some increased fatigue since his last chemotherapy as well.  Most likely this is a chemotherapy effect of patient's recent chemotherapy.  Patient was encouraged to remain as active as possible.

## 2014-02-04 NOTE — Assessment & Plan Note (Signed)
Chemotherapy-induced pancytopenia noted with an ANC of 0.9, hemoglobin of 9.6, and platelet count of 53.  Briefly reviewed all neutropenia guidelines with both patient and his wife today.  Patient denies any worsening issues with either easy bleeding or bruising today.  Will continue to monitor closely.

## 2014-02-04 NOTE — Assessment & Plan Note (Signed)
Patient has history of chronic bilateral lower extremity edema.  Patient states that he does take Lasix on an as-needed basis.

## 2014-02-04 NOTE — Addendum Note (Signed)
Addended by: Brien Few on: 02/04/2014 05:18 PM   Modules accepted: Orders, SmartSet

## 2014-02-05 ENCOUNTER — Telehealth: Payer: Self-pay | Admitting: *Deleted

## 2014-02-05 NOTE — Telephone Encounter (Signed)
LVM for pt to f/u on 02/04/14 visit. No answer.

## 2014-02-16 ENCOUNTER — Ambulatory Visit: Payer: Medicare Other

## 2014-02-16 ENCOUNTER — Ambulatory Visit (HOSPITAL_BASED_OUTPATIENT_CLINIC_OR_DEPARTMENT_OTHER): Payer: Medicare Other

## 2014-02-16 ENCOUNTER — Encounter: Payer: Self-pay | Admitting: Physician Assistant

## 2014-02-16 ENCOUNTER — Telehealth: Payer: Self-pay | Admitting: Internal Medicine

## 2014-02-16 ENCOUNTER — Other Ambulatory Visit (HOSPITAL_BASED_OUTPATIENT_CLINIC_OR_DEPARTMENT_OTHER): Payer: Medicare Other

## 2014-02-16 ENCOUNTER — Ambulatory Visit (HOSPITAL_BASED_OUTPATIENT_CLINIC_OR_DEPARTMENT_OTHER): Payer: Medicare Other | Admitting: Physician Assistant

## 2014-02-16 ENCOUNTER — Ambulatory Visit (HOSPITAL_COMMUNITY)
Admission: RE | Admit: 2014-02-16 | Discharge: 2014-02-16 | Disposition: A | Discharge status: Home / Self Care | Payer: Medicare Other | Source: Ambulatory Visit | Attending: Internal Medicine | Admitting: Internal Medicine

## 2014-02-16 ENCOUNTER — Other Ambulatory Visit: Payer: Self-pay | Admitting: Medical Oncology

## 2014-02-16 VITALS — BP 122/69 | HR 103 | Temp 97.8°F | Resp 18 | Ht 71.0 in | Wt 158.4 lb

## 2014-02-16 VITALS — BP 111/60 | HR 93 | Temp 97.7°F | Resp 20

## 2014-02-16 DIAGNOSIS — Z923 Personal history of irradiation: Secondary | ICD-10-CM | POA: Insufficient documentation

## 2014-02-16 DIAGNOSIS — C3431 Malignant neoplasm of lower lobe, right bronchus or lung: Secondary | ICD-10-CM

## 2014-02-16 DIAGNOSIS — J449 Chronic obstructive pulmonary disease, unspecified: Secondary | ICD-10-CM | POA: Insufficient documentation

## 2014-02-16 DIAGNOSIS — Z87891 Personal history of nicotine dependence: Secondary | ICD-10-CM | POA: Insufficient documentation

## 2014-02-16 DIAGNOSIS — T451X5A Adverse effect of antineoplastic and immunosuppressive drugs, initial encounter: Principal | ICD-10-CM

## 2014-02-16 DIAGNOSIS — C3432 Malignant neoplasm of lower lobe, left bronchus or lung: Secondary | ICD-10-CM | POA: Insufficient documentation

## 2014-02-16 DIAGNOSIS — D6481 Anemia due to antineoplastic chemotherapy: Secondary | ICD-10-CM | POA: Diagnosis not present

## 2014-02-16 DIAGNOSIS — C7931 Secondary malignant neoplasm of brain: Secondary | ICD-10-CM | POA: Insufficient documentation

## 2014-02-16 DIAGNOSIS — Z95828 Presence of other vascular implants and grafts: Secondary | ICD-10-CM

## 2014-02-16 DIAGNOSIS — C3490 Malignant neoplasm of unspecified part of unspecified bronchus or lung: Secondary | ICD-10-CM

## 2014-02-16 LAB — COMPREHENSIVE METABOLIC PANEL (CC13)
ALT: 35 U/L (ref 0–55)
AST: 41 U/L — ABNORMAL HIGH (ref 5–34)
Albumin: 2.6 g/dL — ABNORMAL LOW (ref 3.5–5.0)
Alkaline Phosphatase: 287 U/L — ABNORMAL HIGH (ref 40–150)
Anion Gap: 11 mEq/L (ref 3–11)
BUN: 14.5 mg/dL (ref 7.0–26.0)
CHLORIDE: 103 meq/L (ref 98–109)
CO2: 25 meq/L (ref 22–29)
Calcium: 8 mg/dL — ABNORMAL LOW (ref 8.4–10.4)
Creatinine: 1.3 mg/dL (ref 0.7–1.3)
EGFR: 51 mL/min/{1.73_m2} — AB (ref >90)
GLUCOSE: 107 mg/dL (ref 70–140)
Potassium: 3.5 mEq/L (ref 3.5–5.1)
Sodium: 138 mEq/L (ref 136–145)
TOTAL PROTEIN: 5.8 g/dL — AB (ref 6.4–8.3)
Total Bilirubin: 0.43 mg/dL (ref 0.20–1.20)

## 2014-02-16 LAB — CBC WITH DIFFERENTIAL/PLATELET
BASO%: 0.2 % (ref 0.0–2.0)
Basophils Absolute: 0 10*3/uL (ref 0.0–0.1)
EOS ABS: 0.1 10*3/uL (ref 0.0–0.5)
EOS%: 1.7 % (ref 0.0–7.0)
HCT: 22.8 % — ABNORMAL LOW (ref 38.4–49.9)
HGB: 7.7 g/dL — ABNORMAL LOW (ref 13.0–17.1)
LYMPH#: 0.3 10*3/uL — AB (ref 0.9–3.3)
LYMPH%: 8.6 % — ABNORMAL LOW (ref 14.0–49.0)
MCH: 31.1 pg (ref 27.2–33.4)
MCHC: 33.9 g/dL (ref 32.0–36.0)
MCV: 91.6 fL (ref 79.3–98.0)
MONO#: 0.5 10*3/uL (ref 0.1–0.9)
MONO%: 17.9 % — AB (ref 0.0–14.0)
NEUT%: 71.6 % (ref 39.0–75.0)
NEUTROS ABS: 2.1 10*3/uL (ref 1.5–6.5)
PLATELETS: 173 10*3/uL (ref 140–400)
RBC: 2.49 10*6/uL — AB (ref 4.20–5.82)
RDW: 16.4 % — ABNORMAL HIGH (ref 11.0–14.6)
WBC: 2.9 10*3/uL — ABNORMAL LOW (ref 4.0–10.3)

## 2014-02-16 LAB — PREPARE RBC (CROSSMATCH)

## 2014-02-16 LAB — HOLD TUBE, BLOOD BANK

## 2014-02-16 MED ORDER — SODIUM CHLORIDE 0.9 % IJ SOLN
10.0000 mL | INTRAMUSCULAR | Status: DC | PRN
  Administered 2014-02-16: 10 mL via INTRAVENOUS
  Filled 2014-02-16: qty 10

## 2014-02-16 MED ORDER — ACETAMINOPHEN 325 MG PO TABS
650.0000 mg | ORAL_TABLET | Freq: Once | ORAL | Status: AC
  Administered 2014-02-16: 650 mg via ORAL

## 2014-02-16 MED ORDER — ACETAMINOPHEN 325 MG PO TABS
ORAL_TABLET | ORAL | Status: AC
  Filled 2014-02-16: qty 2

## 2014-02-16 MED ORDER — HEPARIN SOD (PORK) LOCK FLUSH 100 UNIT/ML IV SOLN
500.0000 [IU] | Freq: Every day | INTRAVENOUS | Status: AC | PRN
  Administered 2014-02-16: 500 [IU]
  Filled 2014-02-16: qty 5

## 2014-02-16 MED ORDER — DIPHENHYDRAMINE HCL 25 MG PO CAPS
ORAL_CAPSULE | ORAL | Status: AC
  Filled 2014-02-16: qty 1

## 2014-02-16 MED ORDER — SODIUM CHLORIDE 0.9 % IJ SOLN
10.0000 mL | INTRAMUSCULAR | Status: AC | PRN
  Administered 2014-02-16: 10 mL
  Filled 2014-02-16: qty 10

## 2014-02-16 MED ORDER — AZITHROMYCIN 250 MG PO TABS: ORAL_TABLET | ORAL | Status: DC

## 2014-02-16 MED ORDER — DEXAMETHASONE 4 MG PO TABS: ORAL_TABLET | ORAL | Status: DC

## 2014-02-16 MED ORDER — SODIUM CHLORIDE 0.9 % IV SOLN
250.0000 mL | Freq: Once | INTRAVENOUS | Status: AC
  Administered 2014-02-16: 250 mL via INTRAVENOUS

## 2014-02-16 MED ORDER — DIPHENHYDRAMINE HCL 25 MG PO CAPS
25.0000 mg | ORAL_CAPSULE | Freq: Once | ORAL | Status: AC
  Administered 2014-02-16: 25 mg via ORAL

## 2014-02-16 NOTE — Patient Instructions (Signed)
Your hemoglobin is low and we will arrange to give you 2 units of blood to address your level of anemia Chemotherapy is being postponed by 1 week Follow-up again in 4 weeks prior to the next scheduled cycle of maintenance chemotherapy

## 2014-02-16 NOTE — Patient Instructions (Signed)

## 2014-02-16 NOTE — Telephone Encounter (Signed)
, °

## 2014-02-16 NOTE — Patient Instructions (Signed)

## 2014-02-16 NOTE — Telephone Encounter (Signed)
Left message to confirm appointment for 02/03

## 2014-02-16 NOTE — Progress Notes (Signed)
[facility] called in for pt for 2 units of blood.

## 2014-02-16 NOTE — Progress Notes (Addendum)
[facility] [phone]   [phone]  OFFICE PROGRESS NOTE  [name], [place] [address]  DIAGNOSIS: Stage IV ( T2b, N2, M1b) non-small cell lung cancer, adenocarcinoma with areas of squamous differentiation with negative EGFR mutation and negative ALK gene translocation, presented with left lower lobe lung mass in addition to mediastinal lymphadenopathy and brain metastases diagnosed in October of 2014  PRIOR THERAPY: 1) Stereotactic radiotherapy to 3 brain lesions under the care of Dr. Lisbeth Renshaw completed 12/08/2012. 2) Palliative radiotherapy to the left lower lobe lung mass under the care of Dr. Lisbeth Renshaw expected to be completed 01/01/2013. 3) Systemic chemotherapy with carboplatin for AUC of 5 and Alimta 500 mg/M2 every 3 weeks. First dose 12/23/2012. Status post 6 cycles  CURRENT THERAPY: Maintenance chemotherapy with single agent Alimta at 500 mg per meter squared given every 3 weeks. First cycle expected to be given 05/12/2013. Status post 13 cycles.   CHEMOTHERAPY INTENT: Palliative  CURRENT # OF CHEMOTHERAPY CYCLES: 13  CURRENT ANTIEMETICS:Zofran, dexamethasone and Compazine  CURRENT SMOKING STATUS: former smoker  ORAL CHEMOTHERAPY AND CONSENT: None  CURRENT BISPHOSPHONATES USE: None  PAIN MANAGEMENT: 0/10  NARCOTICS INDUCED CONSTIPATION: None  LIVING WILL AND CODE STATUS: ?  INTERVAL HISTORY: [name] 81 y.o. male returns to the clinic today for follow up visit accompanied by his wife and daughter. He has been on maintenance chemotherapy with single agent Alimta for the last 13 cycles. He tolerated the last cycle of his treatment fairly well except for mild fatigue. Today he complains of increased fatigue and some shortness of breath. He also complains of sinus congestion and cough productive of yellow secretions. He denied fever or chills. He requests refill for his dexamethasone. He denies any  bleeding. The patient denied having any nausea or vomiting. He denied having any fever or chills. He has no chest pain,or hemoptysis. He has no weight loss or night sweats. He denied having any significant back pain.   MEDICAL HISTORY: Past Medical History  Diagnosis Date  . COPD (chronic obstructive pulmonary disease)   . Allergy     allergic rhinitis  . Hx of radiation therapy 12/09/12-01/01/13    lung 37.5Gy  . nscl ca w/ brain mets dx'd 08/2012    Patient has a lung mass which is being evaluated    ALLERGIES:  is allergic to asa and sulfa antibiotics.  MEDICATIONS:  Current Outpatient Prescriptions  Medication Sig Dispense Refill  . acetaminophen (TYLENOL) 500 MG tablet Take 500 mg by mouth every 6 (six) hours as needed for mild pain.     . clobetasol cream (TEMOVATE) 3.70 % Apply 1 application topically daily as needed (for itching).    Marland Kitchen dexamethasone (DECADRON) 4 MG tablet 4 mg by mouth twice a day the day before, day of and day after the chemotherapy every 3 weeks 40 tablet 1  . ferrous sulfate (IRON SUPPLEMENT) 325 (65 FE) MG tablet Take 325 mg by mouth daily with breakfast.    . folic acid (FOLVITE) 488 MCG tablet Take 400 mcg by mouth every morning.    . nystatin (MYCOSTATIN) 100000 UNIT/ML suspension Take 5 mLs by mouth 4 (four) times daily.    Marland Kitchen PEMEtrexed Disodium (ALIMTA IV) Inject into the vein every 21 ( twenty-one) days.    Vladimir Faster Glycol-Propyl Glycol (SYSTANE OP) Place 1 drop into both eyes daily.    . simvastatin (ZOCOR) 10 MG tablet Take 10  mg by mouth at bedtime.    Marland Kitchen azithromycin (ZITHROMAX Z-PAK) 250 MG tablet Take 2 tablets by mouth on day one, then take 1 tablet by mouth daily until completed 6 each 0   No current facility-administered medications for this visit.   Facility-Administered Medications Ordered in Other Visits  Medication Dose Route Frequency Provider Last Rate Last Dose  . heparin lock flush 100 unit/mL  500 Units Intracatheter Daily PRN  Josean Lycan E Cleaven Demario, PA-C      . sodium chloride 0.9 % injection 10 mL  10 mL Intracatheter PRN Carlton Adam, PA-C        SURGICAL HISTORY:  Past Surgical History  Procedure Laterality Date  . Cholecystectomy      REVIEW OF SYSTEMS:  Constitutional: positive for fatigue Eyes: negative Ears, nose, mouth, throat, and face: positive for nasal congestion and Sinus congestion with yellow nasal secretions Respiratory: positive for cough and dyspnea on exertion Cardiovascular: negative Gastrointestinal: negative Genitourinary:negative Integument/breast: negative Hematologic/lymphatic: negative Musculoskeletal:negative Neurological: negative Behavioral/Psych: negative Endocrine: negative Allergic/Immunologic: negative   PHYSICAL EXAMINATION: General appearance: alert, cooperative and no distress Head: Normocephalic, without obvious abnormality, atraumatic Neck: no adenopathy, no JVD, supple, symmetrical, trachea midline and thyroid not enlarged, symmetric, no tenderness/mass/nodules Lymph nodes: Cervical, supraclavicular, and axillary nodes normal. Resp: clear to auscultation bilaterally Back: symmetric, no curvature. ROM normal. No CVA tenderness. Cardio: regular rate and rhythm, S1, S2 normal, no murmur, click, rub or gallop GI: soft, non-tender; bowel sounds normal; no masses,  no organomegaly Extremities: edema 2+ Neurologic: Alert and oriented X 3, normal strength and tone. Normal symmetric reflexes. Normal coordination and gait  ECOG PERFORMANCE STATUS: 1 - Symptomatic but completely ambulatory  Blood pressure 122/69, pulse 103, temperature 97.8 F (36.6 C), temperature source Oral, resp. rate 18, height $RemoveBe'5\' 11"'eUgAJPVpE$  (1.803 m), weight 158 lb 6.4 oz (71.85 kg), SpO2 99 %.  LABORATORY DATA: Lab Results  Component Value Date   WBC 2.9* 02/16/2014   HGB 7.7* 02/16/2014   HCT 22.8* 02/16/2014   MCV 91.6 02/16/2014   PLT 173 02/16/2014      Chemistry      Component Value  Date/Time   NA 138 02/16/2014 1000   NA 138 01/16/2014 0928   NA CANCELED 10/13/2012 1149   K 3.5 02/16/2014 1000   K 3.1* 01/16/2014 0928   CL 103 01/16/2014 0928   CO2 25 02/16/2014 1000   CO2 26 01/16/2014 0928   BUN 14.5 02/16/2014 1000   BUN 17 01/16/2014 0928   BUN CANCELED 10/13/2012 1149   CREATININE 1.3 02/16/2014 1000   CREATININE 1.17 01/16/2014 0928      Component Value Date/Time   CALCIUM 8.0* 02/16/2014 1000   CALCIUM 8.1* 01/16/2014 0928   ALKPHOS 287* 02/16/2014 1000   ALKPHOS 247* 01/16/2014 0928   AST 41* 02/16/2014 1000   AST 40* 01/16/2014 0928   ALT 35 02/16/2014 1000   ALT 30 01/16/2014 0928   BILITOT 0.43 02/16/2014 1000   BILITOT 1.1 01/16/2014 0928       RADIOGRAPHIC STUDIES: Dg Chest 2 View  02/04/2014   CLINICAL DATA:  Shortness of breath and increased fatigue, history of lung carcinoma  EXAM: CHEST  2 VIEW  COMPARISON:  01/22/2014  FINDINGS: Right chest wall port is again seen and stable. There trans perihilar scarring again identified stable from the prior exam. Chronic interstitial changes are noted throughout the left lung. The known cavitary lesion in the right lower lobe is again visualized and  stable. It is adjacent spiculated lesion is not well appreciated on this exam. A moderate right-sided pleural effusion is again noted and stable.  IMPRESSION: No significant interval change from the prior exam.  Stable changes in the right lung consistent with the given clinical history.  Stable right-sided effusion.   Electronically Signed   By: Inez Catalina M.D.   On: 02/04/2014 16:35   Ct Chest W Contrast  01/22/2014   CLINICAL DATA:  Non-small cell lung cancer with metastatic disease to the brain. Ongoing chemotherapy. COPD. Radiation therapy.  EXAM: CT CHEST, ABDOMEN, AND PELVIS WITH CONTRAST  TECHNIQUE: Multidetector CT imaging of the chest, abdomen and pelvis was performed following the standard protocol during bolus administration of intravenous  contrast.  CONTRAST:  168mL OMNIPAQUE IOHEXOL 300 MG/ML  SOLN  COMPARISON:  Multiple exams, including 11/20/2013  FINDINGS: CT CHEST FINDINGS  Small to moderate right pleural effusion common nonspecific for transudative versus exudative etiology. I do not see an obvious pleural mass.  Atherosclerotic aortic arch, branch vessel column and coronary arteries. Stable pericardial effusion measuring up to 1.6 cm in thickness along the cardiac apex. No obvious enhancing lesion along the pericardium.  Cavitary right lower lobe infrahilar mass 4.8 by 2.7 cm on image 33 of series 4, essentially stable. Surrounding interstitial accentuation noted. Underlying emphysema is present with peripheral fibrosis in the lungs. Nodular density adjacent to the right inferior pulmonary ligament 1 cm in diameter on image 42 of series 4, stable.  New subtle superior endplate compression fracture at T10, as shown on image 69 of series 603. 4 mm loss of vertebral body height. No significant bony retropulsion.  CT ABDOMEN AND PELVIS FINDINGS  Hepatobiliary: Cholecystectomy.  Pancreas: Unremarkable  Spleen: Unremarkable  Adrenals/Urinary Tract: Punctate 2 mm right kidney lower pole nonobstructive calculus.  Stomach/Bowel: Scattered sigmoid colon diverticula.  Vascular/Lymphatic: Aortoiliac atherosclerotic vascular disease.  Reproductive: Mildly prominent prostate gland, 5.5 by 3.7 cm.  Other: No supplemental non-categorized findings.  Musculoskeletal: Stable appearance of degenerative grade 1 anterolisthesis at L5-S1 with facet arthropathy likely causing foraminal impingement at L5-S1 on the right, and at L4-5 on the left.  IMPRESSION: 1. Stable appearance of the dominant cavitary right lower lobe infrahilar mass and adjacent satellite lesion. Similar appearance of right pleural and pericardial effusion. 2. New mild superior endplate compression fracture at T10, without significant bony retropulsion. 3. Stable nonobstructive right  nephrolithiasis. 4. Sigmoid colon diverticulosis. 5. Lower lumbar spondylosis and degenerative disc disease.   Electronically Signed   By: Sherryl Barters M.D.   On: 01/22/2014 14:30   Ct Abdomen Pelvis W Contrast  01/22/2014   CLINICAL DATA:  Non-small cell lung cancer with metastatic disease to the brain. Ongoing chemotherapy. COPD. Radiation therapy.  EXAM: CT CHEST, ABDOMEN, AND PELVIS WITH CONTRAST  TECHNIQUE: Multidetector CT imaging of the chest, abdomen and pelvis was performed following the standard protocol during bolus administration of intravenous contrast.  CONTRAST:  146mL OMNIPAQUE IOHEXOL 300 MG/ML  SOLN  COMPARISON:  Multiple exams, including 11/20/2013  FINDINGS: CT CHEST FINDINGS  Small to moderate right pleural effusion common nonspecific for transudative versus exudative etiology. I do not see an obvious pleural mass.  Atherosclerotic aortic arch, branch vessel column and coronary arteries. Stable pericardial effusion measuring up to 1.6 cm in thickness along the cardiac apex. No obvious enhancing lesion along the pericardium.  Cavitary right lower lobe infrahilar mass 4.8 by 2.7 cm on image 33 of series 4, essentially stable. Surrounding interstitial accentuation noted. Underlying  emphysema is present with peripheral fibrosis in the lungs. Nodular density adjacent to the right inferior pulmonary ligament 1 cm in diameter on image 42 of series 4, stable.  New subtle superior endplate compression fracture at T10, as shown on image 69 of series 603. 4 mm loss of vertebral body height. No significant bony retropulsion.  CT ABDOMEN AND PELVIS FINDINGS  Hepatobiliary: Cholecystectomy.  Pancreas: Unremarkable  Spleen: Unremarkable  Adrenals/Urinary Tract: Punctate 2 mm right kidney lower pole nonobstructive calculus.  Stomach/Bowel: Scattered sigmoid colon diverticula.  Vascular/Lymphatic: Aortoiliac atherosclerotic vascular disease.  Reproductive: Mildly prominent prostate gland, 5.5 by 3.7 cm.   Other: No supplemental non-categorized findings.  Musculoskeletal: Stable appearance of degenerative grade 1 anterolisthesis at L5-S1 with facet arthropathy likely causing foraminal impingement at L5-S1 on the right, and at L4-5 on the left.  IMPRESSION: 1. Stable appearance of the dominant cavitary right lower lobe infrahilar mass and adjacent satellite lesion. Similar appearance of right pleural and pericardial effusion. 2. New mild superior endplate compression fracture at T10, without significant bony retropulsion. 3. Stable nonobstructive right nephrolithiasis. 4. Sigmoid colon diverticulosis. 5. Lower lumbar spondylosis and degenerative disc disease.   Electronically Signed   By: Sherryl Barters M.D.   On: 01/22/2014 14:30   ASSESSMENT AND PLAN: this is a very pleasant 81 years old white male with metastatic non-small cell lung cancer, adenocarcinoma with brain metastasis status post stereotactic radiotherapy to the brain lesions followed by palliative radiotherapy to the left lower lobe lung mass. The patient is currently undergoing systemic chemotherapy with carboplatin and Alimta status post 6 cycles with stable disease and he is currently undergoing maintenance chemotherapy with single agent Alimta status post 13 cycles. He is tolerating the treatment well except for mild fatigue secondary to chemotherapy-induced anemia  The recent CT scan of the chest, abdomen and pelvis showed no evidence for disease progression. He has compression fracture of the T10 but no significant pain. Review of his labs today revealed a hemoglobin of 7.7. Patient was discussed with and also seen by Dr. Julien Nordmann. We will postpone cycle #14 of his maintenance chemotherapy with single agent Alimta by one week. Today we will arrange to transfuse him 2 units of packed red blood cells to address the chemotherapy induced anemia. For suspected sinusitis/bronchitis a prescription for Z-Pak was sent to his pharmacy of record as well  as a refill for his dexamethasone. He'll follow-up in one week for cycle 14 of his maintenance chemotherapy with single agent Alimta and again in 4 weeks prior to the start of cycle #15.  He was advised to call immediately if he has any concerning symptoms in the interval. The patient voices understanding of current disease status and treatment options and is in agreement with the current care plan.  All questions were answered. The patient knows to call the clinic with any problems, questions or concerns. We can certainly see the patient much sooner if necessary.  Carlton Adam, PA-C 02/16/2014  ADDENDUM: Hematology/Oncology Attending: I had a face to face encounter with the patient. I recommended his care plan. This is a very pleasant 81 years old white male with a stage IV non-small cell lung cancer status post induction chemotherapy with carboplatin and Alimta and currently undergoing maintenance chemotherapy with single agent Alimta status post 13 cycles. The patient is feeling fine today with no specific complaints except for questionable bronchitis and fatigue secondary to chemotherapy-induced anemia. We will start the patient on the back for his bronchitis. Will  also arrange for the patient to receive 2 units of PRBCs transfusion this week. I will delay the start of cycle #14 until next week. He would come back for follow-up visit in 4 weeks for reevaluation and management of any adverse effect of his treatment before starting cycle #15. He was advised to call immediately if he has any concerning symptoms in the interval..   Disclaimer: This note was dictated with voice recognition software. Similar sounding words can inadvertently be transcribed and may not be corrected upon review. Eilleen Kempf., MD 02/16/2014

## 2014-02-17 ENCOUNTER — Ambulatory Visit (HOSPITAL_BASED_OUTPATIENT_CLINIC_OR_DEPARTMENT_OTHER): Payer: Medicare Other

## 2014-02-17 ENCOUNTER — Telehealth: Payer: Self-pay | Admitting: *Deleted

## 2014-02-17 VITALS — BP 106/62 | HR 90 | Temp 97.1°F | Resp 18

## 2014-02-17 DIAGNOSIS — Z87891 Personal history of nicotine dependence: Secondary | ICD-10-CM | POA: Diagnosis not present

## 2014-02-17 DIAGNOSIS — C3431 Malignant neoplasm of lower lobe, right bronchus or lung: Secondary | ICD-10-CM

## 2014-02-17 DIAGNOSIS — D6481 Anemia due to antineoplastic chemotherapy: Secondary | ICD-10-CM | POA: Diagnosis not present

## 2014-02-17 DIAGNOSIS — Z923 Personal history of irradiation: Secondary | ICD-10-CM | POA: Diagnosis not present

## 2014-02-17 DIAGNOSIS — T451X5A Adverse effect of antineoplastic and immunosuppressive drugs, initial encounter: Secondary | ICD-10-CM | POA: Diagnosis not present

## 2014-02-17 DIAGNOSIS — C3432 Malignant neoplasm of lower lobe, left bronchus or lung: Secondary | ICD-10-CM | POA: Diagnosis not present

## 2014-02-17 DIAGNOSIS — C7931 Secondary malignant neoplasm of brain: Secondary | ICD-10-CM | POA: Diagnosis not present

## 2014-02-17 DIAGNOSIS — J449 Chronic obstructive pulmonary disease, unspecified: Secondary | ICD-10-CM | POA: Diagnosis not present

## 2014-02-17 MED ORDER — ACETAMINOPHEN 325 MG PO TABS
650.0000 mg | ORAL_TABLET | Freq: Once | ORAL | Status: AC
  Administered 2014-02-17: 650 mg via ORAL

## 2014-02-17 MED ORDER — DIPHENHYDRAMINE HCL 25 MG PO CAPS
ORAL_CAPSULE | ORAL | Status: AC
  Filled 2014-02-17: qty 1

## 2014-02-17 MED ORDER — DIPHENHYDRAMINE HCL 25 MG PO CAPS
25.0000 mg | ORAL_CAPSULE | Freq: Once | ORAL | Status: AC
  Administered 2014-02-17: 25 mg via ORAL

## 2014-02-17 MED ORDER — SODIUM CHLORIDE 0.9 % IV SOLN
Freq: Once | INTRAVENOUS | Status: AC
  Administered 2014-02-17: 15:00:00 via INTRAVENOUS

## 2014-02-17 MED ORDER — HEPARIN SOD (PORK) LOCK FLUSH 100 UNIT/ML IV SOLN
500.0000 [IU] | Freq: Once | INTRAVENOUS | Status: AC
  Administered 2014-02-17: 500 [IU] via INTRAVENOUS
  Filled 2014-02-17: qty 5

## 2014-02-17 MED ORDER — ACETAMINOPHEN 325 MG PO TABS
ORAL_TABLET | ORAL | Status: AC
  Filled 2014-02-17: qty 2

## 2014-02-17 MED ORDER — SODIUM CHLORIDE 0.9 % IJ SOLN
10.0000 mL | INTRAMUSCULAR | Status: DC | PRN
  Administered 2014-02-17: 10 mL via INTRAVENOUS
  Filled 2014-02-17: qty 10

## 2014-02-17 NOTE — Patient Instructions (Signed)

## 2014-02-17 NOTE — Telephone Encounter (Signed)
Per staff message and POF I have scheduled appts. Advised scheduler of appts. JMW  

## 2014-02-18 ENCOUNTER — Telehealth: Payer: Self-pay | Admitting: Internal Medicine

## 2014-02-18 LAB — TYPE AND SCREEN
ABO/RH(D): O POS
Antibody Screen: NEGATIVE
UNIT DIVISION: 0
Unit division: 0

## 2014-02-18 NOTE — Telephone Encounter (Signed)
Left message to confirm lab appointment for 02/09

## 2014-02-22 ENCOUNTER — Other Ambulatory Visit: Payer: Self-pay | Admitting: *Deleted

## 2014-02-23 ENCOUNTER — Ambulatory Visit (HOSPITAL_BASED_OUTPATIENT_CLINIC_OR_DEPARTMENT_OTHER): Payer: Medicare Other

## 2014-02-23 ENCOUNTER — Other Ambulatory Visit (HOSPITAL_BASED_OUTPATIENT_CLINIC_OR_DEPARTMENT_OTHER): Payer: Medicare Other

## 2014-02-23 ENCOUNTER — Telehealth: Payer: Self-pay | Admitting: Internal Medicine

## 2014-02-23 ENCOUNTER — Ambulatory Visit: Payer: Medicare Other

## 2014-02-23 DIAGNOSIS — C7931 Secondary malignant neoplasm of brain: Secondary | ICD-10-CM | POA: Diagnosis not present

## 2014-02-23 DIAGNOSIS — C3490 Malignant neoplasm of unspecified part of unspecified bronchus or lung: Secondary | ICD-10-CM

## 2014-02-23 DIAGNOSIS — Z5111 Encounter for antineoplastic chemotherapy: Secondary | ICD-10-CM | POA: Diagnosis not present

## 2014-02-23 DIAGNOSIS — C3431 Malignant neoplasm of lower lobe, right bronchus or lung: Secondary | ICD-10-CM

## 2014-02-23 DIAGNOSIS — Z95828 Presence of other vascular implants and grafts: Secondary | ICD-10-CM

## 2014-02-23 LAB — CBC WITH DIFFERENTIAL/PLATELET
BASO%: 0.1 % (ref 0.0–2.0)
BASOS ABS: 0 10*3/uL (ref 0.0–0.1)
EOS%: 0 % (ref 0.0–7.0)
Eosinophils Absolute: 0 10*3/uL (ref 0.0–0.5)
HEMATOCRIT: 28.2 % — AB (ref 38.4–49.9)
HEMOGLOBIN: 9.9 g/dL — AB (ref 13.0–17.1)
LYMPH#: 0.5 10*3/uL — AB (ref 0.9–3.3)
LYMPH%: 7.4 % — ABNORMAL LOW (ref 14.0–49.0)
MCH: 32.5 pg (ref 27.2–33.4)
MCHC: 35.1 g/dL (ref 32.0–36.0)
MCV: 92.5 fL (ref 79.3–98.0)
MONO#: 0.8 10*3/uL (ref 0.1–0.9)
MONO%: 12.5 % (ref 0.0–14.0)
NEUT#: 5.4 10*3/uL (ref 1.5–6.5)
NEUT%: 80 % — ABNORMAL HIGH (ref 39.0–75.0)
Platelets: 119 10*3/uL — ABNORMAL LOW (ref 140–400)
RBC: 3.05 10*6/uL — ABNORMAL LOW (ref 4.20–5.82)
RDW: 16.9 % — AB (ref 11.0–14.6)
WBC: 6.7 10*3/uL (ref 4.0–10.3)

## 2014-02-23 LAB — COMPREHENSIVE METABOLIC PANEL (CC13)
ALT: 30 U/L (ref 0–55)
AST: 34 U/L (ref 5–34)
Albumin: 2.6 g/dL — ABNORMAL LOW (ref 3.5–5.0)
Alkaline Phosphatase: 292 U/L — ABNORMAL HIGH (ref 40–150)
Anion Gap: 10 mEq/L (ref 3–11)
BUN: 13.8 mg/dL (ref 7.0–26.0)
CO2: 25 meq/L (ref 22–29)
Calcium: 8.6 mg/dL (ref 8.4–10.4)
Chloride: 103 mEq/L (ref 98–109)
Creatinine: 1.2 mg/dL (ref 0.7–1.3)
EGFR: 61 mL/min/{1.73_m2} — ABNORMAL LOW (ref >90)
GLUCOSE: 111 mg/dL (ref 70–140)
Potassium: 3.5 mEq/L (ref 3.5–5.1)
SODIUM: 137 meq/L (ref 136–145)
Total Bilirubin: 0.53 mg/dL (ref 0.20–1.20)
Total Protein: 6 g/dL — ABNORMAL LOW (ref 6.4–8.3)

## 2014-02-23 MED ORDER — HEPARIN SOD (PORK) LOCK FLUSH 100 UNIT/ML IV SOLN
500.0000 [IU] | Freq: Once | INTRAVENOUS | Status: AC | PRN
  Administered 2014-02-23: 500 [IU]
  Filled 2014-02-23: qty 5

## 2014-02-23 MED ORDER — FAMOTIDINE IN NACL 20-0.9 MG/50ML-% IV SOLN
INTRAVENOUS | Status: AC
  Filled 2014-02-23: qty 50

## 2014-02-23 MED ORDER — SODIUM CHLORIDE 0.9 % IJ SOLN
10.0000 mL | INTRAMUSCULAR | Status: DC | PRN
  Administered 2014-02-23: 10 mL
  Filled 2014-02-23: qty 10

## 2014-02-23 MED ORDER — ONDANSETRON 8 MG/50ML IVPB (CHCC)
8.0000 mg | Freq: Once | INTRAVENOUS | Status: AC
  Administered 2014-02-23: 8 mg via INTRAVENOUS

## 2014-02-23 MED ORDER — DEXAMETHASONE SODIUM PHOSPHATE 10 MG/ML IJ SOLN
10.0000 mg | Freq: Once | INTRAMUSCULAR | Status: AC
  Administered 2014-02-23: 10 mg via INTRAVENOUS

## 2014-02-23 MED ORDER — SODIUM CHLORIDE 0.9 % IV SOLN
Freq: Once | INTRAVENOUS | Status: AC
  Administered 2014-02-23: 13:00:00 via INTRAVENOUS

## 2014-02-23 MED ORDER — DEXAMETHASONE SODIUM PHOSPHATE 10 MG/ML IJ SOLN
INTRAMUSCULAR | Status: AC
  Filled 2014-02-23: qty 1

## 2014-02-23 MED ORDER — ONDANSETRON 8 MG/NS 50 ML IVPB
INTRAVENOUS | Status: AC
  Filled 2014-02-23: qty 8

## 2014-02-23 MED ORDER — PEMETREXED DISODIUM CHEMO INJECTION 500 MG
503.0000 mg/m2 | Freq: Once | INTRAVENOUS | Status: AC
  Administered 2014-02-23: 1000 mg via INTRAVENOUS
  Filled 2014-02-23: qty 40

## 2014-02-23 MED ORDER — FAMOTIDINE IN NACL 20-0.9 MG/50ML-% IV SOLN
20.0000 mg | Freq: Once | INTRAVENOUS | Status: AC
  Administered 2014-02-23: 20 mg via INTRAVENOUS

## 2014-02-23 MED ORDER — SODIUM CHLORIDE 0.9 % IJ SOLN
10.0000 mL | INTRAMUSCULAR | Status: DC | PRN
  Administered 2014-02-23: 10 mL via INTRAVENOUS
  Filled 2014-02-23: qty 10

## 2014-02-23 NOTE — Patient Instructions (Signed)
[facility] Discharge Instructions for Patients Receiving Chemotherapy  Today you received the following chemotherapy agents Alimta  To help prevent nausea and vomiting after your treatment, we encourage you to take your nausea medication as prescribed.   If you develop nausea and vomiting that is not controlled by your nausea medication, call the clinic.   BELOW ARE SYMPTOMS THAT SHOULD BE REPORTED IMMEDIATELY:  *FEVER GREATER THAN 100.5 F  *CHILLS WITH OR WITHOUT FEVER  NAUSEA AND VOMITING THAT IS NOT CONTROLLED WITH YOUR NAUSEA MEDICATION  *UNUSUAL SHORTNESS OF BREATH  *UNUSUAL BRUISING OR BLEEDING  TENDERNESS IN MOUTH AND THROAT WITH OR WITHOUT PRESENCE OF ULCERS  *URINARY PROBLEMS  *BOWEL PROBLEMS  UNUSUAL RASH Items with * indicate a potential emergency and should be followed up as soon as possible.  Feel free to call the clinic you have any questions or concerns. The clinic phone number is [phone].

## 2014-02-23 NOTE — Patient Instructions (Signed)

## 2014-02-23 NOTE — Telephone Encounter (Signed)
Pt confirmed labs/ov per 02/02 POF, gave pt updated schedule deleted 02/23 schedule moved to 03/01 added flush.... Cherylann Banas

## 2014-03-04 ENCOUNTER — Emergency Department (HOSPITAL_COMMUNITY): Payer: Medicare Other

## 2014-03-04 ENCOUNTER — Other Ambulatory Visit: Payer: Self-pay

## 2014-03-04 ENCOUNTER — Encounter (HOSPITAL_COMMUNITY): Payer: Self-pay | Admitting: Emergency Medicine

## 2014-03-04 ENCOUNTER — Inpatient Hospital Stay (HOSPITAL_COMMUNITY)
Admission: EM | Admit: 2014-03-04 | Discharge: 2014-03-11 | DRG: 180 | Disposition: A | Discharge status: Home / Self Care | Payer: Medicare Other | Attending: Internal Medicine | Admitting: Internal Medicine

## 2014-03-04 DIAGNOSIS — T451X5A Adverse effect of antineoplastic and immunosuppressive drugs, initial encounter: Secondary | ICD-10-CM | POA: Diagnosis present

## 2014-03-04 DIAGNOSIS — D61810 Antineoplastic chemotherapy induced pancytopenia: Secondary | ICD-10-CM | POA: Diagnosis present

## 2014-03-04 DIAGNOSIS — Z79899 Other long term (current) drug therapy: Secondary | ICD-10-CM

## 2014-03-04 DIAGNOSIS — R7989 Other specified abnormal findings of blood chemistry: Secondary | ICD-10-CM

## 2014-03-04 DIAGNOSIS — R0602 Shortness of breath: Secondary | ICD-10-CM | POA: Diagnosis present

## 2014-03-04 DIAGNOSIS — Z87891 Personal history of nicotine dependence: Secondary | ICD-10-CM | POA: Diagnosis not present

## 2014-03-04 DIAGNOSIS — J910 Malignant pleural effusion: Secondary | ICD-10-CM | POA: Diagnosis not present

## 2014-03-04 DIAGNOSIS — J90 Pleural effusion, not elsewhere classified: Secondary | ICD-10-CM

## 2014-03-04 DIAGNOSIS — C7801 Secondary malignant neoplasm of right lung: Secondary | ICD-10-CM | POA: Diagnosis not present

## 2014-03-04 DIAGNOSIS — C3431 Malignant neoplasm of lower lobe, right bronchus or lung: Principal | ICD-10-CM | POA: Diagnosis present

## 2014-03-04 DIAGNOSIS — E876 Hypokalemia: Secondary | ICD-10-CM | POA: Diagnosis present

## 2014-03-04 DIAGNOSIS — C7931 Secondary malignant neoplasm of brain: Secondary | ICD-10-CM | POA: Diagnosis present

## 2014-03-04 DIAGNOSIS — E46 Unspecified protein-calorie malnutrition: Secondary | ICD-10-CM | POA: Diagnosis not present

## 2014-03-04 DIAGNOSIS — J449 Chronic obstructive pulmonary disease, unspecified: Secondary | ICD-10-CM | POA: Diagnosis not present

## 2014-03-04 DIAGNOSIS — Z803 Family history of malignant neoplasm of breast: Secondary | ICD-10-CM

## 2014-03-04 DIAGNOSIS — Z9221 Personal history of antineoplastic chemotherapy: Secondary | ICD-10-CM

## 2014-03-04 DIAGNOSIS — Z6822 Body mass index (BMI) 22.0-22.9, adult: Secondary | ICD-10-CM | POA: Diagnosis not present

## 2014-03-04 DIAGNOSIS — Z823 Family history of stroke: Secondary | ICD-10-CM | POA: Diagnosis not present

## 2014-03-04 DIAGNOSIS — J948 Other specified pleural conditions: Secondary | ICD-10-CM

## 2014-03-04 DIAGNOSIS — R791 Abnormal coagulation profile: Secondary | ICD-10-CM | POA: Diagnosis not present

## 2014-03-04 DIAGNOSIS — Z923 Personal history of irradiation: Secondary | ICD-10-CM

## 2014-03-04 DIAGNOSIS — I313 Pericardial effusion (noninflammatory): Secondary | ICD-10-CM | POA: Diagnosis present

## 2014-03-04 DIAGNOSIS — C3490 Malignant neoplasm of unspecified part of unspecified bronchus or lung: Secondary | ICD-10-CM | POA: Diagnosis not present

## 2014-03-04 DIAGNOSIS — R0600 Dyspnea, unspecified: Secondary | ICD-10-CM | POA: Diagnosis not present

## 2014-03-04 DIAGNOSIS — D701 Agranulocytosis secondary to cancer chemotherapy: Secondary | ICD-10-CM

## 2014-03-04 LAB — CBC WITH DIFFERENTIAL/PLATELET
BASOS PCT: 0 % (ref 0–1)
Basophils Absolute: 0 10*3/uL (ref 0.0–0.1)
Eosinophils Absolute: 0.1 10*3/uL (ref 0.0–0.7)
Eosinophils Relative: 5 % (ref 0–5)
HEMATOCRIT: 25.3 % — AB (ref 39.0–52.0)
Hemoglobin: 8.8 g/dL — ABNORMAL LOW (ref 13.0–17.0)
LYMPHS PCT: 17 % (ref 12–46)
Lymphs Abs: 0.3 10*3/uL — ABNORMAL LOW (ref 0.7–4.0)
MCH: 32.6 pg (ref 26.0–34.0)
MCHC: 34.8 g/dL (ref 30.0–36.0)
MCV: 93.7 fL (ref 78.0–100.0)
MONOS PCT: 3 % (ref 3–12)
Monocytes Absolute: 0.1 10*3/uL (ref 0.1–1.0)
NEUTROS ABS: 1.2 10*3/uL — AB (ref 1.7–7.7)
Neutrophils Relative %: 75 % (ref 43–77)
Platelets: 34 10*3/uL — ABNORMAL LOW (ref 150–400)
RBC: 2.7 MIL/uL — ABNORMAL LOW (ref 4.22–5.81)
RDW: 16.8 % — ABNORMAL HIGH (ref 11.5–15.5)
WBC: 1.7 10*3/uL — AB (ref 4.0–10.5)

## 2014-03-04 LAB — COMPREHENSIVE METABOLIC PANEL
ALT: 35 U/L (ref 0–53)
AST: 39 U/L — ABNORMAL HIGH (ref 0–37)
Albumin: 2.9 g/dL — ABNORMAL LOW (ref 3.5–5.2)
Alkaline Phosphatase: 218 U/L — ABNORMAL HIGH (ref 39–117)
Anion gap: 8 (ref 5–15)
BUN: 28 mg/dL — ABNORMAL HIGH (ref 6–23)
CALCIUM: 8.4 mg/dL (ref 8.4–10.5)
CO2: 24 mmol/L (ref 19–32)
CREATININE: 1.47 mg/dL — AB (ref 0.50–1.35)
Chloride: 102 mmol/L (ref 96–112)
GFR calc non Af Amer: 44 mL/min — ABNORMAL LOW (ref >90)
GFR, EST AFRICAN AMERICAN: 51 mL/min — AB (ref >90)
GLUCOSE: 111 mg/dL — AB (ref 70–99)
Potassium: 3.2 mmol/L — ABNORMAL LOW (ref 3.5–5.1)
Sodium: 134 mmol/L — ABNORMAL LOW (ref 135–145)
TOTAL PROTEIN: 6.7 g/dL (ref 6.0–8.3)
Total Bilirubin: 1 mg/dL (ref 0.3–1.2)

## 2014-03-04 LAB — D-DIMER, QUANTITATIVE: D-Dimer, Quant: 2.21 ug/mL-FEU — ABNORMAL HIGH (ref 0.00–0.48)

## 2014-03-04 MED ORDER — POTASSIUM CHLORIDE CRYS ER 20 MEQ PO TBCR
40.0000 meq | EXTENDED_RELEASE_TABLET | Freq: Once | ORAL | Status: AC
  Administered 2014-03-04: 40 meq via ORAL
  Filled 2014-03-04: qty 2

## 2014-03-04 MED ORDER — MORPHINE SULFATE 2 MG/ML IJ SOLN: 2.0000 mg | INTRAMUSCULAR | Status: DC | PRN

## 2014-03-04 MED ORDER — SIMVASTATIN 10 MG PO TABS
10.0000 mg | ORAL_TABLET | Freq: Every day | ORAL | Status: DC
  Administered 2014-03-04 – 2014-03-10 (×7): 10 mg via ORAL
  Filled 2014-03-04 (×8): qty 1

## 2014-03-04 MED ORDER — ONDANSETRON HCL 4 MG PO TABS: 4.0000 mg | ORAL_TABLET | Freq: Four times a day (QID) | ORAL | Status: DC | PRN

## 2014-03-04 MED ORDER — ONDANSETRON HCL 4 MG/2ML IJ SOLN: 4.0000 mg | Freq: Four times a day (QID) | INTRAMUSCULAR | Status: DC | PRN

## 2014-03-04 MED ORDER — IOHEXOL 350 MG/ML SOLN
100.0000 mL | Freq: Once | INTRAVENOUS | Status: AC | PRN
  Administered 2014-03-04: 100 mL via INTRAVENOUS

## 2014-03-04 MED ORDER — FERROUS SULFATE 325 (65 FE) MG PO TABS
325.0000 mg | ORAL_TABLET | Freq: Every day | ORAL | Status: DC
  Administered 2014-03-05 – 2014-03-06 (×2): 325 mg via ORAL
  Filled 2014-03-04 (×8): qty 1

## 2014-03-04 MED ORDER — FOLIC ACID 400 MCG PO TABS: 400.0000 ug | ORAL_TABLET | ORAL | Status: DC

## 2014-03-04 MED ORDER — SODIUM CHLORIDE 0.9 % IV BOLUS (SEPSIS)
1000.0000 mL | Freq: Once | INTRAVENOUS | Status: AC
  Administered 2014-03-04: 1000 mL via INTRAVENOUS

## 2014-03-04 MED ORDER — HEPARIN SODIUM (PORCINE) 5000 UNIT/ML IJ SOLN: 5000.0000 [IU] | Freq: Three times a day (TID) | INTRAMUSCULAR | Status: DC

## 2014-03-04 MED ORDER — FOLIC ACID 0.5 MG HALF TAB
0.5000 mg | ORAL_TABLET | Freq: Every day | ORAL | Status: DC
  Administered 2014-03-05 – 2014-03-11 (×6): 0.5 mg via ORAL
  Filled 2014-03-04 (×7): qty 1

## 2014-03-04 NOTE — ED Notes (Signed)
Patient transported to X-ray 

## 2014-03-04 NOTE — H&P (Signed)
[facility] History and Physical  JAYDRIAN CORPENING ZOX:[id number] DOB: 12-25-1933 DOA: 03/04/2014  Referring physician: Emergency Department PCP: [name], MD  Specialists: Dr. Julien Nordmann   Chief Complaint: SOB  HPI: [name] is a 81 y.o. male  With a hx of metastatic lung cancer followed by Dr. Julien Nordmann and hx of recurrent symptomatic chemo related anemia who presents to the ED with complaints of worsening sob on exertion with dizziness upon standing. In the ED, pt was noted to have normal o2 sats on RA but with elevated d-dimer with mild sinus tachycardia. CTA was done which demonstrated a moderate sized R pleural effusion as well as pericardial effusion. Hgb was noted to be just under 9. The hospitalist service was consulted for consideration for admission.  On further questioning, pt first noted some sob and dizziness since 11/15 but attributed to chemo treatments.  Review of Systems: Review of Systems  Constitutional: Positive for malaise/fatigue. Negative for diaphoresis.  HENT: Negative for hearing loss.   Eyes: Negative for blurred vision and pain.  Respiratory: Positive for shortness of breath. Negative for cough and wheezing.   Cardiovascular: Negative for chest pain, palpitations and orthopnea.  Gastrointestinal: Negative for nausea, vomiting and abdominal pain.  Genitourinary: Negative for dysuria and flank pain.  Musculoskeletal: Negative for neck pain.  Skin: Negative for itching.  Neurological: Negative for loss of consciousness and weakness.  Psychiatric/Behavioral: Negative for hallucinations and substance abuse. The patient is not nervous/anxious.      Past Medical History  Diagnosis Date  . COPD (chronic obstructive pulmonary disease)   . Allergy     allergic rhinitis  . Hx of radiation therapy 12/09/12-01/01/13    lung 37.5Gy  . nscl ca w/ brain mets dx'd 08/2012    Patient has a lung mass which is being evaluated   Past Surgical History  Procedure  Laterality Date  . Cholecystectomy     Social History:  reports that he quit smoking about 15 years ago. He has never used smokeless tobacco. He reports that he does not drink alcohol or use illicit drugs.  where does patient live--home, ALF, SNF? and with whom if at home?  Can patient participate in ADLs?  Allergies  Allergen Reactions  . Asa [Aspirin] Nausea Only  . Sulfa Antibiotics Nausea And Vomiting    Family History  Problem Relation Age of Onset  . Cancer Mother     breast  . Stroke Father     (be sure to complete)  Prior to Admission medications   Medication Sig Start Date End Date Taking? Authorizing Provider  acetaminophen (TYLENOL) 500 MG tablet Take 500 mg by mouth every 6 (six) hours as needed for mild pain.    Yes Historical Provider, MD  Alum & Mag Hydroxide-Simeth (MAGIC MOUTHWASH) SOLN Take 5 mLs by mouth 3 (three) times daily as needed for mouth pain.   Yes Historical Provider, MD  clobetasol cream (TEMOVATE) 8.11 % Apply 1 application topically daily as needed (for itching).   Yes Historical Provider, MD  dexamethasone (DECADRON) 4 MG tablet 4 mg by mouth twice a day the day before, day of and day after the chemotherapy every 3 weeks 02/16/14  Yes Adrena E Johnson, PA-C  ferrous sulfate (IRON SUPPLEMENT) 325 (65 FE) MG tablet Take 325 mg by mouth daily with breakfast.   Yes Historical Provider, MD  folic acid (FOLVITE) 914 MCG tablet Take 400 mcg by mouth every morning.   Yes Historical Provider, MD  guaiFENesin (  MUCINEX) 600 MG 12 hr tablet Take 600 mg by mouth at bedtime as needed for cough.   Yes Historical Provider, MD  PEMEtrexed Disodium (ALIMTA IV) Inject into the vein every 21 ( twenty-one) days.   Yes Historical Provider, MD  Polyethyl Glycol-Propyl Glycol (SYSTANE OP) Place 1 drop into both eyes daily.   Yes Historical Provider, MD  simvastatin (ZOCOR) 10 MG tablet Take 10 mg by mouth at bedtime.   Yes Historical Provider, MD  azithromycin (ZITHROMAX Z-PAK)  250 MG tablet Take 2 tablets by mouth on day one, then take 1 tablet by mouth daily until completed Patient not taking: Reported on 03/04/2014 02/16/14   Carlton Adam, PA-C   Physical Exam: Filed Vitals:   03/04/14 1135 03/04/14 1340 03/04/14 1348 03/04/14 1647  BP:  103/61 117/76 115/58  Pulse: 105 101 102 112  Temp:   98.2 F (36.8 C)   TempSrc:      Resp: 19 18 20 20   SpO2: 100% 99% 98% 99%     General:  Awake, in nad  Eyes: PERRL B  ENT: membranes moist, dentition fair  Neck: trachea midline, neck supple  Cardiovascular: regular s1, s2  Respiratory: normal resp effort, no wheezing, good air movement  Abdomen: soft,nondistended  Skin: perfused, no clubbing  Musculoskeletal: perfused, no clubbing  Psychiatric: mood/affect normal//no auditory/visual hallucinations  Neurologic: cn2-12 grossly intact, strength/sensation intact  Labs on Admission:  Basic Metabolic Panel:  Recent Labs Lab 03/04/14 1215  NA 134*  K 3.2*  CL 102  CO2 24  GLUCOSE 111*  BUN 28*  CREATININE 1.47*  CALCIUM 8.4   Liver Function Tests:  Recent Labs Lab 03/04/14 1215  AST 39*  ALT 35  ALKPHOS 218*  BILITOT 1.0  PROT 6.7  ALBUMIN 2.9*   No results for input(s): LIPASE, AMYLASE in the last 168 hours. No results for input(s): AMMONIA in the last 168 hours. CBC:  Recent Labs Lab 03/04/14 1215  WBC 1.7*  NEUTROABS 1.2*  HGB 8.8*  HCT 25.3*  MCV 93.7  PLT 34*   Cardiac Enzymes: No results for input(s): CKTOTAL, CKMB, CKMBINDEX, TROPONINI in the last 168 hours.  BNP (last 3 results) No results for input(s): BNP in the last 8760 hours.  ProBNP (last 3 results) No results for input(s): PROBNP in the last 8760 hours.  CBG: No results for input(s): GLUCAP in the last 168 hours.  Radiological Exams on Admission: Dg Chest 2 View  03/04/2014   CLINICAL DATA:  Known lung cancer with shortness of Breath  EXAM: CHEST  2 VIEW  COMPARISON:  02/04/2014  FINDINGS: Cardiac  shadow is stable. A right-sided chest wall port is again seen and stable. Stable changes in the right hilum extending toward is a pleura are noted similar to that seen on the prior exam. Diffuse interstitial changes are noted throughout the left lung and stable. A stable right-sided pleural effusion is noted as well. No new focal abnormality is seen.  IMPRESSION: Chronic changes without acute abnormality.   Electronically Signed   By: Inez Catalina M.D.   On: 03/04/2014 12:25   Ct Angio Chest Pe W/cm &/or Wo Cm  03/04/2014   CLINICAL DATA:  Shortness of Breath.  Lung carcinoma.  Anemia  EXAM: CT ANGIOGRAPHY CHEST WITH CONTRAST  TECHNIQUE: Multidetector CT imaging of the chest was performed using the standard protocol during bolus administration of intravenous contrast. Multiplanar CT image reconstructions and MIPs were obtained to evaluate the vascular anatomy.  CONTRAST:  162mL OMNIPAQUE IOHEXOL 350 MG/ML SOLN  COMPARISON:  Chest radiograph March 04, 2014 and chest CT January 22, 2014  FINDINGS: There is no demonstrable pulmonary embolus. There is no thoracic aortic aneurysm or dissection. There is atherosclerotic change in the aorta.  There is a moderate pleural effusion on the right with right base consolidation. There is underlying emphysema with areas of peripheral pulmonary fibrosis, stable. There is a lobulated mass in superior segment of the right lower lobe measuring 1.8 x 1.7 cm, slightly larger than on the previous study. There is a partially cavitary mass in the superior segment right lower lobe more medially measuring 4.8 by 2.7 cm, stable.  There is a moderate pericardial effusion. There is left ventricular hypertrophy. There is an enlarged sub- carinal lymph node measuring 2.4 x1.2 cm, stable. There is adenopathy in the right hilum, indistinguishable from the right lower lobe partially cavitated mass. No new lymph node prominence is seen.  In the visualized upper abdomen, there is hepatic  steatosis.  There are no blastic or lytic bone lesions. Thyroid appears unremarkable.  Review of the MIP images confirms the above findings.  IMPRESSION: No demonstrable pulmonary embolus. Moderate right effusion. Partially cavitary mass right lower lobe, stable. A smaller mass also in the superior segment right lower lobe has become slightly larger compared to the previous examination, currently measuring 1.8 x 1.7 cm. There is underlying interstitial fibrosis and emphysema. Stable lymph node prominence the right hilar and subcarinal regions.   Electronically Signed   By: Lowella Grip III M.D.   On: 03/04/2014 15:59    Assessment/Plan Principal Problem:   Pleural effusion on right Active Problems:   Primary cancer of right lower lobe of lung   Brain metastasis   Dyspnea   SOB (shortness of breath)   1. SOB likely secondary to symptomatic R pleural effusion 1. Reviewed CT chest history. Pt noted to have pleural effusion since 11/15 that seems worse now 2. Suspect malignant effusion given primary lung CA 3. Currently afebrile and not toxic appearing, thus hold off on empiric abx for the time being 4. Will request IR guided R sided thoracentesis 5. Will obtain fluid analysis 6. Admit to med-surg 2. Primary metastatic lung cancer 1. Followed by Dr. Julien Nordmann as an outpatient 2. Currently undergoing tx 3. Dizziness 1. Suspect secondary to pleural effusion above 2. Stable at present 4. Tachycardia 1. Likely secondary to pleural effusion above 5. Chronic anemia with pancytopenia 1. Likely secondary to active chemotx 2. Hgb stable 3. Monitor for now 6. Hypokalemia 1. Will replace 7. DVT prophylaxis 1. SCD's  Code Status: Full. Confirmed with patient in presence of family Family Communication: Pt in room, family at bedside  Disposition Plan: pending (indicate anticipated LOS)   Ozil Stettler, [facility] Pager [phone]  If 7PM-7AM, please contact  night-coverage [email or web address] Password [facility] 03/04/2014, 5:37 PM

## 2014-03-04 NOTE — ED Notes (Signed)
Patient currently receiving chemotherapy treatment since Nov 2014. Increasingly SOB x2 days. Has had to have multiple blood transfusions since Christmas. Last blood transfusion was Feb 4th or 5th. Endorses dizziness upon standing. Denies chest pain. Wife reports Tmax 100.4 F on Monday. RR even/unlabored. Speaking full/clear sentences.

## 2014-03-04 NOTE — ED Notes (Signed)
Lab delay, pt in [place]

## 2014-03-04 NOTE — ED Notes (Signed)
Report given to RN 3rd fl

## 2014-03-04 NOTE — ED Notes (Signed)
[place] called, state thoracentesis will occur tomorrow.

## 2014-03-04 NOTE — ED Provider Notes (Signed)
CSN: [id number]     Arrival date & time 03/04/14  1116 History   First MD Initiated Contact with Patient 03/04/14 1123     Chief Complaint  Patient presents with  . Shortness of Breath  . Chemo Card      HPI Pt presents with increasing exertional SOB over the past week. No SOB at rest. Denies CP. Denies orthopnea. No hx of CHF. No hx of reactive airway disease. Currently receiving chemotherapy for metastatic lung CA. Former smoker. Reports low grade fever 4 days ago, none sense then. Denies productive cough. No unilateral leg swelling. No hx of PE or DVT. Hx of blood transfusions. Pt is concerned his hemoglobin could be low again   Past Medical History  Diagnosis Date  . COPD (chronic obstructive pulmonary disease)   . Allergy     allergic rhinitis  . Hx of radiation therapy 12/09/12-01/01/13    lung 37.5Gy  . nscl ca w/ brain mets dx'd 08/2012    Patient has a lung mass which is being evaluated   Past Surgical History  Procedure Laterality Date  . Cholecystectomy     Family History  Problem Relation Age of Onset  . Cancer Mother     breast  . Stroke Father    History  Substance Use Topics  . Smoking status: Former Smoker    Quit date: 09/11/1998  . Smokeless tobacco: Never Used  . Alcohol Use: No    Review of Systems  All other systems reviewed and are negative.     Allergies  Asa and Sulfa antibiotics  Home Medications   Prior to Admission medications   Medication Sig Start Date End Date Taking? Authorizing Provider  acetaminophen (TYLENOL) 500 MG tablet Take 500 mg by mouth every 6 (six) hours as needed for mild pain.    Yes Historical Provider, MD  Alum & Mag Hydroxide-Simeth (MAGIC MOUTHWASH) SOLN Take 5 mLs by mouth 3 (three) times daily as needed for mouth pain.   Yes Historical Provider, MD  clobetasol cream (TEMOVATE) 2.53 % Apply 1 application topically daily as needed (for itching).   Yes Historical Provider, MD  dexamethasone (DECADRON) 4 MG tablet  4 mg by mouth twice a day the day before, day of and day after the chemotherapy every 3 weeks 02/16/14  Yes Adrena E Johnson, PA-C  ferrous sulfate (IRON SUPPLEMENT) 325 (65 FE) MG tablet Take 325 mg by mouth daily with breakfast.   Yes Historical Provider, MD  folic acid (FOLVITE) 664 MCG tablet Take 400 mcg by mouth every morning.   Yes Historical Provider, MD  guaiFENesin (MUCINEX) 600 MG 12 hr tablet Take 600 mg by mouth at bedtime as needed for cough.   Yes Historical Provider, MD  PEMEtrexed Disodium (ALIMTA IV) Inject into the vein every 21 ( twenty-one) days.   Yes Historical Provider, MD  Polyethyl Glycol-Propyl Glycol (SYSTANE OP) Place 1 drop into both eyes daily.   Yes Historical Provider, MD  simvastatin (ZOCOR) 10 MG tablet Take 10 mg by mouth at bedtime.   Yes Historical Provider, MD  azithromycin (ZITHROMAX Z-PAK) 250 MG tablet Take 2 tablets by mouth on day one, then take 1 tablet by mouth daily until completed Patient not taking: Reported on 03/04/2014 02/16/14   Adrena E Johnson, PA-C   BP 117/76 mmHg  Pulse 102  Temp(Src) 98.2 F (36.8 C) (Oral)  Resp 20  SpO2 98% Physical Exam  Constitutional: He is oriented to person, place, and time. He  appears well-developed and well-nourished.  HENT:  Head: Normocephalic and atraumatic.  Eyes: EOM are normal.  Neck: Normal range of motion.  Cardiovascular: Normal rate, regular rhythm, normal heart sounds and intact distal pulses.   Pulmonary/Chest: Effort normal and breath sounds normal. No respiratory distress.  Abdominal: Soft. He exhibits no distension. There is no tenderness.  Musculoskeletal: Normal range of motion.  Neurological: He is alert and oriented to person, place, and time.  Skin: Skin is warm and dry.  Psychiatric: He has a normal mood and affect. Judgment normal.  Nursing note and vitals reviewed.   ED Course  Procedures (including critical care time) Labs Review Labs Reviewed  CBC WITH DIFFERENTIAL/PLATELET -  Abnormal; Notable for the following:    WBC 1.7 (*)    RBC 2.70 (*)    Hemoglobin 8.8 (*)    HCT 25.3 (*)    RDW 16.8 (*)    Platelets 34 (*)    Neutro Abs 1.2 (*)    Lymphs Abs 0.3 (*)    All other components within normal limits  COMPREHENSIVE METABOLIC PANEL - Abnormal; Notable for the following:    Sodium 134 (*)    Potassium 3.2 (*)    Glucose, Bld 111 (*)    BUN 28 (*)    Creatinine, Ser 1.47 (*)    Albumin 2.9 (*)    AST 39 (*)    Alkaline Phosphatase 218 (*)    GFR calc non Af Amer 44 (*)    GFR calc Af Amer 51 (*)    All other components within normal limits  D-DIMER, QUANTITATIVE - Abnormal; Notable for the following:    D-Dimer, Quant 2.21 (*)    All other components within normal limits  TYPE AND SCREEN   HEMOGLOBIN  Date Value Ref Range Status  03/04/2014 8.8* 13.0 - 17.0 g/dL Final    Comment:    REPEATED TO VERIFY  01/18/2014 11.0* 13.0 - 17.0 g/dL Final  01/17/2014 10.5* 13.0 - 17.0 g/dL Final  01/16/2014 9.9* 13.0 - 17.0 g/dL Final  10/13/2012 13.7* 14.1 - 18.1 g/dL Final  09/08/2012 13.1* 14.1 - 18.1 g/dL Final   HGB  Date Value Ref Range Status  02/23/2014 9.9* 13.0 - 17.1 g/dL Final  02/16/2014 7.7* 13.0 - 17.1 g/dL Final  02/04/2014 9.6* 13.0 - 17.1 g/dL Final  01/26/2014 10.3* 13.0 - 17.1 g/dL Final      Imaging Review Dg Chest 2 View  03/04/2014   CLINICAL DATA:  Known lung cancer with shortness of Breath  EXAM: CHEST  2 VIEW  COMPARISON:  02/04/2014  FINDINGS: Cardiac shadow is stable. A right-sided chest wall port is again seen and stable. Stable changes in the right hilum extending toward is a pleura are noted similar to that seen on the prior exam. Diffuse interstitial changes are noted throughout the left lung and stable. A stable right-sided pleural effusion is noted as well. No new focal abnormality is seen.  IMPRESSION: Chronic changes without acute abnormality.   Electronically Signed   By: Inez Catalina M.D.   On: 03/04/2014 12:25    Ct Angio Chest Pe W/cm &/or Wo Cm  03/04/2014   CLINICAL DATA:  Shortness of Breath.  Lung carcinoma.  Anemia  EXAM: CT ANGIOGRAPHY CHEST WITH CONTRAST  TECHNIQUE: Multidetector CT imaging of the chest was performed using the standard protocol during bolus administration of intravenous contrast. Multiplanar CT image reconstructions and MIPs were obtained to evaluate the vascular anatomy.  CONTRAST:  181mL OMNIPAQUE IOHEXOL 350 MG/ML SOLN  COMPARISON:  Chest radiograph March 04, 2014 and chest CT January 22, 2014  FINDINGS: There is no demonstrable pulmonary embolus. There is no thoracic aortic aneurysm or dissection. There is atherosclerotic change in the aorta.  There is a moderate pleural effusion on the right with right base consolidation. There is underlying emphysema with areas of peripheral pulmonary fibrosis, stable. There is a lobulated mass in superior segment of the right lower lobe measuring 1.8 x 1.7 cm, slightly larger than on the previous study. There is a partially cavitary mass in the superior segment right lower lobe more medially measuring 4.8 by 2.7 cm, stable.  There is a moderate pericardial effusion. There is left ventricular hypertrophy. There is an enlarged sub- carinal lymph node measuring 2.4 x1.2 cm, stable. There is adenopathy in the right hilum, indistinguishable from the right lower lobe partially cavitated mass. No new lymph node prominence is seen.  In the visualized upper abdomen, there is hepatic steatosis.  There are no blastic or lytic bone lesions. Thyroid appears unremarkable.  Review of the MIP images confirms the above findings.  IMPRESSION: No demonstrable pulmonary embolus. Moderate right effusion. Partially cavitary mass right lower lobe, stable. A smaller mass also in the superior segment right lower lobe has become slightly larger compared to the previous examination, currently measuring 1.8 x 1.7 cm. There is underlying interstitial fibrosis and emphysema. Stable  lymph node prominence the right hilar and subcarinal regions.   Electronically Signed   By: Lowella Grip III M.D.   On: 03/04/2014 15:59  I personally reviewed the imaging tests through PACS system I reviewed available ER/hospitalization records through the EMR    EKG Interpretation   Date/Time:  Thursday March 04 2014 11:34:52 EST Ventricular Rate:  96 PR Interval:  164 QRS Duration: 87 QT Interval:  349 QTC Calculation: 441 R Axis:   18 Text Interpretation:  Sinus rhythm Low voltage, extremity leads Baseline  wander in lead(s) V1 V2 No significant change was found Confirmed by  Kinslie Hove  MD, Enas Winchel ([id number]) on 03/04/2014 11:52:29 AM      MDM   Final diagnoses:  Positive D dimer  SOB (shortness of breath)    Increasing moderate size right pleural effusion. Pt will benefit from thoracentesis to improve symptoms. No alternative cause for SOB at this time    Hoy Morn, MD 03/04/14 [phone]

## 2014-03-04 NOTE — ED Notes (Signed)
MD at bedside. 

## 2014-03-04 NOTE — ED Notes (Signed)
Attempt made to call report to RN 3rd fl,RN not available, awaiting call back

## 2014-03-05 LAB — COMPREHENSIVE METABOLIC PANEL
ALT: 28 U/L (ref 0–53)
AST: 31 U/L (ref 0–37)
Albumin: 2.3 g/dL — ABNORMAL LOW (ref 3.5–5.2)
Alkaline Phosphatase: 175 U/L — ABNORMAL HIGH (ref 39–117)
Anion gap: 4 — ABNORMAL LOW (ref 5–15)
BUN: 24 mg/dL — AB (ref 6–23)
CHLORIDE: 105 mmol/L (ref 96–112)
CO2: 26 mmol/L (ref 19–32)
CREATININE: 1.37 mg/dL — AB (ref 0.50–1.35)
Calcium: 8.1 mg/dL — ABNORMAL LOW (ref 8.4–10.5)
GFR calc non Af Amer: 48 mL/min — ABNORMAL LOW (ref >90)
GFR, EST AFRICAN AMERICAN: 55 mL/min — AB (ref >90)
Glucose, Bld: 118 mg/dL — ABNORMAL HIGH (ref 70–99)
Potassium: 3.9 mmol/L (ref 3.5–5.1)
Sodium: 135 mmol/L (ref 135–145)
Total Bilirubin: 1.1 mg/dL (ref 0.3–1.2)
Total Protein: 5.3 g/dL — ABNORMAL LOW (ref 6.0–8.3)

## 2014-03-05 LAB — CBC
HCT: 21 % — ABNORMAL LOW (ref 39.0–52.0)
HEMATOCRIT: 20.4 % — AB (ref 39.0–52.0)
HEMOGLOBIN: 7.2 g/dL — AB (ref 13.0–17.0)
Hemoglobin: 7.2 g/dL — ABNORMAL LOW (ref 13.0–17.0)
MCH: 31.9 pg (ref 26.0–34.0)
MCH: 32.4 pg (ref 26.0–34.0)
MCHC: 34.3 g/dL (ref 30.0–36.0)
MCHC: 35.3 g/dL (ref 30.0–36.0)
MCV: 92.9 fL (ref 78.0–100.0)
MCV: 91.9 fL (ref 78.0–100.0)
Platelets: 23 10*3/uL — CL (ref 150–400)
Platelets: 18 10*3/uL — CL (ref 150–400)
RBC: 2.26 MIL/uL — AB (ref 4.22–5.81)
RBC: 2.22 MIL/uL — AB (ref 4.22–5.81)
RDW: 16.8 % — ABNORMAL HIGH (ref 11.5–15.5)
RDW: 16.9 % — ABNORMAL HIGH (ref 11.5–15.5)
WBC: 1.6 10*3/uL — ABNORMAL LOW (ref 4.0–10.5)
WBC: 1.8 10*3/uL — AB (ref 4.0–10.5)

## 2014-03-05 LAB — LACTATE DEHYDROGENASE: LDH: 177 U/L (ref 94–250)

## 2014-03-05 MED ORDER — SODIUM CHLORIDE 0.9 % IJ SOLN
INTRAMUSCULAR | Status: AC
  Administered 2014-03-05: 15:00:00
  Filled 2014-03-05: qty 10

## 2014-03-05 MED ORDER — SODIUM CHLORIDE 0.9 % IV SOLN
Freq: Once | INTRAVENOUS | Status: AC
  Administered 2014-03-05: 15:00:00 via INTRAVENOUS

## 2014-03-05 MED ORDER — ENSURE COMPLETE PO LIQD
237.0000 mL | Freq: Three times a day (TID) | ORAL | Status: DC
  Administered 2014-03-05 – 2014-03-11 (×11): 237 mL via ORAL

## 2014-03-05 MED ORDER — SODIUM CHLORIDE 0.9 % IV SOLN
Freq: Once | INTRAVENOUS | Status: AC
  Administered 2014-03-05: 11:00:00 via INTRAVENOUS

## 2014-03-05 NOTE — Progress Notes (Signed)
UR completed 

## 2014-03-05 NOTE — Progress Notes (Signed)
CRITICAL VALUE ALERT  Critical value received: PLT  23  Date of notification:  03/05/14  Time of notification: 1173  Critical value read back:YES  Nurse who received alert:Eston Heslin  Liberty Handy ,RN  MD notified (1st page): Kathline Magic ,NP  Time of first page:  0450  MD notified (2nd page):  Time of second page:  Responding MD: Fredirick Maudlin  ,NP  Time MD responded:  0500

## 2014-03-05 NOTE — Progress Notes (Signed)
INITIAL NUTRITION ASSESSMENT  DOCUMENTATION CODES Per approved criteria  -Not Applicable   INTERVENTION: - Ensure Complete po TID, each supplement provides 350 kcal and 13 grams of protein - RD will continue to monitor  NUTRITION DIAGNOSIS: Inadequate oral intake related to metastatic cancer as evidenced by reported poor po and wt loss.   Goal: Pt to meet >/= 90% of their estimated nutrition needs   Monitor:  Weight trend, po intake, acceptance of supplements, labs  Reason for Assessment: Malnutrition Screening Tool  81 y.o. male  Admitting Dx: Pleural effusion on right  ASSESSMENT: 81 y.o. male  With a hx of metastatic lung cancer followed by Dr. Julien Nordmann and hx of recurrent symptomatic chemo related anemia who presents to the ED with complaints of worsening sob on exertion with dizziness upon standing.   Pt reports a 20 lb wt loss in unknown time frame.  He reports a poor appetite recently. He ate breakfast this morning that consisted of eggs, potatoes, and bacon.  Pt with mild muscle wasting of the temples and clavicle.  Pt drinks Boost supplements at home occasionally when he feels like he isn't eating well. Advised pt to drink Boost or Ensure at home BID. Pt provided with coupons to purchase Ensure. Will order nutritional supplements while in the hospital. - Labs reviewed  Height: Ht Readings from Last 1 Encounters:  02/16/14 5\' 11"  (1.803 m)    Weight: Wt Readings from Last 1 Encounters:  02/16/14 158 lb 6.4 oz (71.85 kg)    Ideal Body Weight: 75.3 kg  % Ideal Body Weight: 95%  Wt Readings from Last 10 Encounters:  02/16/14 158 lb 6.4 oz (71.85 kg)  02/04/14 156 lb 1.6 oz (70.806 kg)  01/26/14 159 lb 8 oz (72.349 kg)  01/15/14 152 lb 1.9 oz (69 kg)  01/05/14 161 lb 3.2 oz (73.12 kg)  12/16/13 160 lb (72.576 kg)  12/15/13 160 lb 6.4 oz (72.757 kg)  11/24/13 163 lb 11.2 oz (74.254 kg)  10/27/13 166 lb 8 oz (75.524 kg)  10/12/13 160 lb 9.6 oz (72.848 kg)     Usual Body Weight: 178-190 lbs  % Usual Body Weight: 89%  BMI:  There is no weight on file to calculate BMI.  Estimated Nutritional Needs: Kcal: 2000-2200 Protein: 100-115 g Fluid: 2.0 L/day  Skin: Intact  Diet Order: Diet regular  EDUCATION NEEDS: -Education needs addressed   Intake/Output Summary (Last 24 hours) at 03/05/14 1410 Last data filed at 03/05/14 1239  Gross per 24 hour  Intake    570 ml  Output   1600 ml  Net  -1030 ml    Last BM: prior to admission   Labs:   Recent Labs Lab 03/04/14 1215 03/05/14 0409  NA 134* 135  K 3.2* 3.9  CL 102 105  CO2 24 26  BUN 28* 24*  CREATININE 1.47* 1.37*  CALCIUM 8.4 8.1*  GLUCOSE 111* 118*    CBG (last 3)  No results for input(s): GLUCAP in the last 72 hours.  Scheduled Meds: . ferrous sulfate  325 mg Oral Q breakfast  . folic acid  0.5 mg Oral Daily  . simvastatin  10 mg Oral QHS    Continuous Infusions:   Past Medical History  Diagnosis Date  . COPD (chronic obstructive pulmonary disease)   . Allergy     allergic rhinitis  . Hx of radiation therapy 12/09/12-01/01/13    lung 37.5Gy  . nscl ca w/ brain mets dx'd 08/2012  Patient has a lung mass which is being evaluated    Past Surgical History  Procedure Laterality Date  . Cholecystectomy      Laurette Schimke [place], [place], [place]

## 2014-03-05 NOTE — Progress Notes (Signed)
[facility] PROGRESS NOTE  [name]:[id number] DOB: 01-25-1933 DOA: 03/04/2014 PCP: [name], MD  Assessment/Plan:  Principal Problem:   Pleural effusion on right - Platelet count below 50,000 as such will plan on transfusing. After patient has been transfused we'll plan on thoracentesis.  Active Problems:   Primary cancer of right lower lobe of lung/Brain metastasis - Patient to continue routine follow-up with oncologist after discharge - Most likely cause of pleural effusion listed above    Dyspnea - Most likely secondary to lung carcinoma as well as pleural effusion    SOB (shortness of breath) - Patient breathing on room air comfortably suspect that with activity he gets more short of breath most likely secondary to pleural effusion listed above  Code Status: Full Family Communication: No family at bedside Disposition Plan: Pending improvement in condition   Consultants:  None  Procedures:  None  Antibiotics:  None  HPI/Subjective: Patient has no new complaints. No acute issues reported overnight  Objective: Filed Vitals:   03/05/14 1115  BP: 98/70  Pulse: 113  Temp: 98.3 F (36.8 C)  Resp: 18    Intake/Output Summary (Last 24 hours) at 03/05/14 1551 Last data filed at 03/05/14 1239  Gross per 24 hour  Intake    570 ml  Output   1600 ml  Net  -1030 ml   There were no vitals filed for this visit.  Exam:   General:  Patient in no acute distress, alert and awake  Cardiovascular: Regular rate and rhythm, no murmurs or rubs  Respiratory: Rales right greater than left, no wheezes, equal chest rise, decreased breath sounds at right lower base  Abdomen: Soft, nondistended, nontender  Musculoskeletal: No cyanosis or clubbing   Data Reviewed: Basic Metabolic Panel:  Recent Labs Lab 03/04/14 1215 03/05/14 0409  NA 134* 135  K 3.2* 3.9  CL 102 105  CO2 24 26  GLUCOSE 111* 118*  BUN 28* 24*  CREATININE 1.47* 1.37*   CALCIUM 8.4 8.1*   Liver Function Tests:  Recent Labs Lab 03/04/14 1215 03/05/14 0409  AST 39* 31  ALT 35 28  ALKPHOS 218* 175*  BILITOT 1.0 1.1  PROT 6.7 5.3*  ALBUMIN 2.9* 2.3*   No results for input(s): LIPASE, AMYLASE in the last 168 hours. No results for input(s): AMMONIA in the last 168 hours. CBC:  Recent Labs Lab 03/04/14 1215 03/05/14 0409 03/05/14 1327  WBC 1.7* 1.6* 1.8*  NEUTROABS 1.2*  --   --   HGB 8.8* 7.2* 7.2*  HCT 25.3* 21.0* 20.4*  MCV 93.7 92.9 91.9  PLT 34* 23* 18*   Cardiac Enzymes: No results for input(s): CKTOTAL, CKMB, CKMBINDEX, TROPONINI in the last 168 hours. BNP (last 3 results) No results for input(s): BNP in the last 8760 hours.  ProBNP (last 3 results) No results for input(s): PROBNP in the last 8760 hours.  CBG: No results for input(s): GLUCAP in the last 168 hours.  No results found for this or any previous visit (from the past 240 hour(s)).   Studies: Dg Chest 2 View  03/04/2014   CLINICAL DATA:  Known lung cancer with shortness of Breath  EXAM: CHEST  2 VIEW  COMPARISON:  02/04/2014  FINDINGS: Cardiac shadow is stable. A right-sided chest wall port is again seen and stable. Stable changes in the right hilum extending toward is a pleura are noted similar to that seen on the prior exam. Diffuse interstitial changes are noted throughout the left lung  and stable. A stable right-sided pleural effusion is noted as well. No new focal abnormality is seen.  IMPRESSION: Chronic changes without acute abnormality.   Electronically Signed   By: Inez Catalina M.D.   On: 03/04/2014 12:25   Ct Angio Chest Pe W/cm &/or Wo Cm  03/04/2014   CLINICAL DATA:  Shortness of Breath.  Lung carcinoma.  Anemia  EXAM: CT ANGIOGRAPHY CHEST WITH CONTRAST  TECHNIQUE: Multidetector CT imaging of the chest was performed using the standard protocol during bolus administration of intravenous contrast. Multiplanar CT image reconstructions and MIPs were obtained to  evaluate the vascular anatomy.  CONTRAST:  169mL OMNIPAQUE IOHEXOL 350 MG/ML SOLN  COMPARISON:  Chest radiograph March 04, 2014 and chest CT January 22, 2014  FINDINGS: There is no demonstrable pulmonary embolus. There is no thoracic aortic aneurysm or dissection. There is atherosclerotic change in the aorta.  There is a moderate pleural effusion on the right with right base consolidation. There is underlying emphysema with areas of peripheral pulmonary fibrosis, stable. There is a lobulated mass in superior segment of the right lower lobe measuring 1.8 x 1.7 cm, slightly larger than on the previous study. There is a partially cavitary mass in the superior segment right lower lobe more medially measuring 4.8 by 2.7 cm, stable.  There is a moderate pericardial effusion. There is left ventricular hypertrophy. There is an enlarged sub- carinal lymph node measuring 2.4 x1.2 cm, stable. There is adenopathy in the right hilum, indistinguishable from the right lower lobe partially cavitated mass. No new lymph node prominence is seen.  In the visualized upper abdomen, there is hepatic steatosis.  There are no blastic or lytic bone lesions. Thyroid appears unremarkable.  Review of the MIP images confirms the above findings.  IMPRESSION: No demonstrable pulmonary embolus. Moderate right effusion. Partially cavitary mass right lower lobe, stable. A smaller mass also in the superior segment right lower lobe has become slightly larger compared to the previous examination, currently measuring 1.8 x 1.7 cm. There is underlying interstitial fibrosis and emphysema. Stable lymph node prominence the right hilar and subcarinal regions.   Electronically Signed   By: Lowella Grip III M.D.   On: 03/04/2014 15:59    Scheduled Meds: . sodium chloride   Intravenous Once  . feeding supplement (ENSURE COMPLETE)  237 mL Oral TID BM  . ferrous sulfate  325 mg Oral Q breakfast  . folic acid  0.5 mg Oral Daily  . simvastatin  10 mg  Oral QHS  . sodium chloride       Continuous Infusions:    Time spent: > 35 minutes    Velvet Bathe  [facility] Pager [phone]. If 7PM-7AM, please contact night-coverage at [email or web address], password [facility] 03/05/2014, 3:51 PM

## 2014-03-06 ENCOUNTER — Observation Stay (HOSPITAL_COMMUNITY): Payer: Medicare Other

## 2014-03-06 DIAGNOSIS — Z85118 Personal history of other malignant neoplasm of bronchus and lung: Secondary | ICD-10-CM | POA: Diagnosis not present

## 2014-03-06 DIAGNOSIS — J948 Other specified pleural conditions: Secondary | ICD-10-CM | POA: Diagnosis not present

## 2014-03-06 DIAGNOSIS — J910 Malignant pleural effusion: Secondary | ICD-10-CM | POA: Diagnosis present

## 2014-03-06 DIAGNOSIS — E876 Hypokalemia: Secondary | ICD-10-CM | POA: Diagnosis present

## 2014-03-06 DIAGNOSIS — J90 Pleural effusion, not elsewhere classified: Secondary | ICD-10-CM | POA: Diagnosis not present

## 2014-03-06 DIAGNOSIS — D6959 Other secondary thrombocytopenia: Secondary | ICD-10-CM | POA: Diagnosis not present

## 2014-03-06 DIAGNOSIS — Z6822 Body mass index (BMI) 22.0-22.9, adult: Secondary | ICD-10-CM | POA: Diagnosis not present

## 2014-03-06 DIAGNOSIS — Z803 Family history of malignant neoplasm of breast: Secondary | ICD-10-CM | POA: Diagnosis not present

## 2014-03-06 DIAGNOSIS — Z9221 Personal history of antineoplastic chemotherapy: Secondary | ICD-10-CM | POA: Diagnosis not present

## 2014-03-06 DIAGNOSIS — D6481 Anemia due to antineoplastic chemotherapy: Secondary | ICD-10-CM | POA: Diagnosis not present

## 2014-03-06 DIAGNOSIS — J918 Pleural effusion in other conditions classified elsewhere: Secondary | ICD-10-CM | POA: Diagnosis not present

## 2014-03-06 DIAGNOSIS — J449 Chronic obstructive pulmonary disease, unspecified: Secondary | ICD-10-CM | POA: Diagnosis present

## 2014-03-06 DIAGNOSIS — Z923 Personal history of irradiation: Secondary | ICD-10-CM | POA: Diagnosis not present

## 2014-03-06 DIAGNOSIS — Z79899 Other long term (current) drug therapy: Secondary | ICD-10-CM | POA: Diagnosis not present

## 2014-03-06 DIAGNOSIS — D701 Agranulocytosis secondary to cancer chemotherapy: Secondary | ICD-10-CM | POA: Diagnosis not present

## 2014-03-06 DIAGNOSIS — T451X5A Adverse effect of antineoplastic and immunosuppressive drugs, initial encounter: Secondary | ICD-10-CM | POA: Diagnosis present

## 2014-03-06 DIAGNOSIS — Z87891 Personal history of nicotine dependence: Secondary | ICD-10-CM | POA: Diagnosis not present

## 2014-03-06 DIAGNOSIS — C3431 Malignant neoplasm of lower lobe, right bronchus or lung: Secondary | ICD-10-CM | POA: Diagnosis not present

## 2014-03-06 DIAGNOSIS — Z823 Family history of stroke: Secondary | ICD-10-CM | POA: Diagnosis not present

## 2014-03-06 DIAGNOSIS — E46 Unspecified protein-calorie malnutrition: Secondary | ICD-10-CM | POA: Diagnosis present

## 2014-03-06 DIAGNOSIS — R0600 Dyspnea, unspecified: Secondary | ICD-10-CM | POA: Diagnosis not present

## 2014-03-06 DIAGNOSIS — Z9889 Other specified postprocedural states: Secondary | ICD-10-CM | POA: Diagnosis not present

## 2014-03-06 DIAGNOSIS — I313 Pericardial effusion (noninflammatory): Secondary | ICD-10-CM | POA: Diagnosis present

## 2014-03-06 DIAGNOSIS — C7931 Secondary malignant neoplasm of brain: Secondary | ICD-10-CM | POA: Diagnosis not present

## 2014-03-06 DIAGNOSIS — D61810 Antineoplastic chemotherapy induced pancytopenia: Secondary | ICD-10-CM | POA: Diagnosis present

## 2014-03-06 DIAGNOSIS — R0602 Shortness of breath: Secondary | ICD-10-CM | POA: Diagnosis not present

## 2014-03-06 LAB — CBC
HCT: 18.6 % — ABNORMAL LOW (ref 39.0–52.0)
Hemoglobin: 6.6 g/dL — CL (ref 13.0–17.0)
MCH: 32.5 pg (ref 26.0–34.0)
MCHC: 35.5 g/dL (ref 30.0–36.0)
MCV: 91.6 fL (ref 78.0–100.0)
Platelets: 61 10*3/uL — ABNORMAL LOW (ref 150–400)
RBC: 2.03 MIL/uL — AB (ref 4.22–5.81)
RDW: 16.6 % — AB (ref 11.5–15.5)
WBC: 1.1 10*3/uL — AB (ref 4.0–10.5)

## 2014-03-06 LAB — BODY FLUID CELL COUNT WITH DIFFERENTIAL
Eos, Fluid: 0 %
Lymphs, Fluid: 98 %
Monocyte-Macrophage-Serous Fluid: 1 % — ABNORMAL LOW (ref 50–90)
NEUTROPHIL FLUID: 1 % (ref 0–25)
WBC FLUID: 144 uL (ref 0–1000)

## 2014-03-06 LAB — PROTEIN, BODY FLUID: TOTAL PROTEIN, FLUID: 3.5 g/dL

## 2014-03-06 LAB — LACTATE DEHYDROGENASE, PLEURAL OR PERITONEAL FLUID: LD FL: 167 U/L — AB (ref 3–23)

## 2014-03-06 LAB — PREPARE RBC (CROSSMATCH)

## 2014-03-06 MED ORDER — ACETAMINOPHEN 325 MG PO TABS
650.0000 mg | ORAL_TABLET | Freq: Four times a day (QID) | ORAL | Status: DC | PRN
Start: 2014-03-06 — End: 2014-03-11
  Administered 2014-03-06 – 2014-03-10 (×5): 650 mg via ORAL
  Filled 2014-03-06 (×4): qty 2

## 2014-03-06 MED ORDER — SODIUM CHLORIDE 0.9 % IV SOLN: Freq: Once | INTRAVENOUS | Status: DC

## 2014-03-06 NOTE — Progress Notes (Signed)
[facility] PROGRESS NOTE  [name] ZOX:[id number] DOB: 05/09/1933 DOA: 03/04/2014 PCP: [name], MD  Assessment/Plan:  Principal Problem:   Pleural effusion on right - Patient is status post thoracentesis and currently feeling better. - On discharge will plan on discharging with follow-up with pulmonologist for further evaluation recommendations  Active Problems:   Primary cancer of right lower lobe of lung/Brain metastasis - Patient to continue routine follow-up with oncologist after discharge - Most likely cause of pleural effusion listed above    Dyspnea - Most likely secondary to lung carcinoma as well as pleural effusion    SOB (shortness of breath) - Patient breathing on room air comfortably suspect that with activity he gets more short of breath most likely secondary to pleural effusion listed above  Pancytopenia - Most likely secondary to chemotherapy - Transfuse 1 unit of blood reassess hemoglobin levels next a.m.  Code Status: Full Family Communication: No family at bedside Disposition Plan: Pending improvement in condition   Consultants:  None  Procedures:  None  Antibiotics:  None  HPI/Subjective: Patient has no new complaints. No acute issues reported overnight  Objective: Filed Vitals:   03/06/14 1450  BP: 125/63  Pulse: 104  Temp: 97.6 F (36.4 C)  Resp: 16    Intake/Output Summary (Last 24 hours) at 03/06/14 1820 Last data filed at 03/06/14 1450  Gross per 24 hour  Intake 590.83 ml  Output   1350 ml  Net -759.17 ml   There were no vitals filed for this visit.  Exam:   General:  Patient in no acute distress, alert and awake  Cardiovascular: Regular rate and rhythm, no murmurs or rubs  Respiratory: Rales right greater than left, no wheezes, equal chest rise, decreased breath sounds at right lower base  Abdomen: Soft, nondistended, nontender  Musculoskeletal: No cyanosis or clubbing   Data Reviewed: Basic  Metabolic Panel:  Recent Labs Lab 03/04/14 1215 03/05/14 0409  NA 134* 135  K 3.2* 3.9  CL 102 105  CO2 24 26  GLUCOSE 111* 118*  BUN 28* 24*  CREATININE 1.47* 1.37*  CALCIUM 8.4 8.1*   Liver Function Tests:  Recent Labs Lab 03/04/14 1215 03/05/14 0409  AST 39* 31  ALT 35 28  ALKPHOS 218* 175*  BILITOT 1.0 1.1  PROT 6.7 5.3*  ALBUMIN 2.9* 2.3*   No results for input(s): LIPASE, AMYLASE in the last 168 hours. No results for input(s): AMMONIA in the last 168 hours. CBC:  Recent Labs Lab 03/04/14 1215 03/05/14 0409 03/05/14 1327 03/06/14 0437  WBC 1.7* 1.6* 1.8* 1.1*  NEUTROABS 1.2*  --   --   --   HGB 8.8* 7.2* 7.2* 6.6*  HCT 25.3* 21.0* 20.4* 18.6*  MCV 93.7 92.9 91.9 91.6  PLT 34* 23* 18* 61*   Cardiac Enzymes: No results for input(s): CKTOTAL, CKMB, CKMBINDEX, TROPONINI in the last 168 hours. BNP (last 3 results) No results for input(s): BNP in the last 8760 hours.  ProBNP (last 3 results) No results for input(s): PROBNP in the last 8760 hours.  CBG: No results for input(s): GLUCAP in the last 168 hours.  No results found for this or any previous visit (from the past 240 hour(s)).   Studies: Dg Chest 1 View  03/06/2014   CLINICAL DATA:  Status post right thoracentesis  EXAM: CHEST  1 VIEW  COMPARISON:  03/04/2014  FINDINGS: There is a right chest wall port a catheter with tip in the cavoatrial junction. Normal heart  size. Chronic interstitial coarsening is again identified bilaterally. There has been decrease in volume of the right pleural effusion after thoracentesis. No significant pneumothorax identified.  IMPRESSION: 1. No pneumothorax after right thoracentesis.   Electronically Signed   By: Kerby Moors M.D.   On: 03/06/2014 10:37   US Thoracentesis Asp Pleural Space W/img Guide  03/06/2014   CLINICAL DATA:  Shortness of breath. Hx of right sided lung cancer. Right pleural effusion. Request for diagnostic and therapeutic thoracentesis  EXAM:  ULTRASOUND GUIDED RIGHT THORACENTESIS  COMPARISON:  None  PROCEDURE: An ultrasound guided thoracentesis was thoroughly discussed with the patient and questions answered. The benefits, risks, alternatives and complications were also discussed. The patient understands and wishes to proceed with the procedure. Written consent was obtained.  Ultrasound was performed to localize and mark an adequate pocket of fluid in the right chest. The area was then prepped and draped in the normal sterile fashion. 1% Lidocaine was used for local anesthesia. Under ultrasound guidance a 19 gauge Yueh catheter was introduced. Thoracentesis was performed. The catheter was removed and a dressing applied.  COMPLICATIONS: None immediate  FINDINGS: A total of approximately 630 mL of clear yellow fluid was removed. A fluid sample wassent for laboratory analysis.  IMPRESSION: Successful ultrasound guided right thoracentesis yielding 630 mL of pleural fluid.  Read by Ascencion Dike PA-C   Electronically Signed   By: Jerilynn Mages.  Shick M.D.   On: 03/06/2014 10:12    Scheduled Meds: . sodium chloride   Intravenous Once  . feeding supplement (ENSURE COMPLETE)  237 mL Oral TID BM  . ferrous sulfate  325 mg Oral Q breakfast  . folic acid  0.5 mg Oral Daily  . simvastatin  10 mg Oral QHS   Continuous Infusions:    Time spent: > 35 minutes    Velvet Bathe  [facility] Pager [phone]. If 7PM-7AM, please contact night-coverage at [email or web address], password [facility] 03/06/2014, 6:20 PM  LOS: 0 days

## 2014-03-06 NOTE — Procedures (Signed)
Successful US guided right thoracentesis. Yielded 626mL of clear yellow fluid. Pt tolerated procedure well. No immediate complications.  Specimen was sent for labs. CXR ordered.  Ascencion Dike PA-C 03/06/2014 10:02 AM

## 2014-03-06 NOTE — Progress Notes (Signed)
Utilization Review completed.  

## 2014-03-06 NOTE — Progress Notes (Signed)
CRITICAL VALUE ALERT  Critical value received: HGB 6.6, WBC 1.1  Date of notification: 03/05/14  Time of notification: 6812  Critical value read back:YES  Nurse who received alert:Lanell Carpenter  Liberty Handy, RN  MD notified (1st page):  Fredirick Maudlin, NP  Time of first page: [phone]  MD notified (2nd page):  Time of second page:  Responding MD:T.Rogue Bussing, NP  Time MD responded:  [phone]

## 2014-03-07 ENCOUNTER — Other Ambulatory Visit: Payer: Self-pay | Admitting: Oncology

## 2014-03-07 LAB — PREPARE PLATELET PHERESIS
UNIT DIVISION: 0
Unit division: 0

## 2014-03-07 LAB — TYPE AND SCREEN
ABO/RH(D): O POS
Antibody Screen: NEGATIVE
UNIT DIVISION: 0

## 2014-03-07 LAB — CBC
HEMATOCRIT: 23 % — AB (ref 39.0–52.0)
HEMOGLOBIN: 8.1 g/dL — AB (ref 13.0–17.0)
MCH: 32.1 pg (ref 26.0–34.0)
MCHC: 35.2 g/dL (ref 30.0–36.0)
MCV: 91.3 fL (ref 78.0–100.0)
PLATELETS: 47 10*3/uL — AB (ref 150–400)
RBC: 2.52 MIL/uL — AB (ref 4.22–5.81)
RDW: 16 % — ABNORMAL HIGH (ref 11.5–15.5)
WBC: 0.9 10*3/uL — CL (ref 4.0–10.5)

## 2014-03-07 LAB — PREPARE FRESH FROZEN PLASMA: Unit division: 0

## 2014-03-07 LAB — AMYLASE, PLEURAL FLUID: Amylase, Pleural Fluid: 24 U/L

## 2014-03-07 MED ORDER — CETYLPYRIDINIUM CHLORIDE 0.05 % MT LIQD
7.0000 mL | Freq: Two times a day (BID) | OROMUCOSAL | Status: DC
  Administered 2014-03-08 – 2014-03-11 (×5): 7 mL via OROMUCOSAL

## 2014-03-07 NOTE — Progress Notes (Signed)
[facility] PROGRESS NOTE  [name] QHU:[id number] DOB: 19-Mar-1933 DOA: 03/04/2014 PCP: [name], MD  Assessment/Plan:  Principal Problem:   Pleural effusion on right - Patient is status post thoracentesis and currently feeling better. - On discharge will plan on discharging with follow-up with pulmonologist for further evaluation recommendations  Active Problems:   Primary cancer of right lower lobe of lung/Brain metastasis - Patient to continue routine follow-up with oncologist after discharge - Most likely cause of pleural effusion listed above    Dyspnea - Most likely secondary to lung carcinoma as well as pleural effusion    SOB (shortness of breath) - Secondary to malignant infusion which was drained after thoracentesis with improvement of shortness of breath.  Pancytopenia - Most likely secondary to chemotherapy - Reassess next a.m. - Last chemotherapeutic agent on the ninth of this month. We'll place consult to oncologist/hematologist for further evaluation recommendations  Code Status: Full Family Communication: No family at bedside Disposition Plan: Pending improvement in condition   Consultants:  Oncology/hematology  Procedures:  None  Antibiotics:  None  HPI/Subjective: Patient has no new complaints. No acute issues reported overnight  Objective: Filed Vitals:   03/07/14 0517  BP: 114/69  Pulse: 91  Temp: 98.1 F (36.7 C)  Resp: 16    Intake/Output Summary (Last 24 hours) at 03/07/14 1247 Last data filed at 03/07/14 0516  Gross per 24 hour  Intake 340.83 ml  Output    800 ml  Net -459.17 ml   There were no vitals filed for this visit.  Exam:   General:  Patient in no acute distress, alert and awake  Cardiovascular: Regular rate and rhythm, no murmurs or rubs  Respiratory: Rales right greater than left, no wheezes, equal chest rise, improved breath sounds at right lower lobe  Abdomen: Soft, nondistended,  nontender  Musculoskeletal: No cyanosis or clubbing   Data Reviewed: Basic Metabolic Panel:  Recent Labs Lab 03/04/14 1215 03/05/14 0409  NA 134* 135  K 3.2* 3.9  CL 102 105  CO2 24 26  GLUCOSE 111* 118*  BUN 28* 24*  CREATININE 1.47* 1.37*  CALCIUM 8.4 8.1*   Liver Function Tests:  Recent Labs Lab 03/04/14 1215 03/05/14 0409  AST 39* 31  ALT 35 28  ALKPHOS 218* 175*  BILITOT 1.0 1.1  PROT 6.7 5.3*  ALBUMIN 2.9* 2.3*   No results for input(s): LIPASE, AMYLASE in the last 168 hours. No results for input(s): AMMONIA in the last 168 hours. CBC:  Recent Labs Lab 03/04/14 1215 03/05/14 0409 03/05/14 1327 03/06/14 0437 03/07/14 0527  WBC 1.7* 1.6* 1.8* 1.1* 0.9*  NEUTROABS 1.2*  --   --   --   --   HGB 8.8* 7.2* 7.2* 6.6* 8.1*  HCT 25.3* 21.0* 20.4* 18.6* 23.0*  MCV 93.7 92.9 91.9 91.6 91.3  PLT 34* 23* 18* 61* 47*   Cardiac Enzymes: No results for input(s): CKTOTAL, CKMB, CKMBINDEX, TROPONINI in the last 168 hours. BNP (last 3 results) No results for input(s): BNP in the last 8760 hours.  ProBNP (last 3 results) No results for input(s): PROBNP in the last 8760 hours.  CBG: No results for input(s): GLUCAP in the last 168 hours.  Recent Results (from the past 240 hour(s))  Body fluid culture     Status: None (Preliminary result)   Collection Time: 03/06/14  9:59 AM  Result Value Ref Range Status   Specimen Description PLEURAL RIGHT  Final   Special Requests NONE  Final   Gram Stain PENDING  Incomplete   Culture NO GROWTH Performed at [facility]   Final   Report Status PENDING  Incomplete     Studies: Dg Chest 1 View  03/06/2014   CLINICAL DATA:  Status post right thoracentesis  EXAM: CHEST  1 VIEW  COMPARISON:  03/04/2014  FINDINGS: There is a right chest wall port a catheter with tip in the cavoatrial junction. Normal heart size. Chronic interstitial coarsening is again identified bilaterally. There has been decrease in volume of the  right pleural effusion after thoracentesis. No significant pneumothorax identified.  IMPRESSION: 1. No pneumothorax after right thoracentesis.   Electronically Signed   By: Kerby Moors M.D.   On: 03/06/2014 10:37   US Thoracentesis Asp Pleural Space W/img Guide  03/06/2014   CLINICAL DATA:  Shortness of breath. Hx of right sided lung cancer. Right pleural effusion. Request for diagnostic and therapeutic thoracentesis  EXAM: ULTRASOUND GUIDED RIGHT THORACENTESIS  COMPARISON:  None  PROCEDURE: An ultrasound guided thoracentesis was thoroughly discussed with the patient and questions answered. The benefits, risks, alternatives and complications were also discussed. The patient understands and wishes to proceed with the procedure. Written consent was obtained.  Ultrasound was performed to localize and mark an adequate pocket of fluid in the right chest. The area was then prepped and draped in the normal sterile fashion. 1% Lidocaine was used for local anesthesia. Under ultrasound guidance a 19 gauge Yueh catheter was introduced. Thoracentesis was performed. The catheter was removed and a dressing applied.  COMPLICATIONS: None immediate  FINDINGS: A total of approximately 630 mL of clear yellow fluid was removed. A fluid sample wassent for laboratory analysis.  IMPRESSION: Successful ultrasound guided right thoracentesis yielding 630 mL of pleural fluid.  Read by Ascencion Dike PA-C   Electronically Signed   By: Jerilynn Mages.  Shick M.D.   On: 03/06/2014 10:12    Scheduled Meds: . sodium chloride   Intravenous Once  . feeding supplement (ENSURE COMPLETE)  237 mL Oral TID BM  . ferrous sulfate  325 mg Oral Q breakfast  . folic acid  0.5 mg Oral Daily  . simvastatin  10 mg Oral QHS   Continuous Infusions:    Time spent: > 35 minutes    Velvet Bathe  [facility] Pager [phone]. If 7PM-7AM, please contact night-coverage at [email or web address], password [facility] 03/07/2014, 12:47 PM  LOS: 1 day

## 2014-03-08 DIAGNOSIS — D6481 Anemia due to antineoplastic chemotherapy: Secondary | ICD-10-CM

## 2014-03-08 DIAGNOSIS — J918 Pleural effusion in other conditions classified elsewhere: Secondary | ICD-10-CM

## 2014-03-08 DIAGNOSIS — D701 Agranulocytosis secondary to cancer chemotherapy: Secondary | ICD-10-CM

## 2014-03-08 DIAGNOSIS — D6959 Other secondary thrombocytopenia: Secondary | ICD-10-CM

## 2014-03-08 LAB — CBC
HCT: 31.4 % — ABNORMAL LOW (ref 39.0–52.0)
Hemoglobin: 11.2 g/dL — ABNORMAL LOW (ref 13.0–17.0)
MCH: 32.1 pg (ref 26.0–34.0)
MCHC: 35.7 g/dL (ref 30.0–36.0)
MCV: 90 fL (ref 78.0–100.0)
Platelets: 25 K/uL — CL (ref 150–400)
RBC: 3.49 MIL/uL — ABNORMAL LOW (ref 4.22–5.81)
RDW: 15.7 % — ABNORMAL HIGH (ref 11.5–15.5)
WBC: 0.6 K/uL — CL (ref 4.0–10.5)

## 2014-03-08 MED ORDER — TBO-FILGRASTIM 300 MCG/0.5ML ~~LOC~~ SOSY
300.0000 ug | PREFILLED_SYRINGE | Freq: Every morning | SUBCUTANEOUS | Status: DC
  Administered 2014-03-08 – 2014-03-11 (×4): 300 ug via SUBCUTANEOUS
  Filled 2014-03-08 (×7): qty 0.5

## 2014-03-08 NOTE — Progress Notes (Signed)
CRITICAL VALUE ALERT  Critical value received:  Wbc 0.6 plt 25  Date of notification:  03/08/2014   Time of notification: 0800   Critical value read back:Yes.    Nurse who received alert:  Joaquin Courts   MD notified (1st page):  Dr Wendee Beavers  Time of first page:  8:27 AM   MD notified (2nd page): Wendee Beavers  Time of second page:  Responding MD:  9:37 AM   Time MD responded: 9:38 AM

## 2014-03-08 NOTE — Progress Notes (Signed)
[facility] PROGRESS NOTE  Preston MCCLANAHAN BJS:[id number] DOB: Feb 17, 1933 DOA: 03/04/2014 PCP: [name], MD  Assessment/Plan:  Principal Problem:   Pleural effusion on right - Patient is status post thoracentesis and currently feeling better. - On discharge will plan on discharging with follow-up with pulmonologist for further evaluation recommendations  Active Problems:   Primary cancer of right lower lobe of lung/Brain metastasis - Patient to continue routine follow-up with oncologist after discharge - Most likely cause of pleural effusion listed above    Dyspnea - Most likely secondary to lung carcinoma as well as pleural effusion    SOB (shortness of breath) - Secondary to malignant infusion which was drained after thoracentesis with improvement of shortness of breath.  Pancytopenia - Most likely secondary to chemotherapy - Reassess next a.m. - Last chemotherapeutic agent on the ninth of this month.  - oncologist consulted - Assess CBC daily  Code Status: Full Family Communication: No family at bedside Disposition Plan: Pending improvement in condition   Consultants:  Oncology/hematology  Procedures:  None  Antibiotics:  None  HPI/Subjective: Patient has no new complaints. No acute issues reported overnight  Objective: Filed Vitals:   03/08/14 1430  BP: 107/65  Pulse: 117  Temp: 97.7 F (36.5 C)  Resp: 16    Intake/Output Summary (Last 24 hours) at 03/08/14 1714 Last data filed at 03/08/14 1659  Gross per 24 hour  Intake    480 ml  Output   1400 ml  Net   -920 ml   Filed Weights   03/07/14 2008  Weight: 72.122 kg (159 lb)    Exam:   General:  Patient in no acute distress, alert and awake  Cardiovascular: Regular rate and rhythm, no murmurs or rubs  Respiratory: Rales right greater than left, no wheezes, equal chest rise, improved breath sounds at right lower lobe  Abdomen: Soft, nondistended, nontender  Musculoskeletal: No  cyanosis or clubbing   Data Reviewed: Basic Metabolic Panel:  Recent Labs Lab 03/04/14 1215 03/05/14 0409  NA 134* 135  K 3.2* 3.9  CL 102 105  CO2 24 26  GLUCOSE 111* 118*  BUN 28* 24*  CREATININE 1.47* 1.37*  CALCIUM 8.4 8.1*   Liver Function Tests:  Recent Labs Lab 03/04/14 1215 03/05/14 0409  AST 39* 31  ALT 35 28  ALKPHOS 218* 175*  BILITOT 1.0 1.1  PROT 6.7 5.3*  ALBUMIN 2.9* 2.3*   No results for input(s): LIPASE, AMYLASE in the last 168 hours. No results for input(s): AMMONIA in the last 168 hours. CBC:  Recent Labs Lab 03/04/14 1215 03/05/14 0409 03/05/14 1327 03/06/14 0437 03/07/14 0527 03/08/14 0740  WBC 1.7* 1.6* 1.8* 1.1* 0.9* 0.6*  NEUTROABS 1.2*  --   --   --   --   --   HGB 8.8* 7.2* 7.2* 6.6* 8.1* 11.2*  HCT 25.3* 21.0* 20.4* 18.6* 23.0* 31.4*  MCV 93.7 92.9 91.9 91.6 91.3 90.0  PLT 34* 23* 18* 61* 47* 25*   Cardiac Enzymes: No results for input(s): CKTOTAL, CKMB, CKMBINDEX, TROPONINI in the last 168 hours. BNP (last 3 results) No results for input(s): BNP in the last 8760 hours.  ProBNP (last 3 results) No results for input(s): PROBNP in the last 8760 hours.  CBG: No results for input(s): GLUCAP in the last 168 hours.  Recent Results (from the past 240 hour(s))  Body fluid culture     Status: None (Preliminary result)   Collection Time: 03/06/14  9:59 AM  Result Value Ref Range Status   Specimen Description PLEURAL RIGHT  Final   Special Requests NONE  Final   Gram Stain   Final    NO WBC SEEN NO ORGANISMS SEEN Performed at [facility]    Culture   Final    NO GROWTH 1 DAY Performed at [facility]    Report Status PENDING  Incomplete     Studies: No results found.  Scheduled Meds: . sodium chloride   Intravenous Once  . antiseptic oral rinse  7 mL Mouth Rinse BID  . feeding supplement (ENSURE COMPLETE)  237 mL Oral TID BM  . ferrous sulfate  325 mg Oral Q breakfast  . folic acid  0.5 mg Oral  Daily  . simvastatin  10 mg Oral QHS  . Tbo-filgastrim (GRANIX) SQ  300 mcg Subcutaneous q morning - 10a   Continuous Infusions:    Time spent: > 35 minutes    Velvet Bathe  [facility] Pager [phone]. If 7PM-7AM, please contact night-coverage at [email or web address], password [facility] 03/08/2014, 5:14 PM  LOS: 2 days

## 2014-03-08 NOTE — Progress Notes (Signed)
DIAGNOSIS: Stage IV ( T2b, N2, M1b) non-small cell lung cancer, adenocarcinoma with areas of squamous differentiation with negative EGFR mutation and negative ALK gene translocation, presented with left lower lobe lung mass in addition to mediastinal lymphadenopathy and brain metastases diagnosed in October of 2014  PRIOR THERAPY: 1) Stereotactic radiotherapy to 3 brain lesions under the care of Dr. Lisbeth Renshaw completed 12/08/2012. 2) Palliative radiotherapy to the left lower lobe lung mass under the care of Dr. Lisbeth Renshaw expected to be completed 01/01/2013. 3) Systemic chemotherapy with carboplatin for AUC of 5 and Alimta 500 mg/M2 every 3 weeks. First dose 12/23/2012. Status post 6 cycles  CURRENT THERAPY: Maintenance chemotherapy with single agent Alimta at 500 mg per meter squared given every 3 weeks. First cycle expected to be given 05/12/2013. Status post 14 cycles.   CHEMOTHERAPY INTENT: Palliative  CURRENT # OF CHEMOTHERAPY CYCLES: 14  CURRENT ANTIEMETICS:Zofran, dexamethasone and Compazine  CURRENT SMOKING STATUS: former smoker  ORAL CHEMOTHERAPY AND CONSENT: None  CURRENT BISPHOSPHONATES USE: None  PAIN MANAGEMENT: 0/10  NARCOTICS INDUCED CONSTIPATION: None  LIVING WILL AND CODE STATUS: ?  Subjective: The patient is seen and examined. His wife and son were at the bedside. This is a very pleasant 81 years old white male with metastatic non-small cell lung cancer status post induction chemotherapy was carboplatin and Alimta and he is currently on maintenance treatment with single agent Alimta status post 14 cycles. The patient was recently admitted to [facility] complaining of shortness of breath with exertion as well as occasional dizzy spells. CT angiogram of the chest on admission showed moderate sized right pleural effusion. The patient underwent right-sided ultrasound-guided thoracentesis with drainage of 630 ML of clear pleural effusion. His CBC showed continuous decline  in his total white blood count as well as absolute neutrophil count and platelets. He denied having any significant fever or chills. He has no nausea or vomiting.  Objective: Vital signs in last 24 hours: Temp:  [97.9 F (36.6 C)-99.5 F (37.5 C)] 97.9 F (36.6 C) (02/22 0455) Pulse Rate:  [100-121] 100 (02/22 0455) Resp:  [16] 16 (02/22 0455) BP: (104-124)/(67-70) 104/67 mmHg (02/22 0455) SpO2:  [98 %-100 %] 99 % (02/22 0455) Weight:  [159 lb (72.122 kg)] 159 lb (72.122 kg) (02/21 2008)  Intake/Output from previous day: 02/21 0701 - 02/22 0700 In: 240 [P.O.:240] Out: 1900 [Urine:1900] Intake/Output this shift: Total I/O In: -  Out: 200 [Urine:200]  General appearance: alert, cooperative, fatigued and no distress Resp: clear to auscultation bilaterally Cardio: regular rate and rhythm, S1, S2 normal, no murmur, click, rub or gallop GI: soft, non-tender; bowel sounds normal; no masses,  no organomegaly Extremities: extremities normal, atraumatic, no cyanosis or edema  Lab Results:   Recent Labs  03/07/14 0527 03/08/14 0740  WBC 0.9* 0.6*  HGB 8.1* 11.2*  HCT 23.0* 31.4*  PLT 47* 25*   BMET No results for input(s): NA, K, CL, CO2, GLUCOSE, BUN, CREATININE, CALCIUM in the last 72 hours.  Studies/Results: No results found.  Medications: I have reviewed the patient's current medications.  Assessment/Plan: 1) metastatic non-small cell lung cancer, adenocarcinoma status post induction chemotherapy was carboplatin and Alimta and currently on maintenance treatment with single agent Alimta status post 14 cycles and tolerating his treatment fairly well and he has a stable disease. We will resume his treatment on outpatient basis after discharge.  2) chemotherapy-induced neutropenia: I will start the patient on Granix 300 g subcutaneously on daily basis until absolute neutrophil count  is over 1000. 3) chemotherapy-induced thrombocytopenia: We will monitor for now and consider  platelet transfusion as the patient has any bleeding issues or platelets count less than 20,000. 4) chemotherapy-induced anemia: Improved after PRBCs transfusion. 5) right-sided pleural effusion: Improved after right-sided ultrasound guided thoracentesis. We will continue to monitor for now. Thank you for taking good care of Mr. Pherigo, I will continue to follow up the patient with you and assist in his management on as-needed basis.   LOS: 2 days    Casy Brunetto K. 03/08/2014

## 2014-03-09 ENCOUNTER — Ambulatory Visit: Payer: Medicare Other

## 2014-03-09 ENCOUNTER — Ambulatory Visit: Payer: Medicare Other | Admitting: Internal Medicine

## 2014-03-09 ENCOUNTER — Other Ambulatory Visit: Payer: Medicare Other

## 2014-03-09 LAB — CBC WITH DIFFERENTIAL/PLATELET
Basophils Absolute: 0 10*3/uL (ref 0.0–0.1)
Basophils Relative: 1 % (ref 0–1)
EOS ABS: 0.1 10*3/uL (ref 0.0–0.7)
Eosinophils Relative: 7 % — ABNORMAL HIGH (ref 0–5)
HEMATOCRIT: 21.9 % — AB (ref 39.0–52.0)
Hemoglobin: 8 g/dL — ABNORMAL LOW (ref 13.0–17.0)
LYMPHS ABS: 0.4 10*3/uL — AB (ref 0.7–4.0)
Lymphocytes Relative: 36 % (ref 12–46)
MCH: 32.8 pg (ref 26.0–34.0)
MCHC: 36.5 g/dL — ABNORMAL HIGH (ref 30.0–36.0)
MCV: 89.8 fL (ref 78.0–100.0)
Monocytes Absolute: 0 10*3/uL — ABNORMAL LOW (ref 0.1–1.0)
Monocytes Relative: 3 % (ref 3–12)
NEUTROS ABS: 0.6 10*3/uL — AB (ref 1.7–7.7)
Neutrophils Relative %: 53 % (ref 43–77)
Platelets: 23 10*3/uL — CL (ref 150–400)
RBC: 2.44 MIL/uL — AB (ref 4.22–5.81)
RDW: 15.4 % (ref 11.5–15.5)
WBC: 1.1 10*3/uL — AB (ref 4.0–10.5)

## 2014-03-09 LAB — BODY FLUID CULTURE
Culture: NO GROWTH
Gram Stain: NONE SEEN

## 2014-03-09 NOTE — Care Management Note (Signed)
CARE MANAGEMENT NOTE 03/09/2014  Patient:  [name], [name]   Account Number:  [id number]  Date Initiated:  03/09/2014  Documentation initiated by:  Roper Tolson  Subjective/Objective Assessment:   Pancytopenia  - Most likely secondary to chemotherapy  - Reassess next a.m.  - Last chemotherapeutic agent on the ninth of this month.  - oncologist consulted  - Assess CBC daily     Action/Plan:   home when stable   Anticipated DC Date:  03/12/2014   Anticipated DC Plan:  HOME/SELF CARE  In-house referral  NA      DC Planning Services  CM consult      PAC Choice  NA   Choice offered to / List presented to:  NA      DME agency  NA        [facility] agency  NA   Status of service:  In process, will continue to follow Medicare Important Message given?   (If response is "NO", the following Medicare IM given date fields will be blank) Date Medicare IM given:   Medicare IM given by:   Date Additional Medicare IM given:   Additional Medicare IM given by:    Discharge Disposition:    Per UR Regulation:  Reviewed for med. necessity/level of care/duration of stay  If discussed at [place] of Stay Meetings, dates discussed:    Comments:  Feb. 23 2016/Yisel Megill L. Rosana Hoes, RN, BSN, CCM. Case Management [place] [phone]

## 2014-03-09 NOTE — Progress Notes (Signed)
[facility] PROGRESS NOTE  [name] RXV:[id number] DOB: February 03, 1933 DOA: 03/04/2014 PCP: [name], MD  Brief narrative: Patient is a 81 year old with history of stage IV (T2b, N2, M1 B ) non-small cell lung cancer, adenocarcinoma with areas of squamous differentiation with mediastinal lymphadenopathy and brain metastases diagnosed in October 2014. Who presented to the hospital complaining of worsening shortness of breath. On exam patient was found to have pleural effusion which thoracentesis was performed. Shortness of breath resolved. Hospital course complicated by worsening pancytopenia of which patient required transfusion of packed red blood cells as well as platelets.  Assessment/Plan:  Principal Problem:   Pleural effusion on right - Patient is status post thoracentesis and currently feeling better. - On discharge will plan on discharging with follow-up with pulmonologist for further evaluation recommendations  Active Problems:   Primary cancer of right lower lobe of lung/Brain metastasis - Patient to continue routine follow-up with oncologist after discharge - Most likely cause of pleural effusion listed above    Dyspnea - Most likely secondary to lung carcinoma as well as pleural effusion    SOB (shortness of breath) - Secondary to malignant infusion which was drained after thoracentesis with improvement of shortness of breath.  Pancytopenia - Most likely secondary to chemotherapy earlier this month - Reassess next a.m. - Last chemotherapeutic agent on the ninth of this month.  - oncologist consulted and patient started on Granix  - Assess CBC daily  Code Status: Full Family Communication: No family at bedside Disposition Plan: Pending improvement in condition   Consultants:  Oncology/hematology  Procedures:  None  Antibiotics:  None  HPI/Subjective: Patient has no new complaints. No acute issues reported overnight  Objective: Filed Vitals:    03/09/14 0611  BP: 103/63  Pulse: 98  Temp: 98.1 F (36.7 C)  Resp: 16    Intake/Output Summary (Last 24 hours) at 03/09/14 1228 Last data filed at 03/09/14 1203  Gross per 24 hour  Intake    960 ml  Output   1750 ml  Net   -790 ml   Filed Weights   03/07/14 2008  Weight: 72.122 kg (159 lb)    Exam:   General:  Patient in no acute distress, alert and awake  Cardiovascular: Regular rate and rhythm, no murmurs or rubs  Respiratory: Rales right greater than left, no wheezes, equal chest rise, improved breath sounds at right lower lobe  Abdomen: Soft, nondistended, nontender  Musculoskeletal: No cyanosis or clubbing   Data Reviewed: Basic Metabolic Panel:  Recent Labs Lab 03/04/14 1215 03/05/14 0409  NA 134* 135  K 3.2* 3.9  CL 102 105  CO2 24 26  GLUCOSE 111* 118*  BUN 28* 24*  CREATININE 1.47* 1.37*  CALCIUM 8.4 8.1*   Liver Function Tests:  Recent Labs Lab 03/04/14 1215 03/05/14 0409  AST 39* 31  ALT 35 28  ALKPHOS 218* 175*  BILITOT 1.0 1.1  PROT 6.7 5.3*  ALBUMIN 2.9* 2.3*   No results for input(s): LIPASE, AMYLASE in the last 168 hours. No results for input(s): AMMONIA in the last 168 hours. CBC:  Recent Labs Lab 03/04/14 1215  03/05/14 1327 03/06/14 0437 03/07/14 0527 03/08/14 0740 03/09/14 0610  WBC 1.7*  < > 1.8* 1.1* 0.9* 0.6* 1.1*  NEUTROABS 1.2*  --   --   --   --   --  0.6*  HGB 8.8*  < > 7.2* 6.6* 8.1* 11.2* 8.0*  HCT 25.3*  < > 20.4* 18.6* 23.0*  31.4* 21.9*  MCV 93.7  < > 91.9 91.6 91.3 90.0 89.8  PLT 34*  < > 18* 61* 47* 25* 23*  < > = values in this interval not displayed. Cardiac Enzymes: No results for input(s): CKTOTAL, CKMB, CKMBINDEX, TROPONINI in the last 168 hours. BNP (last 3 results) No results for input(s): BNP in the last 8760 hours.  ProBNP (last 3 results) No results for input(s): PROBNP in the last 8760 hours.  CBG: No results for input(s): GLUCAP in the last 168 hours.  Recent Results (from the  past 240 hour(s))  Body fluid culture     Status: None   Collection Time: 03/06/14  9:59 AM  Result Value Ref Range Status   Specimen Description PLEURAL RIGHT  Final   Special Requests NONE  Final   Gram Stain   Final    NO WBC SEEN NO ORGANISMS SEEN Performed at [facility]    Culture   Final    NO GROWTH 3 DAYS Performed at [facility]    Report Status 03/09/2014 FINAL  Final     Studies: No results found.  Scheduled Meds: . sodium chloride   Intravenous Once  . antiseptic oral rinse  7 mL Mouth Rinse BID  . feeding supplement (ENSURE COMPLETE)  237 mL Oral TID BM  . ferrous sulfate  325 mg Oral Q breakfast  . folic acid  0.5 mg Oral Daily  . simvastatin  10 mg Oral QHS  . Tbo-filgastrim (GRANIX) SQ  300 mcg Subcutaneous q morning - 10a   Continuous Infusions:    Time spent: > 35 minutes    Velvet Bathe  [facility] Pager [phone]. If 7PM-7AM, please contact night-coverage at [email or web address], password [facility] 03/09/2014, 12:28 PM  LOS: 3 days

## 2014-03-09 NOTE — Progress Notes (Signed)
Subjective: The patient is seen and examined today. He is feeling fine with no specific complaints today. He denied having any significant bleeding issues. He has no rectal bleeding. He denied having any significant fever or chills.  Objective: Vital signs in last 24 hours: Temp:  [97.8 F (36.6 C)-98.1 F (36.7 C)] 97.8 F (36.6 C) (02/23 1316) Pulse Rate:  [98-111] 111 (02/23 1316) Resp:  [15-16] 15 (02/23 1316) BP: (101-115)/(63-68) 101/63 mmHg (02/23 1316) SpO2:  [94 %-98 %] 98 % (02/23 1316)  Intake/Output from previous day: 02/22 0701 - 02/23 0700 In: 720 [P.O.:720] Out: 1550 [Urine:1550] Intake/Output this shift: Total I/O In: 1600 [P.O.:1600] Out: 800 [Urine:800]  General appearance: alert, cooperative and no distress Resp: clear to auscultation bilaterally Cardio: regular rate and rhythm, S1, S2 normal, no murmur, click, rub or gallop GI: soft, non-tender; bowel sounds normal; no masses,  no organomegaly Extremities: extremities normal, atraumatic, no cyanosis or edema  Lab Results:   Recent Labs  03/08/14 0740 03/09/14 0610  WBC 0.6* 1.1*  HGB 11.2* 8.0*  HCT 31.4* 21.9*  PLT 25* 23*   BMET No results for input(s): NA, K, CL, CO2, GLUCOSE, BUN, CREATININE, CALCIUM in the last 72 hours.  Studies/Results: No results found.  Medications: I have reviewed the patient's current medications.  Assessment/Plan: 1) metastatic non-small cell lung cancer, adenocarcinoma status post induction chemotherapy was carboplatin and Alimta and currently on maintenance treatment with single agent Alimta status post 14 cycles and tolerating his treatment fairly well and he has a stable disease. We will resume his treatment on outpatient basis after discharge.  2) chemotherapy-induced neutropenia: There is mild improvement in his total white blood count. The patient will continue on Granix 300 g subcutaneously on daily basis until absolute neutrophil count is over 1000. 3)  chemotherapy-induced thrombocytopenia: Platelets count is a stable from yesterday. We will monitor for now and consider platelet transfusion as the patient has any bleeding issues or platelets count less than 20,000. 4) chemotherapy-induced anemia: His hemoglobin is down today with no evidence of bleeding. We'll continue to monitor this closely and consider the patient for PRBCs transfusion if his hemoglobin is less than 8.0 g/dL.Marland Kitchen 5) right-sided pleural effusion: Improved after right-sided ultrasound guided thoracentesis. We will continue to monitor for now.   LOS: 3 days    Nadyne Gariepy K. 03/09/2014

## 2014-03-10 LAB — CBC WITH DIFFERENTIAL/PLATELET
Basophils Absolute: 0 10*3/uL (ref 0.0–0.1)
Basophils Relative: 0 % (ref 0–1)
Eosinophils Absolute: 0.1 10*3/uL (ref 0.0–0.7)
Eosinophils Relative: 5 % (ref 0–5)
HCT: 22.7 % — ABNORMAL LOW (ref 39.0–52.0)
Hemoglobin: 8.2 g/dL — ABNORMAL LOW (ref 13.0–17.0)
Lymphocytes Relative: 25 % (ref 12–46)
Lymphs Abs: 0.5 10*3/uL — ABNORMAL LOW (ref 0.7–4.0)
MCH: 32.3 pg (ref 26.0–34.0)
MCHC: 36.1 g/dL — ABNORMAL HIGH (ref 30.0–36.0)
MCV: 89.4 fL (ref 78.0–100.0)
Monocytes Absolute: 0 10*3/uL — ABNORMAL LOW (ref 0.1–1.0)
Monocytes Relative: 2 % — ABNORMAL LOW (ref 3–12)
Neutro Abs: 1.2 10*3/uL — ABNORMAL LOW (ref 1.7–7.7)
Neutrophils Relative %: 68 % (ref 43–77)
Platelets: 14 10*3/uL — CL (ref 150–400)
RBC: 2.54 MIL/uL — ABNORMAL LOW (ref 4.22–5.81)
RDW: 15.3 % (ref 11.5–15.5)
WBC: 1.8 10*3/uL — ABNORMAL LOW (ref 4.0–10.5)

## 2014-03-10 MED ORDER — MEGESTROL ACETATE 40 MG PO TABS
40.0000 mg | ORAL_TABLET | Freq: Every day | ORAL | Status: DC
  Administered 2014-03-10 – 2014-03-11 (×2): 40 mg via ORAL
  Filled 2014-03-10 (×2): qty 1

## 2014-03-10 MED ORDER — SODIUM CHLORIDE 0.9 % IV SOLN: Freq: Once | INTRAVENOUS | Status: DC

## 2014-03-10 MED ORDER — MIRTAZAPINE 15 MG PO TABS: 15.0000 mg | ORAL_TABLET | Freq: Every day | ORAL | Status: DC

## 2014-03-10 NOTE — Progress Notes (Signed)
[facility]                                                                              Patient Demographics  [name], is a 81 y.o. male, DOB - Apr 12, 1933, QQI:[id number]  Admit date - 03/04/2014   Admitting Physician Donne Hazel, MD  Outpatient Primary MD for the patient is [name], MD  LOS - 4   Chief Complaint  Patient presents with  . Shortness of Breath  . Chemo Card       HPI on 03/04/2014 by Dr. [name] is a 81 y.o. male with a hx of metastatic lung cancer followed by Dr. Julien Nordmann and hx of recurrent symptomatic chemo related anemia who presents to the ED with complaints of worsening sob on exertion with dizziness upon standing. In the ED, pt was noted to have normal o2 sats on RA but with elevated d-dimer with mild sinus tachycardia. CTA was done which demonstrated a moderate sized R pleural effusion as well as pericardial effusion. Hgb was noted to be just under 9. The hospitalist service was consulted for consideration for admission.  On further questioning, pt first noted some sob and dizziness since 11/15 but attributed to chemo treatments.  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  Will receive platelet transfusion today.  Time Spent in minutes    30 minutes  Procedures  Right thoracentesis on 03/06/2014  Consults   Oncology Interventional radiology  DVT Prophylaxis  SCDs  Lab Results  Component Value Date   PLT 14* 03/10/2014    Medications  Scheduled Meds: . sodium chloride   Intravenous Once  . sodium chloride   Intravenous Once  . antiseptic oral rinse  7 mL Mouth Rinse BID  . feeding supplement (ENSURE COMPLETE)  237 mL Oral TID BM  . ferrous sulfate  325 mg Oral Q breakfast  . folic acid  0.5 mg Oral Daily  . megestrol  40 mg Oral Daily  . simvastatin  10 mg Oral QHS  . Tbo-filgastrim (GRANIX) SQ  300 mcg Subcutaneous q morning - 10a   Continuous Infusions:  PRN Meds:.acetaminophen, morphine injection, ondansetron **OR** ondansetron (ZOFRAN) IV  Antibiotics    Anti-infectives    None        Subjective:   [name] seen and examined today.  Patient denies any further shortness of breath or chest pain. He is wanting to go home.  Objective:   Filed Vitals:   03/09/14 2101 03/10/14 0616 03/10/14 1135 03/10/14 1200  BP: 107/52 96/61 100/70 105/65  Pulse: 120 98 100 93  Temp: 98 F (36.7 C) 97.4 F (36.3 C) 97.7 F (36.5 C) 97.5 F (36.4 C)  TempSrc: Oral Oral Oral Oral  Resp: 15 15 15 16   Height:      Weight:      SpO2: 99% 98% 100% 100%    Wt Readings from Last 3 Encounters:  03/07/14 72.122 kg (159 lb)  02/16/14 71.85 kg (158 lb 6.4 oz)  02/04/14 70.806 kg (156 lb 1.6 oz)     Intake/Output Summary (Last 24 hours) at 03/10/14 1346 Last data filed at 03/10/14 1130  Gross per 24 hour  Intake    880 ml  Output   2000 ml  Net  -1120 ml    Exam  General: Well developed, well nourished, NAD, appears stated age  23: NCAT,mucous membranes moist.   Cardiovascular: S1 S2 auscultated, no rubs, murmurs or gallops. Regular rate and rhythm.  Respiratory: Clear to auscultation bilaterally with equal chest rise  Abdomen: Soft, nontender, nondistended, + bowel sounds  Extremities:  warm dry without cyanosis clubbing or edema  Neuro: AAOx3, nonfocal  Skin: Without rashes exudates or nodules  Psych: Normal affect and demeanor with intact judgement and insight  Data Review   Micro Results Recent Results (from the past 240 hour(s))  Body fluid culture     Status: None   Collection Time: 03/06/14  9:59 AM  Result Value Ref Range Status   Specimen Description PLEURAL RIGHT  Final   Special Requests NONE  Final   Gram Stain   Final    NO WBC SEEN NO ORGANISMS SEEN Performed at [facility]    Culture   Final    NO GROWTH 3 DAYS Performed at [facility]    Report Status 03/09/2014 FINAL  Final    Radiology Reports Dg Chest 1 View  03/06/2014   CLINICAL DATA:  Status post right thoracentesis  EXAM: CHEST  1 VIEW  COMPARISON:  03/04/2014  FINDINGS: There is a right chest wall port a catheter with tip in the cavoatrial junction. Normal heart size. Chronic interstitial coarsening is again identified bilaterally. There has been decrease in volume of the right pleural effusion after thoracentesis. No significant pneumothorax identified.  IMPRESSION: 1. No pneumothorax after right thoracentesis.   Electronically Signed   By: Kerby Moors M.D.   On: 03/06/2014 10:37   Dg Chest 2 View  03/04/2014   CLINICAL DATA:  Known lung cancer with shortness of Breath  EXAM: CHEST  2 VIEW  COMPARISON:  02/04/2014  FINDINGS: Cardiac shadow is stable. A right-sided chest wall port is again seen and stable. Stable changes in the right hilum extending toward is a pleura are noted similar to that seen on the prior exam. Diffuse interstitial changes are noted throughout the left lung and stable. A stable right-sided pleural effusion is noted as well. No new focal abnormality is seen.  IMPRESSION: Chronic changes without acute abnormality.   Electronically Signed   By: Inez Catalina M.D.   On: 03/04/2014 12:25   Ct Angio Chest Pe W/cm &/or Wo Cm  03/04/2014   CLINICAL DATA:   Shortness of Breath.  Lung carcinoma.  Anemia  EXAM: CT ANGIOGRAPHY CHEST WITH CONTRAST  TECHNIQUE: Multidetector CT imaging of the chest was performed using the standard protocol during bolus administration of intravenous contrast. Multiplanar CT image reconstructions and MIPs were obtained to evaluate the vascular anatomy.  CONTRAST:  142mL OMNIPAQUE IOHEXOL 350 MG/ML SOLN  COMPARISON:  Chest radiograph March 04, 2014 and chest CT January 22, 2014  FINDINGS: There is no demonstrable pulmonary embolus. There is no thoracic aortic aneurysm or dissection. There is atherosclerotic change in the aorta.  There is a moderate pleural effusion on the right with right base consolidation. There is underlying emphysema with areas of peripheral pulmonary fibrosis, stable. There is a lobulated mass in superior segment of the right lower lobe measuring 1.8 x 1.7 cm, slightly larger than on the previous study. There is a partially cavitary mass in the superior segment right lower lobe more medially measuring 4.8 by 2.7 cm, stable.  There is a moderate pericardial effusion. There is left ventricular hypertrophy. There is an enlarged sub- carinal lymph node measuring 2.4 x1.2 cm, stable. There is adenopathy in the right hilum, indistinguishable from the right lower lobe partially cavitated mass. No new lymph node prominence is seen.  In the visualized upper abdomen, there is hepatic steatosis.  There are no blastic or lytic bone lesions. Thyroid appears unremarkable.  Review of the MIP images confirms the above findings.  IMPRESSION: No demonstrable pulmonary embolus. Moderate right effusion. Partially cavitary mass right lower lobe, stable. A smaller mass also in the superior segment right lower lobe has become slightly larger compared to the previous examination, currently measuring 1.8 x 1.7 cm. There is underlying interstitial fibrosis and emphysema. Stable lymph node prominence the right hilar and subcarinal regions.    Electronically Signed   By: Lowella Grip III M.D.   On: 03/04/2014 15:59   US Thoracentesis Asp Pleural Space W/img Guide  03/06/2014   CLINICAL DATA:  Shortness of breath. Hx of right sided lung cancer. Right pleural effusion. Request for diagnostic and therapeutic thoracentesis  EXAM: ULTRASOUND GUIDED RIGHT THORACENTESIS  COMPARISON:  None  PROCEDURE: An ultrasound guided thoracentesis was thoroughly discussed with the patient and questions answered. The benefits, risks, alternatives and complications were also discussed. The patient understands and wishes to proceed with the procedure. Written consent was obtained.  Ultrasound was performed to localize and mark an adequate pocket of fluid in the right chest. The area was then prepped and draped in the normal sterile fashion. 1% Lidocaine was used for local anesthesia. Under ultrasound guidance a 19 gauge Yueh catheter was introduced. Thoracentesis was performed. The catheter was removed and a dressing applied.  COMPLICATIONS: None immediate  FINDINGS: A total of approximately 630 mL of clear yellow fluid was removed. A fluid sample wassent for laboratory analysis.  IMPRESSION: Successful ultrasound guided right thoracentesis yielding 630 mL of pleural fluid.  Read by Ascencion Dike PA-C   Electronically Signed   By: Jerilynn Mages.  Shick M.D.   On: 03/06/2014 10:12    CBC  Recent Labs Lab 03/04/14 1215  03/06/14 0437 03/07/14 0527 03/08/14 0740 03/09/14 0610 03/10/14 0530  WBC 1.7*  < > 1.1* 0.9* 0.6* 1.1* 1.8*  HGB 8.8*  < > 6.6* 8.1* 11.2* 8.0* 8.2*  HCT 25.3*  < > 18.6* 23.0* 31.4* 21.9* 22.7*  PLT 34*  < > 61* 47* 25* 23* 14*  MCV 93.7  < > 91.6 91.3 90.0 89.8 89.4  MCH 32.6  < > 32.5 32.1 32.1 32.8 32.3  MCHC 34.8  < > 35.5 35.2 35.7 36.5* 36.1*  RDW 16.8*  < > 16.6* 16.0* 15.7* 15.4 15.3  LYMPHSABS 0.3*  --   --   --   --  0.4* 0.5*  MONOABS 0.1  --   --   --   --  0.0*  0.0*  EOSABS 0.1  --   --   --   --  0.1 0.1  BASOSABS 0.0  --   --    --   --  0.0 0.0  < > = values in this interval not displayed.  Chemistries   Recent Labs Lab 03/04/14 1215 03/05/14 0409  NA 134* 135  K 3.2* 3.9  CL 102 105  CO2 24 26  GLUCOSE 111* 118*  BUN 28* 24*  CREATININE 1.47* 1.37*  CALCIUM 8.4 8.1*  AST 39* 31  ALT 35 28  ALKPHOS 218* 175*  BILITOT 1.0 1.1   ------------------------------------------------------------------------------------------------------------------ estimated creatinine clearance is 45.3 mL/min (by C-G formula based on Cr of 1.37). ------------------------------------------------------------------------------------------------------------------ No results for input(s): HGBA1C in the last 72 hours. ------------------------------------------------------------------------------------------------------------------ No results for input(s): CHOL, HDL, LDLCALC, TRIG, CHOLHDL, LDLDIRECT in the last 72 hours. ------------------------------------------------------------------------------------------------------------------ No results for input(s): TSH, T4TOTAL, T3FREE, THYROIDAB in the last 72 hours.  Invalid input(s): FREET3 ------------------------------------------------------------------------------------------------------------------ No results for input(s): VITAMINB12, FOLATE, FERRITIN, TIBC, IRON, RETICCTPCT in the last 72 hours.  Coagulation profile No results for input(s): INR, PROTIME in the last 168 hours.  No results for input(s): DDIMER in the last 72 hours.  Cardiac Enzymes No results for input(s): CKMB, TROPONINI, MYOGLOBIN in the last 168 hours.  Invalid input(s): CK ------------------------------------------------------------------------------------------------------------------ Invalid input(s): POCBNP    Dalin Caldera D.O. on 03/10/2014 at 1:46 PM  Between 7am to 7pm - Pager - [phone]  After 7pm go to [email or web address] - password [email or web address]  And look for the night coverage person covering  for me after hours  [facility] Office  [phone]

## 2014-03-10 NOTE — Progress Notes (Signed)
Critical lab value, platelets count 14. On call provider notified. No new orders at this time. Will continue to monitor.

## 2014-03-11 LAB — CBC WITH DIFFERENTIAL/PLATELET
Basophils Absolute: 0 10*3/uL (ref 0.0–0.1)
Basophils Relative: 0 % (ref 0–1)
EOS ABS: 0.1 10*3/uL (ref 0.0–0.7)
Eosinophils Relative: 4 % (ref 0–5)
HEMATOCRIT: 21.4 % — AB (ref 39.0–52.0)
HEMOGLOBIN: 7.6 g/dL — AB (ref 13.0–17.0)
LYMPHS PCT: 18 % (ref 12–46)
Lymphs Abs: 0.6 10*3/uL — ABNORMAL LOW (ref 0.7–4.0)
MCH: 31.8 pg (ref 26.0–34.0)
MCHC: 35.5 g/dL (ref 30.0–36.0)
MCV: 89.5 fL (ref 78.0–100.0)
Monocytes Absolute: 0.1 10*3/uL (ref 0.1–1.0)
Monocytes Relative: 3 % (ref 3–12)
NEUTROS ABS: 2.7 10*3/uL (ref 1.7–7.7)
NEUTROS PCT: 75 % (ref 43–77)
Platelets: 53 10*3/uL — ABNORMAL LOW (ref 150–400)
RBC: 2.39 MIL/uL — ABNORMAL LOW (ref 4.22–5.81)
RDW: 15.3 % (ref 11.5–15.5)
WBC: 3.5 10*3/uL — ABNORMAL LOW (ref 4.0–10.5)

## 2014-03-11 LAB — PREPARE PLATELET PHERESIS: Unit division: 0

## 2014-03-11 LAB — PREPARE RBC (CROSSMATCH)

## 2014-03-11 MED ORDER — SODIUM CHLORIDE 0.9 % IV SOLN
Freq: Once | INTRAVENOUS | Status: AC
  Administered 2014-03-11: 12:00:00 via INTRAVENOUS

## 2014-03-11 MED ORDER — MEGESTROL ACETATE 40 MG PO TABS: 40.0000 mg | ORAL_TABLET | Freq: Every day | ORAL | Status: DC

## 2014-03-11 MED ORDER — ENSURE COMPLETE PO LIQD: 237.0000 mL | Freq: Three times a day (TID) | ORAL | Status: AC

## 2014-03-11 MED ORDER — HEPARIN SOD (PORK) LOCK FLUSH 100 UNIT/ML IV SOLN
500.0000 [IU] | INTRAVENOUS | Status: DC | PRN
  Filled 2014-03-11: qty 5

## 2014-03-11 NOTE — Discharge Instructions (Signed)
Pleural Effusion The lining covering your lungs and the inside of your chest is called the pleura. Usually, the space between the two pleura contains no air and only a thin layer of fluid. A pleural effusion is an abnormal buildup of fluid in the pleural space. Fluid gathers when there is increased pressure in the lung vessels. This forces fluids out of the lungs and into the pleural space. Vessels may also leak fluids when there are infections, such as pneumonia, or other causes of soreness and redness (inflammation). Fluids leak into the lungs when protein in the blood is low or when certain vessels (lymphatics) are blocked. Finding a pleural effusion is important because it is usually caused by another disease. In order to treat a pleural effusion, your health care provider needs to find its cause. If left untreated, a large amount of fluid can build up and cause collapse of the lung. CAUSES   Heart failure.  Infections (pneumonia, tuberculosis), pulmonary embolism, pulmonary infarction.  Cancer (primary lung and metastatic), asbestosis.  Liver failure (cirrhosis).  Nephrotic syndrome, peritoneal dialysis, kidney problems (uremia).  Collagen vascular disease (systemic lupus erythematosus, rheumatoid arthritis).  Injury (trauma) to the chest or rupture of the digestive tube (esophagus).  Material in the chest or pleural space (hemothorax, chylothorax).  Pancreatitis.  Surgery.  Drug reactions. SYMPTOMS  A pleural effusion can decrease the amount of space available for breathing and make you short of breath. The fluid can become infected, which may cause pain and fever. Often, the pain is worse when taking a deep breath. The underlying disease (heart failure, pneumonia, blood clot, tuberculosis, cancer) may also cause symptoms. DIAGNOSIS   Your health care provider can usually tell what is wrong by talking to you (taking a history), doing an exam, and taking a routine X-ray. If the  X-ray shows fluid in your chest, often fluid is removed from your chest with a needle for testing (diagnostic thoracentesis).  Sometimes, more specialized X-rays may be needed.  Sometimes, a small piece of tissue is removed and examined by a specialist (biopsy). TREATMENT  Treatment varies based on what caused the pleural effusion. Treatments include:  Removing as much fluid as possible using a needle (thoracentesis) to improve the cough and shortness of breath. This is a simple procedure that can be done at bedside. The risks are bleeding, infection, collapse of a lung, or low blood pressure.  Placing a tube in the chest to drain the effusion (tube thoracostomy). This is often used when there is an infection in the fluid. This is a simple procedure that can often be done at bedside or in a clinic. The procedure may be painful. The risks are the same as using a needle to drain the fluid. The chest tube usually remains for a few days and is connected to suction to improve fluid drainage. After placement, the tube usually does not cause much discomfort.  Surgical removal of fibrous debris in and around the pleural space (decortication). This may be done with a flexible telescope (thoracoscope) through a small or large cut (incision). This is helpful for patients who have fibrosis or scar tissue that prevents complete lung expansion. The risks are infection, blood loss, and side effects from general anesthesia.  Sometimes, a procedure called pleurodesis is done. A chest tube is placed and the fluid is drained. Next, an agent (tetracycline, talc powder) is added to the pleural space. This causes the lung and chest wall to stick together (adhesion). This leaves no  potential space for fluid to build up. The risks include infection, blood loss, and side effects from general anesthesia.  If the effusion is caused by infection, it may be treated with antibiotics and may improve without draining. HOME CARE  INSTRUCTIONS   Take any medicines exactly as prescribed.  Follow up with your health care provider as directed.  Monitor your exercise capacity (the amount of walking you can do before you get short of breath).  Do not use any tobacco products including cigarettes, chewing tobacco, or electronic cigarettes. SEEK MEDICAL CARE IF:   Your exercise capacity seems to get worse or does not improve with time.  You do not recover from your illness.  You have drainage, redness, swelling, or pain at any incision or puncture sites. SEEK IMMEDIATE MEDICAL CARE IF:   Shortness of breath or chest pain develops or gets worse.  You have a fever.  You develop a new cough, especially if the mucus (phlegm) is discolored. MAKE SURE YOU:   Understand these instructions.  Will watch your condition.  Will get help right away if you are not doing well or get worse. Document Released: 01/01/2005 Document Revised: 05/18/2013 Document Reviewed: 08/23/2006 [facility] Patient Information 2015 [place], [place]. This information is not intended to replace advice given to you by your health care provider. Make sure you discuss any questions you have with your health care provider.

## 2014-03-11 NOTE — Discharge Summary (Signed)
Physician Discharge Summary  [name]:[id number] DOB: 1933/04/06 DOA: 03/04/2014  PCP: [name], MD  Admit date: 03/04/2014 Discharge date: 03/11/2014  Time spent: 45 minutes  Recommendations for Outpatient Follow-up:  Patient will be discharged to home. He should follow-up with his primary care physician within one week of discharge. Patient should also follow-up with Dr. Earlie Server, oncologist. Patient should have a repeat CBC and CMP within 1 week of discharge. He should continue a regular diet. Patient to continue his medications as prescribed. Patient may resume activity as tolerated.  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 [name] is a 81 y.o. male with a hx of metastatic lung cancer followed by Dr. Julien Nordmann and hx of recurrent symptomatic chemo related anemia who presents to the ED with complaints of worsening sob on exertion with dizziness upon standing. In the ED, pt was noted to have normal o2 sats on RA but with elevated d-dimer with mild sinus tachycardia. CTA was done which demonstrated a moderate sized R pleural effusion as well as pericardial effusion. Hgb was noted to be just under 9. The hospitalist service was consulted for consideration for admission. On further questioning, pt first noted some sob and dizziness since 11/15 but attributed to chemo treatments.  Hospital Course:  Dyspnea secondary to Right Pleural Effusion -Likely secondary to lung cancer  -Status post thoracentesis 03/06/2014- 664ml clear yellow fluid removed -Cytology shows reactive mesothelial cells -Per patient, feels breathing has improved -Patient will need a follow-up with pulmonology upon  discharge  Primary cancer of the right lower lobe of lung/brain metastasis -Oncology consulted and appreciated  Pancytopenia -Likely chemotherapy-induced (last treatment 2/9) -Patient received units of platelets 03/10/2014, platelets today 53000 -Continue Granix -Oncology recommended platelet transfusion for counts less than 20,000, blood transfusion for hemoglobin less than 8 -Hb 7.6, will transfuse patient with 1uPRBCs  Malnutrition -Continue supplements and megace  Procedures  Right thoracentesis on 03/06/2014  Consults  Oncology Interventional radiology  Discharge Exam: Filed Vitals:   03/11/14 0512  BP: 100/62  Pulse: 103  Temp: 97.8 F (36.6 C)  Resp: 16   Exam  General: Well developed, NAD  HEENT: NCAT,mucous membranes moist.   Cardiovascular: S1 S2 auscultated, no rubs, murmurs or gallops. Regular rate and rhythm.  Respiratory: Clear to auscultation bilaterally with equal chest rise  Abdomen: Soft, nontender, nondistended, + bowel sounds  Extremities: warm dry without cyanosis clubbing or edema  Neuro: AAOx3, nonfocal  Psych: Appropriate mood and affect  Discharge Instructions      Discharge Instructions    Discharge instructions    Complete by:  As directed   Patient will be discharged to home. He should follow-up with his primary care physician within one week of discharge. Patient should also follow-up with Dr. Earlie Server, oncologist. Patient should have a repeat CBC and CMP within 1 week of discharge. He should continue a regular diet. Patient to continue his medications as prescribed. Patient may resume activity as tolerated.            Medication List    STOP taking these medications        azithromycin 250 MG tablet  Commonly known as:  ZITHROMAX Z-PAK      TAKE these  medications        acetaminophen 500 MG tablet  Commonly known as:  TYLENOL  Take 500 mg by mouth every 6 (six) hours as needed for mild pain.     ALIMTA IV   Inject into the vein every 21 ( twenty-one) days.     clobetasol cream 0.05 %  Commonly known as:  TEMOVATE  Apply 1 application topically daily as needed (for itching).     dexamethasone 4 MG tablet  Commonly known as:  DECADRON  4 mg by mouth twice a day the day before, day of and day after the chemotherapy every 3 weeks     feeding supplement (ENSURE COMPLETE) Liqd  Take 237 mLs by mouth 3 (three) times daily between meals.     folic acid 952 MCG tablet  Commonly known as:  FOLVITE  Take 400 mcg by mouth every morning.     guaiFENesin 600 MG 12 hr tablet  Commonly known as:  MUCINEX  Take 600 mg by mouth at bedtime as needed for cough.     IRON SUPPLEMENT 325 (65 FE) MG tablet  Generic drug:  ferrous sulfate  Take 325 mg by mouth daily with breakfast.     magic mouthwash Soln  Take 5 mLs by mouth 3 (three) times daily as needed for mouth pain.     megestrol 40 MG tablet  Commonly known as:  MEGACE  Take 1 tablet (40 mg total) by mouth daily.     simvastatin 10 MG tablet  Commonly known as:  ZOCOR  Take 10 mg by mouth at bedtime.     SYSTANE OP  Place 1 drop into both eyes daily.       Allergies  Allergen Reactions  . Asa [Aspirin] Nausea Only  . Sulfa Antibiotics Nausea And Vomiting   Follow-up Information    Follow up with [name], MD. Schedule an appointment as soon as possible for a visit in 1 week.   Specialty:  Family Medicine   Why:  Hospital followup   Contact information:   [place] [address] [phone]       Follow up with Eilleen Kempf., MD. Schedule an appointment as soon as possible for a visit in 1 week.   Specialty:  Oncology   Contact information:   [address] [place] [address] [phone]        The results of significant diagnostics from this hospitalization (including imaging, microbiology, ancillary and laboratory) are listed below for reference.    Significant Diagnostic Studies: Dg  Chest 1 View  03/06/2014   CLINICAL DATA:  Status post right thoracentesis  EXAM: CHEST  1 VIEW  COMPARISON:  03/04/2014  FINDINGS: There is a right chest wall port a catheter with tip in the cavoatrial junction. Normal heart size. Chronic interstitial coarsening is again identified bilaterally. There has been decrease in volume of the right pleural effusion after thoracentesis. No significant pneumothorax identified.  IMPRESSION: 1. No pneumothorax after right thoracentesis.   Electronically Signed   By: Kerby Moors M.D.   On: 03/06/2014 10:37   Dg Chest 2 View  03/04/2014   CLINICAL DATA:  Known lung cancer with shortness of Breath  EXAM: CHEST  2 VIEW  COMPARISON:  02/04/2014  FINDINGS: Cardiac shadow is stable. A right-sided chest wall port is again seen and stable. Stable changes in the right hilum extending toward is a pleura are noted similar to that seen on the prior  exam. Diffuse interstitial changes are noted throughout the left lung and stable. A stable right-sided pleural effusion is noted as well. No new focal abnormality is seen.  IMPRESSION: Chronic changes without acute abnormality.   Electronically Signed   By: Inez Catalina M.D.   On: 03/04/2014 12:25   Ct Angio Chest Pe W/cm &/or Wo Cm  03/04/2014   CLINICAL DATA:  Shortness of Breath.  Lung carcinoma.  Anemia  EXAM: CT ANGIOGRAPHY CHEST WITH CONTRAST  TECHNIQUE: Multidetector CT imaging of the chest was performed using the standard protocol during bolus administration of intravenous contrast. Multiplanar CT image reconstructions and MIPs were obtained to evaluate the vascular anatomy.  CONTRAST:  174mL OMNIPAQUE IOHEXOL 350 MG/ML SOLN  COMPARISON:  Chest radiograph March 04, 2014 and chest CT January 22, 2014  FINDINGS: There is no demonstrable pulmonary embolus. There is no thoracic aortic aneurysm or dissection. There is atherosclerotic change in the aorta.  There is a moderate pleural effusion on the right with right base  consolidation. There is underlying emphysema with areas of peripheral pulmonary fibrosis, stable. There is a lobulated mass in superior segment of the right lower lobe measuring 1.8 x 1.7 cm, slightly larger than on the previous study. There is a partially cavitary mass in the superior segment right lower lobe more medially measuring 4.8 by 2.7 cm, stable.  There is a moderate pericardial effusion. There is left ventricular hypertrophy. There is an enlarged sub- carinal lymph node measuring 2.4 x1.2 cm, stable. There is adenopathy in the right hilum, indistinguishable from the right lower lobe partially cavitated mass. No new lymph node prominence is seen.  In the visualized upper abdomen, there is hepatic steatosis.  There are no blastic or lytic bone lesions. Thyroid appears unremarkable.  Review of the MIP images confirms the above findings.  IMPRESSION: No demonstrable pulmonary embolus. Moderate right effusion. Partially cavitary mass right lower lobe, stable. A smaller mass also in the superior segment right lower lobe has become slightly larger compared to the previous examination, currently measuring 1.8 x 1.7 cm. There is underlying interstitial fibrosis and emphysema. Stable lymph node prominence the right hilar and subcarinal regions.   Electronically Signed   By: Lowella Grip III M.D.   On: 03/04/2014 15:59   US Thoracentesis Asp Pleural Space W/img Guide  03/06/2014   CLINICAL DATA:  Shortness of breath. Hx of right sided lung cancer. Right pleural effusion. Request for diagnostic and therapeutic thoracentesis  EXAM: ULTRASOUND GUIDED RIGHT THORACENTESIS  COMPARISON:  None  PROCEDURE: An ultrasound guided thoracentesis was thoroughly discussed with the patient and questions answered. The benefits, risks, alternatives and complications were also discussed. The patient understands and wishes to proceed with the procedure. Written consent was obtained.  Ultrasound was performed to localize and mark  an adequate pocket of fluid in the right chest. The area was then prepped and draped in the normal sterile fashion. 1% Lidocaine was used for local anesthesia. Under ultrasound guidance a 19 gauge Yueh catheter was introduced. Thoracentesis was performed. The catheter was removed and a dressing applied.  COMPLICATIONS: None immediate  FINDINGS: A total of approximately 630 mL of clear yellow fluid was removed. A fluid sample wassent for laboratory analysis.  IMPRESSION: Successful ultrasound guided right thoracentesis yielding 630 mL of pleural fluid.  Read by Ascencion Dike PA-C   Electronically Signed   By: Jerilynn Mages.  Shick M.D.   On: 03/06/2014 10:12    Microbiology: Recent Results (from the past 240 hour(s))  Body fluid culture     Status: None   Collection Time: 03/06/14  9:59 AM  Result Value Ref Range Status   Specimen Description PLEURAL RIGHT  Final   Special Requests NONE  Final   Gram Stain   Final    NO WBC SEEN NO ORGANISMS SEEN Performed at [facility]    Culture   Final    NO GROWTH 3 DAYS Performed at [facility]    Report Status 03/09/2014 FINAL  Final     Labs: Basic Metabolic Panel:  Recent Labs Lab 03/04/14 1215 03/05/14 0409  NA 134* 135  K 3.2* 3.9  CL 102 105  CO2 24 26  GLUCOSE 111* 118*  BUN 28* 24*  CREATININE 1.47* 1.37*  CALCIUM 8.4 8.1*   Liver Function Tests:  Recent Labs Lab 03/04/14 1215 03/05/14 0409  AST 39* 31  ALT 35 28  ALKPHOS 218* 175*  BILITOT 1.0 1.1  PROT 6.7 5.3*  ALBUMIN 2.9* 2.3*   No results for input(s): LIPASE, AMYLASE in the last 168 hours. No results for input(s): AMMONIA in the last 168 hours. CBC:  Recent Labs Lab 03/04/14 1215  03/07/14 0527 03/08/14 0740 03/09/14 0610 03/10/14 0530 03/11/14 0530  WBC 1.7*  < > 0.9* 0.6* 1.1* 1.8* 3.5*  NEUTROABS 1.2*  --   --   --  0.6* 1.2* 2.7  HGB 8.8*  < > 8.1* 11.2* 8.0* 8.2* 7.6*  HCT 25.3*  < > 23.0* 31.4* 21.9* 22.7* 21.4*  MCV 93.7  < > 91.3  90.0 89.8 89.4 89.5  PLT 34*  < > 47* 25* 23* 14* 53*  < > = values in this interval not displayed. Cardiac Enzymes: No results for input(s): CKTOTAL, CKMB, CKMBINDEX, TROPONINI in the last 168 hours. BNP: BNP (last 3 results) No results for input(s): BNP in the last 8760 hours.  ProBNP (last 3 results) No results for input(s): PROBNP in the last 8760 hours.  CBG: No results for input(s): GLUCAP in the last 168 hours.     SignedCristal Ford  [facility] 03/11/2014, 11:04 AM

## 2014-03-11 NOTE — Progress Notes (Signed)
Patient was stable at time of discharge. I reviewed discharge education with patient and he verbalized understanding with no further questions.

## 2014-03-12 LAB — TYPE AND SCREEN
ABO/RH(D): O POS
Antibody Screen: NEGATIVE
UNIT DIVISION: 0

## 2014-03-16 ENCOUNTER — Ambulatory Visit (HOSPITAL_BASED_OUTPATIENT_CLINIC_OR_DEPARTMENT_OTHER): Payer: Medicare Other | Admitting: Internal Medicine

## 2014-03-16 ENCOUNTER — Ambulatory Visit: Payer: Medicare Other

## 2014-03-16 ENCOUNTER — Other Ambulatory Visit (HOSPITAL_BASED_OUTPATIENT_CLINIC_OR_DEPARTMENT_OTHER): Payer: Medicare Other

## 2014-03-16 ENCOUNTER — Encounter: Payer: Self-pay | Admitting: Internal Medicine

## 2014-03-16 ENCOUNTER — Other Ambulatory Visit: Payer: Medicare Other

## 2014-03-16 ENCOUNTER — Ambulatory Visit (HOSPITAL_BASED_OUTPATIENT_CLINIC_OR_DEPARTMENT_OTHER): Payer: Medicare Other

## 2014-03-16 ENCOUNTER — Telehealth: Payer: Self-pay | Admitting: Internal Medicine

## 2014-03-16 VITALS — BP 125/68 | HR 106 | Temp 97.8°F | Resp 19 | Ht 71.0 in | Wt 151.9 lb

## 2014-03-16 VITALS — BP 115/64 | HR 104 | Temp 97.6°F | Resp 16

## 2014-03-16 DIAGNOSIS — C3431 Malignant neoplasm of lower lobe, right bronchus or lung: Secondary | ICD-10-CM

## 2014-03-16 DIAGNOSIS — C3490 Malignant neoplasm of unspecified part of unspecified bronchus or lung: Secondary | ICD-10-CM

## 2014-03-16 DIAGNOSIS — Z95828 Presence of other vascular implants and grafts: Secondary | ICD-10-CM

## 2014-03-16 DIAGNOSIS — C3432 Malignant neoplasm of lower lobe, left bronchus or lung: Secondary | ICD-10-CM

## 2014-03-16 DIAGNOSIS — C7931 Secondary malignant neoplasm of brain: Secondary | ICD-10-CM | POA: Diagnosis not present

## 2014-03-16 LAB — COMPREHENSIVE METABOLIC PANEL (CC13)
ALBUMIN: 2.4 g/dL — AB (ref 3.5–5.0)
ALT: 79 U/L — ABNORMAL HIGH (ref 0–55)
AST: 60 U/L — ABNORMAL HIGH (ref 5–34)
Alkaline Phosphatase: 305 U/L — ABNORMAL HIGH (ref 40–150)
Anion Gap: 16 mEq/L — ABNORMAL HIGH (ref 3–11)
BUN: 22 mg/dL (ref 7.0–26.0)
CO2: 23 meq/L (ref 22–29)
Calcium: 8.4 mg/dL (ref 8.4–10.4)
Chloride: 99 mEq/L (ref 98–109)
Creatinine: 1.5 mg/dL — ABNORMAL HIGH (ref 0.7–1.3)
EGFR: 43 mL/min/{1.73_m2} — AB (ref >90)
Glucose: 150 mg/dl — ABNORMAL HIGH (ref 70–140)
Potassium: 3.3 mEq/L — ABNORMAL LOW (ref 3.5–5.1)
SODIUM: 137 meq/L (ref 136–145)
TOTAL PROTEIN: 5.8 g/dL — AB (ref 6.4–8.3)
Total Bilirubin: 0.38 mg/dL (ref 0.20–1.20)

## 2014-03-16 LAB — CBC WITH DIFFERENTIAL/PLATELET
BASO%: 0.2 % (ref 0.0–2.0)
Basophils Absolute: 0 10*3/uL (ref 0.0–0.1)
EOS ABS: 0 10*3/uL (ref 0.0–0.5)
EOS%: 0.1 % (ref 0.0–7.0)
HCT: 24.8 % — ABNORMAL LOW (ref 38.4–49.9)
HEMOGLOBIN: 8.4 g/dL — AB (ref 13.0–17.1)
LYMPH%: 3.9 % — AB (ref 14.0–49.0)
MCH: 31 pg (ref 27.2–33.4)
MCHC: 34 g/dL (ref 32.0–36.0)
MCV: 91.1 fL (ref 79.3–98.0)
MONO#: 0.4 10*3/uL (ref 0.1–0.9)
MONO%: 4.9 % (ref 0.0–14.0)
NEUT%: 90.9 % — ABNORMAL HIGH (ref 39.0–75.0)
NEUTROS ABS: 8.2 10*3/uL — AB (ref 1.5–6.5)
PLATELETS: 90 10*3/uL — AB (ref 140–400)
RBC: 2.72 10*6/uL — ABNORMAL LOW (ref 4.20–5.82)
RDW: 15.5 % — AB (ref 11.0–14.6)
WBC: 9 10*3/uL (ref 4.0–10.3)
lymph#: 0.3 10*3/uL — ABNORMAL LOW (ref 0.9–3.3)

## 2014-03-16 MED ORDER — HEPARIN SOD (PORK) LOCK FLUSH 100 UNIT/ML IV SOLN
500.0000 [IU] | Freq: Once | INTRAVENOUS | Status: AC
  Administered 2014-03-16: 500 [IU] via INTRAVENOUS
  Filled 2014-03-16: qty 5

## 2014-03-16 MED ORDER — SODIUM CHLORIDE 0.9 % IJ SOLN
10.0000 mL | INTRAMUSCULAR | Status: DC | PRN
  Administered 2014-03-16: 10 mL via INTRAVENOUS
  Filled 2014-03-16: qty 10

## 2014-03-16 NOTE — Progress Notes (Signed)
[place] [phone]   [phone]  OFFICE PROGRESS NOTE  [name], [place] [address]  DIAGNOSIS: Stage IV ( T2b, N2, M1b) non-small cell lung cancer, adenocarcinoma with areas of squamous differentiation with negative EGFR mutation and negative ALK gene translocation, presented with left lower lobe lung mass in addition to mediastinal lymphadenopathy and brain metastases diagnosed in October of 2014  PRIOR THERAPY: 1) Stereotactic radiotherapy to 3 brain lesions under the care of Dr. Lisbeth Renshaw completed 12/08/2012. 2) Palliative radiotherapy to the left lower lobe lung mass under the care of Dr. Lisbeth Renshaw expected to be completed 01/01/2013. 3) Systemic chemotherapy with carboplatin for AUC of 5 and Alimta 500 mg/M2 every 3 weeks. First dose 12/23/2012. Status post 6 cycles  CURRENT THERAPY: Maintenance chemotherapy with single agent Alimta at 500 mg per meter squared given every 3 weeks. First cycle expected to be given 05/12/2013. Status post 13 cycles.   CHEMOTHERAPY INTENT: Palliative  CURRENT # OF CHEMOTHERAPY CYCLES: 13  CURRENT ANTIEMETICS:Zofran, dexamethasone and Compazine  CURRENT SMOKING STATUS: former smoker  ORAL CHEMOTHERAPY AND CONSENT: None  CURRENT BISPHOSPHONATES USE: None  PAIN MANAGEMENT: 0/10  NARCOTICS INDUCED CONSTIPATION: None  LIVING WILL AND CODE STATUS: ?  INTERVAL HISTORY: [name] 81 y.o. male returns to the clinic today for follow up visit accompanied by his wife and daughter. He was recently admitted to [facility] with significant shortness of breath and pancytopenia. He was treated for pneumonia. The patient required PRBCs transfusion and platelet transfusion during his admission. He also has a right sided thoracentesis with drainage of 630 mL of clear yellow fluid. The final cytology showed no malignant cells. He has been on maintenance chemotherapy with single agent  Alimta for the last 13 cycles. Has been tolerating his treatment fairly well except for the last cycle which was complicated with pneumonia and pancytopenia. He is feeling a little bit better today. The patient denied having any nausea or vomiting. He denied having any fever or chills. He has no chest pain, shortness of breath, cough or hemoptysis. He has no weight loss or night sweats. He denied having any significant back pain. He was supposed to start cycle #14 today.  MEDICAL HISTORY: Past Medical History  Diagnosis Date  . COPD (chronic obstructive pulmonary disease)   . Allergy     allergic rhinitis  . Hx of radiation therapy 12/09/12-01/01/13    lung 37.5Gy  . nscl ca w/ brain mets dx'd 08/2012    Patient has a lung mass which is being evaluated    ALLERGIES:  is allergic to asa and sulfa antibiotics.  MEDICATIONS:  Current Outpatient Prescriptions  Medication Sig Dispense Refill  . acetaminophen (TYLENOL) 500 MG tablet Take 500 mg by mouth every 6 (six) hours as needed for mild pain.     . clobetasol cream (TEMOVATE) 2.87 % Apply 1 application topically daily as needed (for itching).    Marland Kitchen dexamethasone (DECADRON) 4 MG tablet 4 mg by mouth twice a day the day before, day of and day after the chemotherapy every 3 weeks 40 tablet 1  . feeding supplement, ENSURE COMPLETE, (ENSURE COMPLETE) LIQD Take 237 mLs by mouth 3 (three) times daily between meals.    . ferrous sulfate (IRON SUPPLEMENT) 325 (65 FE) MG tablet Take 325 mg by mouth daily with breakfast.    . guaiFENesin (MUCINEX) 600 MG 12 hr tablet Take 600 mg by mouth  at bedtime as needed for cough.    . megestrol (MEGACE) 40 MG tablet Take 1 tablet (40 mg total) by mouth daily. 30 tablet 0  . PEMEtrexed Disodium (ALIMTA IV) Inject into the vein every 21 ( twenty-one) days.    Vladimir Faster Glycol-Propyl Glycol (SYSTANE OP) Place 1 drop into both eyes daily.    . simvastatin (ZOCOR) 10 MG tablet Take 10 mg by mouth at bedtime.    .  Alum & Mag Hydroxide-Simeth (MAGIC MOUTHWASH) SOLN Take 5 mLs by mouth 3 (three) times daily as needed for mouth pain.    . folic acid (FOLVITE) 161 MCG tablet Take 400 mcg by mouth every morning.     No current facility-administered medications for this visit.    SURGICAL HISTORY:  Past Surgical History  Procedure Laterality Date  . Cholecystectomy      REVIEW OF SYSTEMS:  A comprehensive review of systems was negative except for: Constitutional: positive for anorexia and fatigue Swelling of the lower extremities   PHYSICAL EXAMINATION: General appearance: alert, cooperative and no distress Head: Normocephalic, without obvious abnormality, atraumatic Neck: no adenopathy, no JVD, supple, symmetrical, trachea midline and thyroid not enlarged, symmetric, no tenderness/mass/nodules Lymph nodes: Cervical, supraclavicular, and axillary nodes normal. Resp: clear to auscultation bilaterally Back: symmetric, no curvature. ROM normal. No CVA tenderness. Cardio: regular rate and rhythm, S1, S2 normal, no murmur, click, rub or gallop GI: soft, non-tender; bowel sounds normal; no masses,  no organomegaly Extremities: edema 2+ Neurologic: Alert and oriented X 3, normal strength and tone. Normal symmetric reflexes. Normal coordination and gait  ECOG PERFORMANCE STATUS: 1 - Symptomatic but completely ambulatory  Blood pressure 125/68, pulse 106, temperature 97.8 F (36.6 C), temperature source Oral, resp. rate 19, height $RemoveBe'5\' 11"'SAwtBsyWz$  (1.803 m), weight 151 lb 14.4 oz (68.901 kg), SpO2 100 %.  LABORATORY DATA: Lab Results  Component Value Date   WBC 9.0 03/16/2014   HGB 8.4* 03/16/2014   HCT 24.8* 03/16/2014   MCV 91.1 03/16/2014   PLT 90* 03/16/2014      Chemistry      Component Value Date/Time   NA 137 03/16/2014 0958   NA 135 03/05/2014 0409   NA CANCELED 10/13/2012 1149   K 3.3* 03/16/2014 0958   K 3.9 03/05/2014 0409   CL 105 03/05/2014 0409   CO2 23 03/16/2014 0958   CO2 26  03/05/2014 0409   BUN 22.0 03/16/2014 0958   BUN 24* 03/05/2014 0409   BUN CANCELED 10/13/2012 1149   CREATININE 1.5* 03/16/2014 0958   CREATININE 1.37* 03/05/2014 0409      Component Value Date/Time   CALCIUM 8.4 03/16/2014 0958   CALCIUM 8.1* 03/05/2014 0409   ALKPHOS 305* 03/16/2014 0958   ALKPHOS 175* 03/05/2014 0409   AST 60* 03/16/2014 0958   AST 31 03/05/2014 0409   ALT 79* 03/16/2014 0958   ALT 28 03/05/2014 0409   BILITOT 0.38 03/16/2014 0958   BILITOT 1.1 03/05/2014 0409       RADIOGRAPHIC STUDIES: Dg Chest 1 View  03/06/2014   CLINICAL DATA:  Status post right thoracentesis  EXAM: CHEST  1 VIEW  COMPARISON:  03/04/2014  FINDINGS: There is a right chest wall port a catheter with tip in the cavoatrial junction. Normal heart size. Chronic interstitial coarsening is again identified bilaterally. There has been decrease in volume of the right pleural effusion after thoracentesis. No significant pneumothorax identified.  IMPRESSION: 1. No pneumothorax after right thoracentesis.   Electronically Signed  By: Kerby Moors M.D.   On: 03/06/2014 10:37   Dg Chest 2 View  03/04/2014   CLINICAL DATA:  Known lung cancer with shortness of Breath  EXAM: CHEST  2 VIEW  COMPARISON:  02/04/2014  FINDINGS: Cardiac shadow is stable. A right-sided chest wall port is again seen and stable. Stable changes in the right hilum extending toward is a pleura are noted similar to that seen on the prior exam. Diffuse interstitial changes are noted throughout the left lung and stable. A stable right-sided pleural effusion is noted as well. No new focal abnormality is seen.  IMPRESSION: Chronic changes without acute abnormality.   Electronically Signed   By: Inez Catalina M.D.   On: 03/04/2014 12:25   Ct Angio Chest Pe W/cm &/or Wo Cm  03/04/2014   CLINICAL DATA:  Shortness of Breath.  Lung carcinoma.  Anemia  EXAM: CT ANGIOGRAPHY CHEST WITH CONTRAST  TECHNIQUE: Multidetector CT imaging of the chest was  performed using the standard protocol during bolus administration of intravenous contrast. Multiplanar CT image reconstructions and MIPs were obtained to evaluate the vascular anatomy.  CONTRAST:  174mL OMNIPAQUE IOHEXOL 350 MG/ML SOLN  COMPARISON:  Chest radiograph March 04, 2014 and chest CT January 22, 2014  FINDINGS: There is no demonstrable pulmonary embolus. There is no thoracic aortic aneurysm or dissection. There is atherosclerotic change in the aorta.  There is a moderate pleural effusion on the right with right base consolidation. There is underlying emphysema with areas of peripheral pulmonary fibrosis, stable. There is a lobulated mass in superior segment of the right lower lobe measuring 1.8 x 1.7 cm, slightly larger than on the previous study. There is a partially cavitary mass in the superior segment right lower lobe more medially measuring 4.8 by 2.7 cm, stable.  There is a moderate pericardial effusion. There is left ventricular hypertrophy. There is an enlarged sub- carinal lymph node measuring 2.4 x1.2 cm, stable. There is adenopathy in the right hilum, indistinguishable from the right lower lobe partially cavitated mass. No new lymph node prominence is seen.  In the visualized upper abdomen, there is hepatic steatosis.  There are no blastic or lytic bone lesions. Thyroid appears unremarkable.  Review of the MIP images confirms the above findings.  IMPRESSION: No demonstrable pulmonary embolus. Moderate right effusion. Partially cavitary mass right lower lobe, stable. A smaller mass also in the superior segment right lower lobe has become slightly larger compared to the previous examination, currently measuring 1.8 x 1.7 cm. There is underlying interstitial fibrosis and emphysema. Stable lymph node prominence the right hilar and subcarinal regions.   Electronically Signed   By: Lowella Grip III M.D.   On: 03/04/2014 15:59   US Thoracentesis Asp Pleural Space W/img Guide  03/06/2014    CLINICAL DATA:  Shortness of breath. Hx of right sided lung cancer. Right pleural effusion. Request for diagnostic and therapeutic thoracentesis  EXAM: ULTRASOUND GUIDED RIGHT THORACENTESIS  COMPARISON:  None  PROCEDURE: An ultrasound guided thoracentesis was thoroughly discussed with the patient and questions answered. The benefits, risks, alternatives and complications were also discussed. The patient understands and wishes to proceed with the procedure. Written consent was obtained.  Ultrasound was performed to localize and mark an adequate pocket of fluid in the right chest. The area was then prepped and draped in the normal sterile fashion. 1% Lidocaine was used for local anesthesia. Under ultrasound guidance a 19 gauge Yueh catheter was introduced. Thoracentesis was performed. The catheter was  removed and a dressing applied.  COMPLICATIONS: None immediate  FINDINGS: A total of approximately 630 mL of clear yellow fluid was removed. A fluid sample wassent for laboratory analysis.  IMPRESSION: Successful ultrasound guided right thoracentesis yielding 630 mL of pleural fluid.  Read by Ascencion Dike PA-C   Electronically Signed   By: Jerilynn Mages.  Shick M.D.   On: 03/06/2014 10:12   ASSESSMENT AND PLAN: this is a very pleasant 81 years old white male with metastatic non-small cell lung cancer, adenocarcinoma with brain metastasis status post stereotactic radiotherapy to the brain lesions followed by palliative radiotherapy to the left lower lobe lung mass. The patient is currently undergoing systemic chemotherapy with carboplatin and Alimta status post 6 cycles with stable disease and he is currently undergoing maintenance chemotherapy with single agent Alimta status post 13 cycles. The patient is still recovering from the recent admission. He still has low platelets count but improved. I recommended for the patient to delay the start of cycle #14 by 1 week to give him more time to recover from his recent admission and  pneumonia. He would come back for follow-up visit in 4 weeks with the start of cycle #15. He was advised to call immediately if he has any concerning symptoms in the interval. The patient voices understanding of current disease status and treatment options and is in agreement with the current care plan.  All questions were answered. The patient knows to call the clinic with any problems, questions or concerns. We can certainly see the patient much sooner if necessary.  Disclaimer: This note was dictated with voice recognition software. Similar sounding words can inadvertently be transcribed and may not be corrected upon review.

## 2014-03-16 NOTE — Telephone Encounter (Signed)
gv adn printed appt sched and avs for pt for March....sed added tx.

## 2014-03-23 ENCOUNTER — Other Ambulatory Visit: Payer: Self-pay | Admitting: Physician Assistant

## 2014-03-23 DIAGNOSIS — C3431 Malignant neoplasm of lower lobe, right bronchus or lung: Secondary | ICD-10-CM

## 2014-03-24 ENCOUNTER — Ambulatory Visit (HOSPITAL_COMMUNITY)
Admission: RE | Admit: 2014-03-24 | Discharge: 2014-03-24 | Disposition: A | Discharge status: Home / Self Care | Payer: Medicare Other | Source: Ambulatory Visit | Attending: Internal Medicine | Admitting: Internal Medicine

## 2014-03-24 ENCOUNTER — Other Ambulatory Visit: Payer: Self-pay | Admitting: *Deleted

## 2014-03-24 ENCOUNTER — Other Ambulatory Visit (HOSPITAL_BASED_OUTPATIENT_CLINIC_OR_DEPARTMENT_OTHER): Payer: Medicare Other

## 2014-03-24 ENCOUNTER — Ambulatory Visit: Payer: Medicare Other

## 2014-03-24 ENCOUNTER — Other Ambulatory Visit: Payer: Medicare Other

## 2014-03-24 ENCOUNTER — Other Ambulatory Visit: Payer: Self-pay | Admitting: Medical Oncology

## 2014-03-24 DIAGNOSIS — C3431 Malignant neoplasm of lower lobe, right bronchus or lung: Secondary | ICD-10-CM

## 2014-03-24 DIAGNOSIS — D649 Anemia, unspecified: Secondary | ICD-10-CM | POA: Diagnosis not present

## 2014-03-24 DIAGNOSIS — C3430 Malignant neoplasm of lower lobe, unspecified bronchus or lung: Secondary | ICD-10-CM | POA: Insufficient documentation

## 2014-03-24 DIAGNOSIS — Z95828 Presence of other vascular implants and grafts: Secondary | ICD-10-CM

## 2014-03-24 LAB — CBC WITH DIFFERENTIAL/PLATELET
BASO%: 0.1 % (ref 0.0–2.0)
Basophils Absolute: 0 10*3/uL (ref 0.0–0.1)
EOS ABS: 0 10*3/uL (ref 0.0–0.5)
EOS%: 0 % (ref 0.0–7.0)
HCT: 21.3 % — ABNORMAL LOW (ref 38.4–49.9)
HGB: 7.5 g/dL — ABNORMAL LOW (ref 13.0–17.1)
LYMPH#: 0.5 10*3/uL — AB (ref 0.9–3.3)
LYMPH%: 5.5 % — AB (ref 14.0–49.0)
MCH: 31.9 pg (ref 27.2–33.4)
MCHC: 35.2 g/dL (ref 32.0–36.0)
MCV: 90.6 fL (ref 79.3–98.0)
MONO#: 0.6 10*3/uL (ref 0.1–0.9)
MONO%: 7.4 % (ref 0.0–14.0)
NEUT%: 87 % — ABNORMAL HIGH (ref 39.0–75.0)
NEUTROS ABS: 7.1 10*3/uL — AB (ref 1.5–6.5)
PLATELETS: 147 10*3/uL (ref 140–400)
RBC: 2.35 10*6/uL — ABNORMAL LOW (ref 4.20–5.82)
RDW: 16.2 % — ABNORMAL HIGH (ref 11.0–14.6)
WBC: 8.1 10*3/uL (ref 4.0–10.3)

## 2014-03-24 LAB — COMPREHENSIVE METABOLIC PANEL (CC13)
ALT: 34 U/L (ref 0–55)
ANION GAP: 11 meq/L (ref 3–11)
AST: 30 U/L (ref 5–34)
Albumin: 2.5 g/dL — ABNORMAL LOW (ref 3.5–5.0)
Alkaline Phosphatase: 239 U/L — ABNORMAL HIGH (ref 40–150)
BUN: 20.1 mg/dL (ref 7.0–26.0)
CHLORIDE: 102 meq/L (ref 98–109)
CO2: 23 meq/L (ref 22–29)
CREATININE: 1.4 mg/dL — AB (ref 0.7–1.3)
Calcium: 8.3 mg/dL — ABNORMAL LOW (ref 8.4–10.4)
EGFR: 47 mL/min/{1.73_m2} — ABNORMAL LOW (ref >90)
Glucose: 92 mg/dl (ref 70–140)
Potassium: 3.2 mEq/L — ABNORMAL LOW (ref 3.5–5.1)
Sodium: 136 mEq/L (ref 136–145)
TOTAL PROTEIN: 5.7 g/dL — AB (ref 6.4–8.3)
Total Bilirubin: 0.52 mg/dL (ref 0.20–1.20)

## 2014-03-24 MED ORDER — SODIUM CHLORIDE 0.9 % IJ SOLN
10.0000 mL | INTRAMUSCULAR | Status: DC | PRN
  Administered 2014-03-24: 10 mL via INTRAVENOUS
  Filled 2014-03-24: qty 10

## 2014-03-24 NOTE — Progress Notes (Signed)
1130 Per Dr.Mohamed/Sara RN; hold treatment today and draw a type and cross due to pt's HGB 7.5. 1210 Type and cross drawn and blue band in place. Pt to RTC on 03/27/14 for transfusion.

## 2014-03-24 NOTE — Patient Instructions (Signed)

## 2014-03-26 ENCOUNTER — Other Ambulatory Visit: Payer: Medicare Other

## 2014-03-27 ENCOUNTER — Ambulatory Visit (HOSPITAL_BASED_OUTPATIENT_CLINIC_OR_DEPARTMENT_OTHER): Payer: Medicare Other

## 2014-03-27 VITALS — BP 116/61 | HR 92 | Temp 97.9°F | Resp 24

## 2014-03-27 DIAGNOSIS — D649 Anemia, unspecified: Secondary | ICD-10-CM | POA: Diagnosis not present

## 2014-03-27 DIAGNOSIS — C3430 Malignant neoplasm of lower lobe, unspecified bronchus or lung: Secondary | ICD-10-CM | POA: Diagnosis not present

## 2014-03-27 LAB — PREPARE RBC (CROSSMATCH)

## 2014-03-27 MED ORDER — SODIUM CHLORIDE 0.9 % IV SOLN
250.0000 mL | Freq: Once | INTRAVENOUS | Status: AC
  Administered 2014-03-27: 250 mL via INTRAVENOUS

## 2014-03-27 MED ORDER — HEPARIN SOD (PORK) LOCK FLUSH 100 UNIT/ML IV SOLN
500.0000 [IU] | Freq: Every day | INTRAVENOUS | Status: AC | PRN
  Administered 2014-03-27: 500 [IU]
  Filled 2014-03-27: qty 5

## 2014-03-27 MED ORDER — SODIUM CHLORIDE 0.9 % IJ SOLN
10.0000 mL | INTRAMUSCULAR | Status: AC | PRN
  Administered 2014-03-27: 10 mL
  Filled 2014-03-27: qty 10

## 2014-03-27 NOTE — Patient Instructions (Signed)

## 2014-03-28 LAB — TYPE AND SCREEN
ABO/RH(D): O POS
Antibody Screen: NEGATIVE
UNIT DIVISION: 0
Unit division: 0

## 2014-03-29 ENCOUNTER — Ambulatory Visit: Payer: Self-pay | Admitting: Radiation Oncology

## 2014-03-29 ENCOUNTER — Ambulatory Visit: Payer: Medicare Other

## 2014-04-12 ENCOUNTER — Encounter: Payer: Self-pay | Admitting: Pharmacist

## 2014-04-13 ENCOUNTER — Ambulatory Visit (HOSPITAL_BASED_OUTPATIENT_CLINIC_OR_DEPARTMENT_OTHER): Payer: Medicare Other

## 2014-04-13 ENCOUNTER — Other Ambulatory Visit (HOSPITAL_BASED_OUTPATIENT_CLINIC_OR_DEPARTMENT_OTHER): Payer: Medicare Other

## 2014-04-13 ENCOUNTER — Telehealth: Payer: Self-pay | Admitting: Physician Assistant

## 2014-04-13 ENCOUNTER — Ambulatory Visit (HOSPITAL_BASED_OUTPATIENT_CLINIC_OR_DEPARTMENT_OTHER): Payer: Medicare Other | Admitting: Physician Assistant

## 2014-04-13 ENCOUNTER — Telehealth: Payer: Self-pay | Admitting: *Deleted

## 2014-04-13 ENCOUNTER — Encounter: Payer: Self-pay | Admitting: Physician Assistant

## 2014-04-13 ENCOUNTER — Ambulatory Visit: Payer: Medicare Other

## 2014-04-13 ENCOUNTER — Encounter: Payer: Self-pay | Admitting: *Deleted

## 2014-04-13 VITALS — BP 129/73 | HR 108 | Temp 97.9°F | Resp 18 | Ht 71.0 in | Wt 161.2 lb

## 2014-04-13 DIAGNOSIS — C3431 Malignant neoplasm of lower lobe, right bronchus or lung: Secondary | ICD-10-CM

## 2014-04-13 DIAGNOSIS — D61818 Other pancytopenia: Secondary | ICD-10-CM

## 2014-04-13 DIAGNOSIS — C7931 Secondary malignant neoplasm of brain: Secondary | ICD-10-CM | POA: Diagnosis not present

## 2014-04-13 DIAGNOSIS — Z95828 Presence of other vascular implants and grafts: Secondary | ICD-10-CM

## 2014-04-13 DIAGNOSIS — R530 Neoplastic (malignant) related fatigue: Secondary | ICD-10-CM

## 2014-04-13 DIAGNOSIS — Z5111 Encounter for antineoplastic chemotherapy: Secondary | ICD-10-CM | POA: Diagnosis not present

## 2014-04-13 LAB — COMPREHENSIVE METABOLIC PANEL (CC13)
ALT: 17 U/L (ref 0–55)
AST: 29 U/L (ref 5–34)
Albumin: 2.6 g/dL — ABNORMAL LOW (ref 3.5–5.0)
Alkaline Phosphatase: 195 U/L — ABNORMAL HIGH (ref 40–150)
Anion Gap: 11 mEq/L (ref 3–11)
BUN: 16.2 mg/dL (ref 7.0–26.0)
CALCIUM: 8.5 mg/dL (ref 8.4–10.4)
CO2: 22 meq/L (ref 22–29)
Chloride: 105 mEq/L (ref 98–109)
Creatinine: 1.6 mg/dL — ABNORMAL HIGH (ref 0.7–1.3)
EGFR: 41 mL/min/{1.73_m2} — ABNORMAL LOW (ref >90)
Glucose: 115 mg/dl (ref 70–140)
Potassium: 3.3 mEq/L — ABNORMAL LOW (ref 3.5–5.1)
SODIUM: 138 meq/L (ref 136–145)
Total Bilirubin: 0.83 mg/dL (ref 0.20–1.20)
Total Protein: 6 g/dL — ABNORMAL LOW (ref 6.4–8.3)

## 2014-04-13 LAB — CBC WITH DIFFERENTIAL/PLATELET
BASO%: 1.2 % (ref 0.0–2.0)
Basophils Absolute: 0.1 10*3/uL (ref 0.0–0.1)
EOS%: 0.3 % (ref 0.0–7.0)
Eosinophils Absolute: 0 10*3/uL (ref 0.0–0.5)
HCT: 27 % — ABNORMAL LOW (ref 38.4–49.9)
HGB: 9.2 g/dL — ABNORMAL LOW (ref 13.0–17.1)
LYMPH#: 0.3 10*3/uL — AB (ref 0.9–3.3)
LYMPH%: 4 % — ABNORMAL LOW (ref 14.0–49.0)
MCH: 32.5 pg (ref 27.2–33.4)
MCHC: 34 g/dL (ref 32.0–36.0)
MCV: 95.5 fL (ref 79.3–98.0)
MONO#: 0.3 10*3/uL (ref 0.1–0.9)
MONO%: 3.9 % (ref 0.0–14.0)
NEUT#: 6.9 10*3/uL — ABNORMAL HIGH (ref 1.5–6.5)
NEUT%: 90.6 % — ABNORMAL HIGH (ref 39.0–75.0)
PLATELETS: 161 10*3/uL (ref 140–400)
RBC: 2.82 10*6/uL — AB (ref 4.20–5.82)
RDW: 21.3 % — ABNORMAL HIGH (ref 11.0–14.6)
WBC: 7.6 10*3/uL (ref 4.0–10.3)

## 2014-04-13 MED ORDER — SODIUM CHLORIDE 0.9 % IV SOLN
400.0000 mg/m2 | Freq: Once | INTRAVENOUS | Status: AC
  Administered 2014-04-13: 800 mg via INTRAVENOUS
  Filled 2014-04-13: qty 32

## 2014-04-13 MED ORDER — FAMOTIDINE IN NACL 20-0.9 MG/50ML-% IV SOLN
20.0000 mg | Freq: Once | INTRAVENOUS | Status: AC
  Administered 2014-04-13: 20 mg via INTRAVENOUS

## 2014-04-13 MED ORDER — SODIUM CHLORIDE 0.9 % IV SOLN
Freq: Once | INTRAVENOUS | Status: AC
  Administered 2014-04-13: 13:00:00 via INTRAVENOUS

## 2014-04-13 MED ORDER — SODIUM CHLORIDE 0.9 % IJ SOLN
10.0000 mL | INTRAMUSCULAR | Status: DC | PRN
  Administered 2014-04-13: 10 mL via INTRAVENOUS
  Filled 2014-04-13: qty 10

## 2014-04-13 MED ORDER — SODIUM CHLORIDE 0.9 % IJ SOLN
10.0000 mL | INTRAMUSCULAR | Status: DC | PRN
  Administered 2014-04-13: 10 mL
  Filled 2014-04-13: qty 10

## 2014-04-13 MED ORDER — HEPARIN SOD (PORK) LOCK FLUSH 100 UNIT/ML IV SOLN
500.0000 [IU] | Freq: Once | INTRAVENOUS | Status: AC | PRN
  Administered 2014-04-13: 500 [IU]
  Filled 2014-04-13: qty 5

## 2014-04-13 MED ORDER — FAMOTIDINE IN NACL 20-0.9 MG/50ML-% IV SOLN
INTRAVENOUS | Status: AC
  Filled 2014-04-13: qty 50

## 2014-04-13 MED ORDER — DEXAMETHASONE SODIUM PHOSPHATE 100 MG/10ML IJ SOLN
Freq: Once | INTRAMUSCULAR | Status: AC
  Administered 2014-04-13: 13:00:00 via INTRAVENOUS
  Filled 2014-04-13: qty 4

## 2014-04-13 MED ORDER — CYANOCOBALAMIN 1000 MCG/ML IJ SOLN
1000.0000 ug | Freq: Once | INTRAMUSCULAR | Status: AC
  Administered 2014-04-13: 1000 ug via INTRAMUSCULAR

## 2014-04-13 MED ORDER — CYANOCOBALAMIN 1000 MCG/ML IJ SOLN
INTRAMUSCULAR | Status: AC
  Filled 2014-04-13: qty 1

## 2014-04-13 NOTE — Telephone Encounter (Signed)
Per staff message and POF I have scheduled appts. Advised scheduler of appts. JMW  

## 2014-04-13 NOTE — Patient Instructions (Signed)

## 2014-04-13 NOTE — Progress Notes (Addendum)
[place] [phone]   [phone]  OFFICE PROGRESS NOTE  [name], [place] [address]  DIAGNOSIS: Stage IV ( T2b, N2, M1b) non-small cell lung cancer, adenocarcinoma with areas of squamous differentiation with negative EGFR mutation and negative ALK gene translocation, presented with left lower lobe lung mass in addition to mediastinal lymphadenopathy and brain metastases diagnosed in October of 2014  PRIOR THERAPY: 1) Stereotactic radiotherapy to 3 brain lesions under the care of Dr. Lisbeth Renshaw completed 12/08/2012. 2) Palliative radiotherapy to the left lower lobe lung mass under the care of Dr. Lisbeth Renshaw expected to be completed 01/01/2013. 3) Systemic chemotherapy with carboplatin for AUC of 5 and Alimta 500 mg/M2 every 3 weeks. First dose 12/23/2012. Status post 6 cycles  CURRENT THERAPY: Maintenance chemotherapy with single agent Alimta at 500 mg per meter squared given every 3 weeks. First cycle expected to be given 05/12/2013. Status post 14 cycles. Starting with cycle #15, Alimta was reduced to 400 mg/M2   CHEMOTHERAPY INTENT: Palliative  CURRENT # OF CHEMOTHERAPY CYCLES: 15  CURRENT ANTIEMETICS:Zofran, dexamethasone and Compazine  CURRENT SMOKING STATUS: former smoker  ORAL CHEMOTHERAPY AND CONSENT: None  CURRENT BISPHOSPHONATES USE: None  PAIN MANAGEMENT: 0/10  NARCOTICS INDUCED CONSTIPATION: None  LIVING WILL AND CODE STATUS: ?  INTERVAL HISTORY: SRINIVAS LIPPMAN 81 y.o. male returns to the clinic today for follow up visit accompanied by his wife. He has been on maintenance chemotherapy with single agent Alimta for the last 13 cycles. Has been tolerating his treatment fairly well except for the last cycle which was complicated with pneumonia and pancytopenia. He is feeling a little bit better today. The patient denied having any nausea or vomiting. He denied having any fever or chills. He has no chest pain,  shortness of breath, cough or hemoptysis. He has no weight loss or night sweats. He denied having any significant back pain. He was supposed to start cycle #15 today.  MEDICAL HISTORY: Past Medical History  Diagnosis Date  . COPD (chronic obstructive pulmonary disease)   . Allergy     allergic rhinitis  . Hx of radiation therapy 12/09/12-01/01/13    lung 37.5Gy  . nscl ca w/ brain mets dx'd 08/2012    Patient has a lung mass which is being evaluated    ALLERGIES:  is allergic to asa and sulfa antibiotics.  MEDICATIONS:  Current Outpatient Prescriptions  Medication Sig Dispense Refill  . acetaminophen (TYLENOL) 500 MG tablet Take 500 mg by mouth every 6 (six) hours as needed for mild pain.     Marland Kitchen Alum & Mag Hydroxide-Simeth (MAGIC MOUTHWASH) SOLN Take 5 mLs by mouth 3 (three) times daily as needed for mouth pain.    . clobetasol cream (TEMOVATE) 9.47 % Apply 1 application topically daily as needed (for itching).    Marland Kitchen dexamethasone (DECADRON) 4 MG tablet 4 mg by mouth twice a day the day before, day of and day after the chemotherapy every 3 weeks 40 tablet 1  . feeding supplement, ENSURE COMPLETE, (ENSURE COMPLETE) LIQD Take 237 mLs by mouth 3 (three) times daily between meals.    . ferrous sulfate (IRON SUPPLEMENT) 325 (65 FE) MG tablet Take 325 mg by mouth daily with breakfast.    . folic acid (FOLVITE) 096 MCG tablet Take 400 mcg by mouth every morning.    Marland Kitchen guaiFENesin (MUCINEX) 600 MG 12 hr tablet Take 600 mg by mouth at bedtime as needed  for cough.    . megestrol (MEGACE) 40 MG tablet Take 1 tablet (40 mg total) by mouth daily. 30 tablet 0  . PEMEtrexed Disodium (ALIMTA IV) Inject into the vein every 21 ( twenty-one) days.    Vladimir Faster Glycol-Propyl Glycol (SYSTANE OP) Place 1 drop into both eyes daily.    . simvastatin (ZOCOR) 10 MG tablet Take 10 mg by mouth at bedtime.     No current facility-administered medications for this visit.   Facility-Administered Medications Ordered  in Other Visits  Medication Dose Route Frequency Provider Last Rate Last Dose  . sodium chloride 0.9 % injection 10 mL  10 mL Intracatheter PRN Curt Bears, MD   10 mL at 04/13/14 1341    SURGICAL HISTORY:  Past Surgical History  Procedure Laterality Date  . Cholecystectomy      REVIEW OF SYSTEMS:  A comprehensive review of systems was negative except for: Constitutional: positive for anorexia and fatigue Swelling of the lower extremities   PHYSICAL EXAMINATION: General appearance: alert, cooperative and no distress Head: Normocephalic, without obvious abnormality, atraumatic Neck: no adenopathy, no JVD, supple, symmetrical, trachea midline and thyroid not enlarged, symmetric, no tenderness/mass/nodules Lymph nodes: Cervical, supraclavicular, and axillary nodes normal. Resp: clear to auscultation bilaterally Back: symmetric, no curvature. ROM normal. No CVA tenderness. Cardio: regular rate and rhythm, S1, S2 normal, no murmur, click, rub or gallop GI: soft, non-tender; bowel sounds normal; no masses,  no organomegaly Extremities: edema 2+ Neurologic: Alert and oriented X 3, normal strength and tone. Normal symmetric reflexes. Normal coordination and gait  ECOG PERFORMANCE STATUS: 1 - Symptomatic but completely ambulatory  Blood pressure 129/73, pulse 108, temperature 97.9 F (36.6 C), temperature source Oral, resp. rate 18, height _0  (1.803 m), weight 161 lb 3.2 oz (73.12 kg), SpO2 97 %.  LABORATORY DATA: Lab Results  Component Value Date   WBC 7.6 04/13/2014   HGB 9.2* 04/13/2014   HCT 27.0* 04/13/2014   MCV 95.5 04/13/2014   PLT 161 04/13/2014      Chemistry      Component Value Date/Time   NA 138 04/13/2014 1134   NA 135 03/05/2014 0409   NA CANCELED 10/13/2012 1149   K 3.3* 04/13/2014 1134   K 3.9 03/05/2014 0409   CL 105 03/05/2014 0409   CO2 22 04/13/2014 1134   CO2 26 03/05/2014 0409   BUN 16.2 04/13/2014 1134   BUN 24* 03/05/2014 0409   BUN  CANCELED 10/13/2012 1149   CREATININE 1.6* 04/13/2014 1134   CREATININE 1.37* 03/05/2014 0409      Component Value Date/Time   CALCIUM 8.5 04/13/2014 1134   CALCIUM 8.1* 03/05/2014 0409   ALKPHOS 195* 04/13/2014 1134   ALKPHOS 175* 03/05/2014 0409   AST 29 04/13/2014 1134   AST 31 03/05/2014 0409   ALT 17 04/13/2014 1134   ALT 28 03/05/2014 0409   BILITOT 0.83 04/13/2014 1134   BILITOT 1.1 03/05/2014 0409       RADIOGRAPHIC STUDIES: No results found. ASSESSMENT AND PLAN: this is a very pleasant 81 years old white male with metastatic non-small cell lung cancer, adenocarcinoma with brain metastasis status post stereotactic radiotherapy to the brain lesions followed by palliative radiotherapy to the left lower lobe lung mass. The patient is currently undergoing systemic chemotherapy with carboplatin and Alimta status post 6 cycles with stable disease and he is currently undergoing maintenance chemotherapy with single agent Alimta status post 14 cycles. The patient  Was discussed with and also  seen by Dr. Julien Nordmann. He'll proceed with cycle #15 of his maintenance chemotherapy with single agent Alimta however, we will decrease the Alimta to 400 mg/m going forward. He CT angiography on 03/04/2014, we will plan to rescan him approximately after cycle #16. He was advised to call immediately if he has any concerning symptoms in the interval. The patient voices understanding of current disease status and treatment options and is in agreement with the current care plan.  All questions were answered. The patient knows to call the clinic with any problems, questions or concerns. We can certainly see the patient much sooner if necessary.  Carlton Adam PA-C  ADDENDUM: Hematology/Oncology Attending: I had a face to face encounter with the patient. I recommended his care plan. This is a very pleasant 81 years old white male with metastatic non-small cell lung cancer, adenocarcinoma with  currently undergoing maintenance chemotherapy with single agent Alimta status post 14 cycles. The patient is tolerating his treatment well except for recent pancytopenia after cycle #14. Has been off treatment for few weeks. I had a lengthy discussion with the patient and his wife about his condition. I gave the patient the option of continuing his maintenance chemotherapy but reduce the dose of Alimta to 400 MG/M2 starting from cycle #15 versus continuous observation and close monitoring. The patient would like to continue his maintenance therapy with reduced dose. He will start cycle #15 today as a scheduled and he would come back for follow-up visit in 3 weeks with the next cycle of his treatment. He was advised to call immediately if he has any concerning symptoms in the interval.  Disclaimer: This note was dictated with voice recognition software. Similar sounding words can inadvertently be transcribed and may not be corrected upon review. Eilleen Kempf., MD 04/20/2014

## 2014-04-13 NOTE — Patient Instructions (Addendum)
[facility] Discharge Instructions for Patients Receiving Chemotherapy  Today you received the following chemotherapy agents Alimta.  To help prevent nausea and vomiting after your treatment, we encourage you to take your nausea medication as prescribed.   If you develop nausea and vomiting that is not controlled by your nausea medication, call the clinic.   BELOW ARE SYMPTOMS THAT SHOULD BE REPORTED IMMEDIATELY:  *FEVER GREATER THAN 100.5 F  *CHILLS WITH OR WITHOUT FEVER  NAUSEA AND VOMITING THAT IS NOT CONTROLLED WITH YOUR NAUSEA MEDICATION  *UNUSUAL SHORTNESS OF BREATH  *UNUSUAL BRUISING OR BLEEDING  TENDERNESS IN MOUTH AND THROAT WITH OR WITHOUT PRESENCE OF ULCERS  *URINARY PROBLEMS  *BOWEL PROBLEMS  UNUSUAL RASH Items with * indicate a potential emergency and should be followed up as soon as possible.  Feel free to call the clinic you have any questions or concerns. The clinic phone number is [phone].  Please show the CHEMO ALERT CARD at check-in to the Emergency Department and triage nurse.   

## 2014-04-13 NOTE — Telephone Encounter (Signed)
Pt confirmed labs/ov per 03/29 POF, gave pt AVS and Calendar.... KJ, sent msg to add chemo

## 2014-04-13 NOTE — Progress Notes (Signed)
Ok to treat with Creatinine 1.6 per Awilda Metro, PA

## 2014-04-15 ENCOUNTER — Other Ambulatory Visit: Payer: Self-pay | Admitting: Family Medicine

## 2014-04-15 NOTE — Telephone Encounter (Signed)
Last seen 08/24/13 [name]  Last lipid 09/08/12

## 2014-04-17 NOTE — Patient Instructions (Signed)
Follow-up in 3 weeks prior to next scheduled cycle of maintenance chemotherapy

## 2014-04-18 ENCOUNTER — Other Ambulatory Visit: Payer: Self-pay | Admitting: Family Medicine

## 2014-04-19 ENCOUNTER — Other Ambulatory Visit (HOSPITAL_COMMUNITY): Payer: Self-pay

## 2014-04-19 ENCOUNTER — Other Ambulatory Visit: Payer: Self-pay

## 2014-04-19 ENCOUNTER — Encounter (HOSPITAL_COMMUNITY): Payer: Self-pay

## 2014-04-19 ENCOUNTER — Inpatient Hospital Stay (HOSPITAL_COMMUNITY)
Admission: EM | Admit: 2014-04-19 | Discharge: 2014-05-02 | DRG: 602 | Disposition: A | Discharge status: Home / Self Care | Payer: Medicare Other | Attending: Internal Medicine | Admitting: Internal Medicine

## 2014-04-19 ENCOUNTER — Emergency Department (HOSPITAL_COMMUNITY): Payer: Medicare Other

## 2014-04-19 DIAGNOSIS — I959 Hypotension, unspecified: Secondary | ICD-10-CM | POA: Diagnosis present

## 2014-04-19 DIAGNOSIS — C3490 Malignant neoplasm of unspecified part of unspecified bronchus or lung: Secondary | ICD-10-CM | POA: Diagnosis not present

## 2014-04-19 DIAGNOSIS — N179 Acute kidney failure, unspecified: Secondary | ICD-10-CM | POA: Diagnosis not present

## 2014-04-19 DIAGNOSIS — B370 Candidal stomatitis: Secondary | ICD-10-CM | POA: Diagnosis present

## 2014-04-19 DIAGNOSIS — Z888 Allergy status to other drugs, medicaments and biological substances status: Secondary | ICD-10-CM

## 2014-04-19 DIAGNOSIS — L03119 Cellulitis of unspecified part of limb: Secondary | ICD-10-CM | POA: Diagnosis not present

## 2014-04-19 DIAGNOSIS — I313 Pericardial effusion (noninflammatory): Secondary | ICD-10-CM | POA: Diagnosis not present

## 2014-04-19 DIAGNOSIS — N183 Chronic kidney disease, stage 3 (moderate): Secondary | ICD-10-CM | POA: Diagnosis present

## 2014-04-19 DIAGNOSIS — R569 Unspecified convulsions: Secondary | ICD-10-CM | POA: Insufficient documentation

## 2014-04-19 DIAGNOSIS — T451X5A Adverse effect of antineoplastic and immunosuppressive drugs, initial encounter: Secondary | ICD-10-CM | POA: Diagnosis present

## 2014-04-19 DIAGNOSIS — R748 Abnormal levels of other serum enzymes: Secondary | ICD-10-CM | POA: Diagnosis not present

## 2014-04-19 DIAGNOSIS — C3431 Malignant neoplasm of lower lobe, right bronchus or lung: Secondary | ICD-10-CM | POA: Diagnosis not present

## 2014-04-19 DIAGNOSIS — Z9049 Acquired absence of other specified parts of digestive tract: Secondary | ICD-10-CM | POA: Diagnosis present

## 2014-04-19 DIAGNOSIS — R0600 Dyspnea, unspecified: Secondary | ICD-10-CM | POA: Diagnosis not present

## 2014-04-19 DIAGNOSIS — D61810 Antineoplastic chemotherapy induced pancytopenia: Secondary | ICD-10-CM | POA: Diagnosis not present

## 2014-04-19 DIAGNOSIS — R0602 Shortness of breath: Secondary | ICD-10-CM | POA: Diagnosis not present

## 2014-04-19 DIAGNOSIS — L03115 Cellulitis of right lower limb: Secondary | ICD-10-CM | POA: Diagnosis present

## 2014-04-19 DIAGNOSIS — Z882 Allergy status to sulfonamides status: Secondary | ICD-10-CM | POA: Diagnosis not present

## 2014-04-19 DIAGNOSIS — J449 Chronic obstructive pulmonary disease, unspecified: Secondary | ICD-10-CM | POA: Diagnosis present

## 2014-04-19 DIAGNOSIS — I319 Disease of pericardium, unspecified: Secondary | ICD-10-CM | POA: Diagnosis not present

## 2014-04-19 DIAGNOSIS — Z6822 Body mass index (BMI) 22.0-22.9, adult: Secondary | ICD-10-CM

## 2014-04-19 DIAGNOSIS — Z87891 Personal history of nicotine dependence: Secondary | ICD-10-CM | POA: Diagnosis not present

## 2014-04-19 DIAGNOSIS — E43 Unspecified severe protein-calorie malnutrition: Secondary | ICD-10-CM | POA: Diagnosis present

## 2014-04-19 DIAGNOSIS — E876 Hypokalemia: Secondary | ICD-10-CM | POA: Diagnosis not present

## 2014-04-19 DIAGNOSIS — R7989 Other specified abnormal findings of blood chemistry: Secondary | ICD-10-CM | POA: Diagnosis not present

## 2014-04-19 DIAGNOSIS — Z79899 Other long term (current) drug therapy: Secondary | ICD-10-CM

## 2014-04-19 DIAGNOSIS — R000 Tachycardia, unspecified: Secondary | ICD-10-CM | POA: Diagnosis not present

## 2014-04-19 DIAGNOSIS — C7931 Secondary malignant neoplasm of brain: Secondary | ICD-10-CM | POA: Diagnosis not present

## 2014-04-19 DIAGNOSIS — L03116 Cellulitis of left lower limb: Secondary | ICD-10-CM | POA: Diagnosis not present

## 2014-04-19 DIAGNOSIS — J309 Allergic rhinitis, unspecified: Secondary | ICD-10-CM | POA: Diagnosis not present

## 2014-04-19 DIAGNOSIS — Z923 Personal history of irradiation: Secondary | ICD-10-CM

## 2014-04-19 DIAGNOSIS — N189 Chronic kidney disease, unspecified: Secondary | ICD-10-CM | POA: Diagnosis not present

## 2014-04-19 DIAGNOSIS — I3139 Other pericardial effusion (noninflammatory): Secondary | ICD-10-CM | POA: Diagnosis present

## 2014-04-19 DIAGNOSIS — R945 Abnormal results of liver function studies: Secondary | ICD-10-CM

## 2014-04-19 DIAGNOSIS — R740 Nonspecific elevation of levels of transaminase and lactic acid dehydrogenase [LDH]: Secondary | ICD-10-CM | POA: Diagnosis not present

## 2014-04-19 DIAGNOSIS — R609 Edema, unspecified: Secondary | ICD-10-CM

## 2014-04-19 DIAGNOSIS — R600 Localized edema: Secondary | ICD-10-CM | POA: Diagnosis not present

## 2014-04-19 LAB — CBC WITH DIFFERENTIAL/PLATELET
Basophils Absolute: 0 10*3/uL (ref 0.0–0.1)
Basophils Relative: 0 % (ref 0–1)
EOS ABS: 0 10*3/uL (ref 0.0–0.7)
Eosinophils Relative: 0 % (ref 0–5)
HCT: 25.7 % — ABNORMAL LOW (ref 39.0–52.0)
HEMOGLOBIN: 8.7 g/dL — AB (ref 13.0–17.0)
LYMPHS ABS: 0.3 10*3/uL — AB (ref 0.7–4.0)
LYMPHS PCT: 3 % — AB (ref 12–46)
MCH: 32.8 pg (ref 26.0–34.0)
MCHC: 33.9 g/dL (ref 30.0–36.0)
MCV: 97 fL (ref 78.0–100.0)
MONOS PCT: 0 % — AB (ref 3–12)
Monocytes Absolute: 0 10*3/uL — ABNORMAL LOW (ref 0.1–1.0)
NEUTROS ABS: 9.5 10*3/uL — AB (ref 1.7–7.7)
NEUTROS PCT: 96 % — AB (ref 43–77)
Platelets: 98 10*3/uL — ABNORMAL LOW (ref 150–400)
RBC: 2.65 MIL/uL — AB (ref 4.22–5.81)
RDW: 18.5 % — ABNORMAL HIGH (ref 11.5–15.5)
WBC: 9.9 10*3/uL (ref 4.0–10.5)

## 2014-04-19 LAB — URINALYSIS, ROUTINE W REFLEX MICROSCOPIC
Bilirubin Urine: NEGATIVE
Glucose, UA: NEGATIVE mg/dL
HGB URINE DIPSTICK: NEGATIVE
Ketones, ur: NEGATIVE mg/dL
LEUKOCYTES UA: NEGATIVE
Nitrite: NEGATIVE
PH: 6 (ref 5.0–8.0)
PROTEIN: NEGATIVE mg/dL
SPECIFIC GRAVITY, URINE: 1.012 (ref 1.005–1.030)
Urobilinogen, UA: 1 mg/dL (ref 0.0–1.0)

## 2014-04-19 LAB — COMPREHENSIVE METABOLIC PANEL
ALK PHOS: 209 U/L — AB (ref 39–117)
ALT: 43 U/L (ref 0–53)
AST: 54 U/L — ABNORMAL HIGH (ref 0–37)
Albumin: 2.8 g/dL — ABNORMAL LOW (ref 3.5–5.2)
Anion gap: 13 (ref 5–15)
BUN: 36 mg/dL — ABNORMAL HIGH (ref 6–23)
CHLORIDE: 101 mmol/L (ref 96–112)
CO2: 23 mmol/L (ref 19–32)
Calcium: 8.4 mg/dL (ref 8.4–10.5)
Creatinine, Ser: 1.83 mg/dL — ABNORMAL HIGH (ref 0.50–1.35)
GFR calc Af Amer: 39 mL/min — ABNORMAL LOW (ref >90)
GFR, EST NON AFRICAN AMERICAN: 34 mL/min — AB (ref >90)
Glucose, Bld: 153 mg/dL — ABNORMAL HIGH (ref 70–99)
POTASSIUM: 3.7 mmol/L (ref 3.5–5.1)
SODIUM: 137 mmol/L (ref 135–145)
TOTAL PROTEIN: 6.1 g/dL (ref 6.0–8.3)
Total Bilirubin: 2.1 mg/dL — ABNORMAL HIGH (ref 0.3–1.2)

## 2014-04-19 LAB — I-STAT TROPONIN, ED: TROPONIN I, POC: 0 ng/mL (ref 0.00–0.08)

## 2014-04-19 LAB — I-STAT CG4 LACTIC ACID, ED: LACTIC ACID, VENOUS: 1.13 mmol/L (ref 0.5–2.0)

## 2014-04-19 LAB — LIPASE, BLOOD: LIPASE: 19 U/L (ref 11–59)

## 2014-04-19 LAB — BRAIN NATRIURETIC PEPTIDE: B NATRIURETIC PEPTIDE 5: 69.1 pg/mL (ref 0.0–100.0)

## 2014-04-19 LAB — TROPONIN I: Troponin I: <0.03 ng/mL (ref <0.031)

## 2014-04-19 MED ORDER — FERROUS SULFATE 325 (65 FE) MG PO TABS
325.0000 mg | ORAL_TABLET | Freq: Every day | ORAL | Status: DC
  Administered 2014-04-24: 325 mg via ORAL
  Filled 2014-04-19 (×6): qty 1

## 2014-04-19 MED ORDER — CHLORHEXIDINE GLUCONATE 0.12 % MT SOLN
15.0000 mL | Freq: Two times a day (BID) | OROMUCOSAL | Status: DC
  Administered 2014-04-19 – 2014-05-02 (×21): 15 mL via OROMUCOSAL
  Filled 2014-04-19 (×30): qty 15

## 2014-04-19 MED ORDER — ENSURE ENLIVE PO LIQD
237.0000 mL | Freq: Three times a day (TID) | ORAL | Status: DC
  Filled 2014-04-19 (×4): qty 237

## 2014-04-19 MED ORDER — POLYVINYL ALCOHOL 1.4 % OP SOLN
Freq: Every day | OPHTHALMIC | Status: DC
  Administered 2014-04-20 – 2014-04-21 (×2): 1 [drp] via OPHTHALMIC
  Administered 2014-04-22 – 2014-04-25 (×3): via OPHTHALMIC
  Administered 2014-04-26: 1 [drp] via OPHTHALMIC
  Administered 2014-04-27 – 2014-05-02 (×6): via OPHTHALMIC
  Filled 2014-04-19 (×3): qty 15

## 2014-04-19 MED ORDER — TECHNETIUM TO 99M ALBUMIN AGGREGATED
5.0000 | Freq: Once | INTRAVENOUS | Status: AC | PRN
  Administered 2014-04-19: 5 via INTRAVENOUS

## 2014-04-19 MED ORDER — FOLIC ACID 0.5 MG HALF TAB
500.0000 ug | ORAL_TABLET | Freq: Every day | ORAL | Status: DC
  Administered 2014-04-19 – 2014-05-02 (×14): 0.5 mg via ORAL
  Filled 2014-04-19 (×14): qty 1

## 2014-04-19 MED ORDER — NYSTATIN 100000 UNIT/ML MT SUSP
5.0000 mL | Freq: Four times a day (QID) | OROMUCOSAL | Status: DC
  Administered 2014-04-19 – 2014-05-02 (×43): 500000 [IU] via ORAL
  Filled 2014-04-19 (×54): qty 5

## 2014-04-19 MED ORDER — HEPARIN SODIUM (PORCINE) 5000 UNIT/ML IJ SOLN
5000.0000 [IU] | Freq: Three times a day (TID) | INTRAMUSCULAR | Status: DC
  Administered 2014-04-19 – 2014-04-20 (×3): 5000 [IU] via SUBCUTANEOUS
  Filled 2014-04-19 (×8): qty 1

## 2014-04-19 MED ORDER — ACETAMINOPHEN 500 MG PO TABS
500.0000 mg | ORAL_TABLET | Freq: Four times a day (QID) | ORAL | Status: DC | PRN
  Administered 2014-04-19 – 2014-04-21 (×2): 500 mg via ORAL
  Filled 2014-04-19 (×2): qty 1

## 2014-04-19 MED ORDER — CETYLPYRIDINIUM CHLORIDE 0.05 % MT LIQD
7.0000 mL | Freq: Two times a day (BID) | OROMUCOSAL | Status: DC
  Administered 2014-04-24 – 2014-05-02 (×10): 7 mL via OROMUCOSAL

## 2014-04-19 MED ORDER — MAGIC MOUTHWASH
5.0000 mL | Freq: Three times a day (TID) | ORAL | Status: DC | PRN
  Administered 2014-04-19: 5 mL via ORAL
  Filled 2014-04-19 (×2): qty 5

## 2014-04-19 MED ORDER — SODIUM CHLORIDE 0.9 % IV SOLN
Freq: Once | INTRAVENOUS | Status: AC
  Administered 2014-04-19: 16:00:00 via INTRAVENOUS

## 2014-04-19 MED ORDER — MEGESTROL ACETATE 40 MG PO TABS
40.0000 mg | ORAL_TABLET | Freq: Every day | ORAL | Status: DC
  Administered 2014-04-20: 40 mg via ORAL
  Filled 2014-04-19: qty 1

## 2014-04-19 MED ORDER — TECHNETIUM TC 99M DIETHYLENETRIAME-PENTAACETIC ACID: 40.0000 | Freq: Once | INTRAVENOUS | Status: AC | PRN

## 2014-04-19 MED ORDER — CEFTRIAXONE SODIUM IN DEXTROSE 20 MG/ML IV SOLN
1.0000 g | INTRAVENOUS | Status: DC
  Administered 2014-04-19 – 2014-04-20 (×2): 1 g via INTRAVENOUS
  Filled 2014-04-19 (×2): qty 50

## 2014-04-19 MED ORDER — SIMVASTATIN 10 MG PO TABS
10.0000 mg | ORAL_TABLET | Freq: Every day | ORAL | Status: DC
  Administered 2014-04-19 – 2014-04-23 (×5): 10 mg via ORAL
  Filled 2014-04-19 (×6): qty 1

## 2014-04-19 MED ORDER — CLOBETASOL PROPIONATE 0.05 % EX CREA
1.0000 "application " | TOPICAL_CREAM | Freq: Every day | CUTANEOUS | Status: DC | PRN
  Administered 2014-04-19 – 2014-04-23 (×3): 1 via TOPICAL
  Filled 2014-04-19 (×2): qty 15

## 2014-04-19 NOTE — H&P (Addendum)
[facility] History and Physical  WOODFORD STREGE JSE:[id number] DOB: 1933-08-28 DOA: 04/19/2014  Referring physician: EDP PCP: [name], MD   Chief Complaint: Leg swelling   HPI: [name] is a 81 y.o. 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  Patient presents to ED with SOB and swelling and redness of his legs.  He especially has pain, redness, swelling, of his right leg.  This has been worsening over the past few weeks.  He has developed SOB even with simple ADLs at home, SOB has been ongoing with chemo though, slightly worse over the past couple of weeks but this is longstanding to some degree he states.  Review of Systems: Systems reviewed.  As above, otherwise negative  Past Medical History  Diagnosis Date  . COPD (chronic obstructive pulmonary disease)   . Allergy     allergic rhinitis  . Hx of radiation therapy 12/09/12-01/01/13    lung 37.5Gy  . nscl ca w/ brain mets dx'd 08/2012    Patient has a lung mass which is being evaluated   Past Surgical History  Procedure Laterality Date  . Cholecystectomy     Social History:  reports that he quit smoking about 15 years ago. He has never used smokeless tobacco. He reports that he does not drink alcohol or use illicit drugs.  Allergies  Allergen Reactions  . Asa [Aspirin] Nausea Only  . Sulfa Antibiotics Nausea And Vomiting    Family History  Problem Relation Age of Onset  . Cancer Mother     breast  . Stroke Father      Prior to Admission medications   Medication Sig Start Date End Date Taking? Authorizing Provider  acetaminophen (TYLENOL) 500 MG tablet Take 500 mg by mouth every 6 (six) hours as needed for mild pain.    Yes Historical Provider, MD  Alum & Mag Hydroxide-Simeth (MAGIC MOUTHWASH) SOLN Take 5 mLs by mouth 3 (three) times daily as needed for mouth pain.   Yes Historical Provider, MD  clobetasol cream (TEMOVATE) 0.73 % Apply 1 application topically daily as needed (for itching).   Yes Historical  Provider, MD  dexamethasone (DECADRON) 4 MG tablet 4 mg by mouth twice a day the day before, day of and day after the chemotherapy every 3 weeks 02/16/14  Yes Adrena E Johnson, PA-C  feeding supplement, ENSURE COMPLETE, (ENSURE COMPLETE) LIQD Take 237 mLs by mouth 3 (three) times daily between meals. 03/11/14  Yes Maryann Mikhail, DO  ferrous sulfate (IRON SUPPLEMENT) 325 (65 FE) MG tablet Take 325 mg by mouth daily with breakfast.   Yes Historical Provider, MD  folic acid (FOLVITE) 710 MCG tablet Take 400 mcg by mouth every morning.   Yes Historical Provider, MD  furosemide (LASIX) 20 MG tablet Take 1 tablet by mouth daily. 04/15/14  Yes Historical Provider, MD  megestrol (MEGACE) 40 MG tablet Take 1 tablet (40 mg total) by mouth daily. 03/11/14  Yes Maryann Mikhail, DO  PEMEtrexed Disodium (ALIMTA IV) Inject into the vein every 21 ( twenty-one) days.   Yes Historical Provider, MD  Polyethyl Glycol-Propyl Glycol (SYSTANE OP) Place 1 drop into both eyes daily.   Yes Historical Provider, MD  simvastatin (ZOCOR) 10 MG tablet Take 10 mg by mouth at bedtime.   Yes Historical Provider, MD   Physical Exam: Filed Vitals:   04/19/14 2057  BP: 131/58  Pulse: 118  Temp: 98.2 F (36.8 C)  Resp: 25    BP 131/58 mmHg  Pulse 118  Temp(Src) 98.2 F (36.8 C) (Oral)  Resp 25  SpO2 98%  General Appearance:    Alert, oriented, no distress, appears stated age  Head:    Normocephalic, atraumatic  Eyes:    PERRL, EOMI, sclera non-icteric        Nose:   Nares without drainage or epistaxis. Mucosa, turbinates normal  Throat:   Moist mucous membranes. Oropharynx without erythema or exudate.  Neck:   Supple. No carotid bruits.  No thyromegaly.  No lymphadenopathy.   Back:     No CVA tenderness, no spinal tenderness  Lungs:     Clear lungs, but patient is having dyspnea with conversation.  Chest wall:    No tenderness to palpitation  Heart:    Regular rate and rhythm without murmurs, gallops, rubs  Abdomen:      Soft, non-tender, nondistended, normal bowel sounds, no organomegaly  Genitalia:    deferred  Rectal:    deferred  Extremities:   No clubbing, cyanosis or edema.  Pulses:   2+ and symmetric all extremities  Skin:   Skin color, texture, turgor normal, no rashes or lesions  Lymph nodes:   Cervical, supraclavicular, and axillary nodes normal  Neurologic:   CNII-XII intact. Normal strength, sensation and reflexes      throughout    Labs on Admission:  Basic Metabolic Panel:  Recent Labs Lab 04/13/14 1134 04/19/14 1453  NA 138 137  K 3.3* 3.7  CL  --  101  CO2 22 23  GLUCOSE 115 153*  BUN 16.2 36*  CREATININE 1.6* 1.83*  CALCIUM 8.5 8.4   Liver Function Tests:  Recent Labs Lab 04/13/14 1134 04/19/14 1453  AST 29 54*  ALT 17 43  ALKPHOS 195* 209*  BILITOT 0.83 2.1*  PROT 6.0* 6.1  ALBUMIN 2.6* 2.8*    Recent Labs Lab 04/19/14 1453  LIPASE 19   No results for input(s): AMMONIA in the last 168 hours. CBC:  Recent Labs Lab 04/13/14 1133 04/19/14 1453  WBC 7.6 9.9  NEUTROABS 6.9* 9.5*  HGB 9.2* 8.7*  HCT 27.0* 25.7*  MCV 95.5 97.0  PLT 161 98*   Cardiac Enzymes:  Recent Labs Lab 04/19/14 1524  TROPONINI <0.03    BNP (last 3 results) No results for input(s): PROBNP in the last 8760 hours. CBG: No results for input(s): GLUCAP in the last 168 hours.  Radiological Exams on Admission: Dg Chest 2 View  04/19/2014   CLINICAL DATA:  Shortness of breath, leg swelling, lung cancer, initial encounter.  EXAM: CHEST  2 VIEW  COMPARISON:  03/06/2014 and CT chest 03/04/2014.  FINDINGS: Trachea is midline. Heart size stable. Right IJ power port tip is in the high right atrium. Right perihilar opacification and architectural distortion appear grossly stable. Small right pleural effusion with volume loss at the base of the right hemi thorax. Reticular coarsening in the periphery of the left hemi thorax. No left pleural fluid.  IMPRESSION: 1. Right perihilar masslike  opacification with architectural distortion, unchanged from 03/06/2014 and better interrogated on cross-sectional imaging 03/04/2014. 2. Small right pleural effusion, possibly increased. 3. Subpleural reticulation, indicative of fibrosis.   Electronically Signed   By: Lorin Picket M.D.   On: 04/19/2014 16:10   Nm Pulmonary Perf And Vent  04/19/2014   CLINICAL DATA:  Shortness of breath.  EXAM: NUCLEAR MEDICINE VENTILATION - PERFUSION LUNG SCAN  TECHNIQUE: Ventilation images were obtained in multiple projections using inhaled aerosol technetium 99 M DTPA. Perfusion  images were obtained in multiple projections after intravenous injection of Tc-9m MAA.  RADIOPHARMACEUTICALS:  40.0 MCi Tc-53m DTPA aerosol and 5.0 mCi Tc-26m MAA  COMPARISON:  Chest x-ray 04/19/2014 and CT chest 03/04/2014  FINDINGS: Ventilation: Patchy heterogeneous ventilation study with some linear defects likely corresponding to pleural effusions and lung disease.  Perfusion: Patchy heterogeneous perfusion scan matching the ventilation scan. No mismatching segmental or subsegmental defects to suggest pulmonary embolism.  IMPRESSION: Low probability ventilation perfusion lung scan for pulmonary embolism.   Electronically Signed   By: Marijo Sanes M.D.   On: 04/19/2014 19:10    EKG: Independently reviewed.  Assessment/Plan Principal Problem:   Cellulitis of leg, right Active Problems:   Primary cancer of right lower lobe of lung   Tachycardia   CKD (chronic kidney disease)   Oral thrush   1. Cellulitis of right leg - 1. Will treat with rocephin empirically 2. Blood CX pending 2. Tachycardia - given the tachycardia and dyspnea, will go ahead and get 2d echo to evaluate the known malginant pericardial effusion that the patient has.  May also need CT chest but hesitate to give contrast given the AKI on CKD that is present this admit.  Could be due to issue #1 above, but patient has no other SIRS criteria at this point. 3. CKD -  worsening kidney function with creatinine now up to 1.8, this has been trending up over the past couple of months.  Will hold lasix for now and gently hydrate given the elevation in creatinine in setting of infection and tachycardia. 4. Oral thrush - nystatin    Code Status: Full Code  Family Communication: No family in room Disposition Plan: Admit to inpatient   Time spent: 70 min  Charnay Nazario M. [facility] Pager [phone]  If 7AM-7PM, please contact the day team taking care of the patient [email or web address] Password [facility] 04/19/2014, 10:22 PM

## 2014-04-19 NOTE — Progress Notes (Signed)
VASCULAR LAB PRELIMINARY  PRELIMINARY  PRELIMINARY  PRELIMINARY  Bilateral lower extremity venous duplex completed.    Preliminary report:  Bilateral:  No evidence of DVT, superficial thrombosis, or Baker's Cyst. Moderate interstitial fluid noted in the lower leg .   Garlen Reinig, RVS 04/19/2014, 3:55 PM

## 2014-04-19 NOTE — ED Notes (Signed)
Pt returned from [facility]

## 2014-04-19 NOTE — ED Notes (Addendum)
Pt to NM

## 2014-04-19 NOTE — ED Notes (Signed)
[place] in room

## 2014-04-19 NOTE — ED Notes (Signed)
Pt reminded of the need for urine.  

## 2014-04-19 NOTE — ED Notes (Signed)
Pt to radiology.

## 2014-04-19 NOTE — ED Notes (Signed)
Provided patient a coke with ice. Pt and family member aware of admission.

## 2014-04-19 NOTE — ED Provider Notes (Signed)
CSN: [id number]     Arrival date & time 04/19/14  1427 History   First MD Initiated Contact with Patient 04/19/14 1450     Chief Complaint  Patient presents with  . Chemo Card   . Leg Swelling      HPI  Vision presents for evaluation of shortness of breath as well as swelling and redness of his legs. Patient has a history of lung cancer known brain metastasis. Receiving IV vancomycin every 4 weeks since December, 2014. Swelling of his extremities over the last several months. Recently, 3 weeks ago, placed on Lasix. Some of the swelling has resolved, however the right leg has become more red inflamed painful. He cannot bear weight on it is a because of the pain. He is short of breath even with simple ADLs at home. Denies cough sputum production, hemoptysis fevers chills. No abdominal complaints nausea vomiting diarrhea. No chest pain.  Past Medical History  Diagnosis Date  . COPD (chronic obstructive pulmonary disease)   . Allergy     allergic rhinitis  . Hx of radiation therapy 12/09/12-01/01/13    lung 37.5Gy  . nscl ca w/ brain mets dx'd 08/2012    Patient has a lung mass which is being evaluated   Past Surgical History  Procedure Laterality Date  . Cholecystectomy     Family History  Problem Relation Age of Onset  . Cancer Mother     breast  . Stroke Father    History  Substance Use Topics  . Smoking status: Former Smoker    Quit date: 09/11/1998  . Smokeless tobacco: Never Used  . Alcohol Use: No    Review of Systems  Constitutional: Negative for fever, chills, diaphoresis, appetite change and fatigue.  HENT: Negative for mouth sores, sore throat and trouble swallowing.   Eyes: Negative for visual disturbance.  Respiratory: Positive for shortness of breath. Negative for cough, chest tightness and wheezing.   Cardiovascular: Positive for leg swelling. Negative for chest pain.  Gastrointestinal: Negative for nausea, vomiting, abdominal pain, diarrhea and abdominal  distention.  Endocrine: Negative for polydipsia, polyphagia and polyuria.  Genitourinary: Negative for dysuria, frequency and hematuria.  Musculoskeletal: Negative for gait problem.  Skin: Positive for color change and rash. Negative for pallor.  Neurological: Negative for dizziness, syncope, light-headedness and headaches.  Hematological: Does not bruise/bleed easily.  Psychiatric/Behavioral: Negative for behavioral problems and confusion.      Allergies  Asa and Sulfa antibiotics  Home Medications   Prior to Admission medications   Medication Sig Start Date End Date Taking? Authorizing Provider  acetaminophen (TYLENOL) 500 MG tablet Take 500 mg by mouth every 6 (six) hours as needed for mild pain.    Yes Historical Provider, MD  Alum & Mag Hydroxide-Simeth (MAGIC MOUTHWASH) SOLN Take 5 mLs by mouth 3 (three) times daily as needed for mouth pain.   Yes Historical Provider, MD  clobetasol cream (TEMOVATE) 8.25 % Apply 1 application topically daily as needed (for itching).   Yes Historical Provider, MD  dexamethasone (DECADRON) 4 MG tablet 4 mg by mouth twice a day the day before, day of and day after the chemotherapy every 3 weeks 02/16/14  Yes Adrena E Johnson, PA-C  feeding supplement, ENSURE COMPLETE, (ENSURE COMPLETE) LIQD Take 237 mLs by mouth 3 (three) times daily between meals. 03/11/14  Yes Maryann Mikhail, DO  ferrous sulfate (IRON SUPPLEMENT) 325 (65 FE) MG tablet Take 325 mg by mouth daily with breakfast.   Yes Historical Provider, MD  folic acid (FOLVITE) 790 MCG tablet Take 400 mcg by mouth every morning.   Yes Historical Provider, MD  furosemide (LASIX) 20 MG tablet Take 1 tablet by mouth daily. 04/15/14  Yes Historical Provider, MD  megestrol (MEGACE) 40 MG tablet Take 1 tablet (40 mg total) by mouth daily. 03/11/14  Yes Maryann Mikhail, DO  PEMEtrexed Disodium (ALIMTA IV) Inject into the vein every 21 ( twenty-one) days.   Yes Historical Provider, MD  Polyethyl Glycol-Propyl  Glycol (SYSTANE OP) Place 1 drop into both eyes daily.   Yes Historical Provider, MD  simvastatin (ZOCOR) 10 MG tablet Take 10 mg by mouth at bedtime.   Yes Historical Provider, MD  simvastatin (ZOCOR) 10 MG tablet TAKE 1 TABLET (10 MG TOTAL) BY MOUTH AT BEDTIME. 04/16/14  Yes Christy A Hawks, FNP   BP 121/53 mmHg  Pulse 128  Temp(Src) 98.4 F (36.9 C) (Axillary)  Resp 24  SpO2 97% Physical Exam  Constitutional: He is oriented to person, place, and time. He appears well-developed and well-nourished. No distress.  HENT:  Head: Normocephalic.  Eyes: Conjunctivae are normal. Pupils are equal, round, and reactive to light. No scleral icterus.  Neck: Normal range of motion. Neck supple. No thyromegaly present.  Cardiovascular: Normal rate and regular rhythm.  Exam reveals no gallop and no friction rub.   No murmur heard. Pulmonary/Chest: Effort normal and breath sounds normal. No respiratory distress. He has no wheezes. He has no rales.  Dyspneic with conversation.  Abdominal: Soft. Bowel sounds are normal. He exhibits no distension. There is no tenderness. There is no rebound.  Musculoskeletal: Normal range of motion.  Neurological: He is alert and oriented to person, place, and time.  Skin: Skin is warm and dry. No rash noted.  Lower show any edema. Right greater than left. Erythema and violaceous discoloration of the skin to right foot. Good sensation and cap refill. Painful to touch. Strong pulses  Psychiatric: He has a normal mood and affect. His behavior is normal.    ED Course  Procedures (including critical care time) Labs Review Labs Reviewed  CBC WITH DIFFERENTIAL/PLATELET - Abnormal; Notable for the following:    RBC 2.65 (*)    Hemoglobin 8.7 (*)    HCT 25.7 (*)    RDW 18.5 (*)    Platelets 98 (*)    Neutrophils Relative % 96 (*)    Neutro Abs 9.5 (*)    Lymphocytes Relative 3 (*)    Lymphs Abs 0.3 (*)    Monocytes Relative 0 (*)    Monocytes Absolute 0.0 (*)    All  other components within normal limits  COMPREHENSIVE METABOLIC PANEL - Abnormal; Notable for the following:    Glucose, Bld 153 (*)    BUN 36 (*)    Creatinine, Ser 1.83 (*)    Albumin 2.8 (*)    AST 54 (*)    Alkaline Phosphatase 209 (*)    Total Bilirubin 2.1 (*)    GFR calc non Af Amer 34 (*)    GFR calc Af Amer 39 (*)    All other components within normal limits  LIPASE, BLOOD  URINALYSIS, ROUTINE W REFLEX MICROSCOPIC  BRAIN NATRIURETIC PEPTIDE  TROPONIN I  I-STAT TROPOININ, ED  I-STAT CG4 LACTIC ACID, ED    Imaging Review Dg Chest 2 View  04/19/2014   CLINICAL DATA:  Shortness of breath, leg swelling, lung cancer, initial encounter.  EXAM: CHEST  2 VIEW  COMPARISON:  03/06/2014 and CT chest  03/04/2014.  FINDINGS: Trachea is midline. Heart size stable. Right IJ power port tip is in the high right atrium. Right perihilar opacification and architectural distortion appear grossly stable. Small right pleural effusion with volume loss at the base of the right hemi thorax. Reticular coarsening in the periphery of the left hemi thorax. No left pleural fluid.  IMPRESSION: 1. Right perihilar masslike opacification with architectural distortion, unchanged from 03/06/2014 and better interrogated on cross-sectional imaging 03/04/2014. 2. Small right pleural effusion, possibly increased. 3. Subpleural reticulation, indicative of fibrosis.   Electronically Signed   By: Lorin Picket M.D.   On: 04/19/2014 16:10   Nm Pulmonary Perf And Vent  04/19/2014   CLINICAL DATA:  Shortness of breath.  EXAM: NUCLEAR MEDICINE VENTILATION - PERFUSION LUNG SCAN  TECHNIQUE: Ventilation images were obtained in multiple projections using inhaled aerosol technetium 99 M DTPA. Perfusion images were obtained in multiple projections after intravenous injection of Tc-13m MAA.  RADIOPHARMACEUTICALS:  40.0 MCi Tc-68m DTPA aerosol and 5.0 mCi Tc-56m MAA  COMPARISON:  Chest x-ray 04/19/2014 and CT chest 03/04/2014  FINDINGS:  Ventilation: Patchy heterogeneous ventilation study with some linear defects likely corresponding to pleural effusions and lung disease.  Perfusion: Patchy heterogeneous perfusion scan matching the ventilation scan. No mismatching segmental or subsegmental defects to suggest pulmonary embolism.  IMPRESSION: Low probability ventilation perfusion lung scan for pulmonary embolism.   Electronically Signed   By: Marijo Sanes M.D.   On: 04/19/2014 19:10     EKG Interpretation   Date/Time:  Monday April 19 2014 14:53:50 EDT Ventricular Rate:  122 PR Interval:  152 QRS Duration: 75 QT Interval:  378 QTC Calculation: 539 R Axis:   80 Text Interpretation:  Sinus tachycardia Atrial premature complexes Low  voltage, extremity and precordial leads Prolonged QT interval Confirmed by  Jeneen Rinks  MD, [place] ([id number]) on 04/19/2014 3:13:01 PM      MDM   Final diagnoses:  SOB (shortness of breath)  Dyspnea    Patient with low probability V/Q. Negative Dopplers. Does not have fever White blood cell count. However, think is most likely diagnosis is cellulitis. He is not improved with diuresis. He is minimal acute kidney injury and slight elevation of his creatinine above baseline. Blood cultures are obtained. Given IV vancomycin. His heart rate remains 130. He'll be admitted. Care discussed with Dr. Fabio Neighbors.    Tanna Furry, MD 04/19/14 2015

## 2014-04-19 NOTE — ED Notes (Signed)
Patient may have something to drink with permission from provider. Pts spouse has tea for fluids.

## 2014-04-19 NOTE — ED Notes (Signed)
Pt c/o increasing BLE swelling since starting chemo x 4-5 months ago.  Pain score 5/10.  Pt reports that he has spoken to Oncologist about symptoms, but "they have done nothing expect lower my dose of chemo."  Last treatment 3/29.  Pt reports that he takes lasix.  Hx of lung CA w/ brain mets.

## 2014-04-19 NOTE — ED Notes (Signed)
Pt reminded of the need for a urine sample.

## 2014-04-20 ENCOUNTER — Other Ambulatory Visit: Payer: Medicare Other

## 2014-04-20 ENCOUNTER — Ambulatory Visit: Payer: Medicare Other

## 2014-04-20 ENCOUNTER — Ambulatory Visit: Payer: Medicare Other | Admitting: Physician Assistant

## 2014-04-20 DIAGNOSIS — R0600 Dyspnea, unspecified: Secondary | ICD-10-CM

## 2014-04-20 DIAGNOSIS — N183 Chronic kidney disease, stage 3 (moderate): Secondary | ICD-10-CM

## 2014-04-20 MED ORDER — SODIUM CHLORIDE 0.9 % IJ SOLN
10.0000 mL | INTRAMUSCULAR | Status: DC | PRN
  Administered 2014-04-21: 20 mL
  Administered 2014-04-21 – 2014-05-02 (×13): 10 mL
  Filled 2014-04-20 (×14): qty 40

## 2014-04-20 MED ORDER — MEGESTROL ACETATE 40 MG PO TABS: 400.0000 mg | ORAL_TABLET | Freq: Two times a day (BID) | ORAL | Status: DC

## 2014-04-20 MED ORDER — LIP MEDEX EX OINT
TOPICAL_OINTMENT | CUTANEOUS | Status: AC
  Administered 2014-04-20: 1
  Filled 2014-04-20: qty 7

## 2014-04-20 MED ORDER — MEGESTROL ACETATE 400 MG/10ML PO SUSP
400.0000 mg | Freq: Two times a day (BID) | ORAL | Status: DC
  Administered 2014-04-20 – 2014-05-02 (×24): 400 mg via ORAL
  Filled 2014-04-20 (×26): qty 10

## 2014-04-20 NOTE — Progress Notes (Signed)
INITIAL NUTRITION ASSESSMENT  DOCUMENTATION CODES Per approved criteria  -Severe malnutrition in the context of chronic illness   INTERVENTION: Continue Ensure Enlive po TID, each supplement provides 350 kcal and 20 grams of protein  Recommend increase Megace dose from 40 mg daily to 400 mg BID.   NUTRITION DIAGNOSIS: Malnutrition related to chronic illness as evidenced by severe fat and muscle depletion.   Goal: Pt to meet >/= 90% of their estimated nutrition needs   Monitor:  PO intake, supplement acceptance, weight trends, labs  Reason for Assessment: Pt identified as at nutrition risk on the Malnutrition Screen Tool  ASSESSMENT: Pt with hx of NSCLC with mets to brain admitted with cellulitis of right leg now on antibiotics.   Per pt he has lost about 25 lb in total since cancer. He reports that he used to lose weight with chemo and then regain it back after treatment was over. He now feels that he is unable to regain the weight he loses.  Pt has thrush, does not like the food here, has been on megace at home.  Per pt his wife makes him eat.  Ambassador joined this RD in room to offer snack and we worked with pt to place his dinner order. Pt is willing to try ensure and magic cup.    Nutrition Focused Physical Exam:  Subcutaneous Fat:  Orbital Region: WDL Upper Arm Region: severe depletion Thoracic and Lumbar Region: severe depletion  Muscle:  Temple Region: WDL (swelling noted) Clavicle Bone Region: severe depletion Clavicle and Acromion Bone Region: severe depletion Scapular Bone Region: severe depletion Dorsal Hand: WDL Patellar Region: WDL Anterior Thigh Region: WDL Posterior Calf Region: edema  Edema: present+2 due to cellulitis    Height: Ht Readings from Last 1 Encounters:  04/19/14 5\' 11"  (1.803 m)    Weight: Wt Readings from Last 1 Encounters:  04/19/14 160 lb 7.9 oz (72.8 kg)    Ideal Body Weight: 78.1 kg   % Ideal Body Weight: 93%  Wt  Readings from Last 10 Encounters:  04/19/14 160 lb 7.9 oz (72.8 kg)  04/13/14 161 lb 3.2 oz (73.12 kg)  03/16/14 151 lb 14.4 oz (68.901 kg)  03/07/14 159 lb (72.122 kg)  02/16/14 158 lb 6.4 oz (71.85 kg)  02/04/14 156 lb 1.6 oz (70.806 kg)  01/26/14 159 lb 8 oz (72.349 kg)  01/15/14 152 lb 1.9 oz (69 kg)  01/05/14 161 lb 3.2 oz (73.12 kg)  12/16/13 160 lb (72.576 kg)    Usual Body Weight: 170 lb several months ago (09/2013)  % Usual Body Weight: 94%  BMI:  Body mass index is 22.39 kg/(m^2).  Estimated Nutritional Needs: Kcal: 2100-2300 Protein: 100-115 grams Fluid: >2.1 L/day  Skin: cellulitis   Diet Order: Diet regular Room service appropriate?: Yes; Fluid consistency:: Thin  EDUCATION NEEDS: -No education needs identified at this time   Intake/Output Summary (Last 24 hours) at 04/20/14 1530 Last data filed at 04/20/14 1353  Gross per 24 hour  Intake   2090 ml  Output   2025 ml  Net     65 ml    Last BM: 4/4   Labs:   Recent Labs Lab 04/19/14 1453  NA 137  K 3.7  CL 101  CO2 23  BUN 36*  CREATININE 1.83*  CALCIUM 8.4  GLUCOSE 153*    CBG (last 3)  No results for input(s): GLUCAP in the last 72 hours.  Scheduled Meds: . antiseptic oral rinse  7 mL  Mouth Rinse q12n4p  . cefTRIAXone (ROCEPHIN)  IV  1 g Intravenous Q24H  . chlorhexidine  15 mL Mouth Rinse BID  . feeding supplement (ENSURE ENLIVE)  237 mL Oral TID BM  . ferrous sulfate  325 mg Oral Q breakfast  . folic acid  027 mcg Oral Daily  . heparin  5,000 Units Subcutaneous 3 times per day  . lip balm      . megestrol  40 mg Oral Daily  . nystatin  5 mL Oral QID  . polyvinyl alcohol   Both Eyes Daily  . simvastatin  10 mg Oral QHS    Continuous Infusions:   Maylon Peppers RD, LDN, CNSC [phone] Pager [phone] After Hours Pager

## 2014-04-20 NOTE — Progress Notes (Signed)
Echocardiogram 2D Echocardiogram has been performed.  [name] 04/20/2014, 2:54 PM

## 2014-04-20 NOTE — Progress Notes (Signed)
[facility] PROGRESS NOTE  [name] XNA:[id number] DOB: 1933-12-14 DOA: 04/19/2014 PCP: [name], MD  Interim summary Patient is a pleasant 81 year old general with a past medical history of lung cancer with metastasis to brain, admitted to the medicine service on 04/19/2014 presented to the emergency department with complaints of swelling and erythema involving right lower extremity. Patient felt to have cellulitis as he was started on empiric IV antibiotic therapy with ceftriaxone 1 g IV every 24 hours.  Assessment/Plan: 1. Right lower extremity cellulitis, nonpurulent -Patient presenting with increased swelling, erythema, pain involving his right lower extremity. -Continue empiric IV antibiotic therapy with ceftriaxone -Blood cultures pending at the time of this dictation. -He remains afebrile  2.  Dyspnea. -Patient complaining of shortness of breath, having a chest x-ray on admission that revealed right perihilar masslike opacification with architectural distortion that was unchanged compared to prior exam on 03/06/2014. -Transthoracic echocardiogram showing ejection fraction 55-60% without wall motion abnormalities, grade 1 diastolic dysfunction. -BNP within normal limits at 69 -Patient is currently satting 96% on 2 L [place] supplemental oxygen  3.  Thrush. -Patient reporting improvement since starting Nystatin for times daily  4.  Stage III chronic kidney disease. -Initial lab work showed a grade 1.83 with BUN of 36 -Appears have a baseline between 1.5-1.6, was given gentle IV fluid hydration. -Repeat labs in a.m.   Code Status: Full code Family Communication:  Disposition Plan:    Antibiotics:  Ceftriaxone started on 04/19/2014  HPI/Subjective: Patient states feeling little better although continues to have ongoing swelling and pain involving his right lower extremity  Objective: Filed Vitals:   04/20/14 1353  BP: 114/61  Pulse: 119  Temp: 98.6 F (37  C)  Resp: 21    Intake/Output Summary (Last 24 hours) at 04/20/14 1744 Last data filed at 04/20/14 1716  Gross per 24 hour  Intake   2090 ml  Output   2325 ml  Net   -235 ml   Filed Weights   04/19/14 2105  Weight: 72.8 kg (160 lb 7.9 oz)    Exam:   General:  No acute distress, he is awake, alert, oriented, pleasant  Cardiovascular: Regular rate and rhythm normal S1-S2  Respiratory: Patient having clear lung sounds, no wheezing rhonchi or rales, normal respiratory effort did not appear to be in respiratory distress or use of accessory muscles  Abdomen: Soft nontender nondistended  Musculoskeletal: Right greater than left extremity edema associated with erythema involving foot, ankle and tibial regions, he has swelling and erythema involving his left lower extremity to a lesser degree.   Data Reviewed: Basic Metabolic Panel:  Recent Labs Lab 04/19/14 1453  NA 137  K 3.7  CL 101  CO2 23  GLUCOSE 153*  BUN 36*  CREATININE 1.83*  CALCIUM 8.4   Liver Function Tests:  Recent Labs Lab 04/19/14 1453  AST 54*  ALT 43  ALKPHOS 209*  BILITOT 2.1*  PROT 6.1  ALBUMIN 2.8*    Recent Labs Lab 04/19/14 1453  LIPASE 19   No results for input(s): AMMONIA in the last 168 hours. CBC:  Recent Labs Lab 04/19/14 1453  WBC 9.9  NEUTROABS 9.5*  HGB 8.7*  HCT 25.7*  MCV 97.0  PLT 98*   Cardiac Enzymes:  Recent Labs Lab 04/19/14 1524  TROPONINI <0.03   BNP (last 3 results)  Recent Labs  04/19/14 1453  BNP 69.1    ProBNP (last 3 results) No results for input(s): PROBNP in the last  8760 hours.  CBG: No results for input(s): GLUCAP in the last 168 hours.  No results found for this or any previous visit (from the past 240 hour(s)).   Studies: Dg Chest 2 View  04/19/2014   CLINICAL DATA:  Shortness of breath, leg swelling, lung cancer, initial encounter.  EXAM: CHEST  2 VIEW  COMPARISON:  03/06/2014 and CT chest 03/04/2014.  FINDINGS: Trachea is  midline. Heart size stable. Right IJ power port tip is in the high right atrium. Right perihilar opacification and architectural distortion appear grossly stable. Small right pleural effusion with volume loss at the base of the right hemi thorax. Reticular coarsening in the periphery of the left hemi thorax. No left pleural fluid.  IMPRESSION: 1. Right perihilar masslike opacification with architectural distortion, unchanged from 03/06/2014 and better interrogated on cross-sectional imaging 03/04/2014. 2. Small right pleural effusion, possibly increased. 3. Subpleural reticulation, indicative of fibrosis.   Electronically Signed   By: Lorin Picket M.D.   On: 04/19/2014 16:10   Nm Pulmonary Perf And Vent  04/19/2014   CLINICAL DATA:  Shortness of breath.  EXAM: NUCLEAR MEDICINE VENTILATION - PERFUSION LUNG SCAN  TECHNIQUE: Ventilation images were obtained in multiple projections using inhaled aerosol technetium 99 M DTPA. Perfusion images were obtained in multiple projections after intravenous injection of Tc-72m MAA.  RADIOPHARMACEUTICALS:  40.0 MCi Tc-22m DTPA aerosol and 5.0 mCi Tc-69m MAA  COMPARISON:  Chest x-ray 04/19/2014 and CT chest 03/04/2014  FINDINGS: Ventilation: Patchy heterogeneous ventilation study with some linear defects likely corresponding to pleural effusions and lung disease.  Perfusion: Patchy heterogeneous perfusion scan matching the ventilation scan. No mismatching segmental or subsegmental defects to suggest pulmonary embolism.  IMPRESSION: Low probability ventilation perfusion lung scan for pulmonary embolism.   Electronically Signed   By: Marijo Sanes M.D.   On: 04/19/2014 19:10    Scheduled Meds: . antiseptic oral rinse  7 mL Mouth Rinse q12n4p  . cefTRIAXone (ROCEPHIN)  IV  1 g Intravenous Q24H  . chlorhexidine  15 mL Mouth Rinse BID  . feeding supplement (ENSURE ENLIVE)  237 mL Oral TID BM  . ferrous sulfate  325 mg Oral Q breakfast  . folic acid  740 mcg Oral Daily  .  heparin  5,000 Units Subcutaneous 3 times per day  . lip balm      . megestrol  400 mg Oral BID  . nystatin  5 mL Oral QID  . polyvinyl alcohol   Both Eyes Daily  . simvastatin  10 mg Oral QHS   Continuous Infusions:   Principal Problem:   Cellulitis of leg, right Active Problems:   Primary cancer of right lower lobe of lung   Tachycardia   CKD (chronic kidney disease)   Oral thrush    Time spent: 35 min    Kelvin Cellar  [facility] Pager [phone]. If 7PM-7AM, please contact night-coverage at [email or web address], password [facility] 04/20/2014, 5:44 PM  LOS: 1 day

## 2014-04-20 NOTE — Progress Notes (Signed)
UR complete 

## 2014-04-21 DIAGNOSIS — I313 Pericardial effusion (noninflammatory): Secondary | ICD-10-CM | POA: Diagnosis present

## 2014-04-21 DIAGNOSIS — I3139 Other pericardial effusion (noninflammatory): Secondary | ICD-10-CM | POA: Diagnosis present

## 2014-04-21 DIAGNOSIS — T451X5A Adverse effect of antineoplastic and immunosuppressive drugs, initial encounter: Secondary | ICD-10-CM

## 2014-04-21 DIAGNOSIS — E43 Unspecified severe protein-calorie malnutrition: Secondary | ICD-10-CM | POA: Diagnosis present

## 2014-04-21 DIAGNOSIS — D61810 Antineoplastic chemotherapy induced pancytopenia: Secondary | ICD-10-CM | POA: Diagnosis present

## 2014-04-21 DIAGNOSIS — E876 Hypokalemia: Secondary | ICD-10-CM

## 2014-04-21 LAB — CBC
HCT: 21.6 % — ABNORMAL LOW (ref 39.0–52.0)
HEMATOCRIT: 20.1 % — AB (ref 39.0–52.0)
HEMOGLOBIN: 7 g/dL — AB (ref 13.0–17.0)
HEMOGLOBIN: 7.6 g/dL — AB (ref 13.0–17.0)
MCH: 33.5 pg (ref 26.0–34.0)
MCH: 32.9 pg (ref 26.0–34.0)
MCHC: 34.8 g/dL (ref 30.0–36.0)
MCHC: 35.2 g/dL (ref 30.0–36.0)
MCV: 96.2 fL (ref 78.0–100.0)
MCV: 93.5 fL (ref 78.0–100.0)
PLATELETS: 41 10*3/uL — AB (ref 150–400)
Platelets: 28 10*3/uL — CL (ref 150–400)
RBC: 2.09 MIL/uL — ABNORMAL LOW (ref 4.22–5.81)
RBC: 2.31 MIL/uL — ABNORMAL LOW (ref 4.22–5.81)
RDW: 18 % — ABNORMAL HIGH (ref 11.5–15.5)
RDW: 17.4 % — ABNORMAL HIGH (ref 11.5–15.5)
WBC: 2.4 10*3/uL — ABNORMAL LOW (ref 4.0–10.5)
WBC: 1.9 10*3/uL — ABNORMAL LOW (ref 4.0–10.5)

## 2014-04-21 LAB — BASIC METABOLIC PANEL
Anion gap: 9 (ref 5–15)
BUN: 31 mg/dL — ABNORMAL HIGH (ref 6–23)
CO2: 24 mmol/L (ref 19–32)
Calcium: 8 mg/dL — ABNORMAL LOW (ref 8.4–10.5)
Chloride: 104 mmol/L (ref 96–112)
Creatinine, Ser: 1.61 mg/dL — ABNORMAL HIGH (ref 0.50–1.35)
GFR, EST AFRICAN AMERICAN: 46 mL/min — AB (ref >90)
GFR, EST NON AFRICAN AMERICAN: 39 mL/min — AB (ref >90)
Glucose, Bld: 124 mg/dL — ABNORMAL HIGH (ref 70–99)
POTASSIUM: 3 mmol/L — AB (ref 3.5–5.1)
Sodium: 137 mmol/L (ref 135–145)

## 2014-04-21 LAB — PREPARE RBC (CROSSMATCH)

## 2014-04-21 MED ORDER — SODIUM CHLORIDE 0.9 % IV SOLN
Freq: Once | INTRAVENOUS | Status: AC
  Administered 2014-04-21: 10 mL/h via INTRAVENOUS

## 2014-04-21 MED ORDER — VANCOMYCIN HCL 10 G IV SOLR
1250.0000 mg | INTRAVENOUS | Status: DC
  Administered 2014-04-21 – 2014-04-22 (×2): 1250 mg via INTRAVENOUS
  Filled 2014-04-21 (×4): qty 1250

## 2014-04-21 MED ORDER — POTASSIUM CHLORIDE CRYS ER 20 MEQ PO TBCR
40.0000 meq | EXTENDED_RELEASE_TABLET | Freq: Once | ORAL | Status: AC
  Administered 2014-04-21: 40 meq via ORAL
  Filled 2014-04-21: qty 2

## 2014-04-21 MED ORDER — ENSURE ENLIVE PO LIQD
237.0000 mL | Freq: Three times a day (TID) | ORAL | Status: DC
  Administered 2014-04-21 – 2014-04-30 (×17): 237 mL via ORAL

## 2014-04-21 NOTE — Progress Notes (Signed)
PROGRESS NOTE    [name] IOE:[id number] DOB: 03-May-1933 DOA: 04/19/2014 PCP: [name], MD  Medical Oncology: Dr. Curt Bears Radiation Oncology:  HPI/Brief narrative 81 year old male with history of metastatic non-small cell lung cancer, adenocarcinoma with brain metastasis status post posterior tactic radiotherapy to the brain lesions followed by palliative radiotherapy to the left lower lobe lung mass, currently undergoing systemic maintenance chemotherapy with Alimta-completed 15th cycle on 3/29, COPD, admitted to [facility] on 04/19/14 with complaints of swelling and redness of RLE. Lower extremity venous Dopplers negative for DVT. Admitted for cellulitis.   Assessment/Plan:  Principal Problem:   Cellulitis of leg, right - Started empirically on IV ceftriaxone. - Bilateral lower extremity venous Dopplers negative for DVT. - Reassessment on 4/6 shows findings suspicious for cellulitis in both legs without significant improvement in right lower extremity as per patient and family. - Rocephin discontinued and started IV vancomycin on 4/6. - Monitor clinically.  Active Problems:   CKD (chronic kidney disease), stage 3 - Creatinine at baseline. - Follow BMP.    Antineoplastic chemotherapy induced pancytopenia - Last chemotherapy 3/29. - Transfuse 1 unit PRBC on 4/6 for hemoglobin of 7. - DC heparin secondary to platelet count of 40 K. - No reported bleeding. Platelet transfusion if active bleeding or platelet count <10 K    Pericardial effusion without cardiac tamponade - 2-D echo shows small to moderate free-flowing pericardial effusion - Patient denies chest pain or dyspnea. Did report some dyspnea on admission which is probably multifactorial from lung mass, anemia. - No clinical features suggestive of cardiac tamponade. Monitor closely.    COPD (chronic obstructive pulmonary disease) - Stable      Primary cancer of right lower lobe of lung -  Discussed patient's admission and care with his primary oncologist on 4/6    Tachycardia - Asymptomatic and mild - 2-D echo with normal EF. - Likely secondary to anemia and ongoing infection. - Continue monitoring on telemetry.    Oral thrush - Continue nystatin    Protein-calorie malnutrition, severe - Management per dietitian.    Hypokalemia - We'll replace and follow.     Dyspnea - Multifactorial as stated above - VQ scan: Low probability for PE  - Improved/resolved   Code Status: Full Family Communication: Discussed with spouse at bedside Disposition Plan: DC home in 3-4 days.   Consultants:  None  Procedures:   2 D Echo: Study Conclusions  - Left ventricle: The cavity size was normal. There was mild concentric hypertrophy. Systolic function was normal. The estimated ejection fraction was in the range of 55% to 60%. Wall motion was normal; there were no regional wall motion abnormalities. There was an increased relative contribution of atrial contraction to ventricular filling. Doppler parameters are consistent with abnormal left ventricular relaxation (grade 1 diastolic dysfunction). - Aortic valve: Moderate diffuse thickening and calcification, consistent with sclerosis. - Aorta: Aortic root dimension: 44 mm (ED). - Aortic root: The aortic root was mildly dilated. - Mitral valve: Calcified annulus. - Pulmonic valve: There was trivial regurgitation. - Pericardium, extracardiac: A small to moderate, free-flowing pericardial effusion was identified along the left ventricular free wall and at the apex. The fluid had no internal echoes.  Antibiotics:  IV Rocephin-DC'd 4/6  IV vancomycin 4/6 >   Subjective: Patient states that his bilateral leg swelling has improved but the redness has either not changed or is worse according to his wife. Denies dyspnea or chest pain.  Objective: Filed Vitals:  04/21/14 0500 04/21/14 1426 04/21/14  1540 04/21/14 1628  BP: 100/51 109/50 115/63 106/55  Pulse: 107 104 106 100  Temp: 98.2 F (36.8 C) 99 F (37.2 C) 98.4 F (36.9 C) 98 F (36.7 C)  TempSrc: Oral Oral Oral Oral  Resp: 22 20 20 22   Height:      Weight:      SpO2: 100% 99% 99% 100%    Intake/Output Summary (Last 24 hours) at 04/21/14 1747 Last data filed at 04/21/14 1618  Gross per 24 hour  Intake    791 ml  Output   1950 ml  Net  -1159 ml   Filed Weights   04/19/14 2105  Weight: 72.8 kg (160 lb 7.9 oz)     Exam:  General exam: Pleasant elderly frail male lying comfortably supine in bed. Respiratory system: Clear. No increased work of breathing. Cardiovascular system: S1 & S2 heard, RRR. No JVD, murmurs, gallops, clicks or pedal edema. Telemetry: Sinus rhythm. Occasional sinus tachycardia in the 110s. Gastrointestinal system: Abdomen is nondistended, soft and nontender. Normal bowel sounds heard. Central nervous system: Alert and oriented. No focal neurological deficits. Extremities: Symmetric 5 x 5 power. Right leg appears mildly asymmetrically swollen compared to left. Both legs are warm to touch below knees. Bilateral patchy erythema extending from lower two thirds of the leg to dorsum of feet, right >left. No tenderness. No fluctuation or crepitus. No open wounds.   Data Reviewed: Basic Metabolic Panel:  Recent Labs Lab 04/19/14 1453 04/21/14 0450  NA 137 137  K 3.7 3.0*  CL 101 104  CO2 23 24  GLUCOSE 153* 124*  BUN 36* 31*  CREATININE 1.83* 1.61*  CALCIUM 8.4 8.0*   Liver Function Tests:  Recent Labs Lab 04/19/14 1453  AST 54*  ALT 43  ALKPHOS 209*  BILITOT 2.1*  PROT 6.1  ALBUMIN 2.8*    Recent Labs Lab 04/19/14 1453  LIPASE 19   No results for input(s): AMMONIA in the last 168 hours. CBC:  Recent Labs Lab 04/19/14 1453 04/21/14 0450  WBC 9.9 2.4*  NEUTROABS 9.5*  --   HGB 8.7* 7.0*  HCT 25.7* 20.1*  MCV 97.0 96.2  PLT 98* 41*   Cardiac Enzymes:  Recent  Labs Lab 04/19/14 1524  TROPONINI <0.03   BNP (last 3 results) No results for input(s): PROBNP in the last 8760 hours. CBG: No results for input(s): GLUCAP in the last 168 hours.  Recent Results (from the past 240 hour(s))  Culture, blood (routine x 2)     Status: None (Preliminary result)   Collection Time: 04/19/14 10:25 PM  Result Value Ref Range Status   Specimen Description BLOOD LEFT ANTECUBITAL  Final   Special Requests BOTTLES DRAWN AEROBIC AND ANAEROBIC 10CC  Final   Culture   Final           BLOOD CULTURE RECEIVED NO GROWTH TO DATE CULTURE WILL BE HELD FOR 5 DAYS BEFORE ISSUING A FINAL NEGATIVE REPORT Note: Culture results may be compromised due to an excessive volume of blood received in culture bottles. Performed at [facility]    Report Status PENDING  Incomplete  Culture, blood (routine x 2)     Status: None (Preliminary result)   Collection Time: 04/19/14 10:30 PM  Result Value Ref Range Status   Specimen Description BLOOD RIGHT ANTECUBITAL  Final   Special Requests BOTTLES DRAWN AEROBIC AND ANAEROBIC 5CC  Final   Culture   Final  BLOOD CULTURE RECEIVED NO GROWTH TO DATE CULTURE WILL BE HELD FOR 5 DAYS BEFORE ISSUING A FINAL NEGATIVE REPORT Performed at [facility]    Report Status PENDING  Incomplete          Studies: Nm Pulmonary Perf And Vent  04/19/2014   CLINICAL DATA:  Shortness of breath.  EXAM: NUCLEAR MEDICINE VENTILATION - PERFUSION LUNG SCAN  TECHNIQUE: Ventilation images were obtained in multiple projections using inhaled aerosol technetium 99 M DTPA. Perfusion images were obtained in multiple projections after intravenous injection of Tc-78m MAA.  RADIOPHARMACEUTICALS:  40.0 MCi Tc-19m DTPA aerosol and 5.0 mCi Tc-20m MAA  COMPARISON:  Chest x-ray 04/19/2014 and CT chest 03/04/2014  FINDINGS: Ventilation: Patchy heterogeneous ventilation study with some linear defects likely corresponding to pleural effusions and lung  disease.  Perfusion: Patchy heterogeneous perfusion scan matching the ventilation scan. No mismatching segmental or subsegmental defects to suggest pulmonary embolism.  IMPRESSION: Low probability ventilation perfusion lung scan for pulmonary embolism.   Electronically Signed   By: Marijo Sanes M.D.   On: 04/19/2014 19:10        Scheduled Meds: . antiseptic oral rinse  7 mL Mouth Rinse q12n4p  . chlorhexidine  15 mL Mouth Rinse BID  . feeding supplement (ENSURE ENLIVE)  237 mL Oral TID BM  . ferrous sulfate  325 mg Oral Q breakfast  . folic acid  219 mcg Oral Daily  . megestrol  400 mg Oral BID  . nystatin  5 mL Oral QID  . polyvinyl alcohol   Both Eyes Daily  . simvastatin  10 mg Oral QHS  . vancomycin  1,250 mg Intravenous Q24H   Continuous Infusions:   Principal Problem:   Cellulitis of leg, right Active Problems:   Primary cancer of right lower lobe of lung   Tachycardia   CKD (chronic kidney disease)   Oral thrush   Protein-calorie malnutrition, severe    Time spent: 40 minutes.    Vernell Leep, MD, FACP, FHM. [facility] Pager [phone]  If 7PM-7AM, please contact night-coverage [email or web address] Password [email or web address] 04/21/2014, 5:47 PM    LOS: 2 days

## 2014-04-21 NOTE — Progress Notes (Signed)
ANTIBIOTIC CONSULT NOTE - INITIAL  Pharmacy Consult for vancomycin Indication: cellulitis  Allergies  Allergen Reactions  . Asa [Aspirin] Nausea Only  . Sulfa Antibiotics Nausea And Vomiting    Patient Measurements: Height: 5\' 11"  (180.3 cm) Weight: 160 lb 7.9 oz (72.8 kg) IBW/kg (Calculated) : 75.3  Vital Signs: Temp: 98.2 F (36.8 C) (04/06 0500) Temp Source: Oral (04/06 0500) BP: 100/51 mmHg (04/06 0500) Pulse Rate: 107 (04/06 0500) Intake/Output from previous day: 04/05 0701 - 04/06 0700 In: 480 [P.O.:480] Out: 2150 [Urine:2150] Intake/Output from this shift: Total I/O In: -  Out: 500 [Urine:500]  Labs:  Recent Labs  04/19/14 1453 04/21/14 0450  WBC 9.9 2.4*  HGB 8.7* 7.0*  PLT 98* 41*  CREATININE 1.83* 1.61*   Estimated Creatinine Clearance: 38.9 mL/min (by C-G formula based on Cr of 1.61). No results for input(s): VANCOTROUGH, VANCOPEAK, VANCORANDOM, GENTTROUGH, GENTPEAK, GENTRANDOM, TOBRATROUGH, TOBRAPEAK, TOBRARND, AMIKACINPEAK, AMIKACINTROU, AMIKACIN in the last 72 hours.   Microbiology: Recent Results (from the past 720 hour(s))  Culture, blood (routine x 2)     Status: None (Preliminary result)   Collection Time: 04/19/14 10:25 PM  Result Value Ref Range Status   Specimen Description BLOOD LEFT ANTECUBITAL  Final   Special Requests BOTTLES DRAWN AEROBIC AND ANAEROBIC 10CC  Final   Culture   Final           BLOOD CULTURE RECEIVED NO GROWTH TO DATE CULTURE WILL BE HELD FOR 5 DAYS BEFORE ISSUING A FINAL NEGATIVE REPORT Note: Culture results may be compromised due to an excessive volume of blood received in culture bottles. Performed at [facility]    Report Status PENDING  Incomplete  Culture, blood (routine x 2)     Status: None (Preliminary result)   Collection Time: 04/19/14 10:30 PM  Result Value Ref Range Status   Specimen Description BLOOD RIGHT ANTECUBITAL  Final   Special Requests BOTTLES DRAWN AEROBIC AND ANAEROBIC 5CC  Final   Culture   Final           BLOOD CULTURE RECEIVED NO GROWTH TO DATE CULTURE WILL BE HELD FOR 5 DAYS BEFORE ISSUING A FINAL NEGATIVE REPORT Performed at [facility]    Report Status PENDING  Incomplete    Medical History: Past Medical History  Diagnosis Date  . COPD (chronic obstructive pulmonary disease)   . Allergy     allergic rhinitis  . Hx of radiation therapy 12/09/12-01/01/13    lung 37.5Gy  . nscl ca w/ brain mets dx'd 08/2012    Patient has a lung mass which is being evaluated    Assessment: Patient is a 81 y.o M with lung cancer and brain mets currently on chemotherapy.  He presented to [facility] ED on 4/4 with c/o LE swelling and erythema and was started on ceftriaxone for cellulitis.  To change abx to vancomycin today per Dr. Algis Liming.  4/4 CTX>4/6 4/6 vanc>>  4/4 bcx x2>> ngtd  Tmax: 98.9 WBC: 2.4 Scr: down 1.61 (crcl~ 39)   Goal of Therapy:  Vancomycin trough level 10-15 mcg/ml  Plan:  - vancomycin 1250mg  IV q24h - monitor renal function and adjust dose when appropriate  Johney Perotti P 04/21/2014,12:08 PM

## 2014-04-22 DIAGNOSIS — I319 Disease of pericardium, unspecified: Secondary | ICD-10-CM

## 2014-04-22 DIAGNOSIS — L03119 Cellulitis of unspecified part of limb: Secondary | ICD-10-CM

## 2014-04-22 LAB — CBC WITH DIFFERENTIAL/PLATELET
Basophils Absolute: 0 10*3/uL (ref 0.0–0.1)
Basophils Relative: 1 % (ref 0–1)
EOS ABS: 0.2 10*3/uL (ref 0.0–0.7)
EOS PCT: 9 % — AB (ref 0–5)
HCT: 21 % — ABNORMAL LOW (ref 39.0–52.0)
Hemoglobin: 7.2 g/dL — ABNORMAL LOW (ref 13.0–17.0)
LYMPHS PCT: 16 % (ref 12–46)
Lymphs Abs: 0.3 10*3/uL — ABNORMAL LOW (ref 0.7–4.0)
MCH: 32.1 pg (ref 26.0–34.0)
MCHC: 34.3 g/dL (ref 30.0–36.0)
MCV: 93.8 fL (ref 78.0–100.0)
Monocytes Absolute: 0 10*3/uL — ABNORMAL LOW (ref 0.1–1.0)
Monocytes Relative: 1 % — ABNORMAL LOW (ref 3–12)
Neutro Abs: 1.2 10*3/uL — ABNORMAL LOW (ref 1.7–7.7)
Neutrophils Relative %: 73 % (ref 43–77)
Platelets: 23 10*3/uL — CL (ref 150–400)
RBC: 2.24 MIL/uL — AB (ref 4.22–5.81)
RDW: 17.7 % — ABNORMAL HIGH (ref 11.5–15.5)
WBC: 1.7 10*3/uL — ABNORMAL LOW (ref 4.0–10.5)

## 2014-04-22 LAB — BASIC METABOLIC PANEL
Anion gap: 9 (ref 5–15)
BUN: 30 mg/dL — ABNORMAL HIGH (ref 6–23)
CO2: 23 mmol/L (ref 19–32)
Calcium: 8.1 mg/dL — ABNORMAL LOW (ref 8.4–10.5)
Chloride: 107 mmol/L (ref 96–112)
Creatinine, Ser: 1.64 mg/dL — ABNORMAL HIGH (ref 0.50–1.35)
GFR calc Af Amer: 45 mL/min — ABNORMAL LOW (ref >90)
GFR, EST NON AFRICAN AMERICAN: 38 mL/min — AB (ref >90)
GLUCOSE: 132 mg/dL — AB (ref 70–99)
Potassium: 3.1 mmol/L — ABNORMAL LOW (ref 3.5–5.1)
Sodium: 139 mmol/L (ref 135–145)

## 2014-04-22 LAB — PREPARE RBC (CROSSMATCH)

## 2014-04-22 MED ORDER — POTASSIUM CHLORIDE CRYS ER 20 MEQ PO TBCR
40.0000 meq | EXTENDED_RELEASE_TABLET | Freq: Once | ORAL | Status: AC
  Administered 2014-04-22: 40 meq via ORAL
  Filled 2014-04-22: qty 2

## 2014-04-22 MED ORDER — ACETAMINOPHEN 500 MG PO TABS
500.0000 mg | ORAL_TABLET | Freq: Four times a day (QID) | ORAL | Status: DC | PRN
  Administered 2014-04-23 – 2014-05-02 (×8): 500 mg via ORAL
  Filled 2014-04-22 (×8): qty 1

## 2014-04-22 MED ORDER — FUROSEMIDE 10 MG/ML IJ SOLN
20.0000 mg | Freq: Once | INTRAMUSCULAR | Status: AC
  Administered 2014-04-22: 20 mg via INTRAVENOUS
  Filled 2014-04-22: qty 2

## 2014-04-22 MED ORDER — SODIUM CHLORIDE 0.9 % IV SOLN
Freq: Once | INTRAVENOUS | Status: AC
  Administered 2014-04-22: 10 mL/h via INTRAVENOUS

## 2014-04-22 NOTE — Consult Note (Signed)
CARDIOLOGY CONSULT NOTE     Patient ID: [name] MRN: [id number] DOB/AGE: 1933-11-06 81 y.o.  Admit date: 04/19/2014 Referring Physician Adan Sis MD Primary Physician [name], MD Primary Cardiologist N/A Reason for Consultation pericardial effusion  HPI:  Pleasant 81 yo WM seen at the request of Dr. Lindaann Pascal for evaluation of a pericardial effusion. 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 No chest pain. He was noted on Echo to have a small to moderate pericardial effusion. No old Echos for comparison. He denies any chest pain. He does feel that his chemo wipes him out. He has had prior RT to brain and lung. No known cardiac problems. He reports that he had a cardiac cath over 10 years ago with normal findings. BP is stable. He did undergo a right thoracentesis in Feb  2016 with 630 cc of fluid removed. Cytology showed reactive mesothelial cells.   Past Medical History  Diagnosis Date  . COPD (chronic obstructive pulmonary disease)   . Allergy     allergic rhinitis  . Hx of radiation therapy 12/09/12-01/01/13    lung 37.5Gy  . nscl ca w/ brain mets dx'd 08/2012    Patient has a lung mass which is being evaluated    Family History  Problem Relation Age of Onset  . Cancer Mother     breast  . Stroke Father     History   Social History  . Marital Status: Married    Spouse Name: N/A  . Number of Children: N/A  . Years of Education: N/A   Occupational History  . Not on file.   Social History Main Topics  . Smoking status: Former Smoker    Quit date: 09/11/1998  . Smokeless tobacco: Never Used  . Alcohol Use: No  . Drug Use: No  . Sexual Activity: Not on file   Other Topics Concern  . Not on file   Social History Narrative    Past Surgical History  Procedure Laterality Date  . Cholecystectomy       Prescriptions prior to admission    Medication Sig Dispense Refill Last Dose  . acetaminophen (TYLENOL) 500 MG tablet Take 500 mg by mouth every 6 (six) hours as needed for mild pain.    04/19/2014 at Unknown time  . Alum & Mag Hydroxide-Simeth (MAGIC MOUTHWASH) SOLN Take 5 mLs by mouth 3 (three) times daily as needed for mouth pain.   04/18/2014 at Unknown time  . clobetasol cream (TEMOVATE) 6.26 % Apply 1 application topically daily as needed (for itching).   04/18/2014 at Unknown time  . dexamethasone (DECADRON) 4 MG tablet 4 mg by mouth twice a day the day before, day of and day after the chemotherapy every 3 weeks 40 tablet 1 04/14/2014  . feeding supplement, ENSURE COMPLETE, (ENSURE COMPLETE) LIQD Take 237 mLs by mouth 3 (three) times daily between meals.   04/19/2014 at Unknown time  . ferrous sulfate (IRON SUPPLEMENT) 325 (65 FE) MG tablet Take 325 mg by mouth daily with breakfast.   04/18/2014 at Unknown time  . folic acid (FOLVITE) 948 MCG tablet Take 400 mcg by mouth every morning.   04/18/2014 at Unknown time  . furosemide (LASIX) 20 MG tablet Take 1 tablet by mouth daily.   04/18/2014 at Unknown time  . megestrol (MEGACE) 40 MG tablet Take 1 tablet (40 mg total) by mouth daily.  30 tablet 0 04/18/2014 at Unknown time  . PEMEtrexed Disodium (ALIMTA IV) Inject into the vein every 21 ( twenty-one) days.   04/13/2014  . Polyethyl Glycol-Propyl Glycol (SYSTANE OP) Place 1 drop into both eyes daily.   04/18/2014 at Unknown time  . simvastatin (ZOCOR) 10 MG tablet Take 10 mg by mouth at bedtime.   04/18/2014 at Unknown time  . [DISCONTINUED] simvastatin (ZOCOR) 10 MG tablet TAKE 1 TABLET (10 MG TOTAL) BY MOUTH AT BEDTIME. 90 tablet 1 04/18/2014 at Unknown time      ROS: As noted in HPI. All other systems are reviewed and are negative unless otherwise mentioned.   Physical Exam: Blood pressure 127/83, pulse 100, temperature 97.7 F (36.5 C), temperature source Oral, resp. rate 18, height 5\' 11"  (1.803 m), weight 160 lb 7.9 oz (72.8 kg), SpO2 99 %.  Current Weight  04/19/14 160 lb 7.9 oz (72.8 kg)  04/13/14 161 lb 3.2 oz (73.12 kg)  03/16/14 151 lb 14.4 oz (68.901 kg)    GENERAL:  Well appearing elderly WM in NAD HEENT:  PERRL, EOMI, sclera are clear. Oropharynx is clear. NECK:  No jugular venous distention, carotid upstroke brisk and symmetric, no bruits, no thyromegaly or adenopathy LUNGS:  Clear to auscultation bilaterally CHEST:  Unremarkable HEART:  RRR,  PMI not displaced or sustained,S1 and S2 within normal limits, no S3, no S4: no clicks, no rubs, no murmurs ABD:  Soft, nontender. BS +, no masses or bruits. No hepatomegaly, no splenomegaly EXT:  2 + pulses throughout, bilateral LE edema with erythema RLE SKIN:  Warm and dry.  No rashes NEURO:  Alert and oriented x 3. Cranial nerves II through XII intact. PSYCH:  Cognitively intact    Labs:   Lab Results  Component Value Date   WBC 1.7* 04/22/2014   HGB 7.2* 04/22/2014   HCT 21.0* 04/22/2014   MCV 93.8 04/22/2014   PLT 23* 04/22/2014    Recent Labs Lab 04/19/14 1453  04/22/14 0519  NA 137  < > 139  K 3.7  < > 3.1*  CL 101  < > 107  CO2 23  < > 23  BUN 36*  < > 30*  CREATININE 1.83*  < > 1.64*  CALCIUM 8.4  < > 8.1*  PROT 6.1  --   --   BILITOT 2.1*  --   --   ALKPHOS 209*  --   --   ALT 43  --   --   AST 54*  --   --   GLUCOSE 153*  < > 132*  < > = values in this interval not displayed. Lab Results  Component Value Date   TROPONINI <0.03 04/19/2014   Lab Results  Component Value Date   CHOL 125 09/08/2012   CHOL * 06/12/2007    206        ATP III CLASSIFICATION:  <200     mg/dL   Desirable  200-239  mg/dL   Borderline High  >=240    mg/dL   High   Lab Results  Component Value Date   HDL 39* 09/08/2012   HDL 27* 06/12/2007   Lab Results  Component Value Date   LDLCALC 68 09/08/2012   LDLCALC * 06/12/2007    157        Total Cholesterol/HDL:CHD Risk Coronary Heart Disease Risk Table                     Men  Women  1/2 Average Risk    3.4   3.3   Lab Results  Component Value Date   TRIG 89 09/08/2012   TRIG 110 06/12/2007   Lab Results  Component Value Date   CHOLHDL 3.2 09/08/2012   CHOLHDL 7.6 06/12/2007   No results found for: LDLDIRECT  No results found for: PROBNP Lab Results  Component Value Date   TSH 3.300 01/16/2014   No results found for: HGBA1C  Radiology: No results found.  EKG: NSR with low voltage and possible old anterior MI (poor R wave progression) I have personally reviewed and interpreted this study.  Echo: Study Conclusions I have personally reviewed and interpreted this study.   - Left ventricle: The cavity size was normal. There was mild concentric hypertrophy. Systolic function was normal. The estimated ejection fraction was in the range of 55% to 60%. Wall motion was normal; there were no regional wall motion abnormalities. There was an increased relative contribution of atrial contraction to ventricular filling. Doppler parameters are consistent with abnormal left ventricular relaxation (grade 1 diastolic dysfunction). - Aortic valve: Moderate diffuse thickening and calcification, consistent with sclerosis. - Aorta: Aortic root dimension: 44 mm (ED). - Aortic root: The aortic root was mildly dilated. - Mitral valve: Calcified annulus. - Pulmonic valve: There was trivial regurgitation. - Pericardium, extracardiac: A small to moderate, free-flowing pericardial effusion was identified along the left ventricular free wall and at the apex. The fluid had no internal echoes.   ASSESSMENT AND PLAN:  1. Pericaridal effusion with small to moderate effusion. This appears to be chronic and is present on chest CT at least back to June 2015. There is no evidence of tamponade and he is asymptomatic. This is likely similar in etiology to pleural effusion that was tapped in February.  There is no indication for pericardiocentesis.  2. Severe anemia 3. RLE  cellulitis. 4. Dyspnea most likely related to anemia. 5. Metastatic small cell lung CA with brain mets. 6. CKD stage 3.  No further cardiac work up needed. Pericardial effusion can be followed on CT since he will have follow up studies routinely. If there is a significant increase in size then I would repeat Echo. Please call if there are other questions.   Signed: Lothar Prehn [place], [place]  04/22/2014, 10:17 AM

## 2014-04-22 NOTE — Progress Notes (Signed)
CARE MANAGEMENT NOTE 04/22/2014  Patient:  [name], [name]   Account Number:  [id number]  Date Initiated:  04/22/2014  Documentation initiated by:  Edwyna Shell  Subjective/Objective Assessment:   81 yo male admitted with cellulitis of bilateral legs, left greater than right from home with spouse     Action/Plan:   discharge planning   Anticipated DC Date:  04/24/2014   Anticipated DC Plan:  [place]  CM consult      Choice offered to / List presented to:             Status of service:  Completed, signed off Medicare Important Message given?  YES (If response is "NO", the following Medicare IM given date fields will be blank) Date Medicare IM given:  04/22/2014 Medicare IM given by:  Edwyna Shell Date Additional Medicare IM given:   Additional Medicare IM given by:    Discharge Disposition:  HOME/SELF CARE  Per UR Regulation:    If discussed at Long Length of Stay Meetings, dates discussed:    Comments:  04/22/14 Edwyna Shell RN BSN CM [phone] Patient stated that he recently started using a cane but has no other DME, has PCP, pharmacy, and lives independently with his spouse.

## 2014-04-22 NOTE — Progress Notes (Signed)
NUTRITION FOLLOW-UP  DOCUMENTATION CODES Per approved criteria  -Severe malnutrition in the context of chronic illness   INTERVENTION: Continue Ensure Enlive po TID, each supplement provides 350 kcal and 20 grams of protein (discussed with pt how many kcal/protein these would provide him).   Encouraged food from home.   NUTRITION DIAGNOSIS: Malnutrition related to chronic illness as evidenced by severe fat and muscle depletion; ongoing.   Goal: Pt to meet >/= 90% of their estimated nutrition needs; not met.   Monitor:  PO intake, supplement acceptance, weight trends, labs  ASSESSMENT: Pt with hx of NSCLC with mets to brain admitted with cellulitis of right leg now on antibiotics.   Pt now with bilateral cellulitis. Have not yet met any family.  Continues to c/o about the food here. He ate 100% of his Breakfast which was 2 pancakes. Pt did not want to order any lunch today but ok with me brining him a chocolate ensure which he seemed to enjoy. Offered to order his lunch but he was not interested. Family brought him a cheeseburger from home and ate most of it last night.   Pt on medication for thrush.   Spoke with MD 4/5 and Megace 400 mg BID ordered by MD.  Potassium low.   Height: Ht Readings from Last 1 Encounters:  04/19/14 $RemoveB'5\' 11"'tZXMEPBF$  (1.803 m)    Weight: Wt Readings from Last 1 Encounters:  04/19/14 160 lb 7.9 oz (72.8 kg)    BMI:  Body mass index is 22.39 kg/(m^2).  Estimated Nutritional Needs: Kcal: 2100-2300 Protein: 100-115 grams Fluid: >2.1 L/day  Skin: cellulitis   Diet Order: Diet regular Room service appropriate?: Yes; Fluid consistency:: Thin   Intake/Output Summary (Last 24 hours) at 04/22/14 1357 Last data filed at 04/22/14 1021  Gross per 24 hour  Intake   1430 ml  Output   2525 ml  Net  -1095 ml    Last BM: 4/4   Labs:   Recent Labs Lab 04/19/14 1453 04/21/14 0450 04/22/14 0519  NA 137 137 139  K 3.7 3.0* 3.1*  CL 101 104 107  CO2  $Re'23 24 23  'cjd$ BUN 36* 31* 30*  CREATININE 1.83* 1.61* 1.64*  CALCIUM 8.4 8.0* 8.1*  GLUCOSE 153* 124* 132*    CBG (last 3)  No results for input(s): GLUCAP in the last 72 hours.  Scheduled Meds: . antiseptic oral rinse  7 mL Mouth Rinse q12n4p  . chlorhexidine  15 mL Mouth Rinse BID  . feeding supplement (ENSURE ENLIVE)  237 mL Oral TID BM  . ferrous sulfate  325 mg Oral Q breakfast  . folic acid  592 mcg Oral Daily  . megestrol  400 mg Oral BID  . nystatin  5 mL Oral QID  . polyvinyl alcohol   Both Eyes Daily  . simvastatin  10 mg Oral QHS  . vancomycin  1,250 mg Intravenous Q24H    Continuous Infusions:   Maylon Peppers RD, LDN, CNSC [phone] Pager [phone] After Hours Pager

## 2014-04-22 NOTE — Progress Notes (Signed)
PROGRESS NOTE    [name] XBM:[id number] DOB: 1933/02/20 DOA: 04/19/2014 PCP: [name], MD  Medical Oncology: Dr. Curt Bears Radiation Oncology:  HPI/Brief narrative 81 year old male with history of metastatic non-small cell lung cancer, adenocarcinoma with brain metastasis status post posterior tactic radiotherapy to the brain lesions followed by palliative radiotherapy to the left lower lobe lung mass, currently undergoing systemic maintenance chemotherapy with Alimta-completed 15th cycle on 3/29, COPD, admitted to [facility] on 04/19/14 with complaints of swelling and redness of RLE. Lower extremity venous Dopplers negative for DVT. Admitted for cellulitis.   Assessment/Plan:  Principal Problem:   Cellulitis of legs, R>L - Initially started empirically on IV ceftriaxone for cellulitis of RLE. - Bilateral lower extremity venous Dopplers negative for DVT. - Reassessment on 4/6 showed findings suspicious for cellulitis in both legs without significant improvement in right lower extremity as per patient and family. - Rocephin discontinued and started IV vancomycin on 4/6. - Monitor clinically. - Patient has 1+ pitting bilateral leg edema, R >L. Rest of cellulitis-like changes do not seem a whole lot better compared to yesterday.? Some of his findings related to petechia from thrombocytopenia. - We will provide a dose of IV Lasix 1 and reassess in a.m. - Blood cultures 2: Negative to date  Active Problems:   CKD (chronic kidney disease), stage 3 - Creatinine at baseline. - Follow BMP.    Antineoplastic chemotherapy induced pancytopenia - Last chemotherapy 3/29. - S/P 1 unit PRBC on 4/6 for hemoglobin of 7 > only improved to 7.2. Transfuse additional 2 units PRBC's and follow CBC. - DC'ed heparin secondary to platelet count of 40 K on 4/6. 23 K on 4/7 - No reported bleeding. Platelet transfusion if active bleeding or platelet count <10 K - WBC : 1.7 and ANC  1.2. Monitor and if drops further, may need Neupogen    Pericardial effusion without cardiac tamponade - 2-D echo shows small to moderate free-flowing pericardial effusion - Patient denies chest pain or dyspnea. Did report some dyspnea on admission which is probably multifactorial from lung mass, anemia. - No clinical features suggestive of cardiac tamponade. Monitor closely. - Cardiology consultation appreciated-no intervention needed at this time.    COPD (chronic obstructive pulmonary disease) - Stable      Primary cancer of right lower lobe of lung - Discussed patient's admission and care with his primary oncologist on 4/6    Tachycardia - Asymptomatic and mild - 2-D echo with normal EF. - Likely secondary to anemia and ongoing infection. - Continue monitoring on telemetry.    Oral thrush - Continue nystatin    Protein-calorie malnutrition, severe - Management per dietitian.    Hypokalemia - We'll replace and follow.     Dyspnea - Multifactorial as stated above - VQ scan: Low probability for PE  - Improved/resolved   Code Status: Full Family Communication: Discussed with spouse at bedside 4/8 Disposition Plan: DC home in 3-4 days, when medically stable.   Consultants:  Cardiology  Procedures:   2 D Echo: Study Conclusions  - Left ventricle: The cavity size was normal. There was mild concentric hypertrophy. Systolic function was normal. The estimated ejection fraction was in the range of 55% to 60%. Wall motion was normal; there were no regional wall motion abnormalities. There was an increased relative contribution of atrial contraction to ventricular filling. Doppler parameters are consistent with abnormal left ventricular relaxation (grade 1 diastolic dysfunction). - Aortic valve: Moderate diffuse thickening and calcification, consistent  with sclerosis. - Aorta: Aortic root dimension: 44 mm (ED). - Aortic root: The aortic root was  mildly dilated. - Mitral valve: Calcified annulus. - Pulmonic valve: There was trivial regurgitation. - Pericardium, extracardiac: A small to moderate, free-flowing pericardial effusion was identified along the left ventricular free wall and at the apex. The fluid had no internal echoes.  Antibiotics:  IV Rocephin-DC'd 4/6  IV vancomycin 4/6 >   Subjective: States that his leg pains are better. No chest pain or dyspnea reported.  Objective: Filed Vitals:   04/21/14 2107 04/22/14 0516 04/22/14 1007 04/22/14 1036  BP: 119/66 106/55 127/83 108/62  Pulse: 119 112 100 109  Temp: 98.2 F (36.8 C) 98.5 F (36.9 C) 97.7 F (36.5 C) 98.2 F (36.8 C)  TempSrc: Oral Oral Oral Oral  Resp: 20 20 18 18   Height:      Weight:      SpO2: 98% 97% 99% 99%    Intake/Output Summary (Last 24 hours) at 04/22/14 1400 Last data filed at 04/22/14 1021  Gross per 24 hour  Intake   1430 ml  Output   2525 ml  Net  -1095 ml   Filed Weights   04/19/14 2105  Weight: 72.8 kg (160 lb 7.9 oz)     Exam:  General exam: Pleasant elderly frail male lying comfortably supine in bed. Respiratory system: Clear. No increased work of breathing. Cardiovascular system: S1 & S2 heard, RRR. No JVD, murmurs, gallops, clicks or pedal edema. Telemetry: Sinus rhythm. Occasional sinus tachycardia in the 110s. Gastrointestinal system: Abdomen is nondistended, soft and nontender. Normal bowel sounds heard. Central nervous system: Alert and oriented. No focal neurological deficits. Extremities: Symmetric 5 x 5 power. Right leg appears mildly asymmetrically swollen compared to left. Both legs are warm to touch below knees. Bilateral patchy erythema extending from lower two thirds of the leg to dorsum of feet, right >left. Minimal tenderness. No fluctuation or crepitus. No open wounds. ? Petechiae   Data Reviewed: Basic Metabolic Panel:  Recent Labs Lab 04/19/14 1453 04/21/14 0450 04/22/14 0519  NA 137 137  139  K 3.7 3.0* 3.1*  CL 101 104 107  CO2 23 24 23   GLUCOSE 153* 124* 132*  BUN 36* 31* 30*  CREATININE 1.83* 1.61* 1.64*  CALCIUM 8.4 8.0* 8.1*   Liver Function Tests:  Recent Labs Lab 04/19/14 1453  AST 54*  ALT 43  ALKPHOS 209*  BILITOT 2.1*  PROT 6.1  ALBUMIN 2.8*    Recent Labs Lab 04/19/14 1453  LIPASE 19   No results for input(s): AMMONIA in the last 168 hours. CBC:  Recent Labs Lab 04/19/14 1453 04/21/14 0450 04/21/14 2200 04/22/14 0500  WBC 9.9 2.4* 1.9* 1.7*  NEUTROABS 9.5*  --   --  1.2*  HGB 8.7* 7.0* 7.6* 7.2*  HCT 25.7* 20.1* 21.6* 21.0*  MCV 97.0 96.2 93.5 93.8  PLT 98* 41* 28* 23*   Cardiac Enzymes:  Recent Labs Lab 04/19/14 1524  TROPONINI <0.03   BNP (last 3 results) No results for input(s): PROBNP in the last 8760 hours. CBG: No results for input(s): GLUCAP in the last 168 hours.  Recent Results (from the past 240 hour(s))  Culture, blood (routine x 2)     Status: None (Preliminary result)   Collection Time: 04/19/14 10:25 PM  Result Value Ref Range Status   Specimen Description BLOOD LEFT ANTECUBITAL  Final   Special Requests BOTTLES DRAWN AEROBIC AND ANAEROBIC 10CC  Final   Culture  Final           BLOOD CULTURE RECEIVED NO GROWTH TO DATE CULTURE WILL BE HELD FOR 5 DAYS BEFORE ISSUING A FINAL NEGATIVE REPORT Note: Culture results may be compromised due to an excessive volume of blood received in culture bottles. Performed at [facility]    Report Status PENDING  Incomplete  Culture, blood (routine x 2)     Status: None (Preliminary result)   Collection Time: 04/19/14 10:30 PM  Result Value Ref Range Status   Specimen Description BLOOD RIGHT ANTECUBITAL  Final   Special Requests BOTTLES DRAWN AEROBIC AND ANAEROBIC 5CC  Final   Culture   Final           BLOOD CULTURE RECEIVED NO GROWTH TO DATE CULTURE WILL BE HELD FOR 5 DAYS BEFORE ISSUING A FINAL NEGATIVE REPORT Performed at [facility]    Report Status  PENDING  Incomplete          Studies: No results found.      Scheduled Meds: . antiseptic oral rinse  7 mL Mouth Rinse q12n4p  . chlorhexidine  15 mL Mouth Rinse BID  . feeding supplement (ENSURE ENLIVE)  237 mL Oral TID BM  . ferrous sulfate  325 mg Oral Q breakfast  . folic acid  233 mcg Oral Daily  . megestrol  400 mg Oral BID  . nystatin  5 mL Oral QID  . polyvinyl alcohol   Both Eyes Daily  . simvastatin  10 mg Oral QHS  . vancomycin  1,250 mg Intravenous Q24H   Continuous Infusions:   Principal Problem:   Cellulitis of leg, right Active Problems:   CKD (chronic kidney disease)   Antineoplastic chemotherapy induced pancytopenia   Pericardial effusion without cardiac tamponade   COPD (chronic obstructive pulmonary disease)   Primary cancer of right lower lobe of lung   Tachycardia   Oral thrush   Protein-calorie malnutrition, severe    Time spent: 40 minutes.    Vernell Leep, MD, FACP, FHM. [facility] Pager [phone]  If 7PM-7AM, please contact night-coverage [email or web address] Password [email or web address] 04/22/2014, 2:00 PM    LOS: 3 days

## 2014-04-23 ENCOUNTER — Inpatient Hospital Stay (HOSPITAL_COMMUNITY): Payer: Medicare Other

## 2014-04-23 ENCOUNTER — Other Ambulatory Visit: Payer: Medicare Other

## 2014-04-23 ENCOUNTER — Ambulatory Visit (HOSPITAL_COMMUNITY): Payer: Medicare Other

## 2014-04-23 ENCOUNTER — Inpatient Hospital Stay (HOSPITAL_COMMUNITY)
Admission: EM | Admit: 2014-04-23 | Discharge: 2014-04-23 | Disposition: A | Discharge status: Home / Self Care | Payer: Medicare Other | Source: Home / Self Care | Attending: Internal Medicine | Admitting: Internal Medicine

## 2014-04-23 DIAGNOSIS — R569 Unspecified convulsions: Secondary | ICD-10-CM

## 2014-04-23 DIAGNOSIS — I959 Hypotension, unspecified: Secondary | ICD-10-CM

## 2014-04-23 LAB — CBC
HCT: 30 % — ABNORMAL LOW (ref 39.0–52.0)
Hemoglobin: 10.4 g/dL — ABNORMAL LOW (ref 13.0–17.0)
MCH: 30.9 pg (ref 26.0–34.0)
MCHC: 34.7 g/dL (ref 30.0–36.0)
MCV: 89 fL (ref 78.0–100.0)
PLATELETS: 19 10*3/uL — AB (ref 150–400)
RBC: 3.37 MIL/uL — ABNORMAL LOW (ref 4.22–5.81)
RDW: 17.8 % — AB (ref 11.5–15.5)
WBC: 1.2 10*3/uL — CL (ref 4.0–10.5)

## 2014-04-23 LAB — CBC WITH DIFFERENTIAL/PLATELET
BASOS PCT: 1 % (ref 0–1)
Basophils Absolute: 0 10*3/uL (ref 0.0–0.1)
EOS PCT: 13 % — AB (ref 0–5)
Eosinophils Absolute: 0.2 10*3/uL (ref 0.0–0.7)
HEMATOCRIT: 25.4 % — AB (ref 39.0–52.0)
HEMOGLOBIN: 8.9 g/dL — AB (ref 13.0–17.0)
LYMPHS ABS: 0.2 10*3/uL — AB (ref 0.7–4.0)
Lymphocytes Relative: 15 % (ref 12–46)
MCH: 31.6 pg (ref 26.0–34.0)
MCHC: 35 g/dL (ref 30.0–36.0)
MCV: 90.1 fL (ref 78.0–100.0)
Monocytes Absolute: 0 10*3/uL — ABNORMAL LOW (ref 0.1–1.0)
Monocytes Relative: 1 % — ABNORMAL LOW (ref 3–12)
NEUTROS ABS: 1.2 10*3/uL — AB (ref 1.7–7.7)
Neutrophils Relative %: 70 % (ref 43–77)
PLATELETS: 11 10*3/uL — AB (ref 150–400)
RBC: 2.82 MIL/uL — AB (ref 4.22–5.81)
RDW: 18.4 % — ABNORMAL HIGH (ref 11.5–15.5)
WBC: 1.6 10*3/uL — ABNORMAL LOW (ref 4.0–10.5)

## 2014-04-23 LAB — URINE MICROSCOPIC-ADD ON

## 2014-04-23 LAB — COMPREHENSIVE METABOLIC PANEL
ALK PHOS: 187 U/L — AB (ref 39–117)
ALT: 209 U/L — ABNORMAL HIGH (ref 0–53)
AST: 329 U/L — ABNORMAL HIGH (ref 0–37)
Albumin: 2.1 g/dL — ABNORMAL LOW (ref 3.5–5.2)
Anion gap: 8 (ref 5–15)
BUN: 29 mg/dL — AB (ref 6–23)
CALCIUM: 8.2 mg/dL — AB (ref 8.4–10.5)
CO2: 23 mmol/L (ref 19–32)
Chloride: 107 mmol/L (ref 96–112)
Creatinine, Ser: 1.59 mg/dL — ABNORMAL HIGH (ref 0.50–1.35)
GFR calc non Af Amer: 40 mL/min — ABNORMAL LOW (ref >90)
GFR, EST AFRICAN AMERICAN: 46 mL/min — AB (ref >90)
Glucose, Bld: 111 mg/dL — ABNORMAL HIGH (ref 70–99)
Potassium: 3.6 mmol/L (ref 3.5–5.1)
Sodium: 138 mmol/L (ref 135–145)
TOTAL PROTEIN: 5.2 g/dL — AB (ref 6.0–8.3)
Total Bilirubin: 2.8 mg/dL — ABNORMAL HIGH (ref 0.3–1.2)

## 2014-04-23 LAB — URINALYSIS, ROUTINE W REFLEX MICROSCOPIC
Bilirubin Urine: NEGATIVE
Glucose, UA: NEGATIVE mg/dL
HGB URINE DIPSTICK: NEGATIVE
Ketones, ur: NEGATIVE mg/dL
LEUKOCYTES UA: NEGATIVE
Nitrite: NEGATIVE
PROTEIN: NEGATIVE mg/dL
Specific Gravity, Urine: 1.007 (ref 1.005–1.030)
Urobilinogen, UA: 1 mg/dL (ref 0.0–1.0)
pH: 6.5 (ref 5.0–8.0)

## 2014-04-23 LAB — TYPE AND SCREEN
ABO/RH(D): O POS
Antibody Screen: NEGATIVE
UNIT DIVISION: 0
Unit division: 0
Unit division: 0

## 2014-04-23 LAB — MRSA PCR SCREENING: MRSA by PCR: NEGATIVE

## 2014-04-23 LAB — LIPASE, BLOOD: LIPASE: 18 U/L (ref 11–59)

## 2014-04-23 MED ORDER — LORAZEPAM 2 MG/ML IJ SOLN
INTRAMUSCULAR | Status: AC
  Filled 2014-04-23: qty 1

## 2014-04-23 MED ORDER — SODIUM CHLORIDE 0.9 % IV BOLUS (SEPSIS)
500.0000 mL | Freq: Once | INTRAVENOUS | Status: AC
  Administered 2014-04-23: 500 mL via INTRAVENOUS

## 2014-04-23 MED ORDER — FUROSEMIDE 10 MG/ML IJ SOLN: 40.0000 mg | Freq: Once | INTRAMUSCULAR | Status: DC

## 2014-04-23 MED ORDER — SODIUM CHLORIDE 0.9 % IV SOLN: Freq: Once | INTRAVENOUS | Status: DC

## 2014-04-23 MED ORDER — LEVETIRACETAM 500 MG/5ML IV SOLN
1000.0000 mg | Freq: Once | INTRAVENOUS | Status: AC
  Administered 2014-04-23: 1000 mg via INTRAVENOUS
  Filled 2014-04-23: qty 10

## 2014-04-23 MED ORDER — TBO-FILGRASTIM 300 MCG/0.5ML ~~LOC~~ SOSY
300.0000 ug | PREFILLED_SYRINGE | Freq: Every day | SUBCUTANEOUS | Status: AC
  Administered 2014-04-23 – 2014-04-24 (×2): 300 ug via SUBCUTANEOUS
  Filled 2014-04-23 (×3): qty 0.5

## 2014-04-23 MED ORDER — VITAMINS A & D EX OINT
TOPICAL_OINTMENT | CUTANEOUS | Status: AC
  Administered 2014-04-23: 1
  Filled 2014-04-23: qty 5

## 2014-04-23 MED ORDER — DIPHENHYDRAMINE HCL 25 MG PO CAPS
25.0000 mg | ORAL_CAPSULE | Freq: Four times a day (QID) | ORAL | Status: DC | PRN
  Administered 2014-04-23: 25 mg via ORAL
  Filled 2014-04-23: qty 1

## 2014-04-23 MED ORDER — SODIUM CHLORIDE 0.9 % IV SOLN
INTRAVENOUS | Status: DC
  Administered 2014-04-23: 12:00:00 via INTRAVENOUS

## 2014-04-23 MED ORDER — LORAZEPAM 2 MG/ML IJ SOLN
1.0000 mg | INTRAMUSCULAR | Status: DC | PRN
Start: 2014-04-23 — End: 2014-05-02

## 2014-04-23 MED ORDER — LEVETIRACETAM 500 MG/5ML IV SOLN
500.0000 mg | Freq: Two times a day (BID) | INTRAVENOUS | Status: DC
  Administered 2014-04-23 – 2014-04-25 (×4): 500 mg via INTRAVENOUS
  Filled 2014-04-23 (×7): qty 5

## 2014-04-23 MED ORDER — LIP MEDEX EX OINT
TOPICAL_OINTMENT | CUTANEOUS | Status: AC
  Filled 2014-04-23: qty 7

## 2014-04-23 NOTE — Procedures (Signed)
ELECTROENCEPHALOGRAM REPORT  Date of Study: 04/23/2014  Patient's Name: [name] MRN: [id number] Date of Birth: [birth year]  Referring Provider: Dr. Vernell Leep  Clinical History: This is a 81 year old man who had an episode where all 4 extremities stiff, nonverbal, head turned to left and eyes fixed for 15-20 secs with period of apnea during transfusion.   Medications: acetaminophen (TYLENOL) tablet 500 mg ferrous sulfate tablet 035 mg folic acid (FOLVITE) tablet 0.5 mg levETIRAcetam (KEPPRA) 1,000 mg in sodium chloride 0.9 % 100 mL IVPB levETIRAcetam (KEPPRA) 500 mg in sodium chloride 0.9 % 100 mL IVPB LORazepam (ATIVAN) 2 MG/ML injection LORazepam (ATIVAN) injection 1 mg megestrol (MEGACE) 400 MG/10ML suspension 400 mg nystatin (MYCOSTATIN) 100000 UNIT/ML suspension 500,000 Units simvastatin (ZOCOR) tablet 10 mg Tbo-Filgrastim (GRANIX) injection 300 mcg vancomycin (VANCOCIN) 1,250 mg in sodium chloride 0.9 % 250 mL IVPB  Technical Summary: A multichannel digital EEG recording measured by the international 10-20 system with electrodes applied with paste and impedances below 5000 ohms performed in our laboratory with EKG monitoring in an awake patient.  Hyperventilation and photic stimulation were performed.  The digital EEG was referentially recorded, reformatted, and digitally filtered in a variety of bipolar and referential montages for optimal display.    Description: The patient is awake during the recording.  During maximal wakefulness, there is a symmetric, medium voltage 10 Hz posterior dominant rhythm that attenuates with eye opening.  The record is symmetric.  There is an excess amount of diffuse low voltage beta activity seen throughout the recording. There is muscle artifact over the bilateral temporal regions throughout majority of the recording, obscuring the EEG in these regions. In between artifact, there were no epileptiform discharges seen. Hyperventilation and  photic stimulation were not performed..  There were no epileptiform discharges or electrographic seizures seen.    EKG lead showed sinus tachycardia.  Impression: This awake EEG is normal except for excess amount of diffuse low voltage beta activity.  Clinical Correlation: Diffuse low voltage beta activity is commonly seen with sedating medications such as benzodiazepines.  In the absence of sedating medications, anxiety and hyperthyroidism may produce generalized beta activity.  The absence of epileptiform discharges does not exclude a clinical diagnosis of epilepsy. This EEG is slightly limited due to muscle artifact obscuring the temporal regions. If further clinical questions remain, repeat wake and sleep EEG may be helpful.  Clinical correlation is advised.   Ellouise Newer, M.D.

## 2014-04-23 NOTE — Progress Notes (Addendum)
Pt receiving platelet transfusion. RN at bedside during first 15 min.  Platelet transfusion started at 1025, and at 1039, pt was witnessed all 4 extremities stiff, nonverbal, head turned to left and eyes fixed.  [place] outside room and immediately notified. Episode lasted approximately 15-20 secs with period of apnea.  BP 53/31. HR 114. Pt post ictal. 500 cc NS bolus given and pt placed in trendelenburg.  Post bolus BP 115/64 HR 111 temp orally 97.9. And pt arousal and oriented.   Platelets stopped at 1039 and sent to blood bank for transfusion reaction review.

## 2014-04-23 NOTE — Progress Notes (Signed)
CRITICAL VALUE ALERT  Critical value received:  Platelets 11  Date of notification:  04/23/14  Time of notification:  0638  Critical value read back:Yes.    Nurse who received alert:  Edwyna Ready  Notified (1st page):  Schorr

## 2014-04-23 NOTE — Progress Notes (Signed)
Pt transferred to ICU. Report given to [facility]. Meds tubed to ICU.

## 2014-04-23 NOTE — Progress Notes (Signed)
PROGRESS NOTE    [name] OIN:[id number] DOB: 02-25-1933 DOA: 04/19/2014 PCP: [name], MD  Medical Oncology: Dr. Curt Bears Radiation Oncology:  HPI/Brief narrative 81 year old male with history of metastatic non-small cell lung cancer, adenocarcinoma with brain metastasis status post posterior tactic radiotherapy to the brain lesions followed by palliative radiotherapy to the left lower lobe lung mass, currently undergoing systemic maintenance chemotherapy with Alimta-completed 15th cycle on 3/29, COPD, admitted to [facility] on 04/19/14 with complaints of swelling and redness of RLE. Lower extremity venous Dopplers negative for DVT. Admitted for cellulitis.   Assessment/Plan:  Principal Problem:   Cellulitis of legs, R>L - Initially started empirically on IV ceftriaxone for cellulitis of RLE. - Bilateral lower extremity venous Dopplers negative for DVT. - Reassessment on 4/6 showed findings suspicious for cellulitis in both legs without significant improvement in right lower extremity as per patient and family. - Rocephin was discontinued and started IV vancomycin on 4/6. - Monitor clinically. - Patient has 1+ pitting bilateral leg edema, R >L. Rest of cellulitis-like changes did not seem a whole lot better on 4/7 compared to day prior.? Some of his findings related to petechia from thrombocytopenia. - s/p IV Lasix 1 on 4/7 - Blood cultures 2: Negative to date - Lower extremities seem to be improving with decreased erythema, warmth and tenderness but still has significant edema. Unable to provide any more Lasix secondary to hypotension post seizure episode this morning.  Active Problems:   Seizure 1 on 4/8 - Patient had an episode of witnessed seizure (absence like) lasting approximately 45 seconds on 4/8 AM. Aborted spontaneously. - CT head without contrast: No acute findings. We will obtain MRI brain given history of brain metastasis. - Follow EEG -  Loaded with a gram of IV Keppra and continue maintenance Keppra 500 mg every 12 hours. - Monitor for seizures    Hypotension - Patient had hypotension with systolic BPs in the 70J post seizure episode. - Bolused with IV normal saline and placed on brief IV fluids. - Resolved. DC IV fluids and monitor.    CKD (chronic kidney disease), stage 3 - Creatinine at baseline. - Follow BMP.    Antineoplastic chemotherapy induced pancytopenia - Last chemotherapy 3/29. - S/P 1 unit PRBC on 4/6 for hemoglobin of 7 > only improved to 7.2. Transfuse additional 2 units PRBC's and follow CBC. - DC'ed heparin secondary to platelet count of 40 K on 4/6. 23 K on 4/7 - No reported bleeding. Platelet transfusion if active bleeding or platelet count <10 K - Platelets: 11 on 4/8 >as discussed with primary oncologist, will provide a pack of platelets and follow CBC's. Patient had a brief seizure episode soon after starting platelet transfusion-same was stopped and returned to blood bank as per protocol. Awaiting new platelets for transfusion. - WBC: 1.6 & ANC: 1.2 on 4/8 >has discussed with primary oncologist, will provide Granix 2 doses    Pericardial effusion without cardiac tamponade - 2-D echo shows small to moderate free-flowing pericardial effusion - Patient denies chest pain or dyspnea. Did report some dyspnea on admission which is probably multifactorial from lung mass, anemia. - No clinical features suggestive of cardiac tamponade. Monitor closely. - Cardiology consultation appreciated-no intervention needed at this time.    COPD (chronic obstructive pulmonary disease) - Stable      Primary cancer of right lower lobe of lung - Discussed patient's admission and care with his primary oncologist on 4/6    Tachycardia -  Asymptomatic and mild - 2-D echo with normal EF. - Likely secondary to anemia and ongoing infection. - Continue monitoring on telemetry.    Oral thrush - Continue nystatin     Protein-calorie malnutrition, severe - Management per dietitian.    Hypokalemia - We'll replace and follow.     Dyspnea - Multifactorial as stated above - VQ scan: Low probability for PE  - Improved/resolved   Abnormal LFTs - Unclear etiology.  - Patient complained to nursing about decreased appetite and? Emesis.  - We will check RUQ ultrasound and lipase. Abdomen with benign findings.   Code Status: Full Family Communication: Discussed with spouse on 4/8 Disposition Plan: DC home in 3-4 days, when medically stable. Transferred to stepdown unit following seizure on 4/8   Consultants:  Cardiology  Procedures:   2 D Echo: Study Conclusions  - Left ventricle: The cavity size was normal. There was mild concentric hypertrophy. Systolic function was normal. The estimated ejection fraction was in the range of 55% to 60%. Wall motion was normal; there were no regional wall motion abnormalities. There was an increased relative contribution of atrial contraction to ventricular filling. Doppler parameters are consistent with abnormal left ventricular relaxation (grade 1 diastolic dysfunction). - Aortic valve: Moderate diffuse thickening and calcification, consistent with sclerosis. - Aorta: Aortic root dimension: 44 mm (ED). - Aortic root: The aortic root was mildly dilated. - Mitral valve: Calcified annulus. - Pulmonic valve: There was trivial regurgitation. - Pericardium, extracardiac: A small to moderate, free-flowing pericardial effusion was identified along the left ventricular free wall and at the apex. The fluid had no internal echoes.  Antibiotics:  IV Rocephin-DC'd 4/6  IV vancomycin 4/6 >   Subjective: States that his leg swelling and redness are better. Patient had brief approximately 45 seconds seizure this morning.  Objective: Filed Vitals:   04/23/14 0500 04/23/14 0543 04/23/14 1013 04/23/14 1231  BP:  112/62 115/67 123/65  Pulse:   115 98 125  Temp:  97.9 F (36.6 C) 97.9 F (36.6 C)   TempSrc:  Oral Oral   Resp:  18 18   Height:      Weight: 72.8 kg (160 lb 7.9 oz)     SpO2:  99% 99%     Intake/Output Summary (Last 24 hours) at 04/23/14 1506 Last data filed at 04/23/14 1039  Gross per 24 hour  Intake    363 ml  Output   1450 ml  Net  -1087 ml   Filed Weights   04/19/14 2105 04/23/14 0500  Weight: 72.8 kg (160 lb 7.9 oz) 72.8 kg (160 lb 7.9 oz)     Exam:  General exam: Pleasant elderly frail male lying comfortably supine in bed. Respiratory system: Clear. No increased work of breathing. Cardiovascular system: S1 & S2 heard, RRR. No JVD, murmurs, gallops, clicks or pedal edema. Telemetry: Sinus rhythm. Occasional sinus tachycardia in the 110s. Gastrointestinal system: Abdomen is nondistended, soft and nontender. Normal bowel sounds heard. Central nervous system: Alert and oriented. No focal neurological deficits. Extremities: Symmetric 5 x 5 power. Right leg appears mildly asymmetrically swollen compared to left. Both legs are warm to touch below knees. Bilateral patchy erythema extending from lower two thirds of the leg to dorsum of feet (decreasing), right >left. Minimal tenderness. No fluctuation or crepitus. No open wounds. Petechiae++   Data Reviewed: Basic Metabolic Panel:  Recent Labs Lab 04/19/14 1453 04/21/14 0450 04/22/14 0519 04/23/14 0525  NA 137 137 139 138  K 3.7 3.0*  3.1* 3.6  CL 101 104 107 107  CO2 23 24 23 23   GLUCOSE 153* 124* 132* 111*  BUN 36* 31* 30* 29*  CREATININE 1.83* 1.61* 1.64* 1.59*  CALCIUM 8.4 8.0* 8.1* 8.2*   Liver Function Tests:  Recent Labs Lab 04/19/14 1453 04/23/14 0525  AST 54* 329*  ALT 43 209*  ALKPHOS 209* 187*  BILITOT 2.1* 2.8*  PROT 6.1 5.2*  ALBUMIN 2.8* 2.1*    Recent Labs Lab 04/19/14 1453  LIPASE 19   No results for input(s): AMMONIA in the last 168 hours. CBC:  Recent Labs Lab 04/19/14 1453 04/21/14 0450 04/21/14 2200  04/22/14 0500 04/23/14 0525  WBC 9.9 2.4* 1.9* 1.7* 1.6*  NEUTROABS 9.5*  --   --  1.2* 1.2*  HGB 8.7* 7.0* 7.6* 7.2* 8.9*  HCT 25.7* 20.1* 21.6* 21.0* 25.4*  MCV 97.0 96.2 93.5 93.8 90.1  PLT 98* 41* 28* 23* 11*   Cardiac Enzymes:  Recent Labs Lab 04/19/14 1524  TROPONINI <0.03   BNP (last 3 results) No results for input(s): PROBNP in the last 8760 hours. CBG: No results for input(s): GLUCAP in the last 168 hours.  Recent Results (from the past 240 hour(s))  Culture, blood (routine x 2)     Status: None (Preliminary result)   Collection Time: 04/19/14 10:25 PM  Result Value Ref Range Status   Specimen Description BLOOD LEFT ANTECUBITAL  Final   Special Requests BOTTLES DRAWN AEROBIC AND ANAEROBIC 10CC  Final   Culture   Final           BLOOD CULTURE RECEIVED NO GROWTH TO DATE CULTURE WILL BE HELD FOR 5 DAYS BEFORE ISSUING A FINAL NEGATIVE REPORT Note: Culture results may be compromised due to an excessive volume of blood received in culture bottles. Performed at [facility]    Report Status PENDING  Incomplete  Culture, blood (routine x 2)     Status: None (Preliminary result)   Collection Time: 04/19/14 10:30 PM  Result Value Ref Range Status   Specimen Description BLOOD RIGHT ANTECUBITAL  Final   Special Requests BOTTLES DRAWN AEROBIC AND ANAEROBIC 5CC  Final   Culture   Final           BLOOD CULTURE RECEIVED NO GROWTH TO DATE CULTURE WILL BE HELD FOR 5 DAYS BEFORE ISSUING A FINAL NEGATIVE REPORT Performed at [facility]    Report Status PENDING  Incomplete          Studies: Ct Head Wo Contrast  04/23/2014   CLINICAL DATA:  New seizure activity  EXAM: CT HEAD WITHOUT CONTRAST  TECHNIQUE: Contiguous axial images were obtained from the base of the skull through the vertex without intravenous contrast.  COMPARISON:  None.  FINDINGS: Bony calvarium is intact. Mild atrophic changes are noted. Basal ganglia calcifications are seen. No findings to  suggest acute hemorrhage, acute infarction or space-occupying mass lesion are noted.  IMPRESSION: Chronic atrophic changes without acute abnormality.   Electronically Signed   By: Inez Catalina M.D.   On: 04/23/2014 13:11        Scheduled Meds: . sodium chloride   Intravenous Once  . antiseptic oral rinse  7 mL Mouth Rinse q12n4p  . chlorhexidine  15 mL Mouth Rinse BID  . feeding supplement (ENSURE ENLIVE)  237 mL Oral TID BM  . ferrous sulfate  325 mg Oral Q breakfast  . folic acid  932 mcg Oral Daily  . levETIRAcetam  500 mg  Intravenous Q12H  . megestrol  400 mg Oral BID  . nystatin  5 mL Oral QID  . polyvinyl alcohol   Both Eyes Daily  . simvastatin  10 mg Oral QHS  . Tbo-filgastrim (GRANIX) SQ  300 mcg Subcutaneous q1800  . vancomycin  1,250 mg Intravenous Q24H   Continuous Infusions: . sodium chloride 75 mL/hr at 04/23/14 1226    Principal Problem:   Cellulitis of leg, right Active Problems:   CKD (chronic kidney disease)   Antineoplastic chemotherapy induced pancytopenia   Pericardial effusion without cardiac tamponade   COPD (chronic obstructive pulmonary disease)   Primary cancer of right lower lobe of lung   Tachycardia   Oral thrush   Protein-calorie malnutrition, severe   Convulsions/seizures    Time spent: 50 minutes.    Vernell Leep, MD, FACP, FHM. [facility] Pager [phone]  If 7PM-7AM, please contact night-coverage [email or web address] Password [email or web address] 04/23/2014, 3:06 PM    LOS: 4 days

## 2014-04-23 NOTE — Progress Notes (Signed)
Patient is complaining of itching. Spoke with MD and order was received for 25mg  of Benadryl.

## 2014-04-23 NOTE — Progress Notes (Signed)
EEG completed; results pending.    

## 2014-04-24 ENCOUNTER — Inpatient Hospital Stay (HOSPITAL_COMMUNITY): Payer: Medicare Other

## 2014-04-24 DIAGNOSIS — D61810 Antineoplastic chemotherapy induced pancytopenia: Secondary | ICD-10-CM

## 2014-04-24 LAB — PREPARE PLATELET PHERESIS
UNIT DIVISION: 0
Unit division: 0

## 2014-04-24 LAB — COMPREHENSIVE METABOLIC PANEL
ALBUMIN: 2.1 g/dL — AB (ref 3.5–5.2)
ALK PHOS: 180 U/L — AB (ref 39–117)
ALT: 225 U/L — ABNORMAL HIGH (ref 0–53)
ANION GAP: 8 (ref 5–15)
AST: 251 U/L — AB (ref 0–37)
BUN: 32 mg/dL — AB (ref 6–23)
CO2: 22 mmol/L (ref 19–32)
Calcium: 8.1 mg/dL — ABNORMAL LOW (ref 8.4–10.5)
Chloride: 104 mmol/L (ref 96–112)
Creatinine, Ser: 1.52 mg/dL — ABNORMAL HIGH (ref 0.50–1.35)
GFR calc Af Amer: 49 mL/min — ABNORMAL LOW (ref >90)
GFR, EST NON AFRICAN AMERICAN: 42 mL/min — AB (ref >90)
Glucose, Bld: 120 mg/dL — ABNORMAL HIGH (ref 70–99)
POTASSIUM: 3.2 mmol/L — AB (ref 3.5–5.1)
SODIUM: 134 mmol/L — AB (ref 135–145)
TOTAL PROTEIN: 5.2 g/dL — AB (ref 6.0–8.3)
Total Bilirubin: 1.7 mg/dL — ABNORMAL HIGH (ref 0.3–1.2)

## 2014-04-24 LAB — CBC
HCT: 24.9 % — ABNORMAL LOW (ref 39.0–52.0)
Hemoglobin: 8.7 g/dL — ABNORMAL LOW (ref 13.0–17.0)
MCH: 31.4 pg (ref 26.0–34.0)
MCHC: 34.9 g/dL (ref 30.0–36.0)
MCV: 89.9 fL (ref 78.0–100.0)
PLATELETS: 17 10*3/uL — AB (ref 150–400)
RBC: 2.77 MIL/uL — AB (ref 4.22–5.81)
RDW: 17.8 % — ABNORMAL HIGH (ref 11.5–15.5)
WBC: 1.5 10*3/uL — ABNORMAL LOW (ref 4.0–10.5)

## 2014-04-24 MED ORDER — POTASSIUM CHLORIDE CRYS ER 20 MEQ PO TBCR
40.0000 meq | EXTENDED_RELEASE_TABLET | Freq: Once | ORAL | Status: AC
  Administered 2014-04-24: 40 meq via ORAL
  Filled 2014-04-24: qty 2

## 2014-04-24 MED ORDER — DOXYCYCLINE HYCLATE 100 MG PO TABS
100.0000 mg | ORAL_TABLET | Freq: Two times a day (BID) | ORAL | Status: AC
  Administered 2014-04-24 – 2014-04-28 (×10): 100 mg via ORAL
  Filled 2014-04-24 (×12): qty 1

## 2014-04-24 MED ORDER — FERROUS SULFATE 325 (65 FE) MG PO TABS
325.0000 mg | ORAL_TABLET | Freq: Every day | ORAL | Status: DC
  Administered 2014-04-26: 325 mg via ORAL
  Filled 2014-04-24 (×8): qty 1

## 2014-04-24 NOTE — Progress Notes (Addendum)
PROGRESS NOTE    [name] KPT:[id number] DOB: [birth year] DOA: 04/19/2014 PCP: [name], MD  Medical Oncology: Dr. Curt Bears Radiation Oncology:  HPI/Brief narrative 81 year old male with history of metastatic non-small cell lung cancer, adenocarcinoma with brain metastasis status post posterior tactic radiotherapy to the brain lesions followed by palliative radiotherapy to the left lower lobe lung mass, currently undergoing systemic maintenance chemotherapy with Alimta-completed 15th cycle on 3/29, COPD, admitted to [facility] on 04/19/14 with complaints of swelling and redness of RLE. Lower extremity venous Dopplers negative for DVT. Admitted for cellulitis. Hospital course complicated by chemotherapy related severe pancytopenia requiring transfusions of PRBC and platelets and Granix shots and new onset seizure 1 for which she was transferred to stepdown unit on 4/8.   Assessment/Plan:  Principal Problem:   Cellulitis of legs, R>L - Initially started empirically on IV ceftriaxone for cellulitis of RLE. - Bilateral lower extremity venous Dopplers negative for DVT. - Reassessment on 4/6 showed findings suspicious for cellulitis in both legs without significant improvement in right lower extremity as per patient and family. - Rocephin was discontinued and started IV vancomycin on 4/6. - Monitor clinically. - Patient has 1+ pitting bilateral leg edema, R >L. Rest of cellulitis-like changes did not seem a whole lot better on 4/7 compared to day prior.? Some of his findings related to petechia from thrombocytopenia. - s/p IV Lasix 1 on 4/7 - Blood cultures 2: Negative to date - Lower extremities seem to be improving with decreased erythema, warmth and tenderness but still has significant edema. His lower extremity findings no suggestive more of petechia related to thrombocytopenia.  -  DC vancomycin and start oral doxycycline.   Active Problems:   Seizure 1 on  4/8 - Patient had an episode of witnessed seizure (absence like) lasting approximately 45 seconds on 4/8 AM. Aborted spontaneously. - CT head without contrast: No acute findings. We will obtain MRI brain given history of brain metastasis-Pending. - EEG-Report pending  - Loaded with a gram of IV Keppra and continue maintenance Keppra 500 mg every 12 hours. - Monitor for seizures-No further seizures - Patient counseled extensively that he should not drive or operate heavy machinery or be at heights or in standing water for at least 6 months and medical clearance from M.D. He verbalized understanding     Hypotension - Patient had  transient hypotension with systolic BPs in the 17G post seizure episode on 4/8. - resolved after IV fluids    CKD (chronic kidney disease), stage 3 - Creatinine at baseline. - Follow BMP.    Antineoplastic chemotherapy induced pancytopenia - Last chemotherapy 3/29. - S/P 1 unit PRBC on 4/6 for hemoglobin of 7 > only improved to 7.2. Transfuse additional 2 units PRBC's and follow CBC. - DC'ed heparin secondary to platelet count of 40 K on 4/6. 23 K on 4/7 - No reported bleeding. Platelet transfusion if active bleeding or platelet count <10 K - Platelets: 11 on 4/8 >as discussed with primary oncologist, will provide a pack of platelets and follow CBC's. Patient had a brief seizure episode soon after starting platelet transfusion-same was stopped and returned to blood bank as per protocol. Awaiting new platelets for transfusion. - WBC: 1.6 & ANC: 1.2 on 4/8 >has discussed with primary oncologist, will provide Granix 2 doses - WBC: 1.5 and platelets 17 K. Will get second dose of Granix today. No platelet transfusion for today. Follow CBC in a.m. - Patient had minimal left nose epistaxis  yesterday-resolved. Monitor    Pericardial effusion without cardiac tamponade - 2-D echo shows small to moderate free-flowing pericardial effusion - Patient denies chest pain or  dyspnea. Did report some dyspnea on admission which is probably multifactorial from lung mass, anemia. - No clinical features suggestive of cardiac tamponade. Monitor closely. - Cardiology consultation appreciated-no intervention needed at this time.    COPD (chronic obstructive pulmonary disease) - Stable      Primary cancer of right lower lobe of lung - Discussed patient's admission and care with his primary oncologist on 4/9    Tachycardia - Asymptomatic and mild - 2-D echo with normal EF. - Likely secondary to anemia and ongoing infection. - Continue monitoring on telemetry.Stable    Oral thrush - Continue nystatin    Protein-calorie malnutrition, severe - Management per dietitian.    Hypokalemia - We'll replace and follow.     Dyspnea - Multifactorial as stated above - VQ scan: Low probability for PE  - Improved/resolved   Abnormal LFTs - Unclear etiology.  - Patient today stated that he never had any GI symptoms.  - RUQ ultrasound: No acute findings to explain elevated LFTs. - Lipase: 18 - As discussed with oncologist,? Related to Alimta. Improving - Temporarily hold statins  Code Status: Full Family Communication: Discussed with spouse on 4/8 Disposition Plan: DC home in 2-3 days, when medically stable. Transfer to medical bed.   Consultants:  Cardiology  Procedures:   2 D Echo: Study Conclusions  - Left ventricle: The cavity size was normal. There was mild concentric hypertrophy. Systolic function was normal. The estimated ejection fraction was in the range of 55% to 60%. Wall motion was normal; there were no regional wall motion abnormalities. There was an increased relative contribution of atrial contraction to ventricular filling. Doppler parameters are consistent with abnormal left ventricular relaxation (grade 1 diastolic dysfunction). - Aortic valve: Moderate diffuse thickening and calcification, consistent with sclerosis. -  Aorta: Aortic root dimension: 44 mm (ED). - Aortic root: The aortic root was mildly dilated. - Mitral valve: Calcified annulus. - Pulmonic valve: There was trivial regurgitation. - Pericardium, extracardiac: A small to moderate, free-flowing pericardial effusion was identified along the left ventricular free wall and at the apex. The fluid had no internal echoes.  Antibiotics:  IV Rocephin-DC'd 4/6  IV vancomycin 4/6 > 4/9    oral doxycycline 4/9 >  Subjective: Denies any further seizures. States that legs continue to improve with decreased swelling and redness. As per nursing, no acute events.   Objective: Filed Vitals:   04/24/14 0600 04/24/14 0700 04/24/14 0800 04/24/14 0900  BP: 118/65 94/55 98/52  99/57  Pulse: 127 108 111 105  Temp:      TempSrc:      Resp: 22 23 16 25   Height:      Weight:      SpO2: 97% 100% 100% 100%    Intake/Output Summary (Last 24 hours) at 04/24/14 1041 Last data filed at 04/24/14 0917  Gross per 24 hour  Intake    850 ml  Output   1400 ml  Net   -550 ml   Filed Weights   04/19/14 2105 04/23/14 0500 04/24/14 0500  Weight: 72.8 kg (160 lb 7.9 oz) 72.8 kg (160 lb 7.9 oz) 68.5 kg (151 lb 0.2 oz)     Exam:  General exam: Pleasant elderly frail male lying comfortably supine in bed. Respiratory system: Clear. No increased work of breathing. Cardiovascular system: S1 & S2 heard, RRR.  No JVD, murmurs, gallops, clicks or pedal edema. Telemetry: SR 90's-ST 100's. Gastrointestinal system: Abdomen is nondistended, soft and nontender. Normal bowel sounds heard. Central nervous system: Alert and oriented. No focal neurological deficits. Extremities: Symmetric 5 x 5 power. Right leg appears mildly asymmetrically swollen compared to left. Bilateral patchy erythema extending from lower two thirds of the leg to dorsum of feet (continues to decrease) right >left.  No tenderness. No fluctuation or crepitus. No open wounds. Petechiae++   Data  Reviewed: Basic Metabolic Panel:  Recent Labs Lab 04/19/14 1453 04/21/14 0450 04/22/14 0519 04/23/14 0525 04/24/14 0525  NA 137 137 139 138 134*  K 3.7 3.0* 3.1* 3.6 3.2*  CL 101 104 107 107 104  CO2 23 24 23 23 22   GLUCOSE 153* 124* 132* 111* 120*  BUN 36* 31* 30* 29* 32*  CREATININE 1.83* 1.61* 1.64* 1.59* 1.52*  CALCIUM 8.4 8.0* 8.1* 8.2* 8.1*   Liver Function Tests:  Recent Labs Lab 04/19/14 1453 04/23/14 0525 04/24/14 0525  AST 54* 329* 251*  ALT 43 209* 225*  ALKPHOS 209* 187* 180*  BILITOT 2.1* 2.8* 1.7*  PROT 6.1 5.2* 5.2*  ALBUMIN 2.8* 2.1* 2.1*    Recent Labs Lab 04/19/14 1453 04/23/14 0525  LIPASE 19 18   No results for input(s): AMMONIA in the last 168 hours. CBC:  Recent Labs Lab 04/19/14 1453  04/21/14 2200 04/22/14 0500 04/23/14 0525 04/23/14 2315 04/24/14 0525  WBC 9.9  < > 1.9* 1.7* 1.6* 1.2* 1.5*  NEUTROABS 9.5*  --   --  1.2* 1.2*  --   --   HGB 8.7*  < > 7.6* 7.2* 8.9* 10.4* 8.7*  HCT 25.7*  < > 21.6* 21.0* 25.4* 30.0* 24.9*  MCV 97.0  < > 93.5 93.8 90.1 89.0 89.9  PLT 98*  < > 28* 23* 11* 19* 17*  < > = values in this interval not displayed. Cardiac Enzymes:  Recent Labs Lab 04/19/14 1524  TROPONINI <0.03   BNP (last 3 results) No results for input(s): PROBNP in the last 8760 hours. CBG: No results for input(s): GLUCAP in the last 168 hours.  Recent Results (from the past 240 hour(s))  Culture, blood (routine x 2)     Status: None (Preliminary result)   Collection Time: 04/19/14 10:25 PM  Result Value Ref Range Status   Specimen Description BLOOD LEFT ANTECUBITAL  Final   Special Requests BOTTLES DRAWN AEROBIC AND ANAEROBIC 10CC  Final   Culture   Final           BLOOD CULTURE RECEIVED NO GROWTH TO DATE CULTURE WILL BE HELD FOR 5 DAYS BEFORE ISSUING A FINAL NEGATIVE REPORT Note: Culture results may be compromised due to an excessive volume of blood received in culture bottles. Performed at [facility]     Report Status PENDING  Incomplete  Culture, blood (routine x 2)     Status: None (Preliminary result)   Collection Time: 04/19/14 10:30 PM  Result Value Ref Range Status   Specimen Description BLOOD RIGHT ANTECUBITAL  Final   Special Requests BOTTLES DRAWN AEROBIC AND ANAEROBIC 5CC  Final   Culture   Final           BLOOD CULTURE RECEIVED NO GROWTH TO DATE CULTURE WILL BE HELD FOR 5 DAYS BEFORE ISSUING A FINAL NEGATIVE REPORT Performed at [facility]    Report Status PENDING  Incomplete  MRSA PCR Screening     Status: None   Collection  Time: 04/23/14  1:15 PM  Result Value Ref Range Status   MRSA by PCR NEGATIVE NEGATIVE Final    Comment:        The GeneXpert MRSA Assay (FDA approved for NASAL specimens only), is one component of a comprehensive MRSA colonization surveillance program. It is not intended to diagnose MRSA infection nor to guide or monitor treatment for MRSA infections.           Studies: Ct Head Wo Contrast  04/23/2014   CLINICAL DATA:  New seizure activity  EXAM: CT HEAD WITHOUT CONTRAST  TECHNIQUE: Contiguous axial images were obtained from the base of the skull through the vertex without intravenous contrast.  COMPARISON:  None.  FINDINGS: Bony calvarium is intact. Mild atrophic changes are noted. Basal ganglia calcifications are seen. No findings to suggest acute hemorrhage, acute infarction or space-occupying mass lesion are noted.  IMPRESSION: Chronic atrophic changes without acute abnormality.   Electronically Signed   By: Inez Catalina M.D.   On: 04/23/2014 13:11   US Abdomen Limited Ruq  04/23/2014   CLINICAL DATA:  Abnormal LFTs.  EXAM: US ABDOMEN LIMITED - RIGHT UPPER QUADRANT  COMPARISON:  None.  FINDINGS: Gallbladder:  Previous cholecystectomy.  Common bile duct:  Diameter: 2 mm  Liver:  No focal lesion identified. Within normal limits in parenchymal echogenicity.  Other:  Right pleural effusion noted.  IMPRESSION: 1. No findings to explain  patient's elevated LFTs. 2. Prior cholecystectomy 3. Right pleural effusion   Electronically Signed   By: Kerby Moors M.D.   On: 04/23/2014 16:29        Scheduled Meds: . sodium chloride   Intravenous Once  . sodium chloride   Intravenous Once  . antiseptic oral rinse  7 mL Mouth Rinse q12n4p  . chlorhexidine  15 mL Mouth Rinse BID  . feeding supplement (ENSURE ENLIVE)  237 mL Oral TID BM  . ferrous sulfate  325 mg Oral Q breakfast  . folic acid  245 mcg Oral Daily  . levETIRAcetam  500 mg Intravenous Q12H  . megestrol  400 mg Oral BID  . nystatin  5 mL Oral QID  . polyvinyl alcohol   Both Eyes Daily  . simvastatin  10 mg Oral QHS  . Tbo-filgastrim (GRANIX) SQ  300 mcg Subcutaneous q1800  . vancomycin  1,250 mg Intravenous Q24H   Continuous Infusions:    Principal Problem:   Cellulitis of leg, right Active Problems:   CKD (chronic kidney disease)   Antineoplastic chemotherapy induced pancytopenia   Pericardial effusion without cardiac tamponade   COPD (chronic obstructive pulmonary disease)   Primary cancer of right lower lobe of lung   Tachycardia   Oral thrush   Protein-calorie malnutrition, severe   Convulsions/seizures    Time spent: 30 minutes.    Vernell Leep, MD, FACP, FHM. [facility] Pager [phone]  If 7PM-7AM, please contact night-coverage [email or web address] Password [email or web address] 04/24/2014, 10:41 AM    LOS: 5 days

## 2014-04-24 NOTE — Progress Notes (Signed)
CRITICAL VALUE ALERT  Critical value received:  WBC 1.2 and Platelets 19k  Date of notification: 04/24/14  Time of notification: 0025  Critical value read back:Yes.    Nurse who received alert:  Tvedt  MD notified (1st page):  Fredirick Maudlin ([place])  Time of first page:  0040  MD notified (2nd page):  Time of second page:  Responding MD:  Fredirick Maudlin  Time MD responded:  [phone]

## 2014-04-24 NOTE — Progress Notes (Signed)
DIAGNOSIS: Stage IV ( T2b, N2, M1b) non-small cell lung cancer, adenocarcinoma with areas of squamous differentiation with negative EGFR mutation and negative ALK gene translocation, presented with left lower lobe lung mass in addition to mediastinal lymphadenopathy and brain metastases diagnosed in October of 2014  PRIOR THERAPY: 1) Stereotactic radiotherapy to 3 brain lesions under the care of Dr. Lisbeth Renshaw completed 12/08/2012. 2) Palliative radiotherapy to the left lower lobe lung mass under the care of Dr. Lisbeth Renshaw expected to be completed 01/01/2013. 3) Systemic chemotherapy with carboplatin for AUC of 5 and Alimta 500 mg/M2 every 3 weeks. First dose 12/23/2012. Status post 6 cycles  CURRENT THERAPY: Maintenance chemotherapy with single agent Alimta at 500 mg per meter squared given every 3 weeks. First cycle expected to be given 05/12/2013. Status post 14 cycles. Starting with cycle #15, Alimta was reduced to 400 mg/M2   CHEMOTHERAPY INTENT: Palliative  CURRENT # OF CHEMOTHERAPY CYCLES: 15  CURRENT ANTIEMETICS:Zofran, dexamethasone and Compazine  CURRENT SMOKING STATUS: former smoker  ORAL CHEMOTHERAPY AND CONSENT: None  CURRENT BISPHOSPHONATES USE: None  PAIN MANAGEMENT: 0/10  NARCOTICS INDUCED CONSTIPATION: None  LIVING WILL AND CODE STATUS: ?  Subjective: The patient is seen and examined today. He is feeling much better. He had an episode of absence seizure yesterday but recovered quickly. CT scan of the head showed no evidence for metastatic disease to the brain. The patient was seen by neurology and started on Keppra. He is also scheduled for MRI of the brain. He denied having any significant fever or chills. He is admitted for treatment of cellulitis of the lower extremity and the patient has evidence for pancytopenia secondary to his most recent systemic chemotherapy with reduced dose Alimta.  Objective: Vital signs in last 24 hours: Temp:  [97.4 F (36.3 C)-99.1 F (37.3  C)] 99.1 F (37.3 C) (04/09 0300) Pulse Rate:  [98-135] 127 (04/09 0600) Resp:  [12-30] 22 (04/09 0600) BP: (90-135)/(42-94) 118/65 mmHg (04/09 0600) SpO2:  [97 %-100 %] 97 % (04/09 0600) Weight:  [151 lb 0.2 oz (68.5 kg)] 151 lb 0.2 oz (68.5 kg) (04/09 0500)  Intake/Output from previous day: 04/08 0701 - 04/09 0700 In: 773 [I.V.:250; Blood:418; IV Piggyback:105] Out: 1525 [Urine:1525] Intake/Output this shift:    General appearance: alert, cooperative, fatigued and no distress Resp: clear to auscultation bilaterally Cardio: regular rate and rhythm, S1, S2 normal, no murmur, click, rub or gallop GI: soft, non-tender; bowel sounds normal; no masses,  no organomegaly Extremities: edema 1+  Lab Results:   Recent Labs  04/23/14 2315 04/24/14 0525  WBC 1.2* 1.5*  HGB 10.4* 8.7*  HCT 30.0* 24.9*  PLT 19* 17*   BMET  Recent Labs  04/23/14 0525 04/24/14 0525  NA 138 134*  K 3.6 3.2*  CL 107 104  CO2 23 22  GLUCOSE 111* 120*  BUN 29* 32*  CREATININE 1.59* 1.52*  CALCIUM 8.2* 8.1*    Studies/Results: Ct Head Wo Contrast  04/23/2014   CLINICAL DATA:  New seizure activity  EXAM: CT HEAD WITHOUT CONTRAST  TECHNIQUE: Contiguous axial images were obtained from the base of the skull through the vertex without intravenous contrast.  COMPARISON:  None.  FINDINGS: Bony calvarium is intact. Mild atrophic changes are noted. Basal ganglia calcifications are seen. No findings to suggest acute hemorrhage, acute infarction or space-occupying mass lesion are noted.  IMPRESSION: Chronic atrophic changes without acute abnormality.   Electronically Signed   By: Inez Catalina M.D.   On: 04/23/2014 13:11  US Abdomen Limited Ruq  04/23/2014   CLINICAL DATA:  Abnormal LFTs.  EXAM: US ABDOMEN LIMITED - RIGHT UPPER QUADRANT  COMPARISON:  None.  FINDINGS: Gallbladder:  Previous cholecystectomy.  Common bile duct:  Diameter: 2 mm  Liver:  No focal lesion identified. Within normal limits in parenchymal  echogenicity.  Other:  Right pleural effusion noted.  IMPRESSION: 1. No findings to explain patient's elevated LFTs. 2. Prior cholecystectomy 3. Right pleural effusion   Electronically Signed   By: Kerby Moors M.D.   On: 04/23/2014 16:29    Medications: I have reviewed the patient's current medications.   Assessment/Plan: 1) metastatic non-small cell lung cancer, adenocarcinoma status post induction chemotherapy with carboplatin and Alimta followed by maintenance Alimta status post 15 cycles. He has been tolerating his treatment fairly well but recently had significant pancytopenia after his chemotherapy even with reduced dose of Alimta. I recommended for the patient to discontinue his chemotherapy with Alimta at this point. I will discuss with him other options after discharge including close monitoring and observation versus consideration of treatment with immunotherapy. 2) chemotherapy-induced pancytopenia: He is currently on With mild improvement in his white blood count. We will continue to monitor his platelets count and hemoglobin and hematocrit closely. We will consider the patient for PRBCs transfusion if his hemoglobin is less than 8 g/dL and for platelet transfusion if his platelets count are less than 10,000 or if the patient has any bleeding issues. 3) cellulitis of the lower extremity: Continue treatment with doxycycline. 4) questionable seizure: The patient will continue on Keppra. He is followed by neurology. Thank you for taking good care of Mr. Incorvaia. I will continue to follow up the patient with you and assist in his management on as-needed basis.    LOS: 5 days    Cortina Vultaggio K. 04/24/2014

## 2014-04-25 LAB — CBC WITH DIFFERENTIAL/PLATELET
BASOS ABS: 0 10*3/uL (ref 0.0–0.1)
Basophils Absolute: 0 10*3/uL (ref 0.0–0.1)
Basophils Relative: 1 % (ref 0–1)
Basophils Relative: 1 % (ref 0–1)
EOS ABS: 0.2 10*3/uL (ref 0.0–0.7)
EOS PCT: 11 % — AB (ref 0–5)
EOS PCT: 10 % — AB (ref 0–5)
Eosinophils Absolute: 0.1 10*3/uL (ref 0.0–0.7)
HCT: 21.8 % — ABNORMAL LOW (ref 39.0–52.0)
HEMATOCRIT: 23.6 % — AB (ref 39.0–52.0)
HEMOGLOBIN: 7.5 g/dL — AB (ref 13.0–17.0)
Hemoglobin: 8 g/dL — ABNORMAL LOW (ref 13.0–17.0)
LYMPHS ABS: 0.3 10*3/uL — AB (ref 0.7–4.0)
Lymphocytes Relative: 18 % (ref 12–46)
Lymphocytes Relative: 13 % (ref 12–46)
Lymphs Abs: 0.2 10*3/uL — ABNORMAL LOW (ref 0.7–4.0)
MCH: 30.7 pg (ref 26.0–34.0)
MCH: 31.4 pg (ref 26.0–34.0)
MCHC: 33.9 g/dL (ref 30.0–36.0)
MCHC: 34.4 g/dL (ref 30.0–36.0)
MCV: 90.4 fL (ref 78.0–100.0)
MCV: 91.2 fL (ref 78.0–100.0)
MONO ABS: 0 10*3/uL — AB (ref 0.1–1.0)
Monocytes Absolute: 0 10*3/uL — ABNORMAL LOW (ref 0.1–1.0)
Monocytes Relative: 1 % — ABNORMAL LOW (ref 3–12)
Monocytes Relative: 2 % — ABNORMAL LOW (ref 3–12)
NEUTROS ABS: 1 10*3/uL — AB (ref 1.7–7.7)
NEUTROS ABS: 1 10*3/uL — AB (ref 1.7–7.7)
NEUTROS PCT: 69 % (ref 43–77)
NEUTROS PCT: 74 % (ref 43–77)
Platelets: 7 10*3/uL — CL (ref 150–400)
Platelets: 38 10*3/uL — ABNORMAL LOW (ref 150–400)
RBC: 2.61 MIL/uL — ABNORMAL LOW (ref 4.22–5.81)
RBC: 2.39 MIL/uL — ABNORMAL LOW (ref 4.22–5.81)
RDW: 17.6 % — ABNORMAL HIGH (ref 11.5–15.5)
RDW: 17.4 % — ABNORMAL HIGH (ref 11.5–15.5)
WBC: 1.5 10*3/uL — AB (ref 4.0–10.5)
WBC: 1.5 10*3/uL — AB (ref 4.0–10.5)

## 2014-04-25 LAB — COMPREHENSIVE METABOLIC PANEL
ALT: 240 U/L — ABNORMAL HIGH (ref 0–53)
ANION GAP: 10 (ref 5–15)
AST: 249 U/L — ABNORMAL HIGH (ref 0–37)
Albumin: 2.1 g/dL — ABNORMAL LOW (ref 3.5–5.2)
Alkaline Phosphatase: 202 U/L — ABNORMAL HIGH (ref 39–117)
BUN: 31 mg/dL — AB (ref 6–23)
CO2: 23 mmol/L (ref 19–32)
Calcium: 8.4 mg/dL (ref 8.4–10.5)
Chloride: 106 mmol/L (ref 96–112)
Creatinine, Ser: 1.56 mg/dL — ABNORMAL HIGH (ref 0.50–1.35)
GFR calc Af Amer: 47 mL/min — ABNORMAL LOW (ref >90)
GFR, EST NON AFRICAN AMERICAN: 41 mL/min — AB (ref >90)
Glucose, Bld: 114 mg/dL — ABNORMAL HIGH (ref 70–99)
POTASSIUM: 3.7 mmol/L (ref 3.5–5.1)
Sodium: 139 mmol/L (ref 135–145)
Total Bilirubin: 2.4 mg/dL — ABNORMAL HIGH (ref 0.3–1.2)
Total Protein: 5.3 g/dL — ABNORMAL LOW (ref 6.0–8.3)

## 2014-04-25 MED ORDER — LEVETIRACETAM 500 MG PO TABS
500.0000 mg | ORAL_TABLET | Freq: Two times a day (BID) | ORAL | Status: DC
  Administered 2014-04-25 – 2014-05-02 (×14): 500 mg via ORAL
  Filled 2014-04-25 (×15): qty 1

## 2014-04-25 MED ORDER — CALCIUM CARBONATE ANTACID 500 MG PO CHEW
1.0000 | CHEWABLE_TABLET | Freq: Two times a day (BID) | ORAL | Status: DC | PRN
  Administered 2014-04-25 – 2014-04-27 (×2): 200 mg via ORAL
  Filled 2014-04-25 (×3): qty 1

## 2014-04-25 MED ORDER — SODIUM CHLORIDE 0.9 % IV SOLN: Freq: Once | INTRAVENOUS | Status: DC

## 2014-04-25 MED ORDER — FUROSEMIDE 20 MG PO TABS
20.0000 mg | ORAL_TABLET | Freq: Every day | ORAL | Status: DC
  Administered 2014-04-25 – 2014-04-28 (×4): 20 mg via ORAL
  Filled 2014-04-25 (×4): qty 1

## 2014-04-25 MED ORDER — DEXAMETHASONE 4 MG PO TABS
4.0000 mg | ORAL_TABLET | Freq: Two times a day (BID) | ORAL | Status: DC
  Administered 2014-04-25 – 2014-05-02 (×15): 4 mg via ORAL
  Filled 2014-04-25 (×16): qty 1

## 2014-04-25 MED ORDER — TBO-FILGRASTIM 300 MCG/0.5ML ~~LOC~~ SOSY
300.0000 ug | PREFILLED_SYRINGE | Freq: Every day | SUBCUTANEOUS | Status: AC
  Administered 2014-04-25 – 2014-04-26 (×2): 300 ug via SUBCUTANEOUS
  Filled 2014-04-25 (×3): qty 0.5

## 2014-04-25 NOTE — Progress Notes (Signed)
Addendum   - labs reviewed. Platelets improved from 7 to 38K. - Hemoglobin has dropped from 8 > 7.5 g per DL. Follow CBC in a.m. and transfuse if drops any further.  Vernell Leep, MD, FACP, FHM. [facility] Pager [phone]  If 7PM-7AM, please contact night-coverage [email or web address] Password [email or web address] 04/25/2014, 4:30 PM

## 2014-04-25 NOTE — Progress Notes (Signed)
PROGRESS NOTE    [name] EQA:[id number] DOB: 05/30/1933 DOA: 04/19/2014 PCP: [name], MD  Medical Oncology: Dr. Curt Bears Radiation Oncology:  HPI/Brief narrative 81 year old male with history of metastatic non-small cell lung cancer, adenocarcinoma with brain metastasis status post posterior tactic radiotherapy to the brain lesions followed by palliative radiotherapy to the left lower lobe lung mass, currently undergoing systemic maintenance chemotherapy with Alimta-completed 15th cycle on 3/29, COPD, admitted to [facility] on 04/19/14 with complaints of swelling and redness of RLE. Lower extremity venous Dopplers negative for DVT. Admitted for cellulitis. Hospital course complicated by chemotherapy related severe pancytopenia requiring transfusions of PRBC and platelets and Granix shots and new onset seizure 1 for which she was transferred to stepdown unit on 4/8.   Assessment/Plan:  Principal Problem:   Cellulitis of legs, R>L - Initially started empirically on IV ceftriaxone for cellulitis of RLE. - Bilateral lower extremity venous Dopplers negative for DVT. - Reassessment on 4/6 showed findings suspicious for cellulitis in both legs without significant improvement in right lower extremity as per patient and family. - Rocephin was discontinued and started IV vancomycin on 4/6. - Monitor clinically. - Patient has 1+ pitting bilateral leg edema, R >L. Rest of cellulitis-like changes did not seem a whole lot better on 4/7 compared to day prior.? Some of his findings related to petechia from thrombocytopenia. - s/p IV Lasix 1 on 4/7 - Blood cultures 2: Negative to date - Lower extremities seem to be improving with decreased erythema, warmth and tenderness but still has significant edema. His lower extremity findings now suggestive more of petechia related to thrombocytopenia. Will start PO Lasix for leg edema. -  DC vancomycin and started oral doxycycline 4/9.     Active Problems:   Seizure 1 on 4/8 - Patient had an episode of witnessed seizure (absence like) lasting approximately 45 seconds on 4/8 AM. Aborted spontaneously. - CT head without contrast: No acute findings.  - MRI brain results as below. Performed without contrast secondary to renal insufficiency. Discussed results with Dr. Julien Nordmann who recommended starting dexamethasone. - EEG-Report pending  - Loaded with a gram of IV Keppra and continue maintenance Keppra 500 mg every 12 hours. - Monitor for seizures-No further seizures - Patient counseled extensively that he should not drive or operate heavy machinery or be at heights or in standing water for at least 6 months and medical clearance from M.D. He verbalized understanding     Hypotension - Patient had  transient hypotension with systolic BPs in the 97L post seizure episode on 4/8. - resolved after IV fluids    CKD (chronic kidney disease), stage 3 - Creatinine at baseline. - Follow BMP periodically.    Antineoplastic chemotherapy induced pancytopenia - Last chemotherapy 3/29. - S/P 1 unit PRBC on 4/6 for hemoglobin of 7 > only improved to 7.2. Transfuse additional 2 units PRBC's and follow CBC. - DC'ed heparin secondary to platelet count of 40 K on 4/6. 23 K on 4/7 - No reported bleeding. Platelet transfusion if active bleeding or platelet count <10 K - Platelets: 11 on 4/8 >as discussed with primary oncologist, will provide a pack of platelets and follow CBC's. Patient had a brief seizure episode soon after starting platelet transfusion-same was stopped and returned to blood bank as per protocol. Awaiting new platelets for transfusion. - Despite 2 days of Granix, patient WBC count 1.5 and ANC 1. We will continue Granix for additional 2 days. - Platelet count had improved  post transfusion but is again dropped to [place]. We'll transfuse 1 unit of platelets. Follow CBCs - Brief mild left nostril epistaxis-resolved spontaneously.     Pericardial effusion without cardiac tamponade - 2-D echo shows small to moderate free-flowing pericardial effusion - Patient denies chest pain or dyspnea. Did report some dyspnea on admission which is probably multifactorial from lung mass, anemia. - No clinical features suggestive of cardiac tamponade. Monitor closely. - Cardiology consultation appreciated-no intervention needed at this time.    COPD (chronic obstructive pulmonary disease) - Stable      Primary cancer of right lower lobe of lung - Oncology input appreciated.    Tachycardia - Asymptomatic and mild - 2-D echo with normal EF. - Likely secondary to anemia and ongoing infection. - Stable and telemetry was discontinued.    Oral thrush - Continue nystatin    Protein-calorie malnutrition, severe - Management per dietitian.    Hypokalemia - Replaced     Dyspnea - Multifactorial as stated above - VQ scan: Low probability for PE  - Improved/resolved   Abnormal LFTs - Unclear etiology.  - Patient today stated that he never had any GI symptoms.  - RUQ ultrasound: No acute findings to explain elevated LFTs. - Lipase: 18 - As discussed with oncologist,? Related to Alimta. Improving - Temporarily hold statins  Code Status: Full Family Communication: Discussed with spouse on 4/8 Disposition Plan: DC home in 2-3 days, when medically stable. Transferred to medical bed.   Consultants:  Cardiology  Medical oncology  Procedures:   2 D Echo: Study Conclusions  - Left ventricle: The cavity size was normal. There was mild concentric hypertrophy. Systolic function was normal. The estimated ejection fraction was in the range of 55% to 60%. Wall motion was normal; there were no regional wall motion abnormalities. There was an increased relative contribution of atrial contraction to ventricular filling. Doppler parameters are consistent with abnormal left ventricular relaxation (grade 1 diastolic  dysfunction). - Aortic valve: Moderate diffuse thickening and calcification, consistent with sclerosis. - Aorta: Aortic root dimension: 44 mm (ED). - Aortic root: The aortic root was mildly dilated. - Mitral valve: Calcified annulus. - Pulmonic valve: There was trivial regurgitation. - Pericardium, extracardiac: A small to moderate, free-flowing pericardial effusion was identified along the left ventricular free wall and at the apex. The fluid had no internal echoes.  Antibiotics:  IV Rocephin-DC'd 4/6  IV vancomycin 4/6 > 4/9   PO doxycycline 4/9 >  Subjective: Minimal transient left epistaxis-resolved spontaneously. Denies any other complaints.  Objective: Filed Vitals:   04/25/14 0834 04/25/14 0900 04/25/14 1112 04/25/14 1300  BP: 101/59 102/62 116/67   Pulse: 94 114 106   Temp: 98 F (36.7 C) 97.8 F (36.6 C) 97.3 F (36.3 C)   TempSrc: Oral Oral Axillary   Resp: 24 24 24    Height:      Weight:    78.2 kg (172 lb 6.4 oz)  SpO2: 100% 100% 100%     Intake/Output Summary (Last 24 hours) at 04/25/14 1355 Last data filed at 04/25/14 1326  Gross per 24 hour  Intake    798 ml  Output   1801 ml  Net  -1003 ml   Filed Weights   04/24/14 0500 04/24/14 1256 04/25/14 1300  Weight: 68.5 kg (151 lb 0.2 oz) 78.5 kg (173 lb 1 oz) 78.2 kg (172 lb 6.4 oz)     Exam:  General exam: Pleasant elderly frail male lying comfortably supine in bed. Respiratory system:  Clear. No increased work of breathing. Cardiovascular system: S1 & S2 heard, RRR. No JVD, murmurs, gallops, clicks or pedal edema.  Gastrointestinal system: Abdomen is nondistended, soft and nontender. Normal bowel sounds heard. Central nervous system: Alert and oriented. No focal neurological deficits. Extremities: Symmetric 5 x 5 power. Right leg appears mildly asymmetrically swollen compared to left. Bilateral patchy erythema extending from lower two thirds of the leg to dorsum of feet (continues to decrease)  right >left.  No tenderness. No fluctuation or crepitus. No open wounds. Petechiae ++   Data Reviewed: Basic Metabolic Panel:  Recent Labs Lab 04/21/14 0450 04/22/14 0519 04/23/14 0525 04/24/14 0525 04/25/14 0355  NA 137 139 138 134* 139  K 3.0* 3.1* 3.6 3.2* 3.7  CL 104 107 107 104 106  CO2 24 23 23 22 23   GLUCOSE 124* 132* 111* 120* 114*  BUN 31* 30* 29* 32* 31*  CREATININE 1.61* 1.64* 1.59* 1.52* 1.56*  CALCIUM 8.0* 8.1* 8.2* 8.1* 8.4   Liver Function Tests:  Recent Labs Lab 04/19/14 1453 04/23/14 0525 04/24/14 0525 04/25/14 0355  AST 54* 329* 251* 249*  ALT 43 209* 225* 240*  ALKPHOS 209* 187* 180* 202*  BILITOT 2.1* 2.8* 1.7* 2.4*  PROT 6.1 5.2* 5.2* 5.3*  ALBUMIN 2.8* 2.1* 2.1* 2.1*    Recent Labs Lab 04/19/14 1453 04/23/14 0525  LIPASE 19 18   No results for input(s): AMMONIA in the last 168 hours. CBC:  Recent Labs Lab 04/19/14 1453  04/22/14 0500 04/23/14 0525 04/23/14 2315 04/24/14 0525 04/25/14 0355  WBC 9.9  < > 1.7* 1.6* 1.2* 1.5* 1.5*  NEUTROABS 9.5*  --  1.2* 1.2*  --   --  1.0*  HGB 8.7*  < > 7.2* 8.9* 10.4* 8.7* 8.0*  HCT 25.7*  < > 21.0* 25.4* 30.0* 24.9* 23.6*  MCV 97.0  < > 93.8 90.1 89.0 89.9 90.4  PLT 98*  < > 23* 11* 19* 17* 7*  < > = values in this interval not displayed. Cardiac Enzymes:  Recent Labs Lab 04/19/14 1524  TROPONINI <0.03   BNP (last 3 results) No results for input(s): PROBNP in the last 8760 hours. CBG: No results for input(s): GLUCAP in the last 168 hours.  Recent Results (from the past 240 hour(s))  Culture, blood (routine x 2)     Status: None (Preliminary result)   Collection Time: 04/19/14 10:25 PM  Result Value Ref Range Status   Specimen Description BLOOD LEFT ANTECUBITAL  Final   Special Requests BOTTLES DRAWN AEROBIC AND ANAEROBIC 10CC  Final   Culture   Final           BLOOD CULTURE RECEIVED NO GROWTH TO DATE CULTURE WILL BE HELD FOR 5 DAYS BEFORE ISSUING A FINAL NEGATIVE REPORT Note:  Culture results may be compromised due to an excessive volume of blood received in culture bottles. Performed at [facility]    Report Status PENDING  Incomplete  Culture, blood (routine x 2)     Status: None (Preliminary result)   Collection Time: 04/19/14 10:30 PM  Result Value Ref Range Status   Specimen Description BLOOD RIGHT ANTECUBITAL  Final   Special Requests BOTTLES DRAWN AEROBIC AND ANAEROBIC 5CC  Final   Culture   Final           BLOOD CULTURE RECEIVED NO GROWTH TO DATE CULTURE WILL BE HELD FOR 5 DAYS BEFORE ISSUING A FINAL NEGATIVE REPORT Performed at [facility]    Report  Status PENDING  Incomplete  MRSA PCR Screening     Status: None   Collection Time: 04/23/14  1:15 PM  Result Value Ref Range Status   MRSA by PCR NEGATIVE NEGATIVE Final    Comment:        The GeneXpert MRSA Assay (FDA approved for NASAL specimens only), is one component of a comprehensive MRSA colonization surveillance program. It is not intended to diagnose MRSA infection nor to guide or monitor treatment for MRSA infections.           Studies: Mr Herby Abraham Contrast  04/24/2014   CLINICAL DATA:  81 year old male with history of metastatic non small cell lung cancer with brain metastases treated with stereotactic radiation therapy. Currently undergoing systemic maintenance chemotherapy. Weakness seizure 04/23/2014. Subsequent encounter.  EXAM: MRI HEAD WITHOUT CONTRAST  TECHNIQUE: Multiplanar, multiecho pulse sequences of the brain and surrounding structures were obtained without intravenous contrast.  COMPARISON:  04/23/2014 head CT.  12/16/2013 brain MR.  FINDINGS: On the 12/16/2013 contrast-enhanced brain MR, patient was noted to have 3 small intracranial metastatic lesions (right paracentral inferior vermis, medial left occipital lobe and posterior right frontal lobe). Evaluation of these intracranial metastatic lesions is limited on the present noncontrast motion degraded  exam.  Mild vasogenic edema right paracentral/ superior vermis slightly more notable than on the prior MR may reflect result of underlying tumor and/or treatment of tumor.  Minimal edema at the level of the previously noted small medial left occipital lobe may reflect result of underlying tumor and/or treatment of tumor.  No area of restricted motion to suggest intracranial metastatic disease or acute infarct.  Mild to moderate nonspecific white matter type changes may reflect result of small vessel disease and/or treatment tumor.  Global atrophy without hydrocephalus.  Major intracranial vascular structures are patent.  Cervical medullary junction, pituitary region, pineal region and orbital structures unremarkable.  Minimal to mild paranasal sinus mucosal thickening.  No evidence of mesial temporal sclerosis.  IMPRESSION: On the 12/16/2013 contrast-enhanced brain MR, patient was noted to have 3 small intracranial metastatic lesions (right paracentral inferior vermis, medial left occipital lobe and posterior right frontal lobe). Evaluation of these intracranial metastatic lesions is limited on the present noncontrast motion degraded exam.  Mild vasogenic edema right paracentral/ superior vermis slightly more notable than on the prior MR may reflect result of underlying tumor and/or treatment of tumor.  Minimal edema at the level of the previously noted small medial left occipital lobe may reflect result of underlying tumor and/or treatment of tumor.  No area of restricted motion to suggest intracranial metastatic disease or acute infarct.  Mild to moderate nonspecific white matter type changes may reflect result of small vessel disease and/or treatment tumor.  Global atrophy without hydrocephalus.   Electronically Signed   By: Genia Del M.D.   On: 04/24/2014 12:25   US Abdomen Limited Ruq  04/23/2014   CLINICAL DATA:  Abnormal LFTs.  EXAM: US ABDOMEN LIMITED - RIGHT UPPER QUADRANT  COMPARISON:  None.   FINDINGS: Gallbladder:  Previous cholecystectomy.  Common bile duct:  Diameter: 2 mm  Liver:  No focal lesion identified. Within normal limits in parenchymal echogenicity.  Other:  Right pleural effusion noted.  IMPRESSION: 1. No findings to explain patient's elevated LFTs. 2. Prior cholecystectomy 3. Right pleural effusion   Electronically Signed   By: Kerby Moors M.D.   On: 04/23/2014 16:29        Scheduled Meds: . sodium chloride   Intravenous  Once  . antiseptic oral rinse  7 mL Mouth Rinse q12n4p  . chlorhexidine  15 mL Mouth Rinse BID  . dexamethasone  4 mg Oral BID  . doxycycline  100 mg Oral BID  . feeding supplement (ENSURE ENLIVE)  237 mL Oral TID BM  . ferrous sulfate  325 mg Oral Q lunch  . folic acid  072 mcg Oral Daily  . levETIRAcetam  500 mg Intravenous Q12H  . megestrol  400 mg Oral BID  . nystatin  5 mL Oral QID  . polyvinyl alcohol   Both Eyes Daily  . Tbo-filgastrim (GRANIX) SQ  300 mcg Subcutaneous q1800   Continuous Infusions:    Principal Problem:   Cellulitis of leg, right Active Problems:   CKD (chronic kidney disease)   Antineoplastic chemotherapy induced pancytopenia   Pericardial effusion without cardiac tamponade   COPD (chronic obstructive pulmonary disease)   Primary cancer of right lower lobe of lung   Tachycardia   Oral thrush   Protein-calorie malnutrition, severe   Convulsions/seizures    Time spent: 30 minutes.    Vernell Leep, MD, FACP, FHM. [facility] Pager [phone]  If 7PM-7AM, please contact night-coverage [email or web address] Password [email or web address] 04/25/2014, 1:55 PM    LOS: 6 days

## 2014-04-25 NOTE — Progress Notes (Signed)
Spoke with MD regarding lab results.  Will continue to monitor.

## 2014-04-25 NOTE — Progress Notes (Signed)
Alerted MD of WBC 1.45 and Platelets 38, via text.

## 2014-04-26 ENCOUNTER — Ambulatory Visit: Payer: Medicare Other | Admitting: Radiation Oncology

## 2014-04-26 ENCOUNTER — Inpatient Hospital Stay: Admit: 2014-04-26 | Payer: Medicare Other | Admitting: Radiation Oncology

## 2014-04-26 LAB — PREPARE PLATELET PHERESIS: UNIT DIVISION: 0

## 2014-04-26 LAB — CULTURE, BLOOD (ROUTINE X 2)
CULTURE: NO GROWTH
Culture: NO GROWTH

## 2014-04-26 LAB — CBC WITH DIFFERENTIAL/PLATELET
BASOS ABS: 0 10*3/uL (ref 0.0–0.1)
Basophils Relative: 0 % (ref 0–1)
Eosinophils Absolute: 0 10*3/uL (ref 0.0–0.7)
Eosinophils Relative: 1 % (ref 0–5)
HEMATOCRIT: 22.8 % — AB (ref 39.0–52.0)
Hemoglobin: 8 g/dL — ABNORMAL LOW (ref 13.0–17.0)
LYMPHS PCT: 8 % — AB (ref 12–46)
Lymphs Abs: 0.1 10*3/uL — ABNORMAL LOW (ref 0.7–4.0)
MCH: 31.7 pg (ref 26.0–34.0)
MCHC: 35.1 g/dL (ref 30.0–36.0)
MCV: 90.5 fL (ref 78.0–100.0)
Monocytes Absolute: 0 10*3/uL — ABNORMAL LOW (ref 0.1–1.0)
Monocytes Relative: 1 % — ABNORMAL LOW (ref 3–12)
NEUTROS ABS: 1.4 10*3/uL — AB (ref 1.7–7.7)
Neutrophils Relative %: 90 % — ABNORMAL HIGH (ref 43–77)
Platelets: 27 10*3/uL — CL (ref 150–400)
RBC: 2.52 MIL/uL — ABNORMAL LOW (ref 4.22–5.81)
RDW: 17.3 % — AB (ref 11.5–15.5)
WBC: 1.5 10*3/uL — ABNORMAL LOW (ref 4.0–10.5)

## 2014-04-26 LAB — COMPREHENSIVE METABOLIC PANEL
ALBUMIN: 2.2 g/dL — AB (ref 3.5–5.2)
ALK PHOS: 223 U/L — AB (ref 39–117)
ALT: 281 U/L — ABNORMAL HIGH (ref 0–53)
AST: 269 U/L — ABNORMAL HIGH (ref 0–37)
Anion gap: 11 (ref 5–15)
BUN: 33 mg/dL — ABNORMAL HIGH (ref 6–23)
CHLORIDE: 106 mmol/L (ref 96–112)
CO2: 23 mmol/L (ref 19–32)
Calcium: 8.7 mg/dL (ref 8.4–10.5)
Creatinine, Ser: 1.61 mg/dL — ABNORMAL HIGH (ref 0.50–1.35)
GFR calc Af Amer: 46 mL/min — ABNORMAL LOW (ref >90)
GFR calc non Af Amer: 39 mL/min — ABNORMAL LOW (ref >90)
Glucose, Bld: 146 mg/dL — ABNORMAL HIGH (ref 70–99)
POTASSIUM: 4.1 mmol/L (ref 3.5–5.1)
Sodium: 140 mmol/L (ref 135–145)
Total Bilirubin: 2.1 mg/dL — ABNORMAL HIGH (ref 0.3–1.2)
Total Protein: 5.8 g/dL — ABNORMAL LOW (ref 6.0–8.3)

## 2014-04-26 MED ORDER — HYDROCERIN EX CREA
TOPICAL_CREAM | Freq: Two times a day (BID) | CUTANEOUS | Status: DC
  Administered 2014-04-26 (×2): via TOPICAL
  Administered 2014-04-27: 1 via TOPICAL
  Administered 2014-04-27 – 2014-05-02 (×10): via TOPICAL
  Filled 2014-04-26 (×2): qty 113

## 2014-04-26 MED ORDER — SALINE SPRAY 0.65 % NA SOLN
1.0000 | NASAL | Status: DC | PRN
  Administered 2014-04-26 – 2014-04-28 (×2): 1 via NASAL
  Filled 2014-04-26: qty 44

## 2014-04-26 NOTE — Progress Notes (Signed)
NUTRITION FOLLOW-UP  DOCUMENTATION CODES Per approved criteria  -Severe malnutrition in the context of chronic illness   INTERVENTION: Continue Ensure Enlive po TID, each supplement provides 350 kcal and 20 grams of protein (discussed with pt how many kcal/protein these would provide him).   Encouraged food from home. -intermittently receiving  NUTRITION DIAGNOSIS: Malnutrition related to chronic illness as evidenced by severe fat and muscle depletion; ongoing.   Goal: Pt to meet >/= 90% of their estimated nutrition needs; not met.   Monitor:  PO intake, supplement acceptance, weight trends, labs  ASSESSMENT: Pt with hx of NSCLC with mets to brain admitted with cellulitis of right leg now on antibiotics.   Pt had seizure 4/8 for approximately 45 seconds and was transferred to ICU/stepdown. EEG was mainly normal. Pt now back on [address] [place].   Pt continues with bilateral cellulitis. Still have not met any family during visits to pt but RN reports family in yesterday evening. Pt reports family brought food from [employer] and that he ate this without issue. He states that all foods from the kitchen here cause him to "heave." When asked if he feels nauseous before these occurences pt reports he is unsure as he stops eating as soon as it happens. Pt is a very picky eater and states that he does not like the foods he has tried here. He states he was doing well with the pancakes, cream of potato soup, and corn muffin but even those no longer suit him. Discussed food options at length with pt and RN in room assisting. Pt willing to try omelet with cheese and bacon for lunch; ordered this for pt.   He states that he has been drinking the Ensure as long as it is over ice. Encouraged ongoing use of supplement with poor intakes of other food items. Pt continues to not meet needs. Megace order ongoing.  Labs reviewed and reveal worsening kidney function for pt with stage 3 CKD.   Height: Ht Readings from  Last 1 Encounters:  04/24/14 _0  (1.803 m)    Weight: Wt Readings from Last 1 Encounters:  04/26/14 166 lb 10.7 oz (75.6 kg)    BMI:  Body mass index is 23.26 kg/(m^2).  Estimated Nutritional Needs: Kcal: 2100-2300 Protein: 100-115 grams Fluid: >2.1 L/day  Skin: cellulitis to BLE  Diet Order: Diet regular Room service appropriate?: Yes; Fluid consistency:: Thin   Intake/Output Summary (Last 24 hours) at 04/26/14 1126 Last data filed at 04/26/14 1031  Gross per 24 hour  Intake    370 ml  Output   2400 ml  Net  -2030 ml    Last BM: 04/24/14   Labs:   Recent Labs Lab 04/24/14 0525 04/25/14 0355 04/26/14 0459  NA 134* 139 140  K 3.2* 3.7 4.1  CL 104 106 106  CO2 _1 BUN 32* 31* 33*  CREATININE 1.52* 1.56* 1.61*  CALCIUM 8.1* 8.4 8.7  GLUCOSE 120* 114* 146*    CBG (last 3)  No results for input(s): GLUCAP in the last 72 hours.  Scheduled Meds: . antiseptic oral rinse  7 mL Mouth Rinse q12n4p  . chlorhexidine  15 mL Mouth Rinse BID  . dexamethasone  4 mg Oral BID  . doxycycline  100 mg Oral BID  . feeding supplement (ENSURE ENLIVE)  237 mL Oral TID BM  . ferrous sulfate  325 mg Oral Q lunch  . folic acid  161 mcg Oral Daily  . furosemide  20 mg Oral Daily  . hydrocerin   Topical BID  . levETIRAcetam  500 mg Oral BID  . megestrol  400 mg Oral BID  . nystatin  5 mL Oral QID  . polyvinyl alcohol   Both Eyes Daily  . Tbo-filgastrim (GRANIX) SQ  300 mcg Subcutaneous q1800     Jarome Matin, RD, LDN Inpatient Clinical Dietitian Pager # [phone] After hours/weekend pager # [phone]

## 2014-04-26 NOTE — Progress Notes (Signed)
PROGRESS NOTE    [name] HYW:[id number] DOB: 1933-01-19 DOA: 04/19/2014 PCP: [name], MD  Medical Oncology: Dr. Curt Bears Radiation Oncology:  HPI/Brief narrative 81 year old male with history of metastatic non-small cell lung cancer, adenocarcinoma with brain metastasis status post posterior tactic radiotherapy to the brain lesions followed by palliative radiotherapy to the left lower lobe lung mass, currently undergoing systemic maintenance chemotherapy with Alimta-completed 15th cycle on 3/29, COPD, admitted to [facility] on 04/19/14 with complaints of swelling and redness of RLE. Lower extremity venous Dopplers negative for DVT. Admitted for cellulitis. Hospital course complicated by chemotherapy related severe pancytopenia requiring transfusions of PRBC and platelets and Granix shots and new onset seizure 1 for which she was transferred to stepdown unit on 4/8.   Assessment/Plan:  Principal Problem:   Cellulitis of legs, R>L - Initially started empirically on IV ceftriaxone for cellulitis of RLE. - Bilateral lower extremity venous Dopplers negative for DVT. - Reassessment on 4/6 showed findings suspicious for cellulitis in both legs without significant improvement in right lower extremity as per patient and family. - Rocephin was discontinued and started IV vancomycin on 4/6. - Monitor clinically. - Patient had 1+ pitting bilateral leg edema, R >L. Rest of cellulitis-like changes did not seem a whole lot better on 4/7 compared to day prior.? Some of his findings related to petechia from thrombocytopenia. - s/p IV Lasix 1 on 4/7 - Blood cultures 2: Negative. - Lower extremities seem to be improving with decreased erythema, warmth and tenderness but still has significant edema. His lower extremity findings now suggestive more of petechia related to thrombocytopenia. Started PO Lasix for leg edema. -  DC vancomycin and started oral doxycycline 4/9.  -  Complete 10 days of antibiotics on 4/13 - Leg edema improving after IV Lasix-continue  Active Problems:   Seizure 1 on 4/8 - Patient had an episode of witnessed seizure (absence like) lasting approximately 45 seconds on 4/8 AM. Aborted spontaneously. - CT head without contrast: No acute findings.  - MRI brain results as below. Performed without contrast secondary to renal insufficiency. Discussed results with Dr. Julien Nordmann 4/10 who recommended starting dexamethasone. - EEG-Report pending  - Loaded with a gram of IV Keppra and continue maintenance Keppra 500 mg every 12 hours. - Monitor for seizures-No further seizures - Patient counseled extensively that he should not drive or operate heavy machinery or be at heights or in standing water for at least 6 months and medical clearance from M.D. He verbalized understanding  - We'll discuss with patient's radiation oncologist Dr. Tammi Klippel.    Hypotension - Patient had  transient hypotension with systolic BPs in the 48N post seizure episode on 4/8. - resolved after IV fluids    CKD (chronic kidney disease), stage 3 - Creatinine at baseline. - Follow BMP periodically.    Antineoplastic chemotherapy induced pancytopenia - Last chemotherapy 3/29. - S/P 1 unit PRBC on 4/6 for hemoglobin of 7 > only improved to 7.2. Transfuse additional 2 units PRBC's and follow CBC. - DC'ed heparin secondary to platelet count of 40 K on 4/6. 23 K on 4/7 - No reported bleeding. Platelet transfusion if active bleeding or platelet count <10 K - Patient had a brief seizure episode soon after starting platelet transfusion-same was stopped and returned to blood bank as per protocol. Awaiting new platelets for transfusion. - Brief mild left nostril epistaxis-resolved spontaneously. - Patient is day 4 of Granix for leukopenia/neutropenia. WBC stable at 1.5 and ANC improved  to 1.4. - Status post 2 bags of platelets on 4/8 and 4/10. Platelets improved to 27 - Hemoglobin  stable - Follow CBCs in a.m.    Pericardial effusion without cardiac tamponade - 2-D echo shows small to moderate free-flowing pericardial effusion - Patient denies chest pain or dyspnea. Did report some dyspnea on admission which is probably multifactorial from lung mass, anemia. - No clinical features suggestive of cardiac tamponade. Monitor closely. - Cardiology consultation appreciated-no intervention needed at this time.    COPD (chronic obstructive pulmonary disease) - Stable      Primary cancer of right lower lobe of lung - Oncology input appreciated. Apparently will not continue Alimta    Tachycardia - Asymptomatic and mild - 2-D echo with normal EF. - Likely secondary to anemia and ongoing infection. - Stable and telemetry was discontinued.    Oral thrush - Continue nystatin    Protein-calorie malnutrition, severe - Management per dietitian.    Hypokalemia - Replaced     Dyspnea - Multifactorial as stated above - VQ scan: Low probability for PE  - Improved/resolved   Abnormal LFTs - Unclear etiology.  - Patient today stated that he never had any GI symptoms.  - RUQ ultrasound: No acute findings to explain elevated LFTs. - Lipase: 18 - As discussed with oncologist,? Related to Alimta. Improving - Temporarily hold statins  Code Status: Full Family Communication: Discussed with spouse on 4/8 Disposition Plan: DC home in 2-3 days, when medically stable. Transferred to medical bed.   Consultants:  Cardiology  Medical oncology  Procedures:   2 D Echo: Study Conclusions  - Left ventricle: The cavity size was normal. There was mild concentric hypertrophy. Systolic function was normal. The estimated ejection fraction was in the range of 55% to 60%. Wall motion was normal; there were no regional wall motion abnormalities. There was an increased relative contribution of atrial contraction to ventricular filling. Doppler parameters  are consistent with abnormal left ventricular relaxation (grade 1 diastolic dysfunction). - Aortic valve: Moderate diffuse thickening and calcification, consistent with sclerosis. - Aorta: Aortic root dimension: 44 mm (ED). - Aortic root: The aortic root was mildly dilated. - Mitral valve: Calcified annulus. - Pulmonic valve: There was trivial regurgitation. - Pericardium, extracardiac: A small to moderate, free-flowing pericardial effusion was identified along the left ventricular free wall and at the apex. The fluid had no internal echoes.  Antibiotics:  IV Rocephin-DC'd 4/6  IV vancomycin 4/6 > 4/9   PO doxycycline 4/9 >  Subjective: States that his legs feeling much better with improved swelling and minimal intermittent pain. Complains of dryness of the nose and occasional minimal left nostril bleeding on blowing nose.  Objective: Filed Vitals:   04/25/14 1533 04/25/14 2135 04/26/14 0553 04/26/14 0640  BP: 109/59 123/70 106/65   Pulse: 108 103 113   Temp: 97.8 F (36.6 C) 97.5 F (36.4 C) 98.3 F (36.8 C)   TempSrc: Oral Oral Oral   Resp: 22 20 20    Height:      Weight:    75.6 kg (166 lb 10.7 oz)  SpO2: 100% 99% 100%     Intake/Output Summary (Last 24 hours) at 04/26/14 0845 Last data filed at 04/26/14 0843  Gross per 24 hour  Intake    658 ml  Output   2800 ml  Net  -2142 ml   Filed Weights   04/24/14 1256 04/25/14 1300 04/26/14 0640  Weight: 78.5 kg (173 lb 1 oz) 78.2 kg (172 lb  6.4 oz) 75.6 kg (166 lb 10.7 oz)     Exam:  General exam: Pleasant elderly frail male lying comfortably supine in bed. Respiratory system: Clear. No increased work of breathing. Cardiovascular system: S1 & S2 heard, RRR. No JVD, murmurs, gallops, clicks or pedal edema.  Gastrointestinal system: Abdomen is nondistended, soft and nontender. Normal bowel sounds heard. Central nervous system: Alert and oriented. No focal neurological deficits. Extremities: Symmetric 5 x  5 power. Right leg appears mildly asymmetrically swollen compared to left. 1+ pitting bilateral leg edema right >left-improving. Extensive petechiae bilateral legs and patchy minimal erythema which is decreasing.  Data Reviewed: Basic Metabolic Panel:  Recent Labs Lab 04/22/14 0519 04/23/14 0525 04/24/14 0525 04/25/14 0355 04/26/14 0459  NA 139 138 134* 139 140  K 3.1* 3.6 3.2* 3.7 4.1  CL 107 107 104 106 106  CO2 23 23 22 23 23   GLUCOSE 132* 111* 120* 114* 146*  BUN 30* 29* 32* 31* 33*  CREATININE 1.64* 1.59* 1.52* 1.56* 1.61*  CALCIUM 8.1* 8.2* 8.1* 8.4 8.7   Liver Function Tests:  Recent Labs Lab 04/19/14 1453 04/23/14 0525 04/24/14 0525 04/25/14 0355 04/26/14 0459  AST 54* 329* 251* 249* 269*  ALT 43 209* 225* 240* 281*  ALKPHOS 209* 187* 180* 202* 223*  BILITOT 2.1* 2.8* 1.7* 2.4* 2.1*  PROT 6.1 5.2* 5.2* 5.3* 5.8*  ALBUMIN 2.8* 2.1* 2.1* 2.1* 2.2*    Recent Labs Lab 04/19/14 1453 04/23/14 0525  LIPASE 19 18   No results for input(s): AMMONIA in the last 168 hours. CBC:  Recent Labs Lab 04/22/14 0500 04/23/14 0525 04/23/14 2315 04/24/14 0525 04/25/14 0355 04/25/14 1400 04/26/14 0459  WBC 1.7* 1.6* 1.2* 1.5* 1.5* 1.5* 1.5*  NEUTROABS 1.2* 1.2*  --   --  1.0* 1.0* 1.4*  HGB 7.2* 8.9* 10.4* 8.7* 8.0* 7.5* 8.0*  HCT 21.0* 25.4* 30.0* 24.9* 23.6* 21.8* 22.8*  MCV 93.8 90.1 89.0 89.9 90.4 91.2 90.5  PLT 23* 11* 19* 17* 7* 38* 27*   Cardiac Enzymes:  Recent Labs Lab 04/19/14 1524  TROPONINI <0.03   BNP (last 3 results) No results for input(s): PROBNP in the last 8760 hours. CBG: No results for input(s): GLUCAP in the last 168 hours.  Recent Results (from the past 240 hour(s))  Culture, blood (routine x 2)     Status: None   Collection Time: 04/19/14 10:25 PM  Result Value Ref Range Status   Specimen Description BLOOD LEFT ANTECUBITAL  Final   Special Requests BOTTLES DRAWN AEROBIC AND ANAEROBIC 10CC  Final   Culture   Final    NO GROWTH  5 DAYS Note: Culture results may be compromised due to an excessive volume of blood received in culture bottles. Performed at [facility]    Report Status 04/26/2014 FINAL  Final  Culture, blood (routine x 2)     Status: None   Collection Time: 04/19/14 10:30 PM  Result Value Ref Range Status   Specimen Description BLOOD RIGHT ANTECUBITAL  Final   Special Requests BOTTLES DRAWN AEROBIC AND ANAEROBIC 5CC  Final   Culture   Final    NO GROWTH 5 DAYS Performed at [facility]    Report Status 04/26/2014 FINAL  Final  MRSA PCR Screening     Status: None   Collection Time: 04/23/14  1:15 PM  Result Value Ref Range Status   MRSA by PCR NEGATIVE NEGATIVE Final    Comment:  The GeneXpert MRSA Assay (FDA approved for NASAL specimens only), is one component of a comprehensive MRSA colonization surveillance program. It is not intended to diagnose MRSA infection nor to guide or monitor treatment for MRSA infections.           Studies: Mr Herby Abraham Contrast  04/24/2014   CLINICAL DATA:  81 year old male with history of metastatic non small cell lung cancer with brain metastases treated with stereotactic radiation therapy. Currently undergoing systemic maintenance chemotherapy. Weakness seizure 04/23/2014. Subsequent encounter.  EXAM: MRI HEAD WITHOUT CONTRAST  TECHNIQUE: Multiplanar, multiecho pulse sequences of the brain and surrounding structures were obtained without intravenous contrast.  COMPARISON:  04/23/2014 head CT.  12/16/2013 brain MR.  FINDINGS: On the 12/16/2013 contrast-enhanced brain MR, patient was noted to have 3 small intracranial metastatic lesions (right paracentral inferior vermis, medial left occipital lobe and posterior right frontal lobe). Evaluation of these intracranial metastatic lesions is limited on the present noncontrast motion degraded exam.  Mild vasogenic edema right paracentral/ superior vermis slightly more notable than on the prior  MR may reflect result of underlying tumor and/or treatment of tumor.  Minimal edema at the level of the previously noted small medial left occipital lobe may reflect result of underlying tumor and/or treatment of tumor.  No area of restricted motion to suggest intracranial metastatic disease or acute infarct.  Mild to moderate nonspecific white matter type changes may reflect result of small vessel disease and/or treatment tumor.  Global atrophy without hydrocephalus.  Major intracranial vascular structures are patent.  Cervical medullary junction, pituitary region, pineal region and orbital structures unremarkable.  Minimal to mild paranasal sinus mucosal thickening.  No evidence of mesial temporal sclerosis.  IMPRESSION: On the 12/16/2013 contrast-enhanced brain MR, patient was noted to have 3 small intracranial metastatic lesions (right paracentral inferior vermis, medial left occipital lobe and posterior right frontal lobe). Evaluation of these intracranial metastatic lesions is limited on the present noncontrast motion degraded exam.  Mild vasogenic edema right paracentral/ superior vermis slightly more notable than on the prior MR may reflect result of underlying tumor and/or treatment of tumor.  Minimal edema at the level of the previously noted small medial left occipital lobe may reflect result of underlying tumor and/or treatment of tumor.  No area of restricted motion to suggest intracranial metastatic disease or acute infarct.  Mild to moderate nonspecific white matter type changes may reflect result of small vessel disease and/or treatment tumor.  Global atrophy without hydrocephalus.   Electronically Signed   By: Genia Del M.D.   On: 04/24/2014 12:25        Scheduled Meds: . sodium chloride   Intravenous Once  . antiseptic oral rinse  7 mL Mouth Rinse q12n4p  . chlorhexidine  15 mL Mouth Rinse BID  . dexamethasone  4 mg Oral BID  . doxycycline  100 mg Oral BID  . feeding supplement  (ENSURE ENLIVE)  237 mL Oral TID BM  . ferrous sulfate  325 mg Oral Q lunch  . folic acid  540 mcg Oral Daily  . furosemide  20 mg Oral Daily  . levETIRAcetam  500 mg Oral BID  . megestrol  400 mg Oral BID  . nystatin  5 mL Oral QID  . polyvinyl alcohol   Both Eyes Daily  . Tbo-filgastrim (GRANIX) SQ  300 mcg Subcutaneous q1800   Continuous Infusions:    Principal Problem:   Cellulitis of leg, right Active Problems:   CKD (chronic kidney disease)  Antineoplastic chemotherapy induced pancytopenia   Pericardial effusion without cardiac tamponade   COPD (chronic obstructive pulmonary disease)   Primary cancer of right lower lobe of lung   Tachycardia   Oral thrush   Protein-calorie malnutrition, severe   Convulsions/seizures    Time spent: 20 minutes.    Vernell Leep, MD, FACP, FHM. [facility] Pager [phone]  If 7PM-7AM, please contact night-coverage [email or web address] Password [email or web address] 04/26/2014, 8:45 AM    LOS: 7 days

## 2014-04-26 NOTE — Progress Notes (Signed)
Radiation Oncology         [phone]) [phone] ________________________________  Name: [name] MRN: [id number]  Date: 04/19/2014  DOB: [birth year]   Narrative:  The patient returns today for routine follow-up.  I received a call regarding the patient today and had an opportunity to visit him today. He has been admitted for cellulitis. He also was observed to have experienced what was felt to be a absence seizure.  He has undergone imaging of the brain and has begun the Decadron and Keppra. He has been seen by Dr. Earlie Server from medical oncology.   The patient today states that he feels fairly well overall. He denies any further seizures.                     ALLERGIES:  is allergic to asa and sulfa antibiotics.  Meds: Current Facility-Administered Medications  Medication Dose Route Frequency Provider Last Rate Last Dose  . acetaminophen (TYLENOL) tablet 500 mg  500 mg Oral Q6H PRN Modena Jansky, MD   500 mg at 04/26/14 7989  . antiseptic oral rinse (CPC / CETYLPYRIDINIUM CHLORIDE 0.05%) solution 7 mL  7 mL Mouth Rinse q12n4p Brenton Grills, RN   7 mL at 04/25/14 1600  . calcium carbonate (TUMS - dosed in mg elemental calcium) chewable tablet 200 mg of elemental calcium  1 tablet Oral BID PRN Dianne Dun, NP   200 mg of elemental calcium at 04/25/14 2241  . chlorhexidine (PERIDEX) 0.12 % solution 15 mL  15 mL Mouth Rinse BID Brenton Grills, RN   15 mL at 04/26/14 0834  . clobetasol cream (TEMOVATE) 2.11 % 1 application  1 application Topical Daily PRN Etta Quill, DO   1 application at 94/17/40 1916  . dexamethasone (DECADRON) tablet 4 mg  4 mg Oral BID Modena Jansky, MD   4 mg at 04/26/14 1055  . diphenhydrAMINE (BENADRYL) capsule 25 mg  25 mg Oral Q6H PRN Modena Jansky, MD   25 mg at 04/23/14 1915  . doxycycline (VIBRA-TABS) tablet 100 mg  100 mg Oral BID Modena Jansky, MD   100 mg at 04/26/14 8144  . feeding supplement (ENSURE ENLIVE) (ENSURE ENLIVE) liquid 237  mL  237 mL Oral TID BM Kelvin Cellar, MD   237 mL at 04/26/14 1055  . ferrous sulfate tablet 325 mg  325 mg Oral Q lunch Modena Jansky, MD   325 mg at 04/26/14 1212  . folic acid (FOLVITE) tablet 0.5 mg  500 mcg Oral Daily Etta Quill, DO   0.5 mg at 04/26/14 1055  . furosemide (LASIX) tablet 20 mg  20 mg Oral Daily Modena Jansky, MD   20 mg at 04/26/14 1055  . hydrocerin (EUCERIN) cream   Topical BID Modena Jansky, MD      . levETIRAcetam (KEPPRA) tablet 500 mg  500 mg Oral BID Modena Jansky, MD   500 mg at 04/26/14 1055  . LORazepam (ATIVAN) injection 1 mg  1 mg Intravenous Q4H PRN Modena Jansky, MD      . magic mouthwash  5 mL Oral TID PRN Etta Quill, DO   5 mL at 04/19/14 2241  . megestrol (MEGACE) 400 MG/10ML suspension 400 mg  400 mg Oral BID Kelvin Cellar, MD   400 mg at 04/26/14 1054  . nystatin (MYCOSTATIN) 100000 UNIT/ML suspension 500,000 Units  5 mL Oral QID Etta Quill, DO  500,000 Units at 04/26/14 1731  . polyvinyl alcohol (LIQUIFILM TEARS) 1.4 % ophthalmic solution   Both Eyes Daily Etta Quill, DO   1 drop at 04/26/14 1000  . sodium chloride (OCEAN) 0.65 % nasal spray 1 spray  1 spray Each Nare PRN Modena Jansky, MD   1 spray at 04/26/14 1212  . sodium chloride 0.9 % injection 10-40 mL  10-40 mL Intracatheter PRN Kelvin Cellar, MD   10 mL at 04/26/14 0459    Physical Findings: The patient is in no acute distress. Patient is alert and oriented.  height is 5\' 11"  (1.803 m) and weight is 166 lb 10.7 oz (75.6 kg). His oral temperature is 97.8 F (36.6 C). His blood pressure is 114/74 and his pulse is 100. His respiration is 20 and oxygen saturation is 100%. .     Lab Findings: Lab Results  Component Value Date   WBC 1.5* 04/26/2014   HGB 8.0* 04/26/2014   HCT 22.8* 04/26/2014   MCV 90.5 04/26/2014   PLT 27* 04/26/2014     Radiographic Findings: Dg Chest 2 View  04/19/2014   CLINICAL DATA:  Shortness of breath, leg swelling, lung  cancer, initial encounter.  EXAM: CHEST  2 VIEW  COMPARISON:  03/06/2014 and CT chest 03/04/2014.  FINDINGS: Trachea is midline. Heart size stable. Right IJ power port tip is in the high right atrium. Right perihilar opacification and architectural distortion appear grossly stable. Small right pleural effusion with volume loss at the base of the right hemi thorax. Reticular coarsening in the periphery of the left hemi thorax. No left pleural fluid.  IMPRESSION: 1. Right perihilar masslike opacification with architectural distortion, unchanged from 03/06/2014 and better interrogated on cross-sectional imaging 03/04/2014. 2. Small right pleural effusion, possibly increased. 3. Subpleural reticulation, indicative of fibrosis.   Electronically Signed   By: Lorin Picket M.D.   On: 04/19/2014 16:10   Ct Head Wo Contrast  04/23/2014   CLINICAL DATA:  New seizure activity  EXAM: CT HEAD WITHOUT CONTRAST  TECHNIQUE: Contiguous axial images were obtained from the base of the skull through the vertex without intravenous contrast.  COMPARISON:  None.  FINDINGS: Bony calvarium is intact. Mild atrophic changes are noted. Basal ganglia calcifications are seen. No findings to suggest acute hemorrhage, acute infarction or space-occupying mass lesion are noted.  IMPRESSION: Chronic atrophic changes without acute abnormality.   Electronically Signed   By: Inez Catalina M.D.   On: 04/23/2014 13:11   Mr Brain Wo Contrast  04/24/2014   CLINICAL DATA:  81 year old male with history of metastatic non small cell lung cancer with brain metastases treated with stereotactic radiation therapy. Currently undergoing systemic maintenance chemotherapy. Weakness seizure 04/23/2014. Subsequent encounter.  EXAM: MRI HEAD WITHOUT CONTRAST  TECHNIQUE: Multiplanar, multiecho pulse sequences of the brain and surrounding structures were obtained without intravenous contrast.  COMPARISON:  04/23/2014 head CT.  12/16/2013 brain MR.  FINDINGS: On the  12/16/2013 contrast-enhanced brain MR, patient was noted to have 3 small intracranial metastatic lesions (right paracentral inferior vermis, medial left occipital lobe and posterior right frontal lobe). Evaluation of these intracranial metastatic lesions is limited on the present noncontrast motion degraded exam.  Mild vasogenic edema right paracentral/ superior vermis slightly more notable than on the prior MR may reflect result of underlying tumor and/or treatment of tumor.  Minimal edema at the level of the previously noted small medial left occipital lobe may reflect result of underlying tumor and/or treatment of tumor.  No area of restricted motion to suggest intracranial metastatic disease or acute infarct.  Mild to moderate nonspecific white matter type changes may reflect result of small vessel disease and/or treatment tumor.  Global atrophy without hydrocephalus.  Major intracranial vascular structures are patent.  Cervical medullary junction, pituitary region, pineal region and orbital structures unremarkable.  Minimal to mild paranasal sinus mucosal thickening.  No evidence of mesial temporal sclerosis.  IMPRESSION: On the 12/16/2013 contrast-enhanced brain MR, patient was noted to have 3 small intracranial metastatic lesions (right paracentral inferior vermis, medial left occipital lobe and posterior right frontal lobe). Evaluation of these intracranial metastatic lesions is limited on the present noncontrast motion degraded exam.  Mild vasogenic edema right paracentral/ superior vermis slightly more notable than on the prior MR may reflect result of underlying tumor and/or treatment of tumor.  Minimal edema at the level of the previously noted small medial left occipital lobe may reflect result of underlying tumor and/or treatment of tumor.  No area of restricted motion to suggest intracranial metastatic disease or acute infarct.  Mild to moderate nonspecific white matter type changes may reflect result  of small vessel disease and/or treatment tumor.  Global atrophy without hydrocephalus.   Electronically Signed   By: Genia Del M.D.   On: 04/24/2014 12:25   Nm Pulmonary Perf And Vent  04/19/2014   CLINICAL DATA:  Shortness of breath.  EXAM: NUCLEAR MEDICINE VENTILATION - PERFUSION LUNG SCAN  TECHNIQUE: Ventilation images were obtained in multiple projections using inhaled aerosol technetium 99 M DTPA. Perfusion images were obtained in multiple projections after intravenous injection of Tc-21m MAA.  RADIOPHARMACEUTICALS:  40.0 MCi Tc-30m DTPA aerosol and 5.0 mCi Tc-24m MAA  COMPARISON:  Chest x-ray 04/19/2014 and CT chest 03/04/2014  FINDINGS: Ventilation: Patchy heterogeneous ventilation study with some linear defects likely corresponding to pleural effusions and lung disease.  Perfusion: Patchy heterogeneous perfusion scan matching the ventilation scan. No mismatching segmental or subsegmental defects to suggest pulmonary embolism.  IMPRESSION: Low probability ventilation perfusion lung scan for pulmonary embolism.   Electronically Signed   By: Marijo Sanes M.D.   On: 04/19/2014 19:10   US Abdomen Limited Ruq  04/23/2014   CLINICAL DATA:  Abnormal LFTs.  EXAM: US ABDOMEN LIMITED - RIGHT UPPER QUADRANT  COMPARISON:  None.  FINDINGS: Gallbladder:  Previous cholecystectomy.  Common bile duct:  Diameter: 2 mm  Liver:  No focal lesion identified. Within normal limits in parenchymal echogenicity.  Other:  Right pleural effusion noted.  IMPRESSION: 1. No findings to explain patient's elevated LFTs. 2. Prior cholecystectomy 3. Right pleural effusion   Electronically Signed   By: Kerby Moors M.D.   On: 04/23/2014 16:29    Impression:    The patient has metastatic non-small cell lung cancer with a history of brain metastases. He has had one seizure as an inpatient on 04/23/2014. The patient's brain MRI scan was performed without contrast due to renal insufficiency. He has started Decadron and Keppra. The MRI  scan showed some mild vasogenic edema. No significant worsening of disease.  Plan:   -Agree with Decadron and Keppra. -With no further seizures, I do not feel strongly that the patient needs to undergo additional brain imaging at this time. -I will ask for the patient's case to be reviewed in brain tumor conference which should occur early next week. -I will schedule outpatient follow-up with the patient next week after his presentation.    Jodelle Gross, M.D., Ph.D.

## 2014-04-26 NOTE — Progress Notes (Signed)
CRITICAL VALUE ALERT  Critical value received: PLT 27  Date of notification:  04/26/2014  Time of notification:  0638  Critical value read back:Yes.    Nurse who received alert:  Carver Fila  MD notified (1st page):  [facility] A  Time of first page:  0708  MD notified (2nd page):  Time of second page:  Responding MD:  [place] A  Time MD responded:  07:09

## 2014-04-27 DIAGNOSIS — R600 Localized edema: Secondary | ICD-10-CM

## 2014-04-27 LAB — CBC WITH DIFFERENTIAL/PLATELET
BASOS ABS: 0 10*3/uL (ref 0.0–0.1)
BASOS PCT: 0 % (ref 0–1)
EOS ABS: 0 10*3/uL (ref 0.0–0.7)
Eosinophils Relative: 0 % (ref 0–5)
HCT: 21.5 % — ABNORMAL LOW (ref 39.0–52.0)
Hemoglobin: 7.5 g/dL — ABNORMAL LOW (ref 13.0–17.0)
Lymphocytes Relative: 10 % — ABNORMAL LOW (ref 12–46)
Lymphs Abs: 0.1 10*3/uL — ABNORMAL LOW (ref 0.7–4.0)
MCH: 31.5 pg (ref 26.0–34.0)
MCHC: 34.9 g/dL (ref 30.0–36.0)
MCV: 90.3 fL (ref 78.0–100.0)
Monocytes Absolute: 0.1 10*3/uL (ref 0.1–1.0)
Monocytes Relative: 5 % (ref 3–12)
Neutro Abs: 1.1 10*3/uL — ABNORMAL LOW (ref 1.7–7.7)
Neutrophils Relative %: 85 % — ABNORMAL HIGH (ref 43–77)
Platelets: 16 10*3/uL — CL (ref 150–400)
RBC: 2.38 MIL/uL — ABNORMAL LOW (ref 4.22–5.81)
RDW: 16.9 % — ABNORMAL HIGH (ref 11.5–15.5)
WBC: 1.3 10*3/uL — CL (ref 4.0–10.5)

## 2014-04-27 LAB — TRANSFUSION REACTION
DAT C3: NEGATIVE
Post RXN DAT IgG: NEGATIVE

## 2014-04-27 MED ORDER — TBO-FILGRASTIM 300 MCG/0.5ML ~~LOC~~ SOSY
300.0000 ug | PREFILLED_SYRINGE | Freq: Every day | SUBCUTANEOUS | Status: AC
  Administered 2014-04-27 – 2014-04-28 (×2): 300 ug via SUBCUTANEOUS
  Filled 2014-04-27 (×3): qty 0.5

## 2014-04-27 NOTE — Progress Notes (Signed)
PROGRESS NOTE    [name] CHY:[id number] DOB: December 22, 1933 DOA: 04/19/2014 PCP: [name], MD  Medical Oncology: Dr. Curt Bears Radiation Oncology:  HPI/Brief narrative 81 year old male with history of metastatic non-small cell lung cancer, adenocarcinoma with brain metastasis status post posterior tactic radiotherapy to the brain lesions followed by palliative radiotherapy to the left lower lobe lung mass, currently undergoing systemic maintenance chemotherapy with Alimta-completed 15th cycle on 3/29, COPD, admitted to [facility] on 04/19/14 with complaints of swelling and redness of RLE. Lower extremity venous Dopplers negative for DVT. Admitted for cellulitis. Hospital course complicated by chemotherapy related severe pancytopenia requiring transfusions of PRBC and platelets and Granix shots and new onset seizure 1 for which he was transferred to stepdown unit on 4/8. DC home when blood counts stable   Assessment/Plan:  Principal Problem:   Cellulitis of legs, R>L - Initially started empirically on IV ceftriaxone for cellulitis of RLE. - Bilateral lower extremity venous Dopplers negative for DVT. - Reassessment on 4/6 showed findings suspicious for cellulitis in both legs without significant improvement in right lower extremity as per patient and family. - Rocephin was discontinued and started IV vancomycin on 4/6. - Blood cultures 2: Negative. - Lower extremities have improved with decreased erythema, warmth and tenderness but still has significant edema. His lower extremity findings now suggestive more of petechia related to thrombocytopenia and pedal edema. Some of his erythema may be chronic. Started PO Lasix for leg edema. -  DC vancomycin and started oral doxycycline 4/9.  - Complete 10 days of antibiotics on 4/13 - Leg edema improving after PO Lasix. -10 L since admission. Follow clinically and BMP in a.m. and consider decreasing Lasix to a couple times per  week or discontinuing. - Stable for discharge from cellulitis standpoint.  Active Problems:   Seizure 1 on 4/8 - Patient had an episode of witnessed seizure (absence like) lasting approximately 45 seconds on 4/8 AM. Aborted spontaneously. - CT head without contrast: No acute findings.  - MRI brain results as below. Performed without contrast secondary to renal insufficiency. Discussed results with Dr. Julien Nordmann 4/10 who recommended starting dexamethasone. - EEG-negative for seizures. Detailed report as below - Loaded with a gram of IV Keppra and continue maintenance Keppra 500 mg every 12 hours. - Monitor for seizures-No further seizures - Patient counseled extensively that he should not drive or operate heavy machinery or be at heights or in standing water for at least 6 months and medical clearance from M.D. He verbalized understanding  - Radiation oncology consult and input appreciated-agree with steroids and Keppra. Patient's case will be reviewed in brain tumor conference next week and radiation oncology will arrange outpatient follow-up    Hypotension - Patient had  transient hypotension with systolic BPs in the 87O post seizure episode on 4/8. - resolved after IV fluids    CKD (chronic kidney disease), stage 3 - Creatinine at baseline. - Follow BMP periodically.    Antineoplastic chemotherapy induced pancytopenia - Last chemotherapy 3/29. - S/P 3 units PRBC transfusion since admission. - DC'ed heparin secondary to platelet count of 40 K on 4/6. 23 K on 4/7 - No reported bleeding. Platelet transfusion if active bleeding or platelet count <10 K - Brief mild left nostril epistaxis-resolved spontaneously and has not recurred in the last 48 hours. - Patient is day 5 of Granix for leukopenia/neutropenia. WBC has further dropped to 1.3 and ANC 1.1 on 4/12 - Status post 2 bags of platelets on 4/8  and 4/10. Platelets improved to 38 but decreasing >27 > 16. - Hemoglobin stable - Follow  CBCs in a.m. - His pancytopenia is taking a long time to stabilize.    Pericardial effusion without cardiac tamponade - 2-D echo shows small to moderate free-flowing pericardial effusion - Patient denies chest pain or dyspnea. Did report some dyspnea on admission which is probably multifactorial from lung mass, anemia. - No clinical features suggestive of cardiac tamponade. Monitor closely. - Cardiology consultation appreciated-no intervention needed at this time.    COPD (chronic obstructive pulmonary disease) - Stable      Primary cancer of right lower lobe of lung with brain metastasis - Oncology input appreciated. Apparently will not continue Alimta - Radiation oncology input appreciated     Tachycardia - Asymptomatic and mild - 2-D echo with normal EF. - Likely secondary to anemia and ongoing infection. - Stable and telemetry was discontinued.    Oral thrush - Continue nystatin    Protein-calorie malnutrition, severe - Management per dietitian.    Hypokalemia - Replaced     Dyspnea - Multifactorial as stated above - VQ scan: Low probability for PE  - Improved/resolved   Abnormal LFTs - Unclear etiology.  - Patient today stated that he never had any GI symptoms.  - RUQ ultrasound: No acute findings to explain elevated LFTs. - Lipase: 18 - As discussed with oncologist,? Related to Alimta. Improving - Temporarily hold statins  Code Status: Full Family Communication: Discussed with spouse on 4/8 Disposition Plan: Not medically stable for discharge in the context of severe pancytopenia.  Consultants:  Cardiology  Medical oncology  Procedures:   2 D Echo: Study Conclusions  - Left ventricle: The cavity size was normal. There was mild concentric hypertrophy. Systolic function was normal. The estimated ejection fraction was in the range of 55% to 60%. Wall motion was normal; there were no regional wall motion abnormalities. There was an increased  relative contribution of atrial contraction to ventricular filling. Doppler parameters are consistent with abnormal left ventricular relaxation (grade 1 diastolic dysfunction). - Aortic valve: Moderate diffuse thickening and calcification, consistent with sclerosis. - Aorta: Aortic root dimension: 44 mm (ED). - Aortic root: The aortic root was mildly dilated. - Mitral valve: Calcified annulus. - Pulmonic valve: There was trivial regurgitation. - Pericardium, extracardiac: A small to moderate, free-flowing pericardial effusion was identified along the left ventricular free wall and at the apex. The fluid had no internal echoes.   EEG 04/23/14: Impression: This awake EEG is normal except for excess amount of diffuse low voltage beta activity.  Clinical Correlation: Diffuse low voltage beta activity is commonly seen with sedating medications such as benzodiazepines. In the absence of sedating medications, anxiety and hyperthyroidism may produce generalized beta activity. The absence of epileptiform discharges does not exclude a clinical diagnosis of epilepsy. This EEG is slightly limited due to muscle artifact obscuring the temporal regions. If further clinical questions remain, repeat wake and sleep EEG may be helpful. Clinical correlation is advised.  Antibiotics:  IV Rocephin-DC'd 4/6  IV vancomycin 4/6 > 4/9   PO doxycycline 4/9 >  Subjective: Leg swelling continued to improve. No further nosebleeds. Denies any other complaints.  Objective: Filed Vitals:   04/26/14 0640 04/26/14 1412 04/26/14 2054 04/27/14 0451  BP:  114/74 116/72 117/71  Pulse:  100 102 99  Temp:  97.8 F (36.6 C) 98.1 F (36.7 C) 98.3 F (36.8 C)  TempSrc:  Oral Oral Oral  Resp:  20 18  20  Height:      Weight: 75.6 kg (166 lb 10.7 oz)   76.8 kg (169 lb 5 oz)  SpO2:  100% 99% 99%    Intake/Output Summary (Last 24 hours) at 04/27/14 1136 Last data filed at 04/27/14 1059  Gross per 24  hour  Intake    160 ml  Output   3000 ml  Net  -2840 ml   Filed Weights   04/25/14 1300 04/26/14 0640 04/27/14 0451  Weight: 78.2 kg (172 lb 6.4 oz) 75.6 kg (166 lb 10.7 oz) 76.8 kg (169 lb 5 oz)     Exam:  General exam: Pleasant elderly frail male lying comfortably supine in bed. Respiratory system: Clear. No increased work of breathing. Cardiovascular system: S1 & S2 heard, RRR. No JVD, murmurs, gallops, clicks or pedal edema.  Gastrointestinal system: Abdomen is nondistended, soft and nontender. Normal bowel sounds heard. Central nervous system: Alert and oriented. No focal neurological deficits. Extremities: Symmetric 5 x 5 power. Right leg appears mildly asymmetrically swollen compared to left. 1+ pitting bilateral leg edema right >left- much improved. Extensive petechiae bilateral legs and patchy minimal erythema which is decreasing. May have some degree of chronic redness of legs.  Data Reviewed: Basic Metabolic Panel:  Recent Labs Lab 04/22/14 0519 04/23/14 0525 04/24/14 0525 04/25/14 0355 04/26/14 0459  NA 139 138 134* 139 140  K 3.1* 3.6 3.2* 3.7 4.1  CL 107 107 104 106 106  CO2 23 23 22 23 23   GLUCOSE 132* 111* 120* 114* 146*  BUN 30* 29* 32* 31* 33*  CREATININE 1.64* 1.59* 1.52* 1.56* 1.61*  CALCIUM 8.1* 8.2* 8.1* 8.4 8.7   Liver Function Tests:  Recent Labs Lab 04/23/14 0525 04/24/14 0525 04/25/14 0355 04/26/14 0459  AST 329* 251* 249* 269*  ALT 209* 225* 240* 281*  ALKPHOS 187* 180* 202* 223*  BILITOT 2.8* 1.7* 2.4* 2.1*  PROT 5.2* 5.2* 5.3* 5.8*  ALBUMIN 2.1* 2.1* 2.1* 2.2*    Recent Labs Lab 04/23/14 0525  LIPASE 18   No results for input(s): AMMONIA in the last 168 hours. CBC:  Recent Labs Lab 04/23/14 0525  04/24/14 0525 04/25/14 0355 04/25/14 1400 04/26/14 0459 04/27/14 0448  WBC 1.6*  < > 1.5* 1.5* 1.5* 1.5* 1.3*  NEUTROABS 1.2*  --   --  1.0* 1.0* 1.4* 1.1*  HGB 8.9*  < > 8.7* 8.0* 7.5* 8.0* 7.5*  HCT 25.4*  < > 24.9*  23.6* 21.8* 22.8* 21.5*  MCV 90.1  < > 89.9 90.4 91.2 90.5 90.3  PLT 11*  < > 17* 7* 38* 27* 16*  < > = values in this interval not displayed. Cardiac Enzymes: No results for input(s): CKTOTAL, CKMB, CKMBINDEX, TROPONINI in the last 168 hours. BNP (last 3 results) No results for input(s): PROBNP in the last 8760 hours. CBG: No results for input(s): GLUCAP in the last 168 hours.  Recent Results (from the past 240 hour(s))  Culture, blood (routine x 2)     Status: None   Collection Time: 04/19/14 10:25 PM  Result Value Ref Range Status   Specimen Description BLOOD LEFT ANTECUBITAL  Final   Special Requests BOTTLES DRAWN AEROBIC AND ANAEROBIC 10CC  Final   Culture   Final    NO GROWTH 5 DAYS Note: Culture results may be compromised due to an excessive volume of blood received in culture bottles. Performed at [facility]    Report Status 04/26/2014 FINAL  Final  Culture, blood (routine  x 2)     Status: None   Collection Time: 04/19/14 10:30 PM  Result Value Ref Range Status   Specimen Description BLOOD RIGHT ANTECUBITAL  Final   Special Requests BOTTLES DRAWN AEROBIC AND ANAEROBIC 5CC  Final   Culture   Final    NO GROWTH 5 DAYS Performed at [facility]    Report Status 04/26/2014 FINAL  Final  MRSA PCR Screening     Status: None   Collection Time: 04/23/14  1:15 PM  Result Value Ref Range Status   MRSA by PCR NEGATIVE NEGATIVE Final    Comment:        The GeneXpert MRSA Assay (FDA approved for NASAL specimens only), is one component of a comprehensive MRSA colonization surveillance program. It is not intended to diagnose MRSA infection nor to guide or monitor treatment for MRSA infections.           Studies: No results found.      Scheduled Meds: . antiseptic oral rinse  7 mL Mouth Rinse q12n4p  . chlorhexidine  15 mL Mouth Rinse BID  . dexamethasone  4 mg Oral BID  . doxycycline  100 mg Oral BID  . feeding supplement (ENSURE ENLIVE)   237 mL Oral TID BM  . ferrous sulfate  325 mg Oral Q lunch  . folic acid  500 mcg Oral Daily  . furosemide  20 mg Oral Daily  . hydrocerin   Topical BID  . levETIRAcetam  500 mg Oral BID  . megestrol  400 mg Oral BID  . nystatin  5 mL Oral QID  . polyvinyl alcohol   Both Eyes Daily  . Tbo-filgastrim (GRANIX) SQ  300 mcg Subcutaneous q1800   Continuous Infusions:    Principal Problem:   Cellulitis of leg, right Active Problems:   CKD (chronic kidney disease)   Antineoplastic chemotherapy induced pancytopenia   Pericardial effusion without cardiac tamponade   COPD (chronic obstructive pulmonary disease)   Primary cancer of right lower lobe of lung   Tachycardia   Oral thrush   Protein-calorie malnutrition, severe   Convulsions/seizures    Time spent: 20 minutes.    Vernell Leep, MD, FACP, FHM. [facility] Pager [phone]  If 7PM-7AM, please contact night-coverage [email or web address] Password [facility] 04/27/2014, 11:36 AM    LOS: 8 days

## 2014-04-27 NOTE — Progress Notes (Signed)
CRITICAL VALUE ALERT  Critical value received:  WBC 1.3, Platelets 16  Date of notification:  04/27/14  Time of notification:  0610  Critical value read back:Yes.    Nurse who received alert:  Cindi Carbon RN  MD notified (1st page):  [facility]  Time of first page:  0615  MD notified (2nd page):  Time of second page:  Responding MD:    Time MD responded:

## 2014-04-27 NOTE — Evaluation (Signed)
Physical Therapy Evaluation Patient Details Name: [name] MRN: [id number] DOB: [birth year] Today's Date: 04/27/2014   History of Present Illness  81 year old male with history of metastatic non-small cell lung cancer, adenocarcinoma with brain metastasis, currently undergoing systemic maintenance chemotherapy with Alimta-completed 15th cycle on 3/29, COPD, admitted to [facility] on 04/19/14 with complaints of swelling and redness of RLE with Lower extremity venous dopplers negative for DVT and diagnosed with cellulitis R LE > L LE. Hospital course complicated by chemotherapy related severe pancytopenia requiring transfusions of PRBC and platelets and Granix shots and new onset seizure   Clinical Impression  Pt admitted with above diagnosis. Pt currently with functional limitations due to the deficits listed below (see PT Problem List).  Pt will benefit from skilled PT to increase their independence and safety with mobility to allow discharge to the venue listed below.   RN reports monitoring of blood work today and agreeable to PT evaluation.  Pt's mobility limited today due to bilateral calf pain with standing so only pivoted to recliner however appeared happy with getting OOB and into new positioning today.  Pt reports he plans to d/c home so will continue to see pt to progress mobility safely.     Follow Up Recommendations Home health PT;Supervision for mobility/OOB    Equipment Recommendations  None recommended by PT    Recommendations for Other Services       Precautions / Restrictions Precautions Precautions: Fall Restrictions Weight Bearing Restrictions: No      Mobility  Bed Mobility Overal bed mobility: Modified Independent             General bed mobility comments: HOB elevated  Transfers Overall transfer level: Needs assistance Equipment used: Rolling walker (2 wheeled) Transfers: Sit to/from [facility] Sit to Stand: Min assist Stand  pivot transfers: Min assist       General transfer comment: assist from lower surface, verbal cues for hand placement, pain posterior calfs with standing so pt preferred only pivot to recliner at this time (not yet ambulating)  Ambulation/Gait                Stairs            Wheelchair Mobility    Modified Rankin (Stroke Patients Only)       Balance Overall balance assessment: Needs assistance         Standing balance support: Bilateral upper extremity supported Standing balance-Leahy Scale: Fair                               Pertinent Vitals/Pain Pain Assessment: 0-10 (not rated) Pain Location: bil posterior calves with mobility Pain Descriptors / Indicators: Tender;Sore;Tightness Pain Intervention(s): Limited activity within patient's tolerance;Monitored during session;Repositioned    Home Living Family/patient expects to be discharged to:: Private residence Living Arrangements: Spouse/significant other   Type of Home: House Home Access: Stairs to enter Entrance Stairs-Rails: Right Entrance Stairs-Number of Steps: 7 Home Layout: One level Home Equipment: Environmental consultant - 2 wheels;Cane - single point      Prior Function Level of Independence: Independent with assistive device(s)         Comments: typically uses cane     Hand Dominance        Extremity/Trunk Assessment               Lower Extremity Assessment: Generalized weakness;RLE deficits/detail;LLE deficits/detail RLE Deficits / Details: redness and edema >  L LE however present bilaterally, grossly at least 3+/5 per functional observation LLE Deficits / Details: redness and edema lower leg, grossly at least 3+/5 per functional observation     Communication   Communication: No difficulties  Cognition Arousal/Alertness: Awake/alert Behavior During Therapy: WFL for tasks assessed/performed Overall Cognitive Status: Within Functional Limits for tasks assessed                       General Comments      Exercises        Assessment/Plan    PT Assessment Patient needs continued PT services  PT Diagnosis Difficulty walking;Generalized weakness   PT Problem List Decreased strength;Decreased activity tolerance;Decreased mobility;Decreased knowledge of use of DME  PT Treatment Interventions Gait training;DME instruction;Stair training;Patient/family education;Functional mobility training;Therapeutic activities;Therapeutic exercise   PT Goals (Current goals can be found in the Care Plan section) Acute Rehab PT Goals PT Goal Formulation: With patient Time For Goal Achievement: 05/11/14 Potential to Achieve Goals: Good    Frequency Min 3X/week   Barriers to discharge        Co-evaluation               End of Session Equipment Utilized During Treatment: Gait belt Activity Tolerance: Patient limited by pain Patient left: in chair;with call bell/phone within reach           Time: 1056-1107 PT Time Calculation (min) (ACUTE ONLY): 11 min   Charges:   PT Evaluation $Initial PT Evaluation Tier I: 1 Procedure     PT G Codes:        Mykaela Arena,KATHrine E 04/27/2014, 11:31 AM Carmelia Bake, PT, DPT 04/27/2014 Pager: [phone]

## 2014-04-28 DIAGNOSIS — E43 Unspecified severe protein-calorie malnutrition: Secondary | ICD-10-CM

## 2014-04-28 DIAGNOSIS — T451X5A Adverse effect of antineoplastic and immunosuppressive drugs, initial encounter: Secondary | ICD-10-CM

## 2014-04-28 DIAGNOSIS — R569 Unspecified convulsions: Secondary | ICD-10-CM

## 2014-04-28 LAB — COMPREHENSIVE METABOLIC PANEL
ALT: 233 U/L — ABNORMAL HIGH (ref 0–53)
AST: 160 U/L — ABNORMAL HIGH (ref 0–37)
Albumin: 2.2 g/dL — ABNORMAL LOW (ref 3.5–5.2)
Alkaline Phosphatase: 244 U/L — ABNORMAL HIGH (ref 39–117)
Anion gap: 10 (ref 5–15)
BILIRUBIN TOTAL: 1.9 mg/dL — AB (ref 0.3–1.2)
BUN: 55 mg/dL — ABNORMAL HIGH (ref 6–23)
CO2: 24 mmol/L (ref 19–32)
Calcium: 8.5 mg/dL (ref 8.4–10.5)
Chloride: 103 mmol/L (ref 96–112)
Creatinine, Ser: 1.58 mg/dL — ABNORMAL HIGH (ref 0.50–1.35)
GFR calc Af Amer: 47 mL/min — ABNORMAL LOW (ref >90)
GFR, EST NON AFRICAN AMERICAN: 40 mL/min — AB (ref >90)
Glucose, Bld: 124 mg/dL — ABNORMAL HIGH (ref 70–99)
POTASSIUM: 4.3 mmol/L (ref 3.5–5.1)
SODIUM: 137 mmol/L (ref 135–145)
Total Protein: 5.6 g/dL — ABNORMAL LOW (ref 6.0–8.3)

## 2014-04-28 LAB — CBC
HEMATOCRIT: 21.7 % — AB (ref 39.0–52.0)
HEMOGLOBIN: 7.5 g/dL — AB (ref 13.0–17.0)
MCH: 31.3 pg (ref 26.0–34.0)
MCHC: 34.6 g/dL (ref 30.0–36.0)
MCV: 90.4 fL (ref 78.0–100.0)
Platelets: 8 10*3/uL — CL (ref 150–400)
RBC: 2.4 MIL/uL — ABNORMAL LOW (ref 4.22–5.81)
RDW: 16.6 % — ABNORMAL HIGH (ref 11.5–15.5)
WBC: 1.5 10*3/uL — ABNORMAL LOW (ref 4.0–10.5)

## 2014-04-28 MED ORDER — SODIUM CHLORIDE 0.9 % IV SOLN: Freq: Once | INTRAVENOUS | Status: DC

## 2014-04-28 NOTE — Progress Notes (Signed)
OT Cancellation Note  Patient Details Name: [name] MRN: [id number] DOB: [birth year]   Cancelled Treatment:    Reason Eval/Treat Not Completed: Medical issues which prohibited therapy.  Pt with platelets of 8.  Will check back.    Gwen Edler 04/28/2014, 7:16 AM  Lesle Chris, OTR/L [phone] 04/28/2014

## 2014-04-28 NOTE — Progress Notes (Signed)
PROGRESS NOTE  [name] ZGY:[id number] DOB: 05-29-1933 DOA: 04/19/2014 PCP: [name], MD HPI/Brief narrative 81 year old male with history of metastatic non-small cell lung cancer, adenocarcinoma with brain metastasis status post posterior tactic radiotherapy to the brain lesions followed by palliative radiotherapy to the left lower lobe lung mass, currently undergoing systemic maintenance chemotherapy with Alimta-completed 15th cycle on 3/29, COPD, admitted to [facility] on 04/19/14 with complaints of swelling and redness of RLE. Lower extremity venous Dopplers negative for DVT. Admitted for cellulitis. Hospital course complicated by chemotherapy related severe pancytopenia requiring transfusions of PRBC and platelets and Granix shots and new onset seizure 1 for which he was transferred to stepdown unit on 4/8. DC home when blood counts stable Assessment/Plan:  Cellulitis of legs, R>L - Initially started empirically on IV ceftriaxone for cellulitis of RLE. - Bilateral lower extremity venous Dopplers negative for DVT. - Reassessment on 4/6 showed findings suspicious for cellulitis in both legs without significant improvement in right lower extremity as per patient and family. - Rocephin was discontinued and started IV vancomycin on 4/6. - Blood cultures 2: Negative. - Lower extremities have improved with decreased erythema, warmth and tenderness but still has significant edema. His lower extremity findings now suggestive more of petechia related to thrombocytopenia and pedal edema. Some of his erythema may be chronic. Started PO Lasix for leg edema. - DC vancomycin and started oral doxycycline 4/9.  - Completed 10 days of antibiotics on 4/13--d/c abx after today's doses - Leg edema improving after PO Lasix. -10 L since admission. Follow clinically and BMP in a.m. - Stable for discharge from cellulitis standpoint. -04/28/14--d/c lasix and monitor creatinine  Active  Problems:  Seizure 1 on 4/8 - Patient had an episode of witnessed seizure (absence like) lasting approximately 45 seconds on 4/8 AM. Aborted spontaneously. - CT head without contrast: No acute findings.  - MRI brain results as below. Performed without contrast secondary to renal insufficiency. Discussed results with Dr. Julien Nordmann 4/10 who recommended starting dexamethasone. - EEG-negative for seizures. Detailed report as below - Loaded with a gram of IV Keppra and continue maintenance Keppra 500 mg every 12 hours. - Monitor for seizures-No further seizures - Patient counseled extensively that he should not drive or operate heavy machinery or be at heights or in standing water for at least 6 months and medical clearance from M.D. He verbalized understanding  - Radiation oncology consult and input appreciated-agree with steroids and Keppra. Patient's case will be reviewed in brain tumor conference next week and radiation oncology will arrange outpatient follow-up   Hypotension - Patient had transient hypotension with systolic BPs in the 59F post seizure episode on 4/8. - resolved after IV fluids   CKD (chronic kidney disease), stage 3 - Creatinine at baseline. - Follow BMP periodically.   Antineoplastic chemotherapy induced pancytopenia - Last chemotherapy 3/29. - S/P 3 units PRBC transfusion since admission. - DC'ed heparin secondary to platelet count of 40 K on 4/6. 23 K on 4/7 - No reported bleeding. Platelet transfusion if active bleeding or platelet count <10 K - Brief mild left nostril epistaxis-resolved spontaneously and has not recurred in the last 72 hours. - Patient is day 6 of Granix for leukopenia/neutropenia. WBC has further dropped to 1.3 and ANC 1.1 on 4/12-->1.5 on 4/13 - Status post 2 bags of platelets on 4/8 and 4/10. Platelets improved to 38 but decreasing >27 > 16>>8 -04/28/14--transfuse additional 2 units platelets -  Hemoglobin stable - Follow CBCs in a.m. - His  pancytopenia is taking a long time to stabilize.   Pericardial effusion without cardiac tamponade - 2-D echo shows small to moderate free-flowing pericardial effusion - Patient denies chest pain or dyspnea. Did report some dyspnea on admission which is probably multifactorial from lung mass, anemia. - No clinical features suggestive of cardiac tamponade. Monitor closely. - Cardiology consultation appreciated-no intervention needed at this time.   COPD (chronic obstructive pulmonary disease) - Stable    Primary cancer of right lower lobe of lung with brain metastasis - Oncology input appreciated. Apparently will not continue Alimta - Radiation oncology input appreciated    Tachycardia - Asymptomatic and mild -due to acute medical illness - 2-D echo with normal EF. - Likely secondary to anemia and ongoing infection. - Stable and telemetry was discontinued.   Oral thrush - Continue nystatin   Protein-calorie malnutrition, severe - Management per dietitian.   Hypokalemia - Replaced    Dyspnea - Multifactorial as stated above - VQ scan: Low probability for PE  - Improved/resolved  Transaminasemia - Unclear etiology.  - Patient today stated that he never had any GI symptoms.  - RUQ ultrasound: No acute findings to explain elevated LFTs. - Lipase: 18 - As discussed with oncologist,? Related to Alimta. Improving - Temporarily hold statins   Family Communication:   Pt at beside Disposition Plan:   Home when medically stable      Procedures/Studies: Dg Chest 2 View  04/19/2014   CLINICAL DATA:  Shortness of breath, leg swelling, lung cancer, initial encounter.  EXAM: CHEST  2 VIEW  COMPARISON:  03/06/2014 and CT chest 03/04/2014.  FINDINGS: Trachea is midline. Heart size stable. Right IJ power port tip is in the high right atrium. Right perihilar opacification and architectural distortion appear grossly stable. Small right pleural effusion with volume loss at  the base of the right hemi thorax. Reticular coarsening in the periphery of the left hemi thorax. No left pleural fluid.  IMPRESSION: 1. Right perihilar masslike opacification with architectural distortion, unchanged from 03/06/2014 and better interrogated on cross-sectional imaging 03/04/2014. 2. Small right pleural effusion, possibly increased. 3. Subpleural reticulation, indicative of fibrosis.   Electronically Signed   By: Lorin Picket M.D.   On: 04/19/2014 16:10   Ct Head Wo Contrast  04/23/2014   CLINICAL DATA:  New seizure activity  EXAM: CT HEAD WITHOUT CONTRAST  TECHNIQUE: Contiguous axial images were obtained from the base of the skull through the vertex without intravenous contrast.  COMPARISON:  None.  FINDINGS: Bony calvarium is intact. Mild atrophic changes are noted. Basal ganglia calcifications are seen. No findings to suggest acute hemorrhage, acute infarction or space-occupying mass lesion are noted.  IMPRESSION: Chronic atrophic changes without acute abnormality.   Electronically Signed   By: Inez Catalina M.D.   On: 04/23/2014 13:11   Mr Brain Wo Contrast  04/24/2014   CLINICAL DATA:  81 year old male with history of metastatic non small cell lung cancer with brain metastases treated with stereotactic radiation therapy. Currently undergoing systemic maintenance chemotherapy. Weakness seizure 04/23/2014. Subsequent encounter.  EXAM: MRI HEAD WITHOUT CONTRAST  TECHNIQUE: Multiplanar, multiecho pulse sequences of the brain and surrounding structures were obtained without intravenous contrast.  COMPARISON:  04/23/2014 head CT.  12/16/2013 brain MR.  FINDINGS: On the 12/16/2013 contrast-enhanced brain MR, patient was noted to have 3 small intracranial metastatic lesions (right paracentral inferior vermis, medial left occipital lobe and posterior right frontal lobe). Evaluation of  these intracranial metastatic lesions is limited on the present noncontrast motion degraded exam.  Mild vasogenic  edema right paracentral/ superior vermis slightly more notable than on the prior MR may reflect result of underlying tumor and/or treatment of tumor.  Minimal edema at the level of the previously noted small medial left occipital lobe may reflect result of underlying tumor and/or treatment of tumor.  No area of restricted motion to suggest intracranial metastatic disease or acute infarct.  Mild to moderate nonspecific white matter type changes may reflect result of small vessel disease and/or treatment tumor.  Global atrophy without hydrocephalus.  Major intracranial vascular structures are patent.  Cervical medullary junction, pituitary region, pineal region and orbital structures unremarkable.  Minimal to mild paranasal sinus mucosal thickening.  No evidence of mesial temporal sclerosis.  IMPRESSION: On the 12/16/2013 contrast-enhanced brain MR, patient was noted to have 3 small intracranial metastatic lesions (right paracentral inferior vermis, medial left occipital lobe and posterior right frontal lobe). Evaluation of these intracranial metastatic lesions is limited on the present noncontrast motion degraded exam.  Mild vasogenic edema right paracentral/ superior vermis slightly more notable than on the prior MR may reflect result of underlying tumor and/or treatment of tumor.  Minimal edema at the level of the previously noted small medial left occipital lobe may reflect result of underlying tumor and/or treatment of tumor.  No area of restricted motion to suggest intracranial metastatic disease or acute infarct.  Mild to moderate nonspecific white matter type changes may reflect result of small vessel disease and/or treatment tumor.  Global atrophy without hydrocephalus.   Electronically Signed   By: Genia Del M.D.   On: 04/24/2014 12:25   Nm Pulmonary Perf And Vent  04/19/2014   CLINICAL DATA:  Shortness of breath.  EXAM: NUCLEAR MEDICINE VENTILATION - PERFUSION LUNG SCAN  TECHNIQUE: Ventilation images  were obtained in multiple projections using inhaled aerosol technetium 99 M DTPA. Perfusion images were obtained in multiple projections after intravenous injection of Tc-65m MAA.  RADIOPHARMACEUTICALS:  40.0 MCi Tc-70m DTPA aerosol and 5.0 mCi Tc-53m MAA  COMPARISON:  Chest x-ray 04/19/2014 and CT chest 03/04/2014  FINDINGS: Ventilation: Patchy heterogeneous ventilation study with some linear defects likely corresponding to pleural effusions and lung disease.  Perfusion: Patchy heterogeneous perfusion scan matching the ventilation scan. No mismatching segmental or subsegmental defects to suggest pulmonary embolism.  IMPRESSION: Low probability ventilation perfusion lung scan for pulmonary embolism.   Electronically Signed   By: Marijo Sanes M.D.   On: 04/19/2014 19:10   US Abdomen Limited Ruq  04/23/2014   CLINICAL DATA:  Abnormal LFTs.  EXAM: US ABDOMEN LIMITED - RIGHT UPPER QUADRANT  COMPARISON:  None.  FINDINGS: Gallbladder:  Previous cholecystectomy.  Common bile duct:  Diameter: 2 mm  Liver:  No focal lesion identified. Within normal limits in parenchymal echogenicity.  Other:  Right pleural effusion noted.  IMPRESSION: 1. No findings to explain patient's elevated LFTs. 2. Prior cholecystectomy 3. Right pleural effusion   Electronically Signed   By: Kerby Moors M.D.   On: 04/23/2014 16:29        Subjective: Patient denies fevers, chills, headache, chest pain, dyspnea, nausea, vomiting, diarrhea, abdominal pain, dysuria, hematuria   Objective: Filed Vitals:   04/27/14 0451 04/27/14 1307 04/27/14 2130 04/28/14 0518  BP: 117/71 105/77 118/78 119/77  Pulse: 99 104 117 103  Temp: 98.3 F (36.8 C) 97.7 F (36.5 C) 98.4 F (36.9 C) 98.1 F (36.7 C)  TempSrc: Oral Oral  Oral Oral  Resp: 20 18 20 18   Height:      Weight: 76.8 kg (169 lb 5 oz)   74.9 kg (165 lb 2 oz)  SpO2: 99% 98% 100% 99%    Intake/Output Summary (Last 24 hours) at 04/28/14 1154 Last data filed at 04/28/14 0931   Gross per 24 hour  Intake    490 ml  Output   2875 ml  Net  -2385 ml   Weight change: -1.9 kg (-4 lb 3 oz) Exam:   General:  Pt is alert, follows commands appropriately, not in acute distress  HEENT: No icterus, No thrush, [place]/AT  Cardiovascular: RRR, S1/S2, no rubs, no gallops  Respiratory:L-crackles, R-diminished BS, no wheeze  Abdomen: Soft/+BS, non tender, non distended, no guarding  Extremities: 1+LE edema, No lymphangitis, No petechiae, no synovitis--petechial rash b/l LE without blancing  Data Reviewed: Basic Metabolic Panel:  Recent Labs Lab 04/23/14 0525 04/24/14 0525 04/25/14 0355 04/26/14 0459 04/28/14 0430  NA 138 134* 139 140 137  K 3.6 3.2* 3.7 4.1 4.3  CL 107 104 106 106 103  CO2 23 22 23 23 24   GLUCOSE 111* 120* 114* 146* 124*  BUN 29* 32* 31* 33* 55*  CREATININE 1.59* 1.52* 1.56* 1.61* 1.58*  CALCIUM 8.2* 8.1* 8.4 8.7 8.5   Liver Function Tests:  Recent Labs Lab 04/23/14 0525 04/24/14 0525 04/25/14 0355 04/26/14 0459 04/28/14 0430  AST 329* 251* 249* 269* 160*  ALT 209* 225* 240* 281* 233*  ALKPHOS 187* 180* 202* 223* 244*  BILITOT 2.8* 1.7* 2.4* 2.1* 1.9*  PROT 5.2* 5.2* 5.3* 5.8* 5.6*  ALBUMIN 2.1* 2.1* 2.1* 2.2* 2.2*    Recent Labs Lab 04/23/14 0525  LIPASE 18   No results for input(s): AMMONIA in the last 168 hours. CBC:  Recent Labs Lab 04/23/14 0525  04/25/14 0355 04/25/14 1400 04/26/14 0459 04/27/14 0448 04/28/14 0430  WBC 1.6*  < > 1.5* 1.5* 1.5* 1.3* 1.5*  NEUTROABS 1.2*  --  1.0* 1.0* 1.4* 1.1*  --   HGB 8.9*  < > 8.0* 7.5* 8.0* 7.5* 7.5*  HCT 25.4*  < > 23.6* 21.8* 22.8* 21.5* 21.7*  MCV 90.1  < > 90.4 91.2 90.5 90.3 90.4  PLT 11*  < > 7* 38* 27* 16* 8*  < > = values in this interval not displayed. Cardiac Enzymes: No results for input(s): CKTOTAL, CKMB, CKMBINDEX, TROPONINI in the last 168 hours. BNP: Invalid input(s): POCBNP CBG: No results for input(s): GLUCAP in the last 168 hours.  Recent Results  (from the past 240 hour(s))  Culture, blood (routine x 2)     Status: None   Collection Time: 04/19/14 10:25 PM  Result Value Ref Range Status   Specimen Description BLOOD LEFT ANTECUBITAL  Final   Special Requests BOTTLES DRAWN AEROBIC AND ANAEROBIC 10CC  Final   Culture   Final    NO GROWTH 5 DAYS Note: Culture results may be compromised due to an excessive volume of blood received in culture bottles. Performed at [facility]    Report Status 04/26/2014 FINAL  Final  Culture, blood (routine x 2)     Status: None   Collection Time: 04/19/14 10:30 PM  Result Value Ref Range Status   Specimen Description BLOOD RIGHT ANTECUBITAL  Final   Special Requests BOTTLES DRAWN AEROBIC AND ANAEROBIC 5CC  Final   Culture   Final    NO GROWTH 5 DAYS Performed at [facility]    Report  Status 04/26/2014 FINAL  Final  MRSA PCR Screening     Status: None   Collection Time: 04/23/14  1:15 PM  Result Value Ref Range Status   MRSA by PCR NEGATIVE NEGATIVE Final    Comment:        The GeneXpert MRSA Assay (FDA approved for NASAL specimens only), is one component of a comprehensive MRSA colonization surveillance program. It is not intended to diagnose MRSA infection nor to guide or monitor treatment for MRSA infections.      Scheduled Meds: . sodium chloride   Intravenous Once  . sodium chloride   Intravenous Once  . antiseptic oral rinse  7 mL Mouth Rinse q12n4p  . chlorhexidine  15 mL Mouth Rinse BID  . dexamethasone  4 mg Oral BID  . doxycycline  100 mg Oral BID  . feeding supplement (ENSURE ENLIVE)  237 mL Oral TID BM  . ferrous sulfate  325 mg Oral Q lunch  . folic acid  416 mcg Oral Daily  . hydrocerin   Topical BID  . levETIRAcetam  500 mg Oral BID  . megestrol  400 mg Oral BID  . nystatin  5 mL Oral QID  . polyvinyl alcohol   Both Eyes Daily  . Tbo-filgastrim (GRANIX) SQ  300 mcg Subcutaneous q1800   Continuous Infusions:    Deserae Jennings, DO  [facility] Pager [phone]  If 7PM-7AM, please contact night-coverage [email or web address] Password [email or web address] 04/28/2014, 11:54 AM   LOS: 9 days

## 2014-04-28 NOTE — Progress Notes (Signed)
PT Cancellation Note  Patient Details Name: [name] MRN: [id number] DOB: [birth year]   Cancelled Treatment:     currently receiving platelet transfusion   Nathanial Rancher 04/28/2014, 3:18 PM

## 2014-04-28 NOTE — Progress Notes (Signed)
CRITICAL VALUE ALERT  Critical value received:  Platelets 8  Date of notification:  04/27/13  Time of notification:  8478  Critical value read back:Yes.    Nurse who received alert:  Cindi Carbon  MD notified (1st page):  TRH1  Time of first page:  0600  MD notified (2nd page):  Time of second page:  Responding MD:    Time MD responded:

## 2014-04-29 DIAGNOSIS — L03115 Cellulitis of right lower limb: Secondary | ICD-10-CM | POA: Insufficient documentation

## 2014-04-29 LAB — CBC
HEMATOCRIT: 20.3 % — AB (ref 39.0–52.0)
Hemoglobin: 7.1 g/dL — ABNORMAL LOW (ref 13.0–17.0)
MCH: 31.4 pg (ref 26.0–34.0)
MCHC: 35 g/dL (ref 30.0–36.0)
MCV: 89.8 fL (ref 78.0–100.0)
Platelets: 36 10*3/uL — ABNORMAL LOW (ref 150–400)
RBC: 2.26 MIL/uL — ABNORMAL LOW (ref 4.22–5.81)
RDW: 16.5 % — ABNORMAL HIGH (ref 11.5–15.5)
WBC: 1.6 10*3/uL — AB (ref 4.0–10.5)

## 2014-04-29 LAB — BASIC METABOLIC PANEL
Anion gap: 8 (ref 5–15)
BUN: 60 mg/dL — AB (ref 6–23)
CO2: 25 mmol/L (ref 19–32)
CREATININE: 1.6 mg/dL — AB (ref 0.50–1.35)
Calcium: 8.7 mg/dL (ref 8.4–10.5)
Chloride: 103 mmol/L (ref 96–112)
GFR calc Af Amer: 46 mL/min — ABNORMAL LOW (ref >90)
GFR, EST NON AFRICAN AMERICAN: 40 mL/min — AB (ref >90)
Glucose, Bld: 122 mg/dL — ABNORMAL HIGH (ref 70–99)
Potassium: 4.4 mmol/L (ref 3.5–5.1)
Sodium: 136 mmol/L (ref 135–145)

## 2014-04-29 LAB — PREPARE PLATELET PHERESIS
Unit division: 0
Unit division: 0

## 2014-04-29 LAB — PREPARE RBC (CROSSMATCH)

## 2014-04-29 MED ORDER — SODIUM CHLORIDE 0.9 % IV SOLN: Freq: Once | INTRAVENOUS | Status: DC

## 2014-04-29 NOTE — Progress Notes (Signed)
OT Cancellation Note  Patient Details Name: [name] MRN: [id number] DOB: [birth year]   Cancelled Treatment:    Reason Eval/Treat Not Completed: Other (comment) Spoke to nursing who requests OT to hold off right now. Pt with Hgb 7.1 and supposed to be receiving blood.  [place], [place] 04/29/2014, 9:24 AM

## 2014-04-29 NOTE — Progress Notes (Signed)
PROGRESS NOTE  [name] OHY:[id number] DOB: 1933-08-19 DOA: 04/19/2014 PCP: [name], MD  HPI/Brief narrative 81 year old male with history of metastatic non-small cell lung cancer, adenocarcinoma with brain metastasis status post posterior tactic radiotherapy to the brain lesions followed by palliative radiotherapy to the left lower lobe lung mass, currently undergoing systemic maintenance chemotherapy with Alimta-completed 15th cycle on 3/29, COPD, admitted to [facility] on 04/19/14 with complaints of swelling and redness of RLE. Lower extremity venous Dopplers negative for DVT. Admitted for cellulitis. Hospital course complicated by chemotherapy related severe pancytopenia requiring transfusions of PRBC and platelets and Granix shots and new onset seizure 1 for which he was transferred to stepdown unit on 4/8. He was subsequently transferred back to [facility]. Assessment/Plan:   Cellulitis of legs, R>L - Initially started empirically on IV ceftriaxone for cellulitis of RLE. - Bilateral lower extremity venous Dopplers negative for DVT. - Reassessment on 4/6 showed findings suspicious for cellulitis in both legs without significant improvement in right lower extremity as per patient and family. - Rocephin was discontinued and started IV vancomycin on 4/6. - Blood cultures 2: Negative. - Lower extremities have improved with decreased erythema, warmth and tenderness but still has significant edema. His lower extremity findings now suggestive more of petechia related to thrombocytopenia and pedal edema. Some of his erythema may be chronic. Started PO Lasix for leg edema. - DC vancomycin and started oral doxycycline 4/9.  - Completed 10 days of antibiotics on 4/14 - Leg edema improving after PO Lasix. -14 L since admission. Follow clinically and BMP in a.m. - Stable for discharge from cellulitis standpoint. -04/28/14--d/c lasix and monitor creatinine    Seizure 1 on  4/8 - Patient had an episode of witnessed seizure (absence like) lasting approximately 45 seconds on 4/8 AM. Aborted spontaneously. - CT head without contrast: No acute findings.  - MRI brain results as below. Performed without contrast secondary to renal insufficiency. Discussed results with Dr. Julien Nordmann 4/10 who recommended starting dexamethasone. - EEG-negative for seizures. Detailed report as below - Loaded with a gram of IV Keppra and continue maintenance Keppra 500 mg every 12 hours. - Monitor for seizures-No further seizures - Patient counseled extensively that he should not drive or operate heavy machinery or be at heights or in standing water for at least 6 months and medical clearance from M.D. He verbalized understanding  - Radiation oncology consult and input appreciated-agree with steroids and Keppra. Patient's case will be reviewed in brain tumor conference next week and radiation oncology will arrange outpatient follow-up   Hypotension - Patient had transient hypotension with systolic BPs in the 94W post seizure episode on 4/8. - resolved after IV fluids   CKD (chronic kidney disease), stage 3 - Creatinine at baseline. - Follow BMP periodically.   Antineoplastic chemotherapy induced pancytopenia - Last chemotherapy 3/29. - S/P 4 units PRBC transfusion since admission. - DC'ed heparin secondary to platelet count of 40 K on 4/6. 23 K on 4/7 - No reported bleeding. Platelet transfusion if active bleeding or platelet count <10 K - Brief mild left nostril epistaxis-resolved spontaneously and has not recurred in the last 72 hours. - Patient had 6 dof Granix for leukopenia/neutropenia. WBC has further dropped to 1.3 and ANC 1.1 on 4/12-->1.5 on 4/13 (last day of Granix) - Status post 2 bags of platelets on 4/8 and 4/10. Platelets improved to 38 but decreasing >27 > 16>>8 -04/28/14--transfuse additional 2 units  platelets (5 units for the admission) - 04/29/14--transfuse 1  additional units PRBCs - Follow CBCs in a.m. - His pancytopenia is taking a long time to stabilize.   Pericardial effusion without cardiac tamponade - 2-D echo shows small to moderate free-flowing pericardial effusion - Patient denies chest pain or dyspnea. Did report some dyspnea on admission which is probably multifactorial from lung mass, anemia. - No clinical features suggestive of cardiac tamponade. Monitor closely. - Cardiology consultation appreciated-no intervention needed at this time.   COPD (chronic obstructive pulmonary disease) - Stable    Primary cancer of right lower lobe of lung with brain metastasis - Oncology input appreciated. Apparently will not continue Alimta - Radiation oncology input appreciated    Tachycardia - Asymptomatic and mild -due to acute medical illness - 2-D echo with normal EF. - Likely secondary to anemia and ongoing infection. - Stable and telemetry was discontinued. -overall continues to improve   Oral thrush - Continue nystatin   Protein-calorie malnutrition, severe - Management per dietitian.   Hypokalemia - Replaced    Dyspnea - Multifactorial as stated above - VQ scan: Low probability for PE  - Improved/resolved  Transaminasemia - Unclear etiology.  - Patient today stated that he never had any GI symptoms.  - RUQ ultrasound: No acute findings to explain elevated LFTs. - Lipase: 18 - As discussed with oncologist,? Related to Alimta. Improving - Temporarily hold statins   Family Communication: Pt at beside Disposition Plan: Home when medically stable    Procedures/Studies: Dg Chest 2 View  04/19/2014   CLINICAL DATA:  Shortness of breath, leg swelling, lung cancer, initial encounter.  EXAM: CHEST  2 VIEW  COMPARISON:  03/06/2014 and CT chest 03/04/2014.  FINDINGS: Trachea is midline. Heart size stable. Right IJ power port tip is in the high right atrium. Right perihilar opacification and architectural  distortion appear grossly stable. Small right pleural effusion with volume loss at the base of the right hemi thorax. Reticular coarsening in the periphery of the left hemi thorax. No left pleural fluid.  IMPRESSION: 1. Right perihilar masslike opacification with architectural distortion, unchanged from 03/06/2014 and better interrogated on cross-sectional imaging 03/04/2014. 2. Small right pleural effusion, possibly increased. 3. Subpleural reticulation, indicative of fibrosis.   Electronically Signed   By: Lorin Picket M.D.   On: 04/19/2014 16:10   Ct Head Wo Contrast  04/23/2014   CLINICAL DATA:  New seizure activity  EXAM: CT HEAD WITHOUT CONTRAST  TECHNIQUE: Contiguous axial images were obtained from the base of the skull through the vertex without intravenous contrast.  COMPARISON:  None.  FINDINGS: Bony calvarium is intact. Mild atrophic changes are noted. Basal ganglia calcifications are seen. No findings to suggest acute hemorrhage, acute infarction or space-occupying mass lesion are noted.  IMPRESSION: Chronic atrophic changes without acute abnormality.   Electronically Signed   By: Inez Catalina M.D.   On: 04/23/2014 13:11   Mr Brain Wo Contrast  04/24/2014   CLINICAL DATA:  81 year old male with history of metastatic non small cell lung cancer with brain metastases treated with stereotactic radiation therapy. Currently undergoing systemic maintenance chemotherapy. Weakness seizure 04/23/2014. Subsequent encounter.  EXAM: MRI HEAD WITHOUT CONTRAST  TECHNIQUE: Multiplanar, multiecho pulse sequences of the brain and surrounding structures were obtained without intravenous contrast.  COMPARISON:  04/23/2014 head CT.  12/16/2013 brain MR.  FINDINGS: On the 12/16/2013 contrast-enhanced brain MR, patient was noted to have 3 small intracranial metastatic lesions (right paracentral inferior vermis, medial left occipital  lobe and posterior right frontal lobe). Evaluation of these intracranial metastatic  lesions is limited on the present noncontrast motion degraded exam.  Mild vasogenic edema right paracentral/ superior vermis slightly more notable than on the prior MR may reflect result of underlying tumor and/or treatment of tumor.  Minimal edema at the level of the previously noted small medial left occipital lobe may reflect result of underlying tumor and/or treatment of tumor.  No area of restricted motion to suggest intracranial metastatic disease or acute infarct.  Mild to moderate nonspecific white matter type changes may reflect result of small vessel disease and/or treatment tumor.  Global atrophy without hydrocephalus.  Major intracranial vascular structures are patent.  Cervical medullary junction, pituitary region, pineal region and orbital structures unremarkable.  Minimal to mild paranasal sinus mucosal thickening.  No evidence of mesial temporal sclerosis.  IMPRESSION: On the 12/16/2013 contrast-enhanced brain MR, patient was noted to have 3 small intracranial metastatic lesions (right paracentral inferior vermis, medial left occipital lobe and posterior right frontal lobe). Evaluation of these intracranial metastatic lesions is limited on the present noncontrast motion degraded exam.  Mild vasogenic edema right paracentral/ superior vermis slightly more notable than on the prior MR may reflect result of underlying tumor and/or treatment of tumor.  Minimal edema at the level of the previously noted small medial left occipital lobe may reflect result of underlying tumor and/or treatment of tumor.  No area of restricted motion to suggest intracranial metastatic disease or acute infarct.  Mild to moderate nonspecific white matter type changes may reflect result of small vessel disease and/or treatment tumor.  Global atrophy without hydrocephalus.   Electronically Signed   By: Genia Del M.D.   On: 04/24/2014 12:25   Nm Pulmonary Perf And Vent  04/19/2014   CLINICAL DATA:  Shortness of breath.   EXAM: NUCLEAR MEDICINE VENTILATION - PERFUSION LUNG SCAN  TECHNIQUE: Ventilation images were obtained in multiple projections using inhaled aerosol technetium 99 M DTPA. Perfusion images were obtained in multiple projections after intravenous injection of Tc-81m MAA.  RADIOPHARMACEUTICALS:  40.0 MCi Tc-55m DTPA aerosol and 5.0 mCi Tc-65m MAA  COMPARISON:  Chest x-ray 04/19/2014 and CT chest 03/04/2014  FINDINGS: Ventilation: Patchy heterogeneous ventilation study with some linear defects likely corresponding to pleural effusions and lung disease.  Perfusion: Patchy heterogeneous perfusion scan matching the ventilation scan. No mismatching segmental or subsegmental defects to suggest pulmonary embolism.  IMPRESSION: Low probability ventilation perfusion lung scan for pulmonary embolism.   Electronically Signed   By: Marijo Sanes M.D.   On: 04/19/2014 19:10   US Abdomen Limited Ruq  04/23/2014   CLINICAL DATA:  Abnormal LFTs.  EXAM: US ABDOMEN LIMITED - RIGHT UPPER QUADRANT  COMPARISON:  None.  FINDINGS: Gallbladder:  Previous cholecystectomy.  Common bile duct:  Diameter: 2 mm  Liver:  No focal lesion identified. Within normal limits in parenchymal echogenicity.  Other:  Right pleural effusion noted.  IMPRESSION: 1. No findings to explain patient's elevated LFTs. 2. Prior cholecystectomy 3. Right pleural effusion   Electronically Signed   By: Kerby Moors M.D.   On: 04/23/2014 16:29         Subjective: Patient denies fevers, chills, headache, chest pain, dyspnea, nausea, vomiting, diarrhea, abdominal pain, dysuria, hematuria   Objective: Filed Vitals:   04/29/14 1514 04/29/14 1540 04/29/14 1625 04/29/14 1800  BP: 115/71 110/71 122/85 129/83  Pulse: 103 106 102 97  Temp: 98 F (36.7 C) 98.2 F (36.8 C) 98.5 F (36.9  C) 98.4 F (36.9 C)  TempSrc: Oral Oral Oral Oral  Resp: 18 18 18 18   Height:      Weight:      SpO2: 97% 100% 99% 99%    Intake/Output Summary (Last 24 hours) at 04/29/14  1839 Last data filed at 04/29/14 1525  Gross per 24 hour  Intake   1292 ml  Output   3030 ml  Net  -1738 ml   Weight change: -1.4 kg (-3 lb 1.4 oz) Exam:   General:  Pt is alert, follows commands appropriately, not in acute distress  HEENT: No icterus, No thrush, [place]/AT  Cardiovascular: RRR, S1/S2, no rubs, no gallops  Respiratory: diminished breath sounds at the bases. No wheezing. Good air movement.  Abdomen: Soft/+BS, non tender, non distended, no guarding  Extremities: 1+LE edema,no synovitis; scattered petechiae on the bilateral lower extremities with nonblanching areas of erythema.  Data Reviewed: Basic Metabolic Panel:  Recent Labs Lab 04/24/14 0525 04/25/14 0355 04/26/14 0459 04/28/14 0430 04/29/14 0515  NA 134* 139 140 137 136  K 3.2* 3.7 4.1 4.3 4.4  CL 104 106 106 103 103  CO2 22 23 23 24 25   GLUCOSE 120* 114* 146* 124* 122*  BUN 32* 31* 33* 55* 60*  CREATININE 1.52* 1.56* 1.61* 1.58* 1.60*  CALCIUM 8.1* 8.4 8.7 8.5 8.7   Liver Function Tests:  Recent Labs Lab 04/23/14 0525 04/24/14 0525 04/25/14 0355 04/26/14 0459 04/28/14 0430  AST 329* 251* 249* 269* 160*  ALT 209* 225* 240* 281* 233*  ALKPHOS 187* 180* 202* 223* 244*  BILITOT 2.8* 1.7* 2.4* 2.1* 1.9*  PROT 5.2* 5.2* 5.3* 5.8* 5.6*  ALBUMIN 2.1* 2.1* 2.1* 2.2* 2.2*    Recent Labs Lab 04/23/14 0525  LIPASE 18   No results for input(s): AMMONIA in the last 168 hours. CBC:  Recent Labs Lab 04/23/14 0525  04/25/14 0355 04/25/14 1400 04/26/14 0459 04/27/14 0448 04/28/14 0430 04/29/14 0515  WBC 1.6*  < > 1.5* 1.5* 1.5* 1.3* 1.5* 1.6*  NEUTROABS 1.2*  --  1.0* 1.0* 1.4* 1.1*  --   --   HGB 8.9*  < > 8.0* 7.5* 8.0* 7.5* 7.5* 7.1*  HCT 25.4*  < > 23.6* 21.8* 22.8* 21.5* 21.7* 20.3*  MCV 90.1  < > 90.4 91.2 90.5 90.3 90.4 89.8  PLT 11*  < > 7* 38* 27* 16* 8* 36*  < > = values in this interval not displayed. Cardiac Enzymes: No results for input(s): CKTOTAL, CKMB, CKMBINDEX,  TROPONINI in the last 168 hours. BNP: Invalid input(s): POCBNP CBG: No results for input(s): GLUCAP in the last 168 hours.  Recent Results (from the past 240 hour(s))  Culture, blood (routine x 2)     Status: None   Collection Time: 04/19/14 10:25 PM  Result Value Ref Range Status   Specimen Description BLOOD LEFT ANTECUBITAL  Final   Special Requests BOTTLES DRAWN AEROBIC AND ANAEROBIC 10CC  Final   Culture   Final    NO GROWTH 5 DAYS Note: Culture results may be compromised due to an excessive volume of blood received in culture bottles. Performed at [facility]    Report Status 04/26/2014 FINAL  Final  Culture, blood (routine x 2)     Status: None   Collection Time: 04/19/14 10:30 PM  Result Value Ref Range Status   Specimen Description BLOOD RIGHT ANTECUBITAL  Final   Special Requests BOTTLES DRAWN AEROBIC AND ANAEROBIC 5CC  Final   Culture  Final    NO GROWTH 5 DAYS Performed at [facility]    Report Status 04/26/2014 FINAL  Final  MRSA PCR Screening     Status: None   Collection Time: 04/23/14  1:15 PM  Result Value Ref Range Status   MRSA by PCR NEGATIVE NEGATIVE Final    Comment:        The GeneXpert MRSA Assay (FDA approved for NASAL specimens only), is one component of a comprehensive MRSA colonization surveillance program. It is not intended to diagnose MRSA infection nor to guide or monitor treatment for MRSA infections.      Scheduled Meds: . sodium chloride   Intravenous Once  . sodium chloride   Intravenous Once  . sodium chloride   Intravenous Once  . antiseptic oral rinse  7 mL Mouth Rinse q12n4p  . chlorhexidine  15 mL Mouth Rinse BID  . dexamethasone  4 mg Oral BID  . feeding supplement (ENSURE ENLIVE)  237 mL Oral TID BM  . ferrous sulfate  325 mg Oral Q lunch  . folic acid  975 mcg Oral Daily  . hydrocerin   Topical BID  . levETIRAcetam  500 mg Oral BID  . megestrol  400 mg Oral BID  . nystatin  5 mL Oral QID  .  polyvinyl alcohol   Both Eyes Daily   Continuous Infusions:    Sharalee Witman, DO  [facility] Pager [phone]  If 7PM-7AM, please contact night-coverage [email or web address] Password [facility] 04/29/2014, 6:39 PM   LOS: 10 days

## 2014-04-29 NOTE — Progress Notes (Signed)
CARE MANAGEMENT NOTE 04/29/2014  Patient:  [name], [name]   Account Number:  [id number]  Date Initiated:  04/22/2014  Documentation initiated by:  Edwyna Shell  Subjective/Objective Assessment:   81 yo male admitted with cellulitis of bilateral legs, left greater than right from home with spouse     Action/Plan:   discharge planning   Anticipated DC Date:  04/24/2014   Anticipated DC Plan:  [place]  CM consult      Choice offered to / List presented to:             Status of service:  Completed, signed off Medicare Important Message given?  YES (If response is "NO", the following Medicare IM given date fields will be blank) Date Medicare IM given:  04/22/2014 Medicare IM given by:  Edwyna Shell Date Additional Medicare IM given:   Additional Medicare IM given by:    Discharge Disposition:  [place]  Per UR Regulation:    If discussed at Long Length of Stay Meetings, dates discussed:    Comments:  04/29/14 Edwyna Shell RN BSN CM [phone] Patient and spouse stated that they have a walker and a shower chair in the home and did not feel that they needed any other DME. Provided the patient with a [facility] agency list, pending choice.  04/22/14 Edwyna Shell RN BSN CM [phone] Patient stated that he recently started using a cane but has no other DME, has PCP, pharmacy, and lives independently with his spouse.

## 2014-04-30 ENCOUNTER — Telehealth: Payer: Self-pay | Admitting: *Deleted

## 2014-04-30 ENCOUNTER — Telehealth: Payer: Self-pay | Admitting: Medical Oncology

## 2014-04-30 DIAGNOSIS — R7989 Other specified abnormal findings of blood chemistry: Secondary | ICD-10-CM | POA: Insufficient documentation

## 2014-04-30 DIAGNOSIS — R945 Abnormal results of liver function studies: Secondary | ICD-10-CM | POA: Insufficient documentation

## 2014-04-30 LAB — CBC
HCT: 25.6 % — ABNORMAL LOW (ref 39.0–52.0)
HEMOGLOBIN: 8.7 g/dL — AB (ref 13.0–17.0)
MCH: 30.3 pg (ref 26.0–34.0)
MCHC: 34 g/dL (ref 30.0–36.0)
MCV: 89.2 fL (ref 78.0–100.0)
Platelets: 23 10*3/uL — CL (ref 150–400)
RBC: 2.87 MIL/uL — AB (ref 4.22–5.81)
RDW: 16.3 % — AB (ref 11.5–15.5)
WBC: 2.2 10*3/uL — ABNORMAL LOW (ref 4.0–10.5)

## 2014-04-30 LAB — BASIC METABOLIC PANEL
Anion gap: 10 (ref 5–15)
BUN: 64 mg/dL — ABNORMAL HIGH (ref 6–23)
CO2: 24 mmol/L (ref 19–32)
Calcium: 8.6 mg/dL (ref 8.4–10.5)
Chloride: 100 mmol/L (ref 96–112)
Creatinine, Ser: 1.58 mg/dL — ABNORMAL HIGH (ref 0.50–1.35)
GFR, EST AFRICAN AMERICAN: 47 mL/min — AB (ref >90)
GFR, EST NON AFRICAN AMERICAN: 40 mL/min — AB (ref >90)
Glucose, Bld: 141 mg/dL — ABNORMAL HIGH (ref 70–99)
POTASSIUM: 3.9 mmol/L (ref 3.5–5.1)
Sodium: 134 mmol/L — ABNORMAL LOW (ref 135–145)

## 2014-04-30 LAB — TYPE AND SCREEN
ABO/RH(D): O POS
ANTIBODY SCREEN: NEGATIVE
Unit division: 0

## 2014-04-30 NOTE — Progress Notes (Signed)
CRITICAL VALUE ALERT  Critical value received:  Platelets 23 Date of notification:  04/30/14  Time of notification:  0930  Critical value read back:Yes.    Nurse who received alert:  Micheline Chapman   MD notified (1st page):  Tat  Time of first page:  04/30/14  MD notified (2nd page):  Time of second page:  Responding MD:  Tat  Time MD responded:  ?

## 2014-04-30 NOTE — Telephone Encounter (Signed)
I left voice mail on wifes mobile phone with pt update.

## 2014-04-30 NOTE — Progress Notes (Signed)
Physical Therapy Treatment Patient Details Name: [name] MRN: [id number] DOB: 09-24-1933 Today's Date: 04/30/2014    History of Present Illness 81 year old male with history of metastatic non-small cell lung cancer, adenocarcinoma with brain metastasis, currently undergoing systemic maintenance chemotherapy with Alimta-completed 15th cycle on 3/29, COPD, admitted to [facility] on 04/19/14 with complaints of swelling and redness of RLE with Lower extremity venous dopplers negative for DVT and diagnosed with cellulitis R LE > L LE. Hospital course complicated by chemotherapy related severe pancytopenia requiring transfusions of PRBC and platelets and Granix shots and new onset seizure     PT Comments    Pt was in bed; pt c/o of pain in LE;pt sit up to EOB c/o of SOB; check O2 sats RA 97% (contiuned to monitor during session) O2 while ambulating 97-99%; Ambulate with walker 130 ft unit hall; pt stated he has walker w/ 4 wheels at home; pt very unsteady during ambulating LOB x2, attempted cane (very unsteady); assisted pt to BR bowel movnt notified NT; pt back to chair.   Follow Up Recommendations  Home health PT;Supervision for mobility/OOB     Equipment Recommendations  None recommended by PT    Recommendations for Other Services       Precautions / Restrictions Precautions Precautions: None Restrictions Weight Bearing Restrictions: No    Mobility  Bed Mobility Overal bed mobility: Modified Independent             General bed mobility comments: HOB elevated  Transfers Overall transfer level: Needs assistance Equipment used: Rolling walker (2 wheeled) Transfers: Sit to/from Stand Sit to Stand: Min guard         General transfer comment: min guard assist for safety especially for stand to sit as he needs cues to reach back for EOB for controlled descent.  Ambulation/Gait Ambulation/Gait assistance: Min assist (Assit with safety pt unsteady) Ambulation  Distance (Feet): 130 Feet Assistive device: Rolling walker (2 wheeled) (pt stated he has walker w/ 4 wheels at home and cane) Gait Pattern/deviations: Drifts right/left;Narrow base of support;Step-through pattern;Staggering right Gait velocity: too fast safety concern    General Gait Details: very unsteady; narrow BOS;feet close togther; LOB x2; several cues to remain close to walker; cue for posture.    Stairs            Wheelchair Mobility    Modified Rankin (Stroke Patients Only)       Balance                                    Cognition Arousal/Alertness: Awake/alert Behavior During Therapy: WFL for tasks assessed/performed Overall Cognitive Status: Within Functional Limits for tasks assessed                      Exercises      General Comments        Pertinent Vitals/Pain Pain Assessment: 0-10 Pain Score: 4  Pain Location: Both LE  Pain Descriptors / Indicators: Burning Pain Intervention(s): Limited activity within patient's tolerance;Monitored during session    Home Living Family/patient expects to be discharged to:: Private residence Living Arrangements: Spouse/significant other   Type of Home: House Home Access: Stairs to enter Entrance Stairs-Rails: Right Home Layout: One level Home Equipment: Cane - single point;Shower seat - built in;Walker - 4 wheels      Prior Function Level of Independence: Independent with assistive device(s)  Comments: typically uses cane   PT Goals (current goals can now be found in the care plan section) Progress towards PT goals: Progressing toward goals    Frequency  Min 3X/week    PT Plan      Co-evaluation             End of Session Equipment Utilized During Treatment: Gait belt Activity Tolerance: Patient tolerated treatment well (safety with gait ) Patient left: in chair;with call bell/phone within reach     Time:  -     Charges:                       G Codes:       [place] PTA  04/30/2014, 1:16 PM   Reviewed above and agree  Rica Koyanagi  PTA [facility]  Acute  Rehab Pager      [phone]

## 2014-04-30 NOTE — Telephone Encounter (Signed)
TC from [facility]  In-patient nurse. She is relaying that pt's wife would like call from Dr. Julien Nordmann or his nurse regarding his current condition. She is unable to be at the hospital when the doctors see him. Please call wife @ either home # [phone] or cell # [phone]. Thank you

## 2014-04-30 NOTE — Evaluation (Signed)
Occupational Therapy Evaluation Patient Details Name: [name] MRN: [id number] DOB: 1933/11/10 Today's Date: 04/30/2014    History of Present Illness 81 year old male with history of metastatic non-small cell lung cancer, adenocarcinoma with brain metastasis, currently undergoing systemic maintenance chemotherapy with Alimta-completed 15th cycle on 3/29, COPD, admitted to [facility] on 04/19/14 with complaints of swelling and redness of RLE with Lower extremity venous dopplers negative for DVT and diagnosed with cellulitis R LE > L LE. Hospital course complicated by chemotherapy related severe pancytopenia requiring transfusions of PRBC and platelets and Granix shots and new onset seizure    Clinical Impression   Practiced up to EOB and standing for ADL. Pt did well and reports 3/10 pain in bilateral LE but states still improved from earlier today. Will follow to progress ADL for return home with wife.    Follow Up Recommendations  No OT follow up;Supervision/Assistance - 24 hour    Equipment Recommendations  None recommended by OT    Recommendations for Other Services       Precautions / Restrictions Precautions Precautions: None Restrictions Weight Bearing Restrictions: No      Mobility Bed Mobility Overal bed mobility: Modified Independent             General bed mobility comments: HOB elevated  Transfers Overall transfer level: Needs assistance Equipment used: Rolling walker (2 wheeled) Transfers: Sit to/from Stand Sit to Stand: Min guard         General transfer comment: min guard assist for safety especially for stand to sit as he needs cues to reach back for EOB for controlled descent.    Balance                                            ADL Overall ADL's : Needs assistance/impaired Eating/Feeding: Independent;Sitting   Grooming: Wash/dry hands;Set up;Sitting   Upper Body Bathing: Set up;Sitting   Lower Body  Bathing: Min guard;Sit to/from stand   Upper Body Dressing : Set up;Sitting   Lower Body Dressing: Min guard;Sit to/from stand   Toilet Transfer: Designer, television/film set Details (indicate cue type and reason): sit to stand and taking a few steps forward and backward.  Toileting- Water quality scientist and Hygiene: Min guard;Sit to/from stand         General ADL Comments: Pt has a built in shower seat and discussed use of it for safety with showering initially and energy conservation and if pain present in LEs. He has a higher commode and educated on 3in1 but pt states he feels higher commode will still be ok and has a vanity next to toilet. Pt did well with standing to manage clothing but did initially brace  LEs up against the bed. had pt step away from bed and he was able to keep his balance to manage clothing.      Vision     Perception     Praxis      Pertinent Vitals/Pain Pain Assessment: 0-10 Pain Score: 4  Pain Location: Both LE  Pain Descriptors / Indicators: Burning Pain Intervention(s): Limited activity within patient's tolerance;Monitored during session     Hand Dominance     Extremity/Trunk Assessment Upper Extremity Assessment Upper Extremity Assessment: Overall WFL for tasks assessed           Communication Communication Communication: No difficulties   Cognition Arousal/Alertness: Awake/alert Behavior  During Therapy: WFL for tasks assessed/performed Overall Cognitive Status: Within Functional Limits for tasks assessed                     General Comments       Exercises       Shoulder Instructions      Home Living Family/patient expects to be discharged to:: Private residence Living Arrangements: Spouse/significant other   Type of Home: House Home Access: Stairs to enter [employer] of Steps: 7 Entrance Stairs-Rails: Right Home Layout: One level     Bathroom Shower/Tub: Occupational psychologist:  Handicapped height     Home Equipment: [place] - single point;Shower seat - built in;Walker - 4 wheels          Prior Functioning/Environment Level of Independence: Independent with assistive device(s)        Comments: typically uses cane    OT Diagnosis: Generalized weakness   OT Problem List: Decreased strength;Decreased knowledge of use of DME or AE   OT Treatment/Interventions: Self-care/ADL training;Patient/family education;Therapeutic activities;DME and/or AE instruction    OT Goals(Current goals can be found in the care plan section) Acute Rehab OT Goals Patient Stated Goal: home OT Goal Formulation: With patient Time For Goal Achievement: 05/14/14 Potential to Achieve Goals: Good  OT Frequency: Min 2X/week   Barriers to D/C:            Co-evaluation              End of Session Equipment Utilized During Treatment: Rolling walker  Activity Tolerance: Patient tolerated treatment well Patient left: in bed;with call bell/phone within reach   Time: 1250-1305 OT Time Calculation (min): 15 min Charges:  OT General Charges $OT Visit: 1 Procedure OT Evaluation $Initial OT Evaluation Tier I: 1 Procedure G-Codes:    Jules Schick  [phone] 04/30/2014, 1:17 PM

## 2014-04-30 NOTE — Progress Notes (Signed)
PROGRESS NOTE  DELOREAN KNUTZEN HLK:[id number] DOB: 03-23-1933 DOA: 04/19/2014 PCP: [name], MD   HPI/Brief narrative 81 year old male with history of metastatic non-small cell lung cancer, adenocarcinoma with brain metastasis status post posterior tactic radiotherapy to the brain lesions followed by palliative radiotherapy to the left lower lobe lung mass, currently undergoing systemic maintenance chemotherapy with Alimta-completed 15th cycle on 3/29, COPD, admitted to [facility] on 04/19/14 with complaints of swelling and redness of RLE. Lower extremity venous Dopplers negative for DVT. Admitted for cellulitis. Hospital course complicated by chemotherapy related severe pancytopenia requiring transfusions of PRBC and platelets and Granix shots and new onset seizure 1 for which he was transferred to stepdown unit on 4/8. He was subsequently transferred back to [facility]. Assessment/Plan:   Cellulitis of legs, R>L - Initially started empirically on IV ceftriaxone for cellulitis of RLE. - Bilateral lower extremity venous Dopplers negative for DVT. - Reassessment on 4/6 showed findings suspicious for cellulitis in both legs without significant improvement in right lower extremity as per patient and family. - Rocephin was discontinued and started IV vancomycin on 4/6. - Blood cultures 2: Negative. - Lower extremities have improved with decreased erythema, warmth and tenderness but still has significant edema. His lower extremity findings now suggestive more of petechia related to thrombocytopenia and pedal edema. Some of his erythema may be chronic. Started PO Lasix for leg edema. - DC vancomycin and started oral doxycycline 4/9.  - Completed 10 days of antibiotics on 4/14 - Leg edema improving after PO Lasix. -14 L since admission. Follow clinically and BMP in a.m. - Stable for discharge from cellulitis standpoint. -04/28/14--d/c lasix and monitor creatinine--stable     Seizure 1 on 4/8 - Patient had an episode of witnessed seizure (absence like) lasting approximately 45 seconds on 4/8 AM. Aborted spontaneously. - CT head without contrast: No acute findings.  - MRI brain results as below. Performed without contrast secondary to renal insufficiency. Discussed results with Dr. Julien Nordmann 4/10 who recommended starting dexamethasone. - EEG-negative for seizures. Detailed report as below - Loaded with a gram of IV Keppra and continue maintenance Keppra 500 mg every 12 hours. - Monitor for seizures-No further seizures since 4/8 - Patient counseled extensively that he should not drive or operate heavy machinery or be at heights or in standing water for at least 6 months and medical clearance from M.D. He verbalized understanding  - Radiation oncology consult and input appreciated-agree with steroids and Keppra. Patient's case will be reviewed in brain tumor conference next week and radiation oncology will arrange outpatient follow-up   Hypotension - Patient had transient hypotension with systolic BPs in the 73S post seizure episode on 4/8. - resolved after IV fluids   CKD (chronic kidney disease), stage 3 - Creatinine at baseline. - Follow BMP periodically.   Antineoplastic chemotherapy induced pancytopenia - Last chemotherapy 3/29. - S/P 4 units PRBC transfusion since admission. - DC'ed heparin secondary to platelet count of 40 K on 4/6. 23 K on 4/7 - No reported bleeding. Platelet transfusion if active bleeding or platelet count <10 K - Brief mild left nostril epistaxis-resolved spontaneously and has not recurred in the last 72 hours. - Patient had 6 dof Granix for leukopenia/neutropenia. WBC has further dropped to 1.3 and ANC 1.1 on 4/12-->1.5 on 4/13 (last day of Granix) - Status post 2 bags of platelets on 4/8 and 4/10. Platelets improved to 38 but decreasing >27 > 16>>8 -04/28/14--transfuse  additional 2 units platelets (5 units for the admission) -  04/29/14--transfuse 1 additional units PRBCs - Follow CBCs in a.m. -  Platelets remains slow to recover -  04/30/2014--Case discussed with Dr. Julien Nordmann   Pericardial effusion without cardiac tamponade - 2-D echo shows small to moderate free-flowing pericardial effusion - Patient denies chest pain or dyspnea. Did report some dyspnea on admission which is probably multifactorial from lung mass, anemia. - No clinical features suggestive of cardiac tamponade. Monitor closely. - Cardiology consultation appreciated-no intervention needed at this time.   COPD (chronic obstructive pulmonary disease) - Stable    Primary cancer of right lower lobe of lung with brain metastasis - Oncology input appreciated. Apparently will not continue Alimta - Radiation oncology input appreciated    Tachycardia - Asymptomatic and mild -due to acute medical illness - 2-D echo with normal EF. - Likely secondary to anemia and ongoing infection. - Stable and telemetry was discontinued. -overall continues to improve   Oral thrush - Continue nystatin   Protein-calorie malnutrition, severe - Management per dietitian.   Hypokalemia - Replaced    Dyspnea - Multifactorial as stated above - VQ scan: Low probability for PE  - Improved/resolved  Transaminasemia - Unclear etiology.  - Patient today stated that he never had any GI symptoms.  - RUQ ultrasound: No acute findings to explain elevated LFTs. - Lipase: 18 - As discussed with oncologist,? Related to Alimta. Improving - Temporarily hold statins   Family Communication: Wife Opal Sidles updated on phone  Disposition Plan: Home when medically stable  Procedures/Studies: Dg Chest 2 View  04/19/2014   CLINICAL DATA:  Shortness of breath, leg swelling, lung cancer, initial encounter.  EXAM: CHEST  2 VIEW  COMPARISON:  03/06/2014 and CT chest 03/04/2014.  FINDINGS: Trachea is midline. Heart size stable. Right IJ power port tip is in the high  right atrium. Right perihilar opacification and architectural distortion appear grossly stable. Small right pleural effusion with volume loss at the base of the right hemi thorax. Reticular coarsening in the periphery of the left hemi thorax. No left pleural fluid.  IMPRESSION: 1. Right perihilar masslike opacification with architectural distortion, unchanged from 03/06/2014 and better interrogated on cross-sectional imaging 03/04/2014. 2. Small right pleural effusion, possibly increased. 3. Subpleural reticulation, indicative of fibrosis.   Electronically Signed   By: Lorin Picket M.D.   On: 04/19/2014 16:10   Ct Head Wo Contrast  04/23/2014   CLINICAL DATA:  New seizure activity  EXAM: CT HEAD WITHOUT CONTRAST  TECHNIQUE: Contiguous axial images were obtained from the base of the skull through the vertex without intravenous contrast.  COMPARISON:  None.  FINDINGS: Bony calvarium is intact. Mild atrophic changes are noted. Basal ganglia calcifications are seen. No findings to suggest acute hemorrhage, acute infarction or space-occupying mass lesion are noted.  IMPRESSION: Chronic atrophic changes without acute abnormality.   Electronically Signed   By: Inez Catalina M.D.   On: 04/23/2014 13:11   Mr Brain Wo Contrast  04/24/2014   CLINICAL DATA:  81 year old male with history of metastatic non small cell lung cancer with brain metastases treated with stereotactic radiation therapy. Currently undergoing systemic maintenance chemotherapy. Weakness seizure 04/23/2014. Subsequent encounter.  EXAM: MRI HEAD WITHOUT CONTRAST  TECHNIQUE: Multiplanar, multiecho pulse sequences of the brain and surrounding structures were obtained without intravenous contrast.  COMPARISON:  04/23/2014 head CT.  12/16/2013 brain MR.  FINDINGS: On the 12/16/2013 contrast-enhanced brain MR, patient was noted to have 3 small intracranial metastatic  lesions (right paracentral inferior vermis, medial left occipital lobe and posterior right  frontal lobe). Evaluation of these intracranial metastatic lesions is limited on the present noncontrast motion degraded exam.  Mild vasogenic edema right paracentral/ superior vermis slightly more notable than on the prior MR may reflect result of underlying tumor and/or treatment of tumor.  Minimal edema at the level of the previously noted small medial left occipital lobe may reflect result of underlying tumor and/or treatment of tumor.  No area of restricted motion to suggest intracranial metastatic disease or acute infarct.  Mild to moderate nonspecific white matter type changes may reflect result of small vessel disease and/or treatment tumor.  Global atrophy without hydrocephalus.  Major intracranial vascular structures are patent.  Cervical medullary junction, pituitary region, pineal region and orbital structures unremarkable.  Minimal to mild paranasal sinus mucosal thickening.  No evidence of mesial temporal sclerosis.  IMPRESSION: On the 12/16/2013 contrast-enhanced brain MR, patient was noted to have 3 small intracranial metastatic lesions (right paracentral inferior vermis, medial left occipital lobe and posterior right frontal lobe). Evaluation of these intracranial metastatic lesions is limited on the present noncontrast motion degraded exam.  Mild vasogenic edema right paracentral/ superior vermis slightly more notable than on the prior MR may reflect result of underlying tumor and/or treatment of tumor.  Minimal edema at the level of the previously noted small medial left occipital lobe may reflect result of underlying tumor and/or treatment of tumor.  No area of restricted motion to suggest intracranial metastatic disease or acute infarct.  Mild to moderate nonspecific white matter type changes may reflect result of small vessel disease and/or treatment tumor.  Global atrophy without hydrocephalus.   Electronically Signed   By: Genia Del M.D.   On: 04/24/2014 12:25   Nm Pulmonary Perf And  Vent  04/19/2014   CLINICAL DATA:  Shortness of breath.  EXAM: NUCLEAR MEDICINE VENTILATION - PERFUSION LUNG SCAN  TECHNIQUE: Ventilation images were obtained in multiple projections using inhaled aerosol technetium 99 M DTPA. Perfusion images were obtained in multiple projections after intravenous injection of Tc-28m MAA.  RADIOPHARMACEUTICALS:  40.0 MCi Tc-12m DTPA aerosol and 5.0 mCi Tc-38m MAA  COMPARISON:  Chest x-ray 04/19/2014 and CT chest 03/04/2014  FINDINGS: Ventilation: Patchy heterogeneous ventilation study with some linear defects likely corresponding to pleural effusions and lung disease.  Perfusion: Patchy heterogeneous perfusion scan matching the ventilation scan. No mismatching segmental or subsegmental defects to suggest pulmonary embolism.  IMPRESSION: Low probability ventilation perfusion lung scan for pulmonary embolism.   Electronically Signed   By: Marijo Sanes M.D.   On: 04/19/2014 19:10   US Abdomen Limited Ruq  04/23/2014   CLINICAL DATA:  Abnormal LFTs.  EXAM: US ABDOMEN LIMITED - RIGHT UPPER QUADRANT  COMPARISON:  None.  FINDINGS: Gallbladder:  Previous cholecystectomy.  Common bile duct:  Diameter: 2 mm  Liver:  No focal lesion identified. Within normal limits in parenchymal echogenicity.  Other:  Right pleural effusion noted.  IMPRESSION: 1. No findings to explain patient's elevated LFTs. 2. Prior cholecystectomy 3. Right pleural effusion   Electronically Signed   By: Kerby Moors M.D.   On: 04/23/2014 16:29         Subjective: Patient denies fevers, chills, headache, chest pain, dyspnea, nausea, vomiting, diarrhea, abdominal pain, dysuria, hematuria   Objective: Filed Vitals:   04/29/14 1800 04/29/14 2125 04/30/14 0617 04/30/14 1522  BP: 129/83 123/82 145/88 120/79  Pulse: 97 95 90 91  Temp: 98.4 F (36.9  C) 98 F (36.7 C) 97.5 F (36.4 C) 97.6 F (36.4 C)  TempSrc: Oral Oral Oral Oral  Resp: 18 18  18   Height:      Weight:   71.7 kg (158 lb 1.1 oz)     SpO2: 99% 98% 99% 100%    Intake/Output Summary (Last 24 hours) at 04/30/14 1937 Last data filed at 04/30/14 1444  Gross per 24 hour  Intake    480 ml  Output   2100 ml  Net  -1620 ml   Weight change: -1.8 kg (-3 lb 15.5 oz) Exam:   General:  Pt is alert, follows commands appropriately, not in acute distress  HEENT: No icterus, No thrush,  /AT  Cardiovascular: RRR, S1/S2, no rubs, no gallops  Respiratory:  Diminished breath sounds at the bases but clear o auscultation. No wheeze.  Abdomen: Soft/+BS, non tender, non distended, no guarding  Extremities: 1+LE edema, No lymphangitis, No petechiae, No rashes, no synovitis  Data Reviewed: Basic Metabolic Panel:  Recent Labs Lab 04/25/14 0355 04/26/14 0459 04/28/14 0430 04/29/14 0515 04/30/14 0923  NA 139 140 137 136 134*  K 3.7 4.1 4.3 4.4 3.9  CL 106 106 103 103 100  CO2 23 23 24 25 24   GLUCOSE 114* 146* 124* 122* 141*  BUN 31* 33* 55* 60* 64*  CREATININE 1.56* 1.61* 1.58* 1.60* 1.58*  CALCIUM 8.4 8.7 8.5 8.7 8.6   Liver Function Tests:  Recent Labs Lab 04/24/14 0525 04/25/14 0355 04/26/14 0459 04/28/14 0430  AST 251* 249* 269* 160*  ALT 225* 240* 281* 233*  ALKPHOS 180* 202* 223* 244*  BILITOT 1.7* 2.4* 2.1* 1.9*  PROT 5.2* 5.3* 5.8* 5.6*  ALBUMIN 2.1* 2.1* 2.2* 2.2*   No results for input(s): LIPASE, AMYLASE in the last 168 hours. No results for input(s): AMMONIA in the last 168 hours. CBC:  Recent Labs Lab 04/25/14 0355 04/25/14 1400 04/26/14 0459 04/27/14 0448 04/28/14 0430 04/29/14 0515 04/30/14 0923  WBC 1.5* 1.5* 1.5* 1.3* 1.5* 1.6* 2.2*  NEUTROABS 1.0* 1.0* 1.4* 1.1*  --   --   --   HGB 8.0* 7.5* 8.0* 7.5* 7.5* 7.1* 8.7*  HCT 23.6* 21.8* 22.8* 21.5* 21.7* 20.3* 25.6*  MCV 90.4 91.2 90.5 90.3 90.4 89.8 89.2  PLT 7* 38* 27* 16* 8* 36* 23*   Cardiac Enzymes: No results for input(s): CKTOTAL, CKMB, CKMBINDEX, TROPONINI in the last 168 hours. BNP: Invalid input(s): POCBNP CBG: No  results for input(s): GLUCAP in the last 168 hours.  Recent Results (from the past 240 hour(s))  MRSA PCR Screening     Status: None   Collection Time: 04/23/14  1:15 PM  Result Value Ref Range Status   MRSA by PCR NEGATIVE NEGATIVE Final    Comment:        The GeneXpert MRSA Assay (FDA approved for NASAL specimens only), is one component of a comprehensive MRSA colonization surveillance program. It is not intended to diagnose MRSA infection nor to guide or monitor treatment for MRSA infections.      Scheduled Meds: . sodium chloride   Intravenous Once  . sodium chloride   Intravenous Once  . sodium chloride   Intravenous Once  . antiseptic oral rinse  7 mL Mouth Rinse q12n4p  . chlorhexidine  15 mL Mouth Rinse BID  . dexamethasone  4 mg Oral BID  . feeding supplement (ENSURE ENLIVE)  237 mL Oral TID BM  . ferrous sulfate  325 mg Oral Q lunch  .  folic acid  771 mcg Oral Daily  . hydrocerin   Topical BID  . levETIRAcetam  500 mg Oral BID  . megestrol  400 mg Oral BID  . nystatin  5 mL Oral QID  . polyvinyl alcohol   Both Eyes Daily   Continuous Infusions:    Monterius Rolf, DO  [facility] Pager [phone]  If 7PM-7AM, please contact night-coverage [email or web address] Password [facility] 04/30/2014, 7:37 PM   LOS: 11 days

## 2014-05-01 LAB — CBC
HCT: 24 % — ABNORMAL LOW (ref 39.0–52.0)
Hemoglobin: 8.3 g/dL — ABNORMAL LOW (ref 13.0–17.0)
MCH: 30.6 pg (ref 26.0–34.0)
MCHC: 34.6 g/dL (ref 30.0–36.0)
MCV: 88.6 fL (ref 78.0–100.0)
PLATELETS: 14 10*3/uL — AB (ref 150–400)
RBC: 2.71 MIL/uL — AB (ref 4.22–5.81)
RDW: 16.1 % — ABNORMAL HIGH (ref 11.5–15.5)
WBC: 2.7 10*3/uL — ABNORMAL LOW (ref 4.0–10.5)

## 2014-05-01 NOTE — Progress Notes (Signed)
PROGRESS NOTE  [name] CWC:[id number] DOB: 01-Aug-1933 DOA: 04/19/2014 PCP: [name], MD   HPI/Brief narrative 81 year old male with history of metastatic non-small cell lung cancer, adenocarcinoma with brain metastasis status post posterior tactic radiotherapy to the brain lesions followed by palliative radiotherapy to the left lower lobe lung mass, currently undergoing systemic maintenance chemotherapy with Alimta-completed 15th cycle on 3/29, COPD, admitted to [facility] on 04/19/14 with complaints of swelling and redness of RLE. Lower extremity venous Dopplers negative for DVT. Admitted for cellulitis. Hospital course complicated by chemotherapy related severe pancytopenia requiring transfusions of PRBC and platelets and Granix shots and new onset seizure 1 for which he was transferred to stepdown unit on 4/8. He was subsequently transferred back to [facility], and he remained clinically stable thereafter. Unfortunately, the patient's hospital stay was prolonged due to his pancytopenia Assessment/Plan:   Cellulitis of legs, R>L - Initially started empirically on IV ceftriaxone for cellulitis of RLE. - Bilateral lower extremity venous Dopplers negative for DVT. - Reassessment on 4/6 showed findings suspicious for cellulitis in both legs without significant improvement in right lower extremity as per patient and family. - Rocephin was discontinued and started IV vancomycin on 4/6. - Blood cultures 2: Negative. - Lower extremities have improved with decreased erythema, warmth and tenderness but still has significant edema. His lower extremity findings now suggestive more of petechia related to thrombocytopenia and pedal edema. Some of his erythema may be chronic. Started PO Lasix for leg edema. - DC vancomycin and started oral doxycycline 4/9.  - Completed 10 days of antibiotics on 4/14 - Leg edema improving after PO Lasix. -14 L since admission. Follow clinically and  BMP in a.m. - Stable for discharge from cellulitis standpoint. -04/28/14--d/c lasix and monitor creatinine--stable    Seizure 1 on 4/8 - Patient had an episode of witnessed seizure (absence like) lasting approximately 45 seconds on 4/8 AM. Aborted spontaneously. - CT head without contrast: No acute findings.  - MRI brain results as below. Performed without contrast secondary to renal insufficiency. Discussed results with Dr. Julien Nordmann 4/10 who recommended starting dexamethasone. - EEG-negative for seizures. Detailed report as below - Loaded with a gram of IV Keppra and continue maintenance Keppra 500 mg every 12 hours. - Monitor for seizures-No further seizures since 4/8 - Patient counseled extensively that he should not drive or operate heavy machinery or be at heights or in standing water for at least 6 months and medical clearance from M.D. He verbalized understanding  - Radiation oncology consult and input appreciated-agree with steroids and Keppra. Patient's case will be reviewed in brain tumor conference next week and radiation oncology will arrange outpatient follow-up   Hypotension - Patient had transient hypotension with systolic BPs in the 76H post seizure episode on 4/8. - resolved after IV fluids   CKD (chronic kidney disease), stage 3 - Creatinine at baseline. - Follow BMP periodically.   Antineoplastic chemotherapy induced pancytopenia - Last chemotherapy 3/29. - S/P 4 units PRBC transfusion since admission. - DC'ed heparin secondary to platelet count of 40 K on 4/6. 23 K on 4/7 - No reported bleeding. Platelet transfusion if active bleeding or platelet count <10 K - Brief mild left nostril epistaxis-resolved spontaneously and has not recurred in the last 72 hours. - Patient had 6 dof Granix for leukopenia/neutropenia. WBC has further dropped to 1.3 and ANC 1.1 on 4/12-->1.5 on 4/13 (last day of Granix) - Status post 2  bags of platelets on 4/8 and 4/10. Platelets  improved to 38 but decreasing >27 > 16>>8 -04/28/14--transfuse additional 2 units platelets (5 units for the admission) - 04/29/14--transfuse 1 additional units PRBCs - Follow CBCs in a.m. - Platelets remains slow to recover--hopefully plateaus on 4/17 - 04/30/2014--Case discussed with Dr. Julien Nordmann   Pericardial effusion without cardiac tamponade - 2-D echo shows small to moderate free-flowing pericardial effusion - Patient denies chest pain or dyspnea. Did report some dyspnea on admission which is probably multifactorial from lung mass, anemia. - No clinical features suggestive of cardiac tamponade. Monitor closely. - Cardiology consultation appreciated-no intervention needed at this time.   COPD (chronic obstructive pulmonary disease) - Stable    Primary cancer of right lower lobe of lung with brain metastasis - Oncology input appreciated. Apparently will not continue Alimta - Radiation oncology input appreciated    Tachycardia - Asymptomatic and mild -due to acute medical illness - 2-D echo with normal EF. - Likely secondary to anemia and ongoing infection. - Stable and telemetry was discontinued. -overall continues to improve   Oral thrush - Continue nystatin   Protein-calorie malnutrition, severe - Management per dietitian.   Hypokalemia - Replaced    Dyspnea - Multifactorial as stated above - VQ scan: Low probability for PE  - Improved/resolved  Transaminasemia - Unclear etiology.  - Patient today stated that he never had any GI symptoms.  - RUQ ultrasound: No acute findings to explain elevated LFTs. - Lipase: 18 - As discussed with oncologist,? Related to Alimta. Improving - Temporarily hold statins   Family Communication: Wife Opal Sidles updated on phone  Disposition Plan: Home when medically stable   Procedures/Studies: Dg Chest 2 View  04/19/2014   CLINICAL DATA:  Shortness of breath, leg swelling, lung cancer, initial encounter.  EXAM:  CHEST  2 VIEW  COMPARISON:  03/06/2014 and CT chest 03/04/2014.  FINDINGS: Trachea is midline. Heart size stable. Right IJ power port tip is in the high right atrium. Right perihilar opacification and architectural distortion appear grossly stable. Small right pleural effusion with volume loss at the base of the right hemi thorax. Reticular coarsening in the periphery of the left hemi thorax. No left pleural fluid.  IMPRESSION: 1. Right perihilar masslike opacification with architectural distortion, unchanged from 03/06/2014 and better interrogated on cross-sectional imaging 03/04/2014. 2. Small right pleural effusion, possibly increased. 3. Subpleural reticulation, indicative of fibrosis.   Electronically Signed   By: Lorin Picket M.D.   On: 04/19/2014 16:10   Ct Head Wo Contrast  04/23/2014   CLINICAL DATA:  New seizure activity  EXAM: CT HEAD WITHOUT CONTRAST  TECHNIQUE: Contiguous axial images were obtained from the base of the skull through the vertex without intravenous contrast.  COMPARISON:  None.  FINDINGS: Bony calvarium is intact. Mild atrophic changes are noted. Basal ganglia calcifications are seen. No findings to suggest acute hemorrhage, acute infarction or space-occupying mass lesion are noted.  IMPRESSION: Chronic atrophic changes without acute abnormality.   Electronically Signed   By: Inez Catalina M.D.   On: 04/23/2014 13:11   Mr Brain Wo Contrast  04/24/2014   CLINICAL DATA:  81 year old male with history of metastatic non small cell lung cancer with brain metastases treated with stereotactic radiation therapy. Currently undergoing systemic maintenance chemotherapy. Weakness seizure 04/23/2014. Subsequent encounter.  EXAM: MRI HEAD WITHOUT CONTRAST  TECHNIQUE: Multiplanar, multiecho pulse sequences of the brain and surrounding structures were obtained without intravenous contrast.  COMPARISON:  04/23/2014 head CT.  12/16/2013  brain MR.  FINDINGS: On the 12/16/2013 contrast-enhanced brain  MR, patient was noted to have 3 small intracranial metastatic lesions (right paracentral inferior vermis, medial left occipital lobe and posterior right frontal lobe). Evaluation of these intracranial metastatic lesions is limited on the present noncontrast motion degraded exam.  Mild vasogenic edema right paracentral/ superior vermis slightly more notable than on the prior MR may reflect result of underlying tumor and/or treatment of tumor.  Minimal edema at the level of the previously noted small medial left occipital lobe may reflect result of underlying tumor and/or treatment of tumor.  No area of restricted motion to suggest intracranial metastatic disease or acute infarct.  Mild to moderate nonspecific white matter type changes may reflect result of small vessel disease and/or treatment tumor.  Global atrophy without hydrocephalus.  Major intracranial vascular structures are patent.  Cervical medullary junction, pituitary region, pineal region and orbital structures unremarkable.  Minimal to mild paranasal sinus mucosal thickening.  No evidence of mesial temporal sclerosis.  IMPRESSION: On the 12/16/2013 contrast-enhanced brain MR, patient was noted to have 3 small intracranial metastatic lesions (right paracentral inferior vermis, medial left occipital lobe and posterior right frontal lobe). Evaluation of these intracranial metastatic lesions is limited on the present noncontrast motion degraded exam.  Mild vasogenic edema right paracentral/ superior vermis slightly more notable than on the prior MR may reflect result of underlying tumor and/or treatment of tumor.  Minimal edema at the level of the previously noted small medial left occipital lobe may reflect result of underlying tumor and/or treatment of tumor.  No area of restricted motion to suggest intracranial metastatic disease or acute infarct.  Mild to moderate nonspecific white matter type changes may reflect result of small vessel disease and/or  treatment tumor.  Global atrophy without hydrocephalus.   Electronically Signed   By: Genia Del M.D.   On: 04/24/2014 12:25   Nm Pulmonary Perf And Vent  04/19/2014   CLINICAL DATA:  Shortness of breath.  EXAM: NUCLEAR MEDICINE VENTILATION - PERFUSION LUNG SCAN  TECHNIQUE: Ventilation images were obtained in multiple projections using inhaled aerosol technetium 99 M DTPA. Perfusion images were obtained in multiple projections after intravenous injection of Tc-63m MAA.  RADIOPHARMACEUTICALS:  40.0 MCi Tc-60m DTPA aerosol and 5.0 mCi Tc-7m MAA  COMPARISON:  Chest x-ray 04/19/2014 and CT chest 03/04/2014  FINDINGS: Ventilation: Patchy heterogeneous ventilation study with some linear defects likely corresponding to pleural effusions and lung disease.  Perfusion: Patchy heterogeneous perfusion scan matching the ventilation scan. No mismatching segmental or subsegmental defects to suggest pulmonary embolism.  IMPRESSION: Low probability ventilation perfusion lung scan for pulmonary embolism.   Electronically Signed   By: Marijo Sanes M.D.   On: 04/19/2014 19:10   US Abdomen Limited Ruq  04/23/2014   CLINICAL DATA:  Abnormal LFTs.  EXAM: US ABDOMEN LIMITED - RIGHT UPPER QUADRANT  COMPARISON:  None.  FINDINGS: Gallbladder:  Previous cholecystectomy.  Common bile duct:  Diameter: 2 mm  Liver:  No focal lesion identified. Within normal limits in parenchymal echogenicity.  Other:  Right pleural effusion noted.  IMPRESSION: 1. No findings to explain patient's elevated LFTs. 2. Prior cholecystectomy 3. Right pleural effusion   Electronically Signed   By: Kerby Moors M.D.   On: 04/23/2014 16:29         Subjective: Patient denies fevers, chills, headache, chest pain, dyspnea, nausea, vomiting, diarrhea, abdominal pain, dysuria, hematuria   Objective: Filed Vitals:   04/30/14 2150 05/01/14 0048 05/01/14 2025  05/01/14 1400  BP: 120/76  121/80 110/72  Pulse: 100 117 105 109  Temp: 98.2 F (36.8 C)  98.1  F (36.7 C) 97.7 F (36.5 C)  TempSrc: Oral  Oral Oral  Resp: 18  18 18   Height:      Weight:   71.2 kg (156 lb 15.5 oz)   SpO2: 100%  100% 100%    Intake/Output Summary (Last 24 hours) at 05/01/14 1834 Last data filed at 05/01/14 1756  Gross per 24 hour  Intake    720 ml  Output   2150 ml  Net  -1430 ml   Weight change: -0.5 kg (-1 lb 1.6 oz) Exam:   General:  Pt is alert, follows commands appropriately, not in acute distress  HEENT: No icterus, No thrush,  [place]/AT  Cardiovascular: RRR, S1/S2, no rubs, no gallops  Respiratory: CTA bilaterally, no wheezing, no crackles, no rhonchi  Abdomen: Soft/+BS, non tender, non distended, no guarding  Extremities: 1+ edema, No lymphangitis, no synovitis; scattered petechiae with non-blanchable erythema   Data Reviewed: Basic Metabolic Panel:  Recent Labs Lab 04/25/14 0355 04/26/14 0459 04/28/14 0430 04/29/14 0515 04/30/14 0923  NA 139 140 137 136 134*  K 3.7 4.1 4.3 4.4 3.9  CL 106 106 103 103 100  CO2 23 23 24 25 24   GLUCOSE 114* 146* 124* 122* 141*  BUN 31* 33* 55* 60* 64*  CREATININE 1.56* 1.61* 1.58* 1.60* 1.58*  CALCIUM 8.4 8.7 8.5 8.7 8.6   Liver Function Tests:  Recent Labs Lab 04/25/14 0355 04/26/14 0459 04/28/14 0430  AST 249* 269* 160*  ALT 240* 281* 233*  ALKPHOS 202* 223* 244*  BILITOT 2.4* 2.1* 1.9*  PROT 5.3* 5.8* 5.6*  ALBUMIN 2.1* 2.2* 2.2*   No results for input(s): LIPASE, AMYLASE in the last 168 hours. No results for input(s): AMMONIA in the last 168 hours. CBC:  Recent Labs Lab 04/25/14 0355 04/25/14 1400 04/26/14 0459 04/27/14 0448 04/28/14 0430 04/29/14 0515 04/30/14 0923 05/01/14 0445  WBC 1.5* 1.5* 1.5* 1.3* 1.5* 1.6* 2.2* 2.7*  NEUTROABS 1.0* 1.0* 1.4* 1.1*  --   --   --   --   HGB 8.0* 7.5* 8.0* 7.5* 7.5* 7.1* 8.7* 8.3*  HCT 23.6* 21.8* 22.8* 21.5* 21.7* 20.3* 25.6* 24.0*  MCV 90.4 91.2 90.5 90.3 90.4 89.8 89.2 88.6  PLT 7* 38* 27* 16* 8* 36* 23* 14*   Cardiac  Enzymes: No results for input(s): CKTOTAL, CKMB, CKMBINDEX, TROPONINI in the last 168 hours. BNP: Invalid input(s): POCBNP CBG: No results for input(s): GLUCAP in the last 168 hours.  Recent Results (from the past 240 hour(s))  MRSA PCR Screening     Status: None   Collection Time: 04/23/14  1:15 PM  Result Value Ref Range Status   MRSA by PCR NEGATIVE NEGATIVE Final    Comment:        The GeneXpert MRSA Assay (FDA approved for NASAL specimens only), is one component of a comprehensive MRSA colonization surveillance program. It is not intended to diagnose MRSA infection nor to guide or monitor treatment for MRSA infections.      Scheduled Meds: . sodium chloride   Intravenous Once  . sodium chloride   Intravenous Once  . sodium chloride   Intravenous Once  . antiseptic oral rinse  7 mL Mouth Rinse q12n4p  . chlorhexidine  15 mL Mouth Rinse BID  . dexamethasone  4 mg Oral BID  . feeding supplement (ENSURE ENLIVE)  237 mL  Oral TID BM  . ferrous sulfate  325 mg Oral Q lunch  . folic acid  579 mcg Oral Daily  . hydrocerin   Topical BID  . levETIRAcetam  500 mg Oral BID  . megestrol  400 mg Oral BID  . nystatin  5 mL Oral QID  . polyvinyl alcohol   Both Eyes Daily   Continuous Infusions:    Field Staniszewski, DO  [facility] Pager [phone]  If 7PM-7AM, please contact night-coverage [email or web address] Password [facility] 05/01/2014, 6:34 PM   LOS: 12 days

## 2014-05-01 NOTE — Progress Notes (Signed)
CRITICAL VALUE ALERT  Critical value received: Platelet 14 Suzie  Date of notification: 05/01/14  Time of notification:  0630 Critical value read back: yes  Nurse who received alert: Mayra Reel RN  MD notified (1st page):  Fredirick Maudlin [phone]  Time of first page:  0631  MD notified (2nd page):  Time of second page:  Responding MD:  Fredirick Maudlin   Time MD responded:  [phone]

## 2014-05-02 DIAGNOSIS — C7931 Secondary malignant neoplasm of brain: Secondary | ICD-10-CM

## 2014-05-02 MED ORDER — HEPARIN SOD (PORK) LOCK FLUSH 100 UNIT/ML IV SOLN
500.0000 [IU] | INTRAVENOUS | Status: AC | PRN
  Administered 2014-05-02: 500 [IU]

## 2014-05-02 MED ORDER — SODIUM CHLORIDE 0.9 % IV SOLN
Freq: Once | INTRAVENOUS | Status: AC
  Administered 2014-05-02: 11:00:00 via INTRAVENOUS

## 2014-05-02 MED ORDER — LEVETIRACETAM 500 MG PO TABS: 500.0000 mg | ORAL_TABLET | Freq: Two times a day (BID) | ORAL | Status: AC

## 2014-05-02 MED ORDER — DEXAMETHASONE 4 MG PO TABS: 4.0000 mg | ORAL_TABLET | Freq: Two times a day (BID) | ORAL | Status: DC

## 2014-05-02 NOTE — Progress Notes (Signed)
Patient received 1 unit platelets with no s/s of reaction.  VS stable.  Leaving in wheelchair accompanied by wife and son.  NT transporting pt.  No s/s of distress,.

## 2014-05-03 ENCOUNTER — Telehealth: Payer: Self-pay | Admitting: *Deleted

## 2014-05-03 ENCOUNTER — Ambulatory Visit: Payer: Medicare Other | Admitting: Radiation Oncology

## 2014-05-03 LAB — CBC
HCT: 24.5 % — ABNORMAL LOW (ref 39.0–52.0)
HEMOGLOBIN: 8.5 g/dL — AB (ref 13.0–17.0)
MCH: 31 pg (ref 26.0–34.0)
MCHC: 34.7 g/dL (ref 30.0–36.0)
MCV: 89.4 fL (ref 78.0–100.0)
Platelets: 10 10*3/uL — CL (ref 150–400)
RBC: 2.74 MIL/uL — ABNORMAL LOW (ref 4.22–5.81)
RDW: 15.9 % — AB (ref 11.5–15.5)
WBC: 2.9 10*3/uL — ABNORMAL LOW (ref 4.0–10.5)

## 2014-05-03 LAB — PREPARE PLATELET PHERESIS: Unit division: 0

## 2014-05-03 NOTE — Telephone Encounter (Signed)
I will see the patient with the physician assistant tomorrow and discuss with them all the details regarding his prognosis and further options.

## 2014-05-03 NOTE — Discharge Summary (Signed)
Physician Discharge Summary  [name] NID:[id number] DOB: Jan 31, 1933 DOA: 04/19/2014  PCP: [name], MD  Admit date: 04/19/2014 Discharge date: 05/02/2014 Recommendations for Outpatient Follow-up:  1. Pt will need to follow up with PCP in 2 weeks post discharge 2. Please obtain CBC in 1-2 days  3. Follow up with Dr. Julien Nordmann this week  Discharge Diagnoses:   Cellulitis of legs, R>L - Initially started empirically on IV ceftriaxone for cellulitis of RLE. - Bilateral lower extremity venous Dopplers negative for DVT. - Reassessment on 4/6 showed findings suspicious for cellulitis in both legs without significant improvement in right lower extremity as per patient and family. - Rocephin was discontinued and started IV vancomycin on 4/6. - Blood cultures 2: Negative. - Lower extremities have improved with decreased erythema, warmth and tenderness but still has significant edema. His lower extremity findings now suggestive more of petechia related to thrombocytopenia and pedal edema. Some of his erythema may be chronic. Started PO Lasix for leg edema. - DC vancomycin and started oral doxycycline 4/9.  - Completed 10 days of antibiotics on 4/14 and remained stable - Leg edema improving after PO Lasix. -14 L since admission. Follow clinically and BMP in a.m. - Stable for discharge from cellulitis standpoint. -04/28/14--d/c lasix and monitor creatinine--stable    Seizure 1 on 4/8 - Patient had an episode of witnessed seizure (absence like) lasting approximately 45 seconds on 4/8 AM. Aborted spontaneously. - CT head without contrast: No acute findings.  - MRI brain results as below. Performed without contrast secondary to renal insufficiency. Discussed results with Dr. Julien Nordmann 4/10 who recommended starting dexamethasone. - EEG-negative for seizures. Detailed report as below - Loaded with a gram of IV Keppra and continue maintenance Keppra 500 mg every 12 hours. - Monitor for  seizures-No further seizures since 4/8 - Patient counseled extensively that he should not drive or operate heavy machinery or be at heights or in standing water for at least 6 months and medical clearance from M.D. He verbalized understanding  - Radiation oncology consult and input appreciated-agree with steroids and Keppra. Patient's case will be reviewed in brain tumor conference next week and radiation oncology will arrange outpatient follow-up -Discharged home with dexamethasone 4 mg twice a day -He will continue Keppra 500 mg twice a day   Hypotension - Patient had transient hypotension with systolic BPs in the 14E post seizure episode on 4/8. - resolved after IV fluids   CKD (chronic kidney disease), stage 3 - Creatinine at baseline. - Follow BMP periodically.   Antineoplastic chemotherapy induced pancytopenia - Last chemotherapy 3/29. - S/P 4 units PRBC transfusion since admission. - DC'ed heparin secondary to platelet count of 40 K on 4/6. 23 K on 4/7 - No reported bleeding. Platelet transfusion if active bleeding or platelet count <10 K - Brief mild left nostril epistaxis-resolved spontaneously and has not recurred in the last 72 hours. - Patient had 6 dof Granix for leukopenia/neutropenia. WBC has further dropped to 1.3 and ANC 1.1 on 4/12-->1.5 on 4/13 (last day of Granix) - Status post 2 bags of platelets on 4/8 and 4/10. Platelets improved to 38 but decreasing >27 > 16>>8 -04/28/14--transfuse additional 2 units platelets (5 units for the admission) - 04/29/14--transfuse 1 additional units PRBCs - Follow CBCs in a.m. - Platelets remains slow to recover--hopefully plateaus on 4/17 - 04/30/2014--Case discussed with Dr. Julien Nordmann -05/02/2014 --On the day of discharge, the patient had platelets of 10,000--he was transfused 1 additional unit--it appeared that the patient's  platelet decline was reaching a plateau -The patient really wanted to go home since the patient did not  have any bleeding complications throughout the hospitalization and his platelet counts appear to be reaching a plateau in terms of the drop, the patient was transfused 1 unit of platelets on the day of discharge and instructed to follow-up with Dr. Julien Nordmann in 2-3 days.;    Pericardial effusion without cardiac tamponade - 2-D echo shows small to moderate free-flowing pericardial effusion - Patient denies chest pain or dyspnea. Did report some dyspnea on admission which is probably multifactorial from lung mass, anemia. - No clinical features suggestive of cardiac tamponade. Monitor closely. - Cardiology consultation appreciated-no intervention needed at this time.   COPD (chronic obstructive pulmonary disease) - Stable    Primary cancer of right lower lobe of lung with brain metastasis - Oncology input appreciated. Apparently will not continue Alimta - Radiation oncology input appreciated    Tachycardia - Asymptomatic and mild -due to acute medical illness - 2-D echo with normal EF. - Likely secondary to anemia and ongoing infection. - Stable and telemetry was discontinued. -overall continues to improve   Oral thrush - Continue nystatin   Protein-calorie malnutrition, severe - Management per dietitian. -home with ensure complete -continue megestrol   Hypokalemia - Replaced    Dyspnea - Multifactorial as stated above - VQ scan: Low probability for PE  - Improved/resolved  Transaminasemia - Unclear etiology.  - Patient today stated that he never had any GI symptoms.  - RUQ ultrasound: No acute findings to explain elevated LFTs. - Lipase: 18 - As discussed with oncologist,? Related to Alimta. Improving - Temporarily hold statins Discharge Condition: stable  Disposition:  Follow-up Information    Follow up with [facility] MD In 1 week.   Specialty:  Oncology   Contact information:   [address] [place] [address] [phone]        Diet:regular Wt Readings from Last 3 Encounters:  05/02/14 71.7 kg (158 lb 1.1 oz)  04/13/14 73.12 kg (161 lb 3.2 oz)  03/16/14 68.901 kg (151 lb 14.4 oz)    History of present illness:  81 year old male with history of metastatic non-small cell lung cancer, adenocarcinoma with brain metastasis status post posterior tactic radiotherapy to the brain lesions followed by palliative radiotherapy to the left lower lobe lung mass, currently undergoing systemic maintenance chemotherapy with Alimta-completed 15th cycle on 3/29, COPD, admitted to [facility] on 04/19/14 with complaints of swelling and redness of RLE. Lower extremity venous Dopplers negative for DVT. Admitted for cellulitis. Hospital course complicated by chemotherapy related severe pancytopenia requiring transfusions of PRBC and platelets and Granix shots and new onset seizure 1 for which he was transferred to stepdown unit on 4/8. He was subsequently transferred back to [facility], and he remained clinically stable thereafter. Unfortunately, the patient's hospital stay was prolonged due to his pancytopenia.fortunately, the patient's drops and platelets appear to be reaching a plateau. Although his platelets did drop somewhat on the day of discharge, he was transfused 1 unit of packed platelets and instructed to follow-up with Dr. Julien Nordmann in the office in 2-3 days. The patient did not have any bleeding complications.  Consultants: Mohamed  Discharge Exam: Filed Vitals:   05/02/14 1234  BP: 120/68  Pulse: 95  Temp: 97.4 F (36.3 C)  Resp: 18   Filed Vitals:   05/02/14 0547 05/02/14 1020 05/02/14 1053 05/02/14 1234  BP: 123/82 120/75 118/73 120/68  Pulse: 97 97  96 95  Temp: 97.8 F (36.6 C) 97.7 F (36.5 C) 97.5 F (36.4 C) 97.4 F (36.3 C)  TempSrc: Oral Oral Oral Axillary  Resp: 18 18 18 18   Height:      Weight:      SpO2: 99% 100% 100% 100%   General: A&O x 3, NAD, pleasant, cooperative Cardiovascular: RRR,  no rub, no gallop, no S3 Respiratory: CTAB, no wheeze, no rhonchi Abdomen:soft, nontender, nondistended, positive bowel sounds Extremities: 1+ edema, No lymphangitis, no petechiae  Discharge Instructions  Discharge Instructions    Diet - low sodium heart healthy    Complete by:  As directed      Increase activity slowly    Complete by:  As directed             Medication List    STOP taking these medications        ALIMTA IV      TAKE these medications        acetaminophen 500 MG tablet  Commonly known as:  TYLENOL  Take 500 mg by mouth every 6 (six) hours as needed for mild pain.     clobetasol cream 0.05 %  Commonly known as:  TEMOVATE  Apply 1 application topically daily as needed (for itching).     dexamethasone 4 MG tablet  Commonly known as:  DECADRON  Take 1 tablet (4 mg total) by mouth 2 (two) times daily.     feeding supplement (ENSURE COMPLETE) Liqd  Take 237 mLs by mouth 3 (three) times daily between meals.     folic acid 867 MCG tablet  Commonly known as:  FOLVITE  Take 400 mcg by mouth every morning.     furosemide 20 MG tablet  Commonly known as:  LASIX  Take 1 tablet by mouth daily.     IRON SUPPLEMENT 325 (65 FE) MG tablet  Generic drug:  ferrous sulfate  Take 325 mg by mouth daily with breakfast.     levETIRAcetam 500 MG tablet  Commonly known as:  KEPPRA  Take 1 tablet (500 mg total) by mouth 2 (two) times daily.     magic mouthwash Soln  Take 5 mLs by mouth 3 (three) times daily as needed for mouth pain.     megestrol 40 MG tablet  Commonly known as:  MEGACE  Take 1 tablet (40 mg total) by mouth daily.     simvastatin 10 MG tablet  Commonly known as:  ZOCOR  Take 10 mg by mouth at bedtime.     SYSTANE OP  Place 1 drop into both eyes daily.         The results of significant diagnostics from this hospitalization (including imaging, microbiology, ancillary and laboratory) are listed below for reference.    Significant  Diagnostic Studies: Dg Chest 2 View  04/19/2014   CLINICAL DATA:  Shortness of breath, leg swelling, lung cancer, initial encounter.  EXAM: CHEST  2 VIEW  COMPARISON:  03/06/2014 and CT chest 03/04/2014.  FINDINGS: Trachea is midline. Heart size stable. Right IJ power port tip is in the high right atrium. Right perihilar opacification and architectural distortion appear grossly stable. Small right pleural effusion with volume loss at the base of the right hemi thorax. Reticular coarsening in the periphery of the left hemi thorax. No left pleural fluid.  IMPRESSION: 1. Right perihilar masslike opacification with architectural distortion, unchanged from 03/06/2014 and better interrogated on cross-sectional imaging 03/04/2014. 2. Small right pleural effusion, possibly increased. 3.  Subpleural reticulation, indicative of fibrosis.   Electronically Signed   By: Lorin Picket M.D.   On: 04/19/2014 16:10   Ct Head Wo Contrast  04/23/2014   CLINICAL DATA:  New seizure activity  EXAM: CT HEAD WITHOUT CONTRAST  TECHNIQUE: Contiguous axial images were obtained from the base of the skull through the vertex without intravenous contrast.  COMPARISON:  None.  FINDINGS: Bony calvarium is intact. Mild atrophic changes are noted. Basal ganglia calcifications are seen. No findings to suggest acute hemorrhage, acute infarction or space-occupying mass lesion are noted.  IMPRESSION: Chronic atrophic changes without acute abnormality.   Electronically Signed   By: Inez Catalina M.D.   On: 04/23/2014 13:11   Mr Brain Wo Contrast  04/24/2014   CLINICAL DATA:  81 year old male with history of metastatic non small cell lung cancer with brain metastases treated with stereotactic radiation therapy. Currently undergoing systemic maintenance chemotherapy. Weakness seizure 04/23/2014. Subsequent encounter.  EXAM: MRI HEAD WITHOUT CONTRAST  TECHNIQUE: Multiplanar, multiecho pulse sequences of the brain and surrounding structures were obtained  without intravenous contrast.  COMPARISON:  04/23/2014 head CT.  12/16/2013 brain MR.  FINDINGS: On the 12/16/2013 contrast-enhanced brain MR, patient was noted to have 3 small intracranial metastatic lesions (right paracentral inferior vermis, medial left occipital lobe and posterior right frontal lobe). Evaluation of these intracranial metastatic lesions is limited on the present noncontrast motion degraded exam.  Mild vasogenic edema right paracentral/ superior vermis slightly more notable than on the prior MR may reflect result of underlying tumor and/or treatment of tumor.  Minimal edema at the level of the previously noted small medial left occipital lobe may reflect result of underlying tumor and/or treatment of tumor.  No area of restricted motion to suggest intracranial metastatic disease or acute infarct.  Mild to moderate nonspecific white matter type changes may reflect result of small vessel disease and/or treatment tumor.  Global atrophy without hydrocephalus.  Major intracranial vascular structures are patent.  Cervical medullary junction, pituitary region, pineal region and orbital structures unremarkable.  Minimal to mild paranasal sinus mucosal thickening.  No evidence of mesial temporal sclerosis.  IMPRESSION: On the 12/16/2013 contrast-enhanced brain MR, patient was noted to have 3 small intracranial metastatic lesions (right paracentral inferior vermis, medial left occipital lobe and posterior right frontal lobe). Evaluation of these intracranial metastatic lesions is limited on the present noncontrast motion degraded exam.  Mild vasogenic edema right paracentral/ superior vermis slightly more notable than on the prior MR may reflect result of underlying tumor and/or treatment of tumor.  Minimal edema at the level of the previously noted small medial left occipital lobe may reflect result of underlying tumor and/or treatment of tumor.  No area of restricted motion to suggest intracranial  metastatic disease or acute infarct.  Mild to moderate nonspecific white matter type changes may reflect result of small vessel disease and/or treatment tumor.  Global atrophy without hydrocephalus.   Electronically Signed   By: Genia Del M.D.   On: 04/24/2014 12:25   Nm Pulmonary Perf And Vent  04/19/2014   CLINICAL DATA:  Shortness of breath.  EXAM: NUCLEAR MEDICINE VENTILATION - PERFUSION LUNG SCAN  TECHNIQUE: Ventilation images were obtained in multiple projections using inhaled aerosol technetium 99 M DTPA. Perfusion images were obtained in multiple projections after intravenous injection of Tc-106m MAA.  RADIOPHARMACEUTICALS:  40.0 MCi Tc-65m DTPA aerosol and 5.0 mCi Tc-35m MAA  COMPARISON:  Chest x-ray 04/19/2014 and CT chest 03/04/2014  FINDINGS: Ventilation: Patchy heterogeneous  ventilation study with some linear defects likely corresponding to pleural effusions and lung disease.  Perfusion: Patchy heterogeneous perfusion scan matching the ventilation scan. No mismatching segmental or subsegmental defects to suggest pulmonary embolism.  IMPRESSION: Low probability ventilation perfusion lung scan for pulmonary embolism.   Electronically Signed   By: Marijo Sanes M.D.   On: 04/19/2014 19:10   US Abdomen Limited Ruq  04/23/2014   CLINICAL DATA:  Abnormal LFTs.  EXAM: US ABDOMEN LIMITED - RIGHT UPPER QUADRANT  COMPARISON:  None.  FINDINGS: Gallbladder:  Previous cholecystectomy.  Common bile duct:  Diameter: 2 mm  Liver:  No focal lesion identified. Within normal limits in parenchymal echogenicity.  Other:  Right pleural effusion noted.  IMPRESSION: 1. No findings to explain patient's elevated LFTs. 2. Prior cholecystectomy 3. Right pleural effusion   Electronically Signed   By: Kerby Moors M.D.   On: 04/23/2014 16:29     Microbiology: No results found for this or any previous visit (from the past 240 hour(s)).   Labs: Basic Metabolic Panel:  Recent Labs Lab 04/28/14 0430 04/29/14 0515  04/30/14 0923  NA 137 136 134*  K 4.3 4.4 3.9  CL 103 103 100  CO2 24 25 24   GLUCOSE 124* 122* 141*  BUN 55* 60* 64*  CREATININE 1.58* 1.60* 1.58*  CALCIUM 8.5 8.7 8.6   Liver Function Tests:  Recent Labs Lab 04/28/14 0430  AST 160*  ALT 233*  ALKPHOS 244*  BILITOT 1.9*  PROT 5.6*  ALBUMIN 2.2*   No results for input(s): LIPASE, AMYLASE in the last 168 hours. No results for input(s): AMMONIA in the last 168 hours. CBC:  Recent Labs Lab 04/27/14 0448 04/28/14 0430 04/29/14 0515 04/30/14 0923 05/01/14 0445 05/02/14 0555  WBC 1.3* 1.5* 1.6* 2.2* 2.7* 2.9*  NEUTROABS 1.1*  --   --   --   --   --   HGB 7.5* 7.5* 7.1* 8.7* 8.3* 8.5*  HCT 21.5* 21.7* 20.3* 25.6* 24.0* 24.5*  MCV 90.3 90.4 89.8 89.2 88.6 89.4  PLT 16* 8* 36* 23* 14* 10*   Cardiac Enzymes: No results for input(s): CKTOTAL, CKMB, CKMBINDEX, TROPONINI in the last 168 hours. BNP: Invalid input(s): POCBNP CBG: No results for input(s): GLUCAP in the last 168 hours.  Time coordinating discharge:  Greater than 30 minutes  Signed:  Faithlynn Deeley, DO [facility] Pager: [phone] 05/03/2014, 7:32 PM

## 2014-05-03 NOTE — Telephone Encounter (Signed)
VM message received from Case Manager stating :" Pt would like a call from Dr. Julien Nordmann regarding the direction she and the patient need to go. She has some questions she would like to speak with the MD about prior to the upcoming appt 4/19 with PA. She has spoken with the Nurse before with some of her concerns but would like to speak with the MD regarding her concerns." Mrs Danielski is asking if pt should go on hospice or how they should proceed. Will give message to provider.

## 2014-05-04 ENCOUNTER — Ambulatory Visit: Payer: Medicare Other

## 2014-05-04 ENCOUNTER — Ambulatory Visit: Payer: Medicare Other | Admitting: Physician Assistant

## 2014-05-04 ENCOUNTER — Telehealth: Payer: Self-pay | Admitting: Internal Medicine

## 2014-05-04 ENCOUNTER — Other Ambulatory Visit: Payer: Medicare Other

## 2014-05-04 NOTE — Telephone Encounter (Signed)
Called pt and spoke with wife, gave wife message that MD will speak with her and pt at [facility] appointment today, wife advised he will not be coming he's just  Not able to come today he is tired and I do not drive to [employer]."  Wife asked about pt status and vioiced she is concerned and confused, " I dont know what to expect" Discussed with wife I will review with MD and call her with additional information. Wife requested todays appoint be r/s for later in week. Informed pt I will request this change, however I do not know what the availability is for openings. Spoke with MD returned call to wife advised When MD saw pt in hospital he appeared tired, however without seeing the pt in office and having labs drawn he cannot determine how pt is doing. MD does not feel Hospice is in order at this time.  pof to scheduling

## 2014-05-04 NOTE — Telephone Encounter (Signed)
Spoke with patients wife and she is aware of 4/25 appointment

## 2014-05-07 ENCOUNTER — Encounter: Payer: Self-pay | Admitting: Radiation Oncology

## 2014-05-07 NOTE — Progress Notes (Signed)
I scheduled the patient to see me as an outpatient after discharge. This appointment has been canceled it is my understanding by the family. I would be happy to see him again in the future after he sees medical oncology if this is felt to be appropriate.

## 2014-05-09 ENCOUNTER — Other Ambulatory Visit: Payer: Self-pay | Admitting: Internal Medicine

## 2014-05-10 ENCOUNTER — Ambulatory Visit (HOSPITAL_BASED_OUTPATIENT_CLINIC_OR_DEPARTMENT_OTHER): Payer: Medicare Other | Admitting: Physician Assistant

## 2014-05-10 ENCOUNTER — Encounter: Payer: Self-pay | Admitting: Physician Assistant

## 2014-05-10 ENCOUNTER — Other Ambulatory Visit: Payer: Medicare Other

## 2014-05-10 ENCOUNTER — Ambulatory Visit (HOSPITAL_COMMUNITY)
Admission: RE | Admit: 2014-05-10 | Discharge: 2014-05-10 | Disposition: A | Discharge status: Home / Self Care | Payer: Medicare Other | Source: Ambulatory Visit | Attending: Internal Medicine | Admitting: Internal Medicine

## 2014-05-10 ENCOUNTER — Other Ambulatory Visit (HOSPITAL_BASED_OUTPATIENT_CLINIC_OR_DEPARTMENT_OTHER): Payer: Medicare Other

## 2014-05-10 ENCOUNTER — Ambulatory Visit (HOSPITAL_BASED_OUTPATIENT_CLINIC_OR_DEPARTMENT_OTHER): Payer: Medicare Other

## 2014-05-10 ENCOUNTER — Telehealth: Payer: Self-pay | Admitting: Physician Assistant

## 2014-05-10 VITALS — BP 119/68 | HR 98 | Temp 97.8°F | Resp 18 | Ht 71.0 in | Wt 145.1 lb

## 2014-05-10 DIAGNOSIS — T451X5A Adverse effect of antineoplastic and immunosuppressive drugs, initial encounter: Secondary | ICD-10-CM

## 2014-05-10 DIAGNOSIS — C3431 Malignant neoplasm of lower lobe, right bronchus or lung: Secondary | ICD-10-CM | POA: Diagnosis not present

## 2014-05-10 DIAGNOSIS — C3430 Malignant neoplasm of lower lobe, unspecified bronchus or lung: Secondary | ICD-10-CM | POA: Insufficient documentation

## 2014-05-10 DIAGNOSIS — Z95828 Presence of other vascular implants and grafts: Secondary | ICD-10-CM

## 2014-05-10 DIAGNOSIS — C7931 Secondary malignant neoplasm of brain: Secondary | ICD-10-CM | POA: Diagnosis not present

## 2014-05-10 DIAGNOSIS — D6481 Anemia due to antineoplastic chemotherapy: Secondary | ICD-10-CM

## 2014-05-10 LAB — CBC WITH DIFFERENTIAL/PLATELET
BASO%: 0 % (ref 0.0–2.0)
BASOS ABS: 0 10*3/uL (ref 0.0–0.1)
EOS%: 0 % (ref 0.0–7.0)
Eosinophils Absolute: 0 10*3/uL (ref 0.0–0.5)
HCT: 22 % — ABNORMAL LOW (ref 38.4–49.9)
HGB: 7.5 g/dL — ABNORMAL LOW (ref 13.0–17.1)
LYMPH#: 0.4 10*3/uL — AB (ref 0.9–3.3)
LYMPH%: 5.1 % — ABNORMAL LOW (ref 14.0–49.0)
MCH: 30 pg (ref 27.2–33.4)
MCHC: 34.1 g/dL (ref 32.0–36.0)
MCV: 88 fL (ref 79.3–98.0)
MONO#: 0.3 10*3/uL (ref 0.1–0.9)
MONO%: 3.8 % (ref 0.0–14.0)
NEUT%: 91.1 % — AB (ref 39.0–75.0)
NEUTROS ABS: 6.4 10*3/uL (ref 1.5–6.5)
PLATELETS: 84 10*3/uL — AB (ref 140–400)
RBC: 2.5 10*6/uL — ABNORMAL LOW (ref 4.20–5.82)
RDW: 15.7 % — ABNORMAL HIGH (ref 11.0–14.6)
WBC: 7.1 10*3/uL (ref 4.0–10.3)

## 2014-05-10 LAB — COMPREHENSIVE METABOLIC PANEL (CC13)
ALT: 53 U/L (ref 0–55)
AST: 33 U/L (ref 5–34)
Albumin: 2.5 g/dL — ABNORMAL LOW (ref 3.5–5.0)
Alkaline Phosphatase: 228 U/L — ABNORMAL HIGH (ref 40–150)
Anion Gap: 13 mEq/L — ABNORMAL HIGH (ref 3–11)
BUN: 32.3 mg/dL — AB (ref 7.0–26.0)
CHLORIDE: 102 meq/L (ref 98–109)
CO2: 21 mEq/L — ABNORMAL LOW (ref 22–29)
Calcium: 8 mg/dL — ABNORMAL LOW (ref 8.4–10.4)
Creatinine: 1.8 mg/dL — ABNORMAL HIGH (ref 0.7–1.3)
EGFR: 36 mL/min/{1.73_m2} — AB (ref >90)
Glucose: 118 mg/dl (ref 70–140)
Potassium: 4 mEq/L (ref 3.5–5.1)
Sodium: 136 mEq/L (ref 136–145)
TOTAL PROTEIN: 5.8 g/dL — AB (ref 6.4–8.3)
Total Bilirubin: 0.54 mg/dL (ref 0.20–1.20)

## 2014-05-10 LAB — HOLD TUBE, BLOOD BANK

## 2014-05-10 MED ORDER — SODIUM CHLORIDE 0.9 % IJ SOLN
10.0000 mL | INTRAMUSCULAR | Status: DC | PRN
  Administered 2014-05-10: 10 mL via INTRAVENOUS
  Filled 2014-05-10: qty 10

## 2014-05-10 MED ORDER — HEPARIN SOD (PORK) LOCK FLUSH 100 UNIT/ML IV SOLN
500.0000 [IU] | Freq: Once | INTRAVENOUS | Status: AC
  Administered 2014-05-10: 500 [IU] via INTRAVENOUS
  Filled 2014-05-10: qty 5

## 2014-05-10 NOTE — Telephone Encounter (Signed)
Gave avs & calendar for June. Sent message to schedule treatment. °

## 2014-05-10 NOTE — Progress Notes (Addendum)
[facility] [phone]   [phone]  OFFICE PROGRESS NOTE  [name], [place] [address]  DIAGNOSIS: Stage IV ( T2b, N2, M1b) non-small cell lung cancer, adenocarcinoma with areas of squamous differentiation with negative EGFR mutation and negative ALK gene translocation, presented with left lower lobe lung mass in addition to mediastinal lymphadenopathy and brain metastases diagnosed in October of 2014  PRIOR THERAPY: 1) Stereotactic radiotherapy to 3 brain lesions under the care of Dr. Lisbeth Renshaw completed 12/08/2012. 2) Palliative radiotherapy to the left lower lobe lung mass under the care of Dr. Lisbeth Renshaw expected to be completed 01/01/2013. 3) Systemic chemotherapy with carboplatin for AUC of 5 and Alimta 500 mg/M2 every 3 weeks. First dose 12/23/2012. Status post 6 cycles  CURRENT THERAPY: Maintenance chemotherapy with single agent Alimta at 500 mg per meter squared given every 3 weeks. First cycle expected to be given 05/12/2013. Status post 16 cycles. Starting with cycle #15, Alimta was reduced to 400 mg/M2   CHEMOTHERAPY INTENT: Palliative  CURRENT # OF CHEMOTHERAPY CYCLES: 16  CURRENT ANTIEMETICS:Zofran, dexamethasone and Compazine  CURRENT SMOKING STATUS: former smoker  ORAL CHEMOTHERAPY AND CONSENT: None  CURRENT BISPHOSPHONATES USE: None  PAIN MANAGEMENT: 0/10  NARCOTICS INDUCED CONSTIPATION: None  LIVING WILL AND CODE STATUS: ?  INTERVAL HISTORY: [name] 81 y.o. male returns to the clinic today for follow up visit accompanied by his wife and son. He has been on maintenance chemotherapy with single agent Alimta for the last 16 cycles. Has been tolerating his treatment fairly well except for the last few cycles which have been complicated with pneumonia and pancytopenia. He is feeling very tired today. The patient denied having any nausea or vomiting. He denied having any fever or chills. He has no  chest pain, shortness of breath, cough or hemoptysis. He has no weight loss or night sweats. He denied having any significant back pain. He presents to proceed with cycle #17 but does not feel up to it.  MEDICAL HISTORY: Past Medical History  Diagnosis Date  . COPD (chronic obstructive pulmonary disease)   . Allergy     allergic rhinitis  . Hx of radiation therapy 12/09/12-01/01/13    lung 37.5Gy  . nscl ca w/ brain mets dx'd 08/2012    Patient has a lung mass which is being evaluated    ALLERGIES:  is allergic to asa and sulfa antibiotics.  MEDICATIONS:  Current Outpatient Prescriptions  Medication Sig Dispense Refill  . acetaminophen (TYLENOL) 500 MG tablet Take 500 mg by mouth every 6 (six) hours as needed for mild pain.     Marland Kitchen Alum & Mag Hydroxide-Simeth (MAGIC MOUTHWASH) SOLN Take 5 mLs by mouth 3 (three) times daily as needed for mouth pain.    . clobetasol cream (TEMOVATE) 2.09 % Apply 1 application topically daily as needed (for itching).    Marland Kitchen dexamethasone (DECADRON) 4 MG tablet Take 1 tablet (4 mg total) by mouth 2 (two) times daily. 60 tablet 0  . feeding supplement, ENSURE COMPLETE, (ENSURE COMPLETE) LIQD Take 237 mLs by mouth 3 (three) times daily between meals.    . folic acid (FOLVITE) 470 MCG tablet Take 400 mcg by mouth every morning.    . furosemide (LASIX) 20 MG tablet Take 1 tablet by mouth daily.    . furosemide (LASIX) 20 MG tablet TAKE 1 TABLET BY MOUTH DAILY AS NEEDED FOR SWELLING OF THE LOWER EXTREMITIES. 30 tablet 0  .  levETIRAcetam (KEPPRA) 500 MG tablet Take 1 tablet (500 mg total) by mouth 2 (two) times daily. 60 tablet 1  . megestrol (MEGACE) 40 MG tablet Take 1 tablet (40 mg total) by mouth daily. 30 tablet 0  . Polyethyl Glycol-Propyl Glycol (SYSTANE OP) Place 1 drop into both eyes daily.    . simvastatin (ZOCOR) 10 MG tablet Take 10 mg by mouth at bedtime.    . ferrous sulfate (IRON SUPPLEMENT) 325 (65 FE) MG tablet Take 325 mg by mouth daily with  breakfast.     No current facility-administered medications for this visit.    SURGICAL HISTORY:  Past Surgical History  Procedure Laterality Date  . Cholecystectomy      REVIEW OF SYSTEMS:  A comprehensive review of systems was negative except for: Constitutional: positive for anorexia and fatigue Swelling of the lower extremities   PHYSICAL EXAMINATION: General appearance: alert, cooperative and no distress Head: Normocephalic, without obvious abnormality, atraumatic Neck: no adenopathy, no JVD, supple, symmetrical, trachea midline and thyroid not enlarged, symmetric, no tenderness/mass/nodules Lymph nodes: Cervical, supraclavicular, and axillary nodes normal. Resp: clear to auscultation bilaterally Back: symmetric, no curvature. ROM normal. No CVA tenderness. Cardio: regular rate and rhythm, S1, S2 normal, no murmur, click, rub or gallop GI: soft, non-tender; bowel sounds normal; no masses,  no organomegaly Extremities: edema 2+ Neurologic: Alert and oriented X 3, normal strength and tone. Normal symmetric reflexes. Normal coordination and gait  ECOG PERFORMANCE STATUS: 1 - Symptomatic but completely ambulatory  Blood pressure 119/68, pulse 98, temperature 97.8 F (36.6 C), temperature source Oral, resp. rate 18, height $RemoveBe'5\' 11"'WLYPiVYLA$  (1.803 m), weight 145 lb 1.6 oz (65.817 kg), SpO2 100 %.  LABORATORY DATA: Lab Results  Component Value Date   WBC 7.1 05/10/2014   HGB 7.5* 05/10/2014   HCT 22.0* 05/10/2014   MCV 88.0 05/10/2014   PLT 84* 05/10/2014      Chemistry      Component Value Date/Time   NA 136 05/10/2014 1416   NA 134* 04/30/2014 0923   NA CANCELED 10/13/2012 1149   K 4.0 05/10/2014 1416   K 3.9 04/30/2014 0923   CL 100 04/30/2014 0923   CO2 21* 05/10/2014 1416   CO2 24 04/30/2014 0923   BUN 32.3* 05/10/2014 1416   BUN 64* 04/30/2014 0923   BUN CANCELED 10/13/2012 1149   CREATININE 1.8* 05/10/2014 1416   CREATININE 1.58* 04/30/2014 0923      Component  Value Date/Time   CALCIUM 8.0* 05/10/2014 1416   CALCIUM 8.6 04/30/2014 0923   ALKPHOS 228* 05/10/2014 1416   ALKPHOS 244* 04/28/2014 0430   AST 33 05/10/2014 1416   AST 160* 04/28/2014 0430   ALT 53 05/10/2014 1416   ALT 233* 04/28/2014 0430   BILITOT 0.54 05/10/2014 1416   BILITOT 1.9* 04/28/2014 0430       RADIOGRAPHIC STUDIES: Dg Chest 2 View  04/19/2014   CLINICAL DATA:  Shortness of breath, leg swelling, lung cancer, initial encounter.  EXAM: CHEST  2 VIEW  COMPARISON:  03/06/2014 and CT chest 03/04/2014.  FINDINGS: Trachea is midline. Heart size stable. Right IJ power port tip is in the high right atrium. Right perihilar opacification and architectural distortion appear grossly stable. Small right pleural effusion with volume loss at the base of the right hemi thorax. Reticular coarsening in the periphery of the left hemi thorax. No left pleural fluid.  IMPRESSION: 1. Right perihilar masslike opacification with architectural distortion, unchanged from 03/06/2014 and better interrogated on  cross-sectional imaging 03/04/2014. 2. Small right pleural effusion, possibly increased. 3. Subpleural reticulation, indicative of fibrosis.   Electronically Signed   By: Lorin Picket M.D.   On: 04/19/2014 16:10   Ct Head Wo Contrast  04/23/2014   CLINICAL DATA:  New seizure activity  EXAM: CT HEAD WITHOUT CONTRAST  TECHNIQUE: Contiguous axial images were obtained from the base of the skull through the vertex without intravenous contrast.  COMPARISON:  None.  FINDINGS: Bony calvarium is intact. Mild atrophic changes are noted. Basal ganglia calcifications are seen. No findings to suggest acute hemorrhage, acute infarction or space-occupying mass lesion are noted.  IMPRESSION: Chronic atrophic changes without acute abnormality.   Electronically Signed   By: Inez Catalina M.D.   On: 04/23/2014 13:11   Mr Brain Wo Contrast  04/24/2014   CLINICAL DATA:  81 year old male with history of metastatic non small  cell lung cancer with brain metastases treated with stereotactic radiation therapy. Currently undergoing systemic maintenance chemotherapy. Weakness seizure 04/23/2014. Subsequent encounter.  EXAM: MRI HEAD WITHOUT CONTRAST  TECHNIQUE: Multiplanar, multiecho pulse sequences of the brain and surrounding structures were obtained without intravenous contrast.  COMPARISON:  04/23/2014 head CT.  12/16/2013 brain MR.  FINDINGS: On the 12/16/2013 contrast-enhanced brain MR, patient was noted to have 3 small intracranial metastatic lesions (right paracentral inferior vermis, medial left occipital lobe and posterior right frontal lobe). Evaluation of these intracranial metastatic lesions is limited on the present noncontrast motion degraded exam.  Mild vasogenic edema right paracentral/ superior vermis slightly more notable than on the prior MR may reflect result of underlying tumor and/or treatment of tumor.  Minimal edema at the level of the previously noted small medial left occipital lobe may reflect result of underlying tumor and/or treatment of tumor.  No area of restricted motion to suggest intracranial metastatic disease or acute infarct.  Mild to moderate nonspecific white matter type changes may reflect result of small vessel disease and/or treatment tumor.  Global atrophy without hydrocephalus.  Major intracranial vascular structures are patent.  Cervical medullary junction, pituitary region, pineal region and orbital structures unremarkable.  Minimal to mild paranasal sinus mucosal thickening.  No evidence of mesial temporal sclerosis.  IMPRESSION: On the 12/16/2013 contrast-enhanced brain MR, patient was noted to have 3 small intracranial metastatic lesions (right paracentral inferior vermis, medial left occipital lobe and posterior right frontal lobe). Evaluation of these intracranial metastatic lesions is limited on the present noncontrast motion degraded exam.  Mild vasogenic edema right paracentral/ superior  vermis slightly more notable than on the prior MR may reflect result of underlying tumor and/or treatment of tumor.  Minimal edema at the level of the previously noted small medial left occipital lobe may reflect result of underlying tumor and/or treatment of tumor.  No area of restricted motion to suggest intracranial metastatic disease or acute infarct.  Mild to moderate nonspecific white matter type changes may reflect result of small vessel disease and/or treatment tumor.  Global atrophy without hydrocephalus.   Electronically Signed   By: Genia Del M.D.   On: 04/24/2014 12:25   Nm Pulmonary Perf And Vent  04/19/2014   CLINICAL DATA:  Shortness of breath.  EXAM: NUCLEAR MEDICINE VENTILATION - PERFUSION LUNG SCAN  TECHNIQUE: Ventilation images were obtained in multiple projections using inhaled aerosol technetium 99 M DTPA. Perfusion images were obtained in multiple projections after intravenous injection of Tc-23m MAA.  RADIOPHARMACEUTICALS:  40.0 MCi Tc-21m DTPA aerosol and 5.0 mCi Tc-36m MAA  COMPARISON:  Chest  x-ray 04/19/2014 and CT chest 03/04/2014  FINDINGS: Ventilation: Patchy heterogeneous ventilation study with some linear defects likely corresponding to pleural effusions and lung disease.  Perfusion: Patchy heterogeneous perfusion scan matching the ventilation scan. No mismatching segmental or subsegmental defects to suggest pulmonary embolism.  IMPRESSION: Low probability ventilation perfusion lung scan for pulmonary embolism.   Electronically Signed   By: Marijo Sanes M.D.   On: 04/19/2014 19:10   US Abdomen Limited Ruq  04/23/2014   CLINICAL DATA:  Abnormal LFTs.  EXAM: US ABDOMEN LIMITED - RIGHT UPPER QUADRANT  COMPARISON:  None.  FINDINGS: Gallbladder:  Previous cholecystectomy.  Common bile duct:  Diameter: 2 mm  Liver:  No focal lesion identified. Within normal limits in parenchymal echogenicity.  Other:  Right pleural effusion noted.  IMPRESSION: 1. No findings to explain patient's  elevated LFTs. 2. Prior cholecystectomy 3. Right pleural effusion   Electronically Signed   By: Kerby Moors M.D.   On: 04/23/2014 16:29   ASSESSMENT AND PLAN: this is a very pleasant 81 years old white male with metastatic non-small cell lung cancer, adenocarcinoma with brain metastasis status post stereotactic radiotherapy to the brain lesions followed by palliative radiotherapy to the left lower lobe lung mass. The patient is currently undergoing systemic chemotherapy with carboplatin and Alimta status post 6 cycles with stable disease and he is currently undergoing maintenance chemotherapy with single agent Alimta status post 16 cycles. The patient  Was discussed with and also seen by Dr. Julien Nordmann. He is anemic today with a hemoglobin of 7.5 gm/dL. We will arrange for him to receive 2 units of PRBCs to address his symptomatic chemotherapy induced anemia. Additinally as he is having increasing issues/side effects from the maintenance Alimta, we will discontinue this treatment at this time. He will return in one month with a restaging CT scan of the chest, abdomen and pelvis with contrast to re-evaluate his disease.   He was advised to call immediately if he has any concerning symptoms in the interval. The patient voices understanding of current disease status and treatment options and is in agreement with the current care plan.  All questions were answered. The patient knows to call the clinic with any problems, questions or concerns. We can certainly see the patient much sooner if necessary.  Carlton Adam, PA-C 05/10/2014     ADDENDUM:  Hematology/Oncology Attending:  I had a face to face encounter with the patient. I recommended his care plan. This is a very pleasant 81 years old white male with a stage IV non-small cell lung cancer status post induction chemotherapy with carboplatin and Alimta followed by maintenance chemotherapy with single agent Alimta status post 16 cycles. The last  few cycles the patient had significant pancytopenia following his chemotherapy and he has to be admitted to [facility] for evaluation and treatment of his condition. He is recovering well from the recent hospitalization. I had a lengthy discussion with the patient his family today about his condition. I discussed with the patient and his family several options including close monitoring and observation versus treatment with immunotherapy with Nivolumab. The patient has family agreed to continue on observation for now. I would see him back for follow-up visit in one month's after repeating CT scan of the chest, abdomen and pelvis for reevaluation of his disease. For the chemotherapy-induced anemia, will arrange for the patient to receive 2 units of PRBCs transfusion tomorrow. He was advised to call immediately if he has any concerning symptoms  in the interval.  Disclaimer: This note was dictated with voice recognition software. Similar sounding words can inadvertently be transcribed and may be missed upon review. Eilleen Kempf., MD 05/15/2014

## 2014-05-10 NOTE — Patient Instructions (Signed)

## 2014-05-11 ENCOUNTER — Telehealth: Payer: Self-pay | Admitting: Internal Medicine

## 2014-05-11 NOTE — Telephone Encounter (Signed)
s.w. pt wife and advised on 4.27 appt...ok and aware

## 2014-05-12 ENCOUNTER — Ambulatory Visit (HOSPITAL_BASED_OUTPATIENT_CLINIC_OR_DEPARTMENT_OTHER): Payer: Medicare Other

## 2014-05-12 VITALS — BP 123/67 | HR 77 | Temp 97.0°F | Resp 18

## 2014-05-12 DIAGNOSIS — C3430 Malignant neoplasm of lower lobe, unspecified bronchus or lung: Secondary | ICD-10-CM | POA: Diagnosis not present

## 2014-05-12 DIAGNOSIS — D6481 Anemia due to antineoplastic chemotherapy: Secondary | ICD-10-CM | POA: Diagnosis not present

## 2014-05-12 DIAGNOSIS — T451X5A Adverse effect of antineoplastic and immunosuppressive drugs, initial encounter: Principal | ICD-10-CM

## 2014-05-12 LAB — PREPARE RBC (CROSSMATCH)

## 2014-05-12 MED ORDER — HEPARIN SOD (PORK) LOCK FLUSH 100 UNIT/ML IV SOLN
250.0000 [IU] | INTRAVENOUS | Status: DC | PRN
  Filled 2014-05-12: qty 5

## 2014-05-12 MED ORDER — SODIUM CHLORIDE 0.9 % IJ SOLN
3.0000 mL | INTRAMUSCULAR | Status: DC | PRN
  Filled 2014-05-12: qty 10

## 2014-05-12 MED ORDER — SODIUM CHLORIDE 0.9 % IJ SOLN
10.0000 mL | INTRAMUSCULAR | Status: AC | PRN
  Administered 2014-05-12: 10 mL
  Filled 2014-05-12: qty 10

## 2014-05-12 MED ORDER — HEPARIN SOD (PORK) LOCK FLUSH 100 UNIT/ML IV SOLN
500.0000 [IU] | Freq: Every day | INTRAVENOUS | Status: AC | PRN
  Administered 2014-05-12: 500 [IU]
  Filled 2014-05-12: qty 5

## 2014-05-12 NOTE — Patient Instructions (Signed)

## 2014-05-13 LAB — TYPE AND SCREEN
ABO/RH(D): O POS
ANTIBODY SCREEN: NEGATIVE
Unit division: 0
Unit division: 0

## 2014-05-13 NOTE — Patient Instructions (Signed)
Your maintenance chemotherapy is being discontinued secondary to your intolerance and loww blood counts Return as scheduled for a blood transfusion to address your symptomatic chemotherapy induced anemia Follow up in 1 month with a restaging CT scan of your chest, abdomen and pelvis to re-evaluate your disease

## 2014-05-25 ENCOUNTER — Ambulatory Visit: Payer: Medicare Other | Admitting: Internal Medicine

## 2014-05-25 ENCOUNTER — Ambulatory Visit: Payer: Medicare Other

## 2014-05-25 ENCOUNTER — Other Ambulatory Visit: Payer: Medicare Other

## 2014-06-11 ENCOUNTER — Encounter (HOSPITAL_COMMUNITY): Payer: Self-pay

## 2014-06-11 ENCOUNTER — Ambulatory Visit (HOSPITAL_COMMUNITY)
Admission: RE | Admit: 2014-06-11 | Discharge: 2014-06-11 | Disposition: A | Discharge status: Home / Self Care | Payer: Medicare Other | Source: Ambulatory Visit | Attending: Physician Assistant | Admitting: Physician Assistant

## 2014-06-11 DIAGNOSIS — Z9221 Personal history of antineoplastic chemotherapy: Secondary | ICD-10-CM | POA: Diagnosis not present

## 2014-06-11 DIAGNOSIS — Z85118 Personal history of other malignant neoplasm of bronchus and lung: Secondary | ICD-10-CM | POA: Insufficient documentation

## 2014-06-11 DIAGNOSIS — I313 Pericardial effusion (noninflammatory): Secondary | ICD-10-CM | POA: Diagnosis not present

## 2014-06-11 DIAGNOSIS — C3431 Malignant neoplasm of lower lobe, right bronchus or lung: Secondary | ICD-10-CM | POA: Diagnosis not present

## 2014-06-11 DIAGNOSIS — I2510 Atherosclerotic heart disease of native coronary artery without angina pectoris: Secondary | ICD-10-CM | POA: Diagnosis not present

## 2014-06-11 DIAGNOSIS — J439 Emphysema, unspecified: Secondary | ICD-10-CM | POA: Diagnosis not present

## 2014-06-11 DIAGNOSIS — Z85841 Personal history of malignant neoplasm of brain: Secondary | ICD-10-CM | POA: Insufficient documentation

## 2014-06-11 DIAGNOSIS — C7931 Secondary malignant neoplasm of brain: Secondary | ICD-10-CM | POA: Diagnosis not present

## 2014-06-11 DIAGNOSIS — Z923 Personal history of irradiation: Secondary | ICD-10-CM | POA: Insufficient documentation

## 2014-06-11 DIAGNOSIS — J90 Pleural effusion, not elsewhere classified: Secondary | ICD-10-CM | POA: Diagnosis not present

## 2014-06-11 MED ORDER — IOHEXOL 300 MG/ML  SOLN
80.0000 mL | Freq: Once | INTRAMUSCULAR | Status: AC | PRN
  Administered 2014-06-11: 80 mL via INTRAVENOUS

## 2014-06-17 ENCOUNTER — Ambulatory Visit (HOSPITAL_BASED_OUTPATIENT_CLINIC_OR_DEPARTMENT_OTHER): Payer: Medicare Other

## 2014-06-17 ENCOUNTER — Encounter: Payer: Self-pay | Admitting: Internal Medicine

## 2014-06-17 ENCOUNTER — Other Ambulatory Visit (HOSPITAL_BASED_OUTPATIENT_CLINIC_OR_DEPARTMENT_OTHER): Payer: Medicare Other

## 2014-06-17 ENCOUNTER — Ambulatory Visit (HOSPITAL_BASED_OUTPATIENT_CLINIC_OR_DEPARTMENT_OTHER): Payer: Medicare Other | Admitting: Internal Medicine

## 2014-06-17 ENCOUNTER — Telehealth: Payer: Self-pay | Admitting: Internal Medicine

## 2014-06-17 VITALS — BP 136/73 | HR 78 | Temp 97.6°F | Resp 17 | Ht 71.0 in | Wt 154.3 lb

## 2014-06-17 DIAGNOSIS — C7931 Secondary malignant neoplasm of brain: Secondary | ICD-10-CM | POA: Diagnosis not present

## 2014-06-17 DIAGNOSIS — Z95828 Presence of other vascular implants and grafts: Secondary | ICD-10-CM

## 2014-06-17 DIAGNOSIS — C3431 Malignant neoplasm of lower lobe, right bronchus or lung: Secondary | ICD-10-CM

## 2014-06-17 DIAGNOSIS — D61818 Other pancytopenia: Secondary | ICD-10-CM

## 2014-06-17 LAB — CBC WITH DIFFERENTIAL/PLATELET
BASO%: 0 % (ref 0.0–2.0)
BASOS ABS: 0 10*3/uL (ref 0.0–0.1)
EOS%: 0.1 % (ref 0.0–7.0)
Eosinophils Absolute: 0 10*3/uL (ref 0.0–0.5)
HCT: 24.3 % — ABNORMAL LOW (ref 38.4–49.9)
HEMOGLOBIN: 8.1 g/dL — AB (ref 13.0–17.1)
LYMPH%: 4 % — AB (ref 14.0–49.0)
MCH: 30.7 pg (ref 27.2–33.4)
MCHC: 33.3 g/dL (ref 32.0–36.0)
MCV: 92 fL (ref 79.3–98.0)
MONO#: 0.3 10*3/uL (ref 0.1–0.9)
MONO%: 4.5 % (ref 0.0–14.0)
NEUT#: 6.9 10*3/uL — ABNORMAL HIGH (ref 1.5–6.5)
NEUT%: 91.4 % — AB (ref 39.0–75.0)
Platelets: 49 10*3/uL — ABNORMAL LOW (ref 140–400)
RBC: 2.64 10*6/uL — AB (ref 4.20–5.82)
RDW: 19.9 % — AB (ref 11.0–14.6)
WBC: 7.6 10*3/uL (ref 4.0–10.3)
lymph#: 0.3 10*3/uL — ABNORMAL LOW (ref 0.9–3.3)

## 2014-06-17 LAB — COMPREHENSIVE METABOLIC PANEL (CC13)
ALK PHOS: 140 U/L (ref 40–150)
ALT: 22 U/L (ref 0–55)
AST: 22 U/L (ref 5–34)
Albumin: 2.5 g/dL — ABNORMAL LOW (ref 3.5–5.0)
Anion Gap: 9 mEq/L (ref 3–11)
BILIRUBIN TOTAL: 0.68 mg/dL (ref 0.20–1.20)
BUN: 35.9 mg/dL — AB (ref 7.0–26.0)
CALCIUM: 8.3 mg/dL — AB (ref 8.4–10.4)
CO2: 25 meq/L (ref 22–29)
CREATININE: 2 mg/dL — AB (ref 0.7–1.3)
Chloride: 104 mEq/L (ref 98–109)
EGFR: 32 mL/min/{1.73_m2} — ABNORMAL LOW (ref >90)
Glucose: 101 mg/dl (ref 70–140)
POTASSIUM: 4.3 meq/L (ref 3.5–5.1)
SODIUM: 138 meq/L (ref 136–145)
Total Protein: 5.8 g/dL — ABNORMAL LOW (ref 6.4–8.3)

## 2014-06-17 MED ORDER — SODIUM CHLORIDE 0.9 % IJ SOLN
10.0000 mL | INTRAMUSCULAR | Status: DC | PRN
  Administered 2014-06-17: 10 mL via INTRAVENOUS
  Filled 2014-06-17: qty 10

## 2014-06-17 MED ORDER — HEPARIN SOD (PORK) LOCK FLUSH 100 UNIT/ML IV SOLN
500.0000 [IU] | Freq: Once | INTRAVENOUS | Status: AC
  Administered 2014-06-17: 500 [IU] via INTRAVENOUS
  Filled 2014-06-17: qty 5

## 2014-06-17 NOTE — Patient Instructions (Signed)

## 2014-06-17 NOTE — Telephone Encounter (Signed)
Gave and printed appt sched and avs for pt for SEpt....gv baroum

## 2014-06-17 NOTE — Progress Notes (Signed)
[facility] Telephone:([phone]) [phone]   Fax:([phone]) [phone]  OFFICE PROGRESS NOTE  [name], [place] [address]  DIAGNOSIS: Stage IV ( T2b, N2, M1b) non-small cell lung cancer, adenocarcinoma with areas of squamous differentiation with negative EGFR mutation and negative ALK gene translocation, presented with left lower lobe lung mass in addition to mediastinal lymphadenopathy and brain metastases diagnosed in October of 2014  PRIOR THERAPY: 1) Stereotactic radiotherapy to 3 brain lesions under the care of Dr. Lisbeth Renshaw completed 12/08/2012. 2) Palliative radiotherapy to the left lower lobe lung mass under the care of Dr. Lisbeth Renshaw expected to be completed 01/01/2013. 3) Systemic chemotherapy with carboplatin for AUC of 5 and Alimta 500 mg/M2 every 3 weeks. First dose 12/23/2012. Status post 6 cycles  CURRENT THERAPY: Maintenance chemotherapy with single agent Alimta at 500 mg per meter squared given every 3 weeks. First cycle expected to be given 05/12/2013. Status post 16 cycles discontinued secondary to pancytopenia and intolerance.   CHEMOTHERAPY INTENT: Palliative  CURRENT # OF CHEMOTHERAPY CYCLES: 16  CURRENT ANTIEMETICS:Zofran, dexamethasone and Compazine  CURRENT SMOKING STATUS: former smoker  ORAL CHEMOTHERAPY AND CONSENT: None  CURRENT BISPHOSPHONATES USE: None  PAIN MANAGEMENT: 0/10  NARCOTICS INDUCED CONSTIPATION: None  LIVING WILL AND CODE STATUS: ?  INTERVAL HISTORY: [name] 81 y.o. male returns to the clinic today for follow up visit accompanied by his wife and daughter. The patient has been observation for the last 2 months. He is feeling a little bit better but continues to have episodes of fatigue and weakness. He was able to work in his yard yesterday and mowing grass. The patient denied having any nausea or vomiting. He denied having any fever or chills. He has no chest pain, shortness of breath, cough or hemoptysis.  He has no weight loss or night sweats. He denied having any significant back pain. He was found recently to have 3 small metastatic brain lesions and the patient is followed by Dr. Lisbeth Renshaw but has not received treatment to these lesion as his appointment was canceled by the family. He had repeat CT scan of the chest, abdomen and pelvis performed recently and he is here for evaluation and discussion of his scan results.  MEDICAL HISTORY: Past Medical History  Diagnosis Date  . COPD (chronic obstructive pulmonary disease)   . Allergy     allergic rhinitis  . Hx of radiation therapy 12/09/12-01/01/13    lung 37.5Gy  . nscl ca w/ brain mets dx'd 08/2012    Patient has a lung mass which is being evaluated    ALLERGIES:  is allergic to asa and sulfa antibiotics.  MEDICATIONS:  Current Outpatient Prescriptions  Medication Sig Dispense Refill  . acetaminophen (TYLENOL) 500 MG tablet Take 500 mg by mouth every 6 (six) hours as needed for mild pain.     Marland Kitchen Alum & Mag Hydroxide-Simeth (MAGIC MOUTHWASH) SOLN Take 5 mLs by mouth 3 (three) times daily as needed for mouth pain.    . clobetasol cream (TEMOVATE) 9.24 % Apply 1 application topically daily as needed (for itching).    Marland Kitchen dexamethasone (DECADRON) 4 MG tablet Take 1 tablet (4 mg total) by mouth 2 (two) times daily. 60 tablet 0  . feeding supplement, ENSURE COMPLETE, (ENSURE COMPLETE) LIQD Take 237 mLs by mouth 3 (three) times daily between meals.    . ferrous sulfate (IRON SUPPLEMENT) 325 (65 FE) MG tablet Take 325 mg by mouth daily with  breakfast.    . folic acid (FOLVITE) 161 MCG tablet Take 400 mcg by mouth every morning.    . furosemide (LASIX) 20 MG tablet Take 1 tablet by mouth daily.    . furosemide (LASIX) 20 MG tablet TAKE 1 TABLET BY MOUTH DAILY AS NEEDED FOR SWELLING OF THE LOWER EXTREMITIES. 30 tablet 0  . levETIRAcetam (KEPPRA) 500 MG tablet Take 1 tablet (500 mg total) by mouth 2 (two) times daily. 60 tablet 1  . megestrol (MEGACE)  40 MG tablet Take 1 tablet (40 mg total) by mouth daily. 30 tablet 0  . Polyethyl Glycol-Propyl Glycol (SYSTANE OP) Place 1 drop into both eyes daily.    . simvastatin (ZOCOR) 10 MG tablet Take 10 mg by mouth at bedtime.     No current facility-administered medications for this visit.   Facility-Administered Medications Ordered in Other Visits  Medication Dose Route Frequency Provider Last Rate Last Dose  . sodium chloride 0.9 % injection 10 mL  10 mL Intravenous PRN Curt Bears, MD   10 mL at 06/17/14 1140    SURGICAL HISTORY:  Past Surgical History  Procedure Laterality Date  . Cholecystectomy      REVIEW OF SYSTEMS:  A comprehensive review of systems was negative except for: Constitutional: positive for anorexia and fatigue Swelling of the lower extremities   PHYSICAL EXAMINATION: General appearance: alert, cooperative and no distress Head: Normocephalic, without obvious abnormality, atraumatic Neck: no adenopathy, no JVD, supple, symmetrical, trachea midline and thyroid not enlarged, symmetric, no tenderness/mass/nodules Lymph nodes: Cervical, supraclavicular, and axillary nodes normal. Resp: clear to auscultation bilaterally Back: symmetric, no curvature. ROM normal. No CVA tenderness. Cardio: regular rate and rhythm, S1, S2 normal, no murmur, click, rub or gallop GI: soft, non-tender; bowel sounds normal; no masses,  no organomegaly Extremities: edema 2+ Neurologic: Alert and oriented X 3, normal strength and tone. Normal symmetric reflexes. Normal coordination and gait  ECOG PERFORMANCE STATUS: 1 - Symptomatic but completely ambulatory  Blood pressure 136/73, pulse 78, temperature 97.6 F (36.4 C), temperature source Oral, resp. rate 17, height $RemoveBe'5\' 11"'gCMIaQkdz$  (1.803 m), weight 154 lb 4.8 oz (69.99 kg), SpO2 100 %.  LABORATORY DATA: Lab Results  Component Value Date   WBC 7.6 06/17/2014   HGB 8.1* 06/17/2014   HCT 24.3* 06/17/2014   MCV 92.0 06/17/2014   PLT 49*  06/17/2014      Chemistry      Component Value Date/Time   NA 136 05/10/2014 1416   NA 134* 04/30/2014 0923   NA CANCELED 10/13/2012 1149   K 4.0 05/10/2014 1416   K 3.9 04/30/2014 0923   CL 100 04/30/2014 0923   CO2 21* 05/10/2014 1416   CO2 24 04/30/2014 0923   BUN 32.3* 05/10/2014 1416   BUN 64* 04/30/2014 0923   BUN CANCELED 10/13/2012 1149   CREATININE 1.8* 05/10/2014 1416   CREATININE 1.58* 04/30/2014 0923      Component Value Date/Time   CALCIUM 8.0* 05/10/2014 1416   CALCIUM 8.6 04/30/2014 0923   ALKPHOS 228* 05/10/2014 1416   ALKPHOS 244* 04/28/2014 0430   AST 33 05/10/2014 1416   AST 160* 04/28/2014 0430   ALT 53 05/10/2014 1416   ALT 233* 04/28/2014 0430   BILITOT 0.54 05/10/2014 1416   BILITOT 1.9* 04/28/2014 0430       RADIOGRAPHIC STUDIES: Ct Chest W Contrast  06/11/2014   CLINICAL DATA:  Non small cell lung cancer with brain metastasis diagnosed in August 2014. Chemotherapy completed March  2016. Radiation therapy completed.  EXAM: CT CHEST, ABDOMEN, AND PELVIS WITH CONTRAST  TECHNIQUE: Multidetector CT imaging of the chest, abdomen and pelvis was performed following the standard protocol during bolus administration of intravenous contrast.  CONTRAST:  40mL OMNIPAQUE IOHEXOL 300 MG/ML  SOLN  COMPARISON:  Chest CT 03/04/2014  FINDINGS: CT CHEST FINDINGS  Chest wall: Stable right-sided Port-A-Cath. No supraclavicular or axillary lymphadenopathy. The thyroid gland is grossly normal. The bony thorax is intact. No destructive bone lesions or spinal canal compromise. Stable T10 compression deformity.  Mediastinum: The heart is normal in size. Small pericardial effusion is stable. No enlarged mediastinal or hilar lymph nodes. Small scattered nodes are stable. The aorta demonstrates stable atherosclerotic calcifications. No dissection. The esophagus is grossly normal.  Lungs/pleura: The right-sided pleural effusion is slightly smaller. Stable emphysematous changes and  extensive radiation changes involving the right lower lobe paramediastinal lung. Stable right lower lobe pulmonary nodule which measures 17 x 17 mm on image number 30. It measured 17.5 x 17 mm on the prior study. More superiorly there is a stable cavitary lesion in the right infrahilar area measuring a maximum of 3.7 x 2.9 cm. It previously measured 4.7 x 2.7 cm.  Chronic interstitial lung disease and emphysema.  CT ABDOMEN AND PELVIS FINDINGS  Hepatobiliary: No focal hepatic lesions to suggest metastatic disease. The gallbladder is surgically absent. No intra or extrahepatic biliary dilatation.  Pancreas: Normal. A duodenum diverticulum is noted near the pancreatic head.  Spleen: Normal size. No focal lesions. Perisplenic collateral vessels are noted.  Adrenals/Urinary Tract: The adrenal glands and kidneys are unremarkable.  Stomach/Bowel: The stomach, duodenum, small bowel and colon are unremarkable. No inflammatory changes, mass lesions or obstructive findings. The terminal ileum is normal. The appendix is normal.  Vascular/Lymphatic: Stable advanced aortoiliac atherosclerotic calcifications. The branch vessels are patent. The major venous structures are patent.  Other: The bladder, prostate gland and seminal vesicles are unremarkable. No pelvic mass or adenopathy. No free pelvic fluid collections. No inguinal mass or adenopathy.  Musculoskeletal: No significant bony findings. Osteoporosis and lumbar degenerative changes are noted.  IMPRESSION: 1. Stable right lower lobe pulmonary lesions as described above. 2. Stable small pericardial effusion and slight interval decrease in size of the right pleural effusion. 3. Stable emphysematous changes and peripheral interstitial lung disease. No new pulmonary lesions. 4. No findings for metastatic disease involving the abdomen/pelvis. 5. Advanced atherosclerotic calcifications involving the thoracic and abdominal aortas and branch vessels.   Electronically Signed   By: Marijo Sanes M.D.   On: 06/11/2014 11:35   Ct Abdomen Pelvis W Contrast  06/11/2014   CLINICAL DATA:  Non small cell lung cancer with brain metastasis diagnosed in August 2014. Chemotherapy completed March 2016. Radiation therapy completed.  EXAM: CT CHEST, ABDOMEN, AND PELVIS WITH CONTRAST  TECHNIQUE: Multidetector CT imaging of the chest, abdomen and pelvis was performed following the standard protocol during bolus administration of intravenous contrast.  CONTRAST:  14mL OMNIPAQUE IOHEXOL 300 MG/ML  SOLN  COMPARISON:  Chest CT 03/04/2014  FINDINGS: CT CHEST FINDINGS  Chest wall: Stable right-sided Port-A-Cath. No supraclavicular or axillary lymphadenopathy. The thyroid gland is grossly normal. The bony thorax is intact. No destructive bone lesions or spinal canal compromise. Stable T10 compression deformity.  Mediastinum: The heart is normal in size. Small pericardial effusion is stable. No enlarged mediastinal or hilar lymph nodes. Small scattered nodes are stable. The aorta demonstrates stable atherosclerotic calcifications. No dissection. The esophagus is grossly normal.  Lungs/pleura:  The right-sided pleural effusion is slightly smaller. Stable emphysematous changes and extensive radiation changes involving the right lower lobe paramediastinal lung. Stable right lower lobe pulmonary nodule which measures 17 x 17 mm on image number 30. It measured 17.5 x 17 mm on the prior study. More superiorly there is a stable cavitary lesion in the right infrahilar area measuring a maximum of 3.7 x 2.9 cm. It previously measured 4.7 x 2.7 cm.  Chronic interstitial lung disease and emphysema.  CT ABDOMEN AND PELVIS FINDINGS  Hepatobiliary: No focal hepatic lesions to suggest metastatic disease. The gallbladder is surgically absent. No intra or extrahepatic biliary dilatation.  Pancreas: Normal. A duodenum diverticulum is noted near the pancreatic head.  Spleen: Normal size. No focal lesions. Perisplenic collateral vessels  are noted.  Adrenals/Urinary Tract: The adrenal glands and kidneys are unremarkable.  Stomach/Bowel: The stomach, duodenum, small bowel and colon are unremarkable. No inflammatory changes, mass lesions or obstructive findings. The terminal ileum is normal. The appendix is normal.  Vascular/Lymphatic: Stable advanced aortoiliac atherosclerotic calcifications. The branch vessels are patent. The major venous structures are patent.  Other: The bladder, prostate gland and seminal vesicles are unremarkable. No pelvic mass or adenopathy. No free pelvic fluid collections. No inguinal mass or adenopathy.  Musculoskeletal: No significant bony findings. Osteoporosis and lumbar degenerative changes are noted.  IMPRESSION: 1. Stable right lower lobe pulmonary lesions as described above. 2. Stable small pericardial effusion and slight interval decrease in size of the right pleural effusion. 3. Stable emphysematous changes and peripheral interstitial lung disease. No new pulmonary lesions. 4. No findings for metastatic disease involving the abdomen/pelvis. 5. Advanced atherosclerotic calcifications involving the thoracic and abdominal aortas and branch vessels.   Electronically Signed   By: Marijo Sanes M.D.   On: 06/11/2014 11:35   ASSESSMENT AND PLAN: this is a very pleasant 81 years old white male with metastatic non-small cell lung cancer, adenocarcinoma with brain metastasis status post stereotactic radiotherapy to the brain lesions followed by palliative radiotherapy to the left lower lobe lung mass. The patient is currently undergoing systemic chemotherapy with carboplatin and Alimta status post 6 cycles with stable disease and he is currently undergoing maintenance chemotherapy with single agent Alimta status post 16 cycles. His chemotherapy was discontinued secondary to pancytopenia and intolerance The recent CT scan of the chest, abdomen and pelvis showed stable disease. I discussed the scan results with the  patient and his family. I recommended for him to continue on observation with repeat CT scan of the chest, abdomen and pelvis in 3 months. For the metastatic brain lesion, I strongly recommended for the patient to see Dr. Lisbeth Renshaw for reevaluation and consideration of treatment of these lesions. He was advised to call immediately if he has any concerning symptoms in the interval. The patient voices understanding of current disease status and treatment options and is in agreement with the current care plan.  All questions were answered. The patient knows to call the clinic with any problems, questions or concerns. We can certainly see the patient much sooner if necessary.  Disclaimer: This note was dictated with voice recognition software. Similar sounding words can inadvertently be transcribed and may not be corrected upon review.

## 2014-06-21 ENCOUNTER — Telehealth: Payer: Self-pay | Admitting: *Deleted

## 2014-06-21 ENCOUNTER — Other Ambulatory Visit: Payer: Self-pay | Admitting: Radiation Oncology

## 2014-06-21 DIAGNOSIS — C7931 Secondary malignant neoplasm of brain: Secondary | ICD-10-CM

## 2014-06-21 MED ORDER — DEXAMETHASONE 4 MG PO TABS: 4.0000 mg | ORAL_TABLET | Freq: Two times a day (BID) | ORAL | Status: AC

## 2014-06-21 NOTE — Telephone Encounter (Signed)
Returned call to Mrs Koller, she is requesting refill on patient's dexamethasone(decadron) 4 mg tablets, 1 tab bid,they use [facility]. I will in basket Dr,.Tammi Klippel and will call her back with status 10:44 AM

## 2014-06-21 NOTE — Telephone Encounter (Signed)
error 

## 2014-06-21 NOTE — Telephone Encounter (Signed)
Called wife informed her Dexamethasone(decadron '4mg'$  rx )refilled per Dr. Tammi Klippel  ,to [facility] at [facility]. ,wife thanked MD and this RN for return call so soon 10:52 AM'

## 2014-06-25 ENCOUNTER — Emergency Department (HOSPITAL_COMMUNITY): Payer: Medicare Other

## 2014-06-25 ENCOUNTER — Inpatient Hospital Stay (HOSPITAL_COMMUNITY)
Admission: EM | Admit: 2014-06-25 | Discharge: 2014-07-06 | DRG: 193 | Disposition: A | Discharge status: Hospice | Payer: Medicare Other | Attending: Internal Medicine | Admitting: Internal Medicine

## 2014-06-25 ENCOUNTER — Encounter (HOSPITAL_COMMUNITY): Payer: Self-pay | Admitting: Emergency Medicine

## 2014-06-25 DIAGNOSIS — E43 Unspecified severe protein-calorie malnutrition: Secondary | ICD-10-CM | POA: Diagnosis not present

## 2014-06-25 DIAGNOSIS — J9601 Acute respiratory failure with hypoxia: Secondary | ICD-10-CM | POA: Diagnosis not present

## 2014-06-25 DIAGNOSIS — C3491 Malignant neoplasm of unspecified part of right bronchus or lung: Secondary | ICD-10-CM | POA: Diagnosis not present

## 2014-06-25 DIAGNOSIS — D6959 Other secondary thrombocytopenia: Secondary | ICD-10-CM | POA: Diagnosis not present

## 2014-06-25 DIAGNOSIS — I313 Pericardial effusion (noninflammatory): Secondary | ICD-10-CM | POA: Diagnosis present

## 2014-06-25 DIAGNOSIS — J9691 Respiratory failure, unspecified with hypoxia: Secondary | ICD-10-CM | POA: Diagnosis not present

## 2014-06-25 DIAGNOSIS — Z79899 Other long term (current) drug therapy: Secondary | ICD-10-CM

## 2014-06-25 DIAGNOSIS — F4323 Adjustment disorder with mixed anxiety and depressed mood: Secondary | ICD-10-CM | POA: Diagnosis not present

## 2014-06-25 DIAGNOSIS — J438 Other emphysema: Secondary | ICD-10-CM | POA: Diagnosis not present

## 2014-06-25 DIAGNOSIS — I3139 Other pericardial effusion (noninflammatory): Secondary | ICD-10-CM | POA: Diagnosis present

## 2014-06-25 DIAGNOSIS — C3490 Malignant neoplasm of unspecified part of unspecified bronchus or lung: Secondary | ICD-10-CM | POA: Diagnosis present

## 2014-06-25 DIAGNOSIS — R569 Unspecified convulsions: Secondary | ICD-10-CM

## 2014-06-25 DIAGNOSIS — J441 Chronic obstructive pulmonary disease with (acute) exacerbation: Secondary | ICD-10-CM | POA: Diagnosis present

## 2014-06-25 DIAGNOSIS — Z87891 Personal history of nicotine dependence: Secondary | ICD-10-CM | POA: Diagnosis not present

## 2014-06-25 DIAGNOSIS — Z515 Encounter for palliative care: Secondary | ICD-10-CM | POA: Diagnosis not present

## 2014-06-25 DIAGNOSIS — C7801 Secondary malignant neoplasm of right lung: Secondary | ICD-10-CM | POA: Diagnosis not present

## 2014-06-25 DIAGNOSIS — R918 Other nonspecific abnormal finding of lung field: Secondary | ICD-10-CM | POA: Diagnosis not present

## 2014-06-25 DIAGNOSIS — K5900 Constipation, unspecified: Secondary | ICD-10-CM | POA: Diagnosis present

## 2014-06-25 DIAGNOSIS — E785 Hyperlipidemia, unspecified: Secondary | ICD-10-CM | POA: Diagnosis not present

## 2014-06-25 DIAGNOSIS — Y95 Nosocomial condition: Secondary | ICD-10-CM | POA: Diagnosis present

## 2014-06-25 DIAGNOSIS — Z9049 Acquired absence of other specified parts of digestive tract: Secondary | ICD-10-CM | POA: Diagnosis present

## 2014-06-25 DIAGNOSIS — J189 Pneumonia, unspecified organism: Principal | ICD-10-CM | POA: Diagnosis present

## 2014-06-25 DIAGNOSIS — N183 Chronic kidney disease, stage 3 unspecified: Secondary | ICD-10-CM | POA: Diagnosis present

## 2014-06-25 DIAGNOSIS — I5032 Chronic diastolic (congestive) heart failure: Secondary | ICD-10-CM | POA: Diagnosis not present

## 2014-06-25 DIAGNOSIS — R0902 Hypoxemia: Secondary | ICD-10-CM | POA: Insufficient documentation

## 2014-06-25 DIAGNOSIS — R05 Cough: Secondary | ICD-10-CM | POA: Insufficient documentation

## 2014-06-25 DIAGNOSIS — I700 Atherosclerosis of aorta: Secondary | ICD-10-CM | POA: Diagnosis present

## 2014-06-25 DIAGNOSIS — D61818 Other pancytopenia: Secondary | ICD-10-CM | POA: Diagnosis present

## 2014-06-25 DIAGNOSIS — Z9221 Personal history of antineoplastic chemotherapy: Secondary | ICD-10-CM

## 2014-06-25 DIAGNOSIS — Z66 Do not resuscitate: Secondary | ICD-10-CM | POA: Diagnosis present

## 2014-06-25 DIAGNOSIS — R600 Localized edema: Secondary | ICD-10-CM | POA: Diagnosis present

## 2014-06-25 DIAGNOSIS — Z823 Family history of stroke: Secondary | ICD-10-CM | POA: Diagnosis not present

## 2014-06-25 DIAGNOSIS — C3431 Malignant neoplasm of lower lobe, right bronchus or lung: Secondary | ICD-10-CM | POA: Diagnosis present

## 2014-06-25 DIAGNOSIS — C7931 Secondary malignant neoplasm of brain: Secondary | ICD-10-CM | POA: Diagnosis present

## 2014-06-25 DIAGNOSIS — J90 Pleural effusion, not elsewhere classified: Secondary | ICD-10-CM | POA: Diagnosis present

## 2014-06-25 DIAGNOSIS — R609 Edema, unspecified: Secondary | ICD-10-CM | POA: Diagnosis not present

## 2014-06-25 DIAGNOSIS — J80 Acute respiratory distress syndrome: Secondary | ICD-10-CM | POA: Diagnosis not present

## 2014-06-25 DIAGNOSIS — R0602 Shortness of breath: Secondary | ICD-10-CM | POA: Diagnosis not present

## 2014-06-25 DIAGNOSIS — J440 Chronic obstructive pulmonary disease with acute lower respiratory infection: Secondary | ICD-10-CM | POA: Diagnosis present

## 2014-06-25 DIAGNOSIS — T380X5A Adverse effect of glucocorticoids and synthetic analogues, initial encounter: Secondary | ICD-10-CM | POA: Diagnosis not present

## 2014-06-25 DIAGNOSIS — J984 Other disorders of lung: Secondary | ICD-10-CM

## 2014-06-25 DIAGNOSIS — R531 Weakness: Secondary | ICD-10-CM | POA: Diagnosis not present

## 2014-06-25 DIAGNOSIS — N189 Chronic kidney disease, unspecified: Secondary | ICD-10-CM | POA: Diagnosis present

## 2014-06-25 DIAGNOSIS — D649 Anemia, unspecified: Secondary | ICD-10-CM | POA: Diagnosis present

## 2014-06-25 DIAGNOSIS — R739 Hyperglycemia, unspecified: Secondary | ICD-10-CM | POA: Diagnosis not present

## 2014-06-25 DIAGNOSIS — Z809 Family history of malignant neoplasm, unspecified: Secondary | ICD-10-CM | POA: Diagnosis not present

## 2014-06-25 DIAGNOSIS — Z923 Personal history of irradiation: Secondary | ICD-10-CM

## 2014-06-25 DIAGNOSIS — R059 Cough, unspecified: Secondary | ICD-10-CM | POA: Insufficient documentation

## 2014-06-25 DIAGNOSIS — D638 Anemia in other chronic diseases classified elsewhere: Secondary | ICD-10-CM | POA: Diagnosis present

## 2014-06-25 DIAGNOSIS — S5001XA Contusion of right elbow, initial encounter: Secondary | ICD-10-CM | POA: Diagnosis not present

## 2014-06-25 DIAGNOSIS — C719 Malignant neoplasm of brain, unspecified: Secondary | ICD-10-CM | POA: Diagnosis not present

## 2014-06-25 DIAGNOSIS — J209 Acute bronchitis, unspecified: Secondary | ICD-10-CM

## 2014-06-25 DIAGNOSIS — R627 Adult failure to thrive: Secondary | ICD-10-CM | POA: Diagnosis present

## 2014-06-25 DIAGNOSIS — Z9981 Dependence on supplemental oxygen: Secondary | ICD-10-CM | POA: Diagnosis not present

## 2014-06-25 DIAGNOSIS — D696 Thrombocytopenia, unspecified: Secondary | ICD-10-CM | POA: Diagnosis present

## 2014-06-25 DIAGNOSIS — Z6820 Body mass index (BMI) 20.0-20.9, adult: Secondary | ICD-10-CM | POA: Diagnosis not present

## 2014-06-25 DIAGNOSIS — I70409 Unspecified atherosclerosis of autologous vein bypass graft(s) of the extremities, unspecified extremity: Secondary | ICD-10-CM

## 2014-06-25 DIAGNOSIS — R0600 Dyspnea, unspecified: Secondary | ICD-10-CM | POA: Diagnosis not present

## 2014-06-25 DIAGNOSIS — D630 Anemia in neoplastic disease: Secondary | ICD-10-CM | POA: Diagnosis not present

## 2014-06-25 DIAGNOSIS — J449 Chronic obstructive pulmonary disease, unspecified: Secondary | ICD-10-CM | POA: Diagnosis present

## 2014-06-25 HISTORY — DX: Atherosclerosis of aorta: I70.0

## 2014-06-25 HISTORY — DX: Pericardial effusion (noninflammatory): I31.3

## 2014-06-25 HISTORY — DX: Adverse effect of glucocorticoids and synthetic analogues, initial encounter: T38.0X5A

## 2014-06-25 HISTORY — DX: Pleural effusion, not elsewhere classified: J90

## 2014-06-25 HISTORY — DX: Hyperglycemia, unspecified: R73.9

## 2014-06-25 LAB — URINALYSIS, ROUTINE W REFLEX MICROSCOPIC
BILIRUBIN URINE: NEGATIVE
Bilirubin Urine: NEGATIVE
GLUCOSE, UA: NEGATIVE mg/dL
Glucose, UA: NEGATIVE mg/dL
Hgb urine dipstick: NEGATIVE
Hgb urine dipstick: NEGATIVE
Ketones, ur: NEGATIVE mg/dL
Ketones, ur: NEGATIVE mg/dL
Leukocytes, UA: NEGATIVE
Leukocytes, UA: NEGATIVE
Nitrite: NEGATIVE
Nitrite: NEGATIVE
PROTEIN: NEGATIVE mg/dL
Protein, ur: NEGATIVE mg/dL
Specific Gravity, Urine: 1.01 (ref 1.005–1.030)
Specific Gravity, Urine: 1.008 (ref 1.005–1.030)
UROBILINOGEN UA: 1 mg/dL (ref 0.0–1.0)
Urobilinogen, UA: 1 mg/dL (ref 0.0–1.0)
pH: 6 (ref 5.0–8.0)
pH: 6 (ref 5.0–8.0)

## 2014-06-25 LAB — COMPREHENSIVE METABOLIC PANEL
ALK PHOS: 117 U/L (ref 38–126)
ALT: 20 U/L (ref 17–63)
AST: 29 U/L (ref 15–41)
Albumin: 2.5 g/dL — ABNORMAL LOW (ref 3.5–5.0)
Anion gap: 13 (ref 5–15)
BUN: 37 mg/dL — ABNORMAL HIGH (ref 6–20)
CO2: 21 mmol/L — ABNORMAL LOW (ref 22–32)
CREATININE: 1.52 mg/dL — AB (ref 0.61–1.24)
Calcium: 8.4 mg/dL — ABNORMAL LOW (ref 8.9–10.3)
Chloride: 98 mmol/L — ABNORMAL LOW (ref 101–111)
GFR, EST AFRICAN AMERICAN: 49 mL/min — AB (ref >60)
GFR, EST NON AFRICAN AMERICAN: 42 mL/min — AB (ref >60)
Glucose, Bld: 123 mg/dL — ABNORMAL HIGH (ref 65–99)
POTASSIUM: 4 mmol/L (ref 3.5–5.1)
SODIUM: 132 mmol/L — AB (ref 135–145)
Total Bilirubin: 0.7 mg/dL (ref 0.3–1.2)
Total Protein: 5.8 g/dL — ABNORMAL LOW (ref 6.5–8.1)

## 2014-06-25 LAB — CBC WITH DIFFERENTIAL/PLATELET
BASOS ABS: 0 10*3/uL (ref 0.0–0.1)
Basophils Relative: 0 % (ref 0–1)
Eosinophils Absolute: 0 10*3/uL (ref 0.0–0.7)
Eosinophils Relative: 0 % (ref 0–5)
HCT: 20.7 % — ABNORMAL LOW (ref 39.0–52.0)
Hemoglobin: 7.2 g/dL — ABNORMAL LOW (ref 13.0–17.0)
LYMPHS PCT: 4 % — AB (ref 12–46)
Lymphs Abs: 0.2 10*3/uL — ABNORMAL LOW (ref 0.7–4.0)
MCH: 31.6 pg (ref 26.0–34.0)
MCHC: 34.8 g/dL (ref 30.0–36.0)
MCV: 90.8 fL (ref 78.0–100.0)
MONO ABS: 0.2 10*3/uL (ref 0.1–1.0)
Monocytes Relative: 3 % (ref 3–12)
NEUTROS ABS: 5.7 10*3/uL (ref 1.7–7.7)
Neutrophils Relative %: 93 % — ABNORMAL HIGH (ref 43–77)
PLATELETS: 68 10*3/uL — AB (ref 150–400)
RBC: 2.28 MIL/uL — AB (ref 4.22–5.81)
RDW: 21.1 % — AB (ref 11.5–15.5)
WBC: 6.1 10*3/uL (ref 4.0–10.5)

## 2014-06-25 LAB — PROCALCITONIN: Procalcitonin: 0.14 ng/mL

## 2014-06-25 LAB — TROPONIN I
TROPONIN I: 0.04 ng/mL — AB (ref <0.031)
TROPONIN I: 0.04 ng/mL — AB (ref <0.031)

## 2014-06-25 LAB — PREPARE RBC (CROSSMATCH)

## 2014-06-25 LAB — BRAIN NATRIURETIC PEPTIDE: B Natriuretic Peptide: 122 pg/mL — ABNORMAL HIGH (ref 0.0–100.0)

## 2014-06-25 MED ORDER — DOCUSATE SODIUM 100 MG PO CAPS
100.0000 mg | ORAL_CAPSULE | Freq: Two times a day (BID) | ORAL | Status: DC
  Administered 2014-06-25 – 2014-07-02 (×13): 100 mg via ORAL
  Filled 2014-06-25 (×14): qty 1

## 2014-06-25 MED ORDER — ENSURE ENLIVE PO LIQD: 237.0000 mL | Freq: Every day | ORAL | Status: DC | PRN

## 2014-06-25 MED ORDER — POLYETHYLENE GLYCOL 3350 17 G PO PACK: 17.0000 g | PACK | Freq: Every day | ORAL | Status: DC | PRN

## 2014-06-25 MED ORDER — HYDROCODONE-ACETAMINOPHEN 5-325 MG PO TABS: 1.0000 | ORAL_TABLET | ORAL | Status: DC | PRN

## 2014-06-25 MED ORDER — ACETAMINOPHEN 650 MG RE SUPP: 650.0000 mg | Freq: Four times a day (QID) | RECTAL | Status: DC | PRN

## 2014-06-25 MED ORDER — ACETAMINOPHEN 325 MG PO TABS
650.0000 mg | ORAL_TABLET | Freq: Four times a day (QID) | ORAL | Status: DC | PRN
  Administered 2014-06-27 – 2014-07-04 (×3): 650 mg via ORAL
  Filled 2014-06-25 (×3): qty 2

## 2014-06-25 MED ORDER — ACETAMINOPHEN 325 MG PO TABS
650.0000 mg | ORAL_TABLET | Freq: Once | ORAL | Status: AC
  Administered 2014-06-25: 650 mg via ORAL
  Filled 2014-06-25: qty 2

## 2014-06-25 MED ORDER — GUAIFENESIN ER 600 MG PO TB12
600.0000 mg | ORAL_TABLET | Freq: Two times a day (BID) | ORAL | Status: DC
  Administered 2014-06-25 – 2014-07-06 (×22): 600 mg via ORAL
  Filled 2014-06-25 (×23): qty 1

## 2014-06-25 MED ORDER — IPRATROPIUM BROMIDE 0.02 % IN SOLN
0.5000 mg | Freq: Four times a day (QID) | RESPIRATORY_TRACT | Status: DC
Start: 2014-06-25 — End: 2014-06-26
  Administered 2014-06-25 – 2014-06-26 (×3): 0.5 mg via RESPIRATORY_TRACT
  Filled 2014-06-25 (×3): qty 2.5

## 2014-06-25 MED ORDER — PIPERACILLIN-TAZOBACTAM 3.375 G IVPB
3.3750 g | Freq: Three times a day (TID) | INTRAVENOUS | Status: DC
  Filled 2014-06-25: qty 50

## 2014-06-25 MED ORDER — SODIUM CHLORIDE 0.9 % IJ SOLN
10.0000 mL | Freq: Two times a day (BID) | INTRAMUSCULAR | Status: DC
  Administered 2014-06-28 – 2014-07-01 (×2): 10 mL

## 2014-06-25 MED ORDER — ONDANSETRON HCL 4 MG/2ML IJ SOLN: 4.0000 mg | Freq: Four times a day (QID) | INTRAMUSCULAR | Status: DC | PRN

## 2014-06-25 MED ORDER — PIPERACILLIN-TAZOBACTAM 3.375 G IVPB 30 MIN
3.3750 g | Freq: Once | INTRAVENOUS | Status: AC
  Administered 2014-06-25: 3.375 g via INTRAVENOUS
  Filled 2014-06-25: qty 50

## 2014-06-25 MED ORDER — LEVETIRACETAM 500 MG PO TABS
500.0000 mg | ORAL_TABLET | Freq: Two times a day (BID) | ORAL | Status: DC
  Administered 2014-06-25 – 2014-07-06 (×22): 500 mg via ORAL
  Filled 2014-06-25 (×23): qty 1

## 2014-06-25 MED ORDER — SODIUM CHLORIDE 0.9 % IJ SOLN
10.0000 mL | INTRAMUSCULAR | Status: DC | PRN
  Administered 2014-06-26 – 2014-07-02 (×5): 10 mL
  Filled 2014-06-25 (×5): qty 40

## 2014-06-25 MED ORDER — SODIUM CHLORIDE 0.9 % IJ SOLN
3.0000 mL | Freq: Two times a day (BID) | INTRAMUSCULAR | Status: DC
  Administered 2014-07-01 – 2014-07-05 (×3): 3 mL via INTRAVENOUS

## 2014-06-25 MED ORDER — ONDANSETRON HCL 4 MG PO TABS: 4.0000 mg | ORAL_TABLET | Freq: Four times a day (QID) | ORAL | Status: DC | PRN

## 2014-06-25 MED ORDER — DIPHENHYDRAMINE HCL 25 MG PO CAPS
25.0000 mg | ORAL_CAPSULE | Freq: Once | ORAL | Status: AC
  Administered 2014-06-25: 25 mg via ORAL
  Filled 2014-06-25: qty 1

## 2014-06-25 MED ORDER — SIMVASTATIN 10 MG PO TABS
10.0000 mg | ORAL_TABLET | Freq: Every day | ORAL | Status: DC
  Administered 2014-06-25 – 2014-07-05 (×11): 10 mg via ORAL
  Filled 2014-06-25 (×12): qty 1

## 2014-06-25 MED ORDER — DOXYCYCLINE HYCLATE 100 MG PO TABS
100.0000 mg | ORAL_TABLET | Freq: Two times a day (BID) | ORAL | Status: DC
  Administered 2014-06-25 – 2014-06-26 (×3): 100 mg via ORAL
  Filled 2014-06-25 (×3): qty 1

## 2014-06-25 MED ORDER — DEXAMETHASONE 4 MG PO TABS
4.0000 mg | ORAL_TABLET | Freq: Two times a day (BID) | ORAL | Status: DC
  Administered 2014-06-25 – 2014-07-06 (×22): 4 mg via ORAL
  Filled 2014-06-25: qty 2
  Filled 2014-06-25 (×2): qty 1
  Filled 2014-06-25: qty 2
  Filled 2014-06-25 (×2): qty 1
  Filled 2014-06-25: qty 2
  Filled 2014-06-25: qty 1
  Filled 2014-06-25 (×2): qty 2
  Filled 2014-06-25 (×3): qty 1
  Filled 2014-06-25: qty 2
  Filled 2014-06-25 (×12): qty 1

## 2014-06-25 MED ORDER — ALBUTEROL SULFATE (2.5 MG/3ML) 0.083% IN NEBU
2.5000 mg | INHALATION_SOLUTION | RESPIRATORY_TRACT | Status: DC | PRN
  Administered 2014-06-30: 2.5 mg via RESPIRATORY_TRACT
  Filled 2014-06-25: qty 3

## 2014-06-25 MED ORDER — SODIUM CHLORIDE 0.9 % IV SOLN
Freq: Once | INTRAVENOUS | Status: AC
  Administered 2014-06-25: 23:00:00 via INTRAVENOUS

## 2014-06-25 NOTE — ED Notes (Signed)
Bed: FG18 Expected date:  Expected time:  Means of arrival:  Comments: Housekeeping, C diff

## 2014-06-25 NOTE — ED Notes (Signed)
Patient is aware a urine sample is needed, currently getting fluids via IV and will check back.

## 2014-06-25 NOTE — ED Notes (Signed)
MD Datouva at bedside.

## 2014-06-25 NOTE — ED Notes (Signed)
Per pt, states he has'nt been feeling well for a few days-platelets low, low SATS

## 2014-06-25 NOTE — ED Provider Notes (Signed)
CSN: [id number]     Arrival date & time 06/25/14  1545 History   First MD Initiated Contact with Patient 06/25/14 1556     Chief Complaint  Patient presents with  . low platelets      (Consider location/radiation/quality/duration/timing/severity/associated sxs/prior Treatment) The history is provided by the patient. No language interpreter was used.  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 Patient reported that today and yesterday he was feeling generally weak. Reported that he staggered into a wall today and his right elbow that resulted in bleeding that took along time to subside. Patient reports that he's been having intermittent coughing, wife reports is mainly at night. Reported he is short of breath mainly with exertion. States that he does not use oxygen at home. Stated that he is currently on no chemotherapy or radiation, reported this was stopped in March 2016. Reported that he's been noticing swelling to his legs bilaterally. Family reported that he has not been taking his Lasix for several weeks, reported that this is a when necessary order. Patient denied fever, hemoptysis, nausea, vomiting, diarrhea, abdominal pain, urinary symptoms, melena, hematochezia, appetite changes, chest pain, headache, travel. PCP Dr. Laurance Flatten Oncologist Dr. Curt Bears  Past Medical History  Diagnosis Date  . COPD (chronic obstructive pulmonary disease)   . Allergy     allergic rhinitis  . Hx of radiation therapy 12/09/12-01/01/13    lung 37.5Gy  . nscl ca w/ brain mets dx'd 08/2012    Patient has a lung mass which is being evaluated   Past Surgical History  Procedure Laterality Date  . Cholecystectomy     Family History  Problem Relation Age of Onset  . Cancer Mother     breast  . Stroke Father    History  Substance  Use Topics  . Smoking status: Former Smoker    Quit date: 09/11/1998  . Smokeless tobacco: Never Used  . Alcohol Use: No    Review of Systems  Constitutional: Positive for fatigue. Negative for fever and chills.  Eyes: Negative for visual disturbance.  Respiratory: Positive for cough and shortness of breath. Negative for chest tightness.   Cardiovascular: Positive for leg swelling. Negative for chest pain.  Gastrointestinal: Negative for nausea, vomiting, abdominal pain, diarrhea, constipation, blood in stool and anal bleeding.  Genitourinary: Negative for dysuria and hematuria.  Neurological: Negative for dizziness.      Allergies  Asa and Sulfa antibiotics  Home Medications   Prior to Admission medications   Medication Sig Start Date End Date Taking? Authorizing Provider  acetaminophen (TYLENOL) 500 MG tablet Take 500 mg by mouth every 6 (six) hours as needed for mild pain.    Yes Historical Provider, MD  clobetasol cream (TEMOVATE) 3.79 % Apply 1 application topically daily as needed (for itching).   Yes Historical Provider, MD  dexamethasone (DECADRON) 4 MG tablet Take 1 tablet (4 mg total) by mouth 2 (two) times daily. 06/21/14  Yes Tyler Pita, MD  feeding supplement, ENSURE COMPLETE, (ENSURE COMPLETE) LIQD Take 237 mLs by mouth 3 (three) times daily between meals. Patient taking differently: Take 237 mLs by mouth daily as needed (nutritional supplement).  03/11/14  Yes Maryann Mikhail, DO  folic acid (FOLVITE) 024 MCG tablet Take 400 mcg by mouth every morning.   Yes Historical Provider, MD  furosemide (LASIX) 20 MG tablet TAKE 1 TABLET BY MOUTH DAILY AS NEEDED FOR SWELLING OF THE LOWER EXTREMITIES. Patient taking differently: TAKE 20 MG BY MOUTH DAILY AS NEEDED FOR SWELLING OF THE LOWER EXTREMITIES. 05/09/14  Yes Curt Bears, MD  levETIRAcetam (KEPPRA) 500 MG tablet Take 1 tablet (500 mg total) by mouth 2 (two) times daily. 05/02/14  Yes Orson Eva, MD  Polyethyl  Glycol-Propyl Glycol (SYSTANE OP) Place 1 drop into both eyes daily.   Yes Historical Provider, MD  simvastatin (ZOCOR) 10 MG tablet Take 10 mg by mouth at bedtime.   Yes Historical Provider, MD   BP 117/68 mmHg  Pulse 97  Temp(Src) 97.8 F (36.6 C) (Oral)  Resp 19  SpO2 100% Physical Exam  Constitutional: He is oriented to person, place, and time. He appears well-developed and well-nourished. No distress.  HENT:  Head: Normocephalic and atraumatic.  Eyes: Conjunctivae and EOM are normal. Pupils are equal, round, and reactive to light. Right eye exhibits no discharge. Left eye exhibits no discharge.  Neck: Normal range of motion. Neck supple.  Cardiovascular: Normal rate, regular rhythm and normal heart sounds.  Exam reveals no friction rub.   No murmur heard. Pulses:      Radial pulses are 2+ on the right side, and 2+ on the left side.       Dorsalis pedis pulses are 2+ on the right side, and 2+ on the left side.  Refill less than 3 seconds Pitting edema identified to lower tremors bilaterally, 2+ rigid from the dorsal aspects of the feet to below the knees bilaterally  Negative swelling to the upper extremities bilaterally   Pulmonary/Chest: Effort normal and breath sounds normal. No respiratory distress. He has no wheezes. He has no rales. He exhibits no tenderness.  Patient is able to speak in full sentences without difficulty Negative use of excess her muscles  Pulse ox 85% on room air-patient currently on nasal cannula-2 L/m  Musculoskeletal: Normal range of motion.  Neurological: He is alert and oriented to person, place, and time. No cranial nerve deficit. He exhibits normal muscle tone. Coordination normal. GCS eye subscore is 4. GCS verbal subscore is 5. GCS motor subscore is 6.  Skin: Skin is warm and dry. No rash noted. He is not diaphoretic. No erythema.  Psychiatric: He has a normal mood and affect. His behavior is normal. Thought content normal.  Nursing note and  vitals reviewed.   ED Course  Procedures (including critical care time)  Results for orders placed or performed during the hospital encounter of 06/25/14  CBC with Differential/Platelet  Result Value Ref Range   WBC 6.1 4.0 - 10.5 K/uL   RBC 2.28 (L) 4.22 - 5.81 MIL/uL   Hemoglobin 7.2 (L) 13.0 - 17.0 g/dL   HCT 20.7 (L) 39.0 - 52.0 %   MCV 90.8 78.0 - 100.0 fL   MCH 31.6 26.0 - 34.0 pg   MCHC 34.8 30.0 - 36.0 g/dL   RDW 21.1 (H) 11.5 - 15.5 %   Platelets 68 (L) 150 - 400 K/uL   Neutrophils Relative % 93 (H) 43 - 77 %   Lymphocytes Relative 4 (L) 12 - 46 %   Monocytes Relative 3 3 - 12 %   Eosinophils Relative 0 0 - 5 %   Basophils Relative 0 0 - 1 %   Neutro Abs 5.7 1.7 - 7.7 K/uL   Lymphs Abs 0.2 (L) 0.7 - 4.0 K/uL   Monocytes Absolute 0.2 0.1 -  1.0 K/uL   Eosinophils Absolute 0.0 0.0 - 0.7 K/uL   Basophils Absolute 0.0 0.0 - 0.1 K/uL   RBC Morphology POLYCHROMASIA PRESENT   Comprehensive metabolic panel  Result Value Ref Range   Sodium 132 (L) 135 - 145 mmol/L   Potassium 4.0 3.5 - 5.1 mmol/L   Chloride 98 (L) 101 - 111 mmol/L   CO2 21 (L) 22 - 32 mmol/L   Glucose, Bld 123 (H) 65 - 99 mg/dL   BUN 37 (H) 6 - 20 mg/dL   Creatinine, Ser 1.52 (H) 0.61 - 1.24 mg/dL   Calcium 8.4 (L) 8.9 - 10.3 mg/dL   Total Protein 5.8 (L) 6.5 - 8.1 g/dL   Albumin 2.5 (L) 3.5 - 5.0 g/dL   AST 29 15 - 41 U/L   ALT 20 17 - 63 U/L   Alkaline Phosphatase 117 38 - 126 U/L   Total Bilirubin 0.7 0.3 - 1.2 mg/dL   GFR calc non Af Amer 42 (L) >60 mL/min   GFR calc Af Amer 49 (L) >60 mL/min   Anion gap 13 5 - 15  Urinalysis, Routine w reflex microscopic (not at [facility])  Result Value Ref Range   Color, Urine YELLOW YELLOW   APPearance CLEAR CLEAR   Specific Gravity, Urine 1.010 1.005 - 1.030   pH 6.0 5.0 - 8.0   Glucose, UA NEGATIVE NEGATIVE mg/dL   Hgb urine dipstick NEGATIVE NEGATIVE   Bilirubin Urine NEGATIVE NEGATIVE   Ketones, ur NEGATIVE NEGATIVE mg/dL   Protein, ur NEGATIVE NEGATIVE  mg/dL   Urobilinogen, UA 1.0 0.0 - 1.0 mg/dL   Nitrite NEGATIVE NEGATIVE   Leukocytes, UA NEGATIVE NEGATIVE  Brain natriuretic peptide  Result Value Ref Range   B Natriuretic Peptide 122.0 (H) 0.0 - 100.0 pg/mL  Troponin I  Result Value Ref Range   Troponin I 0.04 (H) <0.031 ng/mL  Type and screen for Red Blood Exchange  Result Value Ref Range   ABO/RH(D) O POS    Antibody Screen NEG    Sample Expiration 06/28/2014     Labs Review Labs Reviewed  CBC WITH DIFFERENTIAL/PLATELET - Abnormal; Notable for the following:    RBC 2.28 (*)    Hemoglobin 7.2 (*)    HCT 20.7 (*)    RDW 21.1 (*)    Platelets 68 (*)    Neutrophils Relative % 93 (*)    Lymphocytes Relative 4 (*)    Lymphs Abs 0.2 (*)    All other components within normal limits  COMPREHENSIVE METABOLIC PANEL - Abnormal; Notable for the following:    Sodium 132 (*)    Chloride 98 (*)    CO2 21 (*)    Glucose, Bld 123 (*)    BUN 37 (*)    Creatinine, Ser 1.52 (*)    Calcium 8.4 (*)    Total Protein 5.8 (*)    Albumin 2.5 (*)    GFR calc non Af Amer 42 (*)    GFR calc Af Amer 49 (*)    All other components within normal limits  BRAIN NATRIURETIC PEPTIDE - Abnormal; Notable for the following:    B Natriuretic Peptide 122.0 (*)    All other components within normal limits  TROPONIN I - Abnormal; Notable for the following:    Troponin I 0.04 (*)    All other components within normal limits  URINALYSIS, ROUTINE W REFLEX MICROSCOPIC (NOT AT [facility])  TYPE AND SCREEN    Imaging Review Dg Elbow Complete  Right  06/25/2014   CLINICAL DATA:  Acute onset of right elbow injury. Status post fall backwards, hitting window with right arm. Posterior medial right elbow hematoma. Initial encounter.  EXAM: RIGHT ELBOW - COMPLETE 3+ VIEW  COMPARISON:  None.  FINDINGS: There is no evidence of fracture or dislocation. The visualized joint spaces are preserved. No significant joint effusion is identified. The known soft tissue hematoma is  not well characterized on radiograph.  IMPRESSION: No evidence of fracture or dislocation.   Electronically Signed   By: Garald Balding M.D.   On: 06/25/2014 17:40   Dg Chest Port 1 View  06/25/2014   CLINICAL DATA:  81 year old male with shortness of breath and weakness for 1 week. History of asthma and lung cancer.  EXAM: PORTABLE CHEST - 1 VIEW  COMPARISON:  Chest x-ray 04/19/2014.  Chest CT 06/11/2014.  FINDINGS: Post treatment related changes in the perihilar aspect of the right lung are again noted, where there is a persistent mass like opacity on today's chest x-ray, which appears slightly more prominent than prior chest x-ray from 04/19/2014. Coarse interstitial markings are noted throughout the lungs bilaterally. Mild diffuse bronchial wall thickening. Small right and trace left pleural effusions. Heart size is normal. Atherosclerosis in the thoracic aorta. Right internal jugular single-lumen porta cath with tip terminating in the superior aspect of the right atrium.  IMPRESSION: 1. Diffuse bronchial wall thickening and interstitial prominence increased compared to the prior examination. Findings may reflect an acute bronchitis, or could be related to noncardiogenic edema. 2. Post treatment related changes of prior radiation therapy in the perihilar aspect of the right lung again noted. 3. Small right and trace left pleural effusion. 4. Atherosclerosis.   Electronically Signed   By: Vinnie Langton M.D.   On: 06/25/2014 16:44     EKG Interpretation   Date/Time:  Friday June 25 2014 16:21:48 EDT Ventricular Rate:  92 PR Interval:  168 QRS Duration: 85 QT Interval:  337 QTC Calculation: 417 R Axis:   14 Text Interpretation:  Sinus rhythm Low voltage, precordial leads Baseline  wander in lead(s) V3 Since last tracing rate slower Confirmed by MILLER   MD, BRIAN ([id number]) on 06/25/2014 4:47:38 PM       4:34 PM Patient sating on 99% on 2L/min of nasal cannula.   6:17 PM This provider spoke  with Dr. Roel Cluck, [facility]. Discussed case, labs, imaging, ED course in great detail. Patient to be admitted under care [facility].  MDM   Final diagnoses:  Acute bronchitis, unspecified organism  Hypoxia  Shortness of breath  Weakness generalized  Pancytopenia  Lung cancer, primary, with metastasis from lung to other site, right    Medications  piperacillin-tazobactam (ZOSYN) IVPB 3.375 g (not administered)  piperacillin-tazobactam (ZOSYN) IVPB 3.375 g (0 g Intravenous Stopped 06/25/14 1919)    Filed Vitals:   06/25/14 1615 06/25/14 1715 06/25/14 1740 06/25/14 1742  BP:   117/68 117/68  Pulse:  99 97 97  Temp:      TempSrc:      Resp:    19  SpO2: 98% 99% 99% 100%   This provider reviewed the patient's chart. Patient was seen and assessed in ED setting on 04/19/2014 which he was diagnosed with similar findings of shortness of breath, hypoxia and tachycardia. Patient was admitted to the floor. Patient had seizure exacerbation at this time with improvement with diuresis. EKG noted sinus rhythm with a heart rate of 92 beats per minute. Troponin mildly  elevated at 0.04. BNP very mild elevation of 122.0. CBC noted hemoglobin of 7.2, hematocrit 20.7, platelets of 68-1 compared to previous labs patient does have a history of pancytopenia. CMP noted BUN of 37 and creatinine of 1.52, has decreased when compared to 8 days ago when creatinine was 2.0. UA negative hemoglobin, nitrites, leukocytes-negative findings of infection. Chest x-ray noted diffuse bronchial ring interstitial prominence increased, findings may represent acute bronchitis. Small right and trace left pleural effusions noted. Plain film of right elbow negative for fracture or dislocation.  Patient presenting to the ED with acute bronchitis and hypoxia-patient was 85% on room air, with nasal cannula 2 L/m patient has stated 100%. Negative signs of acute distress or difficulty speaking. Patient started on IV and  bionic regarding acute bronchitis. Patient has known history of pancytopenia, type and screen has been ordered. Patient has history of lung cancer with brain metastases, currently being followed by Dr. Lorna Few. Patient to be admitted regarding shortness of breath, generalized weakness regarding pancytopenia, and hypoxia. Mildly elevated troponin, will need to be monitored. Discussed plan for admission with patient in great detail - patient agreed to plan of care. Patient to be admitted under the care of [facility].   Jamse Mead, PA-C 06/25/14 1922  Noemi Chapel, MD 06/25/14 [phone]

## 2014-06-25 NOTE — ED Provider Notes (Signed)
81 year old male, history of non-small cell carcinoma of the lung which has metastasized to the brain, he is not currently on chemotherapy secondary to pancytopenia which was diagnosed recently. He is considering brain irradiation secondary to metastatic disease to the brain. He presents for shortness of breath, states that he felt very short of breath prior to arrival, has not been feeling well for a few days though this is of a complaint, he did have a fall today when he was trying to walk, falling backwards and striking his elbow. At this time the patient does feel mild shortness of breath, has an occasional cough though this is a chronic finding as well and has increased bilateral lower extremity edema. He did have similar findings when he was admitted to the hospital a couple of months ago for cellulitis. The family states that the swelling is not as much today as it usually has been in the past.  On exam the patient has decreased lung sounds bilaterally, mild expiratory wheezing, no rales, no distress, bilateral peripheral edema, 2+ pitting edema, no asymmetry, no redness to the skin. Abdomen is soft and nontender, mucous membranes are normal. Oxygen is less than 90% on room air, the patient is not on home oxygen at home.  Would consider potential pneumonia, pneumothorax, ? Recent pericardial effusion?  Labs, xray, admit.  Medical screening examination/treatment/procedure(s) were conducted as a shared visit with non-physician practitioner(s) and myself.  I personally evaluated the patient during the encounter.  Clinical Impression:   Final diagnoses:  Acute bronchitis, unspecified organism  Hypoxia  Shortness of breath  Weakness generalized  Pancytopenia  Lung cancer, primary, with metastasis from lung to other site, right         Noemi Chapel, MD 06/25/14 2342

## 2014-06-25 NOTE — ED Notes (Signed)
RN [place] starting IV currently.

## 2014-06-25 NOTE — Hospital Discharge Follow-Up (Addendum)
PCP:  [name], MD  [facility] [place]  Referring provide [place]   Chief Complaint: Dyspnea  HPI: [name] is a 81 y.o. male   has a past medical history of COPD (chronic obstructive pulmonary disease); Allergy; radiation therapy (12/09/12-01/01/13); and nscl ca w/ brain mets (dx'd 08/2012).   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 Treatment was complicated by pancytopenia.  Patient was seen by his oncologist on June 2 had recent CT scan imaging that showed stable disease. I am asked to continue monitoring him and repeat CT imaging in 3 months and to refer him back to radiation oncology for treatment of metastatic brain lesions.   The past few days he has been Tired . Has been having worsening shortness of breath. Patient has been so weak and staggering. had been having some coughing worse at night. He has "lost balance" today and hit his elbow.  Denies LOC no head trauma. NO seizure activity.  The bleeding from the elbow caused a stain on his shirt  and family took him to ER to get that checked out knowing hx of thrombocytopenia.   Pateitn endorses Dyspnea on exertion. The bit of wheezing although not significant nonproductive cough. Denies any fevers he is on baseline not on oxygen but on arrival to emergency department was noted to be hypoxic down to mid 80s requiring 2 L. Reports bilateral leg swelling which has been going on for quite some time per family of the swelling has improved.  Patient supposed to be on Lasix PRN but haven't been taking it recently because his swelling has come down. No nausea vomiting diarrhea no abdominal pain no black stools no blood in stools. Hemoglobin noted to be down to 7.2 with platelet count of 68 Troponin was noted to be slightly elevated at 0.04 chest x-ray done was worrisome for  bronchitis versus noncardiogenic edema.   Patient with known history of COPD. Have had intermittent weakness and fatigue. Reports 5 days ago he had a severe headache on the top of his head he took some tylenol and it resolved. He has had intermittent trouble seeing out of right eye and double vision that comes and goes gets better with blinking and associated with dry eye, he thinks this has ben happening for months.   Of note patient had an echogram in April 2016 showing EF 55-60 percent with grade 1 diastolic dysfunction.   Emergency department patient was given a dose of Zosyn Hospitalist was called for admission for bronchitis and elevated troponin with significant anemia 70 mild CHF exacerbation  Review of Systems:    Pertinent positives include: shortness of breath at rest, dyspnea on exertion non-productive coughwheezing  Constitutional:  No weight loss, night sweats, Fevers, chills, fatigue, weight loss  HEENT:  No headaches, Difficulty swallowing,Tooth/dental problems,Sore throat,  No sneezing, itching, ear ache, nasal congestion, post nasal drip,  Cardio-vascular:  No chest pain, Orthopnea, PND, anasarca, dizziness, palpitations.no Bilateral lower extremity swelling  GI:  No heartburn, indigestion, abdominal pain, nausea, vomiting, diarrhea, change in bowel habits, loss of appetite, melena, blood in stool, hematemesis Resp:   No excess mucus, no productive cough, No , No coughing up of blood.No change in color of mucus.  Skin:  no rash or lesions. No jaundice GU:  no dysuria, change in color of urine, no urgency or frequency. No  straining to urinate.  No flank pain.  Musculoskeletal:  No joint pain or no joint swelling. No decreased range of motion. No back pain.  Psych:  No change in mood or affect. No depression or anxiety. No memory loss.  Neuro: no localizing neurological complaints, no tingling, no weakness, no double vision, no gait abnormality, no slurred speech, no  confusion  Otherwise ROS are negative except for above, 10 systems were reviewed  Past Medical History: Past Medical History  Diagnosis Date  . COPD (chronic obstructive pulmonary disease)   . Allergy     allergic rhinitis  . Hx of radiation therapy 12/09/12-01/01/13    lung 37.5Gy  . nscl ca w/ brain mets dx'd 08/2012    Patient has a lung mass which is being evaluated   Past Surgical History  Procedure Laterality Date  . Cholecystectomy       Medications: Prior to Admission medications   Medication Sig Start Date End Date Taking? Authorizing Provider  acetaminophen (TYLENOL) 500 MG tablet Take 500 mg by mouth every 6 (six) hours as needed for mild pain.    Yes Historical Provider, MD  clobetasol cream (TEMOVATE) 8.65 % Apply 1 application topically daily as needed (for itching).   Yes Historical Provider, MD  dexamethasone (DECADRON) 4 MG tablet Take 1 tablet (4 mg total) by mouth 2 (two) times daily. 06/21/14  Yes Tyler Pita, MD  feeding supplement, ENSURE COMPLETE, (ENSURE COMPLETE) LIQD Take 237 mLs by mouth 3 (three) times daily between meals. Patient taking differently: Take 237 mLs by mouth daily as needed (nutritional supplement).  03/11/14  Yes Maryann Mikhail, DO  folic acid (FOLVITE) 784 MCG tablet Take 400 mcg by mouth every morning.   Yes Historical Provider, MD  furosemide (LASIX) 20 MG tablet TAKE 1 TABLET BY MOUTH DAILY AS NEEDED FOR SWELLING OF THE LOWER EXTREMITIES. Patient taking differently: TAKE 20 MG BY MOUTH DAILY AS NEEDED FOR SWELLING OF THE LOWER EXTREMITIES. 05/09/14  Yes Curt Bears, MD  levETIRAcetam (KEPPRA) 500 MG tablet Take 1 tablet (500 mg total) by mouth 2 (two) times daily. 05/02/14  Yes Orson Eva, MD  Polyethyl Glycol-Propyl Glycol (SYSTANE OP) Place 1 drop into both eyes daily.   Yes Historical Provider, MD  simvastatin (ZOCOR) 10 MG tablet Take 10 mg by mouth at bedtime.   Yes Historical Provider, MD    Allergies:   Allergies    Allergen Reactions  . Asa [Aspirin] Nausea Only  . Sulfa Antibiotics Nausea And Vomiting    Social History:  Ambulatory   independently   Lives at home  With family     reports that he quit smoking about 15 years ago. He has never used smokeless tobacco. He reports that he does not drink alcohol or use illicit drugs.    Family History: family history includes Cancer in his mother; Stroke in his father.    Physical Exam: Patient Vitals for the past 24 hrs:  BP Temp Temp src Pulse Resp SpO2  06/25/14 1742 117/68 mmHg - - 97 19 100 %  06/25/14 1740 117/68 mmHg - - 97 - 99 %  06/25/14 1715 - - - 99 - 99 %  06/25/14 1615 - - - - - 98 %  06/25/14 1548 112/64 mmHg 97.8 F (36.6 C) Oral 112 22 (!) 85 %    1. General:  in No Acute distress 2. Psychological: Alert and  Oriented 3. Head/ENT:   Moist Mucous Membranes  Head Non traumatic, neck supple                          Normal  Dentition 4. SKIN: normal Skin turgor,  Skin clean Dry and intact no rash 5. Heart: Regular rate and rhythm no Murmur, Rub or gallop 6. Lungs: Minimal wheezes, some occasional crackles   7. Abdomen: Soft, non-tender, Non distended 8. Lower extremities: no clubbing, cyanosis, bilateral edema family states have improved 9. Neurologically strength 5 out of 5 in all 4 extremities cranial nerves II through XII intact neurologically intact  10. MSK: Normal range of motion  body mass index is unknown because there is no weight on file.   Labs on Admission:   Results for orders placed or performed during the hospital encounter of 06/25/14 (from the past 24 hour(s))  CBC with Differential/Platelet     Status: Abnormal   Collection Time: 06/25/14  4:36 PM  Result Value Ref Range   WBC 6.1 4.0 - 10.5 K/uL   RBC 2.28 (L) 4.22 - 5.81 MIL/uL   Hemoglobin 7.2 (L) 13.0 - 17.0 g/dL   HCT 20.7 (L) 39.0 - 52.0 %   MCV 90.8 78.0 - 100.0 fL   MCH 31.6 26.0 - 34.0 pg   MCHC 34.8 30.0 - 36.0  g/dL   RDW 21.1 (H) 11.5 - 15.5 %   Platelets 68 (L) 150 - 400 K/uL   Neutrophils Relative % 93 (H) 43 - 77 %   Lymphocytes Relative 4 (L) 12 - 46 %   Monocytes Relative 3 3 - 12 %   Eosinophils Relative 0 0 - 5 %   Basophils Relative 0 0 - 1 %   Neutro Abs 5.7 1.7 - 7.7 K/uL   Lymphs Abs 0.2 (L) 0.7 - 4.0 K/uL   Monocytes Absolute 0.2 0.1 - 1.0 K/uL   Eosinophils Absolute 0.0 0.0 - 0.7 K/uL   Basophils Absolute 0.0 0.0 - 0.1 K/uL   RBC Morphology POLYCHROMASIA PRESENT   Troponin I     Status: Abnormal   Collection Time: 06/25/14  4:36 PM  Result Value Ref Range   Troponin I 0.04 (H) <0.031 ng/mL  Brain natriuretic peptide     Status: Abnormal   Collection Time: 06/25/14  4:37 PM  Result Value Ref Range   B Natriuretic Peptide 122.0 (H) 0.0 - 100.0 pg/mL    UA pending  No results found for: HGBA1C  Estimated Creatinine Clearance: 30.1 mL/min (by C-G formula based on Cr of 2).  BNP (last 3 results) No results for input(s): PROBNP in the last 8760 hours.  Other results:  I have pearsonaly reviewed this: ECG REPORT  Rate: 92  Rhythm: Normal sinus rhythm ST&T Change: No ischemic changes QTC 417   There were no vitals filed for this visit.   Cultures:    Component Value Date/Time   SDES BLOOD RIGHT ANTECUBITAL 04/19/2014 2230   SPECREQUEST BOTTLES DRAWN AEROBIC AND ANAEROBIC 5CC 04/19/2014 2230   CULT  04/19/2014 2230    NO GROWTH 5 DAYS Performed at [place] 04/26/2014 FINAL 04/19/2014 2230     Radiological Exams on Admission: Dg Elbow Complete Right  06/25/2014   CLINICAL DATA:  Acute onset of right elbow injury. Status post fall backwards, hitting window with right arm. Posterior medial right elbow hematoma. Initial encounter.  EXAM: RIGHT ELBOW - COMPLETE 3+ VIEW  COMPARISON:  None.  FINDINGS: There is  no evidence of fracture or dislocation. The visualized joint spaces are preserved. No significant joint effusion is identified. The  known soft tissue hematoma is not well characterized on radiograph.  IMPRESSION: No evidence of fracture or dislocation.   Electronically Signed   By: Garald Balding M.D.   On: 06/25/2014 17:40   Dg Chest Port 1 View  06/25/2014   CLINICAL DATA:  81 year old male with shortness of breath and weakness for 1 week. History of asthma and lung cancer.  EXAM: PORTABLE CHEST - 1 VIEW  COMPARISON:  Chest x-ray 04/19/2014.  Chest CT 06/11/2014.  FINDINGS: Post treatment related changes in the perihilar aspect of the right lung are again noted, where there is a persistent mass like opacity on today's chest x-ray, which appears slightly more prominent than prior chest x-ray from 04/19/2014. Coarse interstitial markings are noted throughout the lungs bilaterally. Mild diffuse bronchial wall thickening. Small right and trace left pleural effusions. Heart size is normal. Atherosclerosis in the thoracic aorta. Right internal jugular single-lumen porta cath with tip terminating in the superior aspect of the right atrium.  IMPRESSION: 1. Diffuse bronchial wall thickening and interstitial prominence increased compared to the prior examination. Findings may reflect an acute bronchitis, or could be related to noncardiogenic edema. 2. Post treatment related changes of prior radiation therapy in the perihilar aspect of the right lung again noted. 3. Small right and trace left pleural effusion. 4. Atherosclerosis.   Electronically Signed   By: Vinnie Langton M.D.   On: 06/25/2014 16:44    Chart has been reviewed  Family   at  Bedside  plan of care was discussed with   Wife Opal Sidles [phone], Daughter in law Patriccia  Assessment/Plan 43 year-old gentleman history of non-small cell lung cancer with metastasis to the brain followed by radiation oncology currently not on chemotherapy secondary to pancytopenia presents with worsening debility and shortness of breath and hypoxia which has been gradual in onset. Was noted to have  hemoglobin that strict and down to 7.2 and slightly elevated troponin and normal EKG no chest pain.  Present on Admission:  . SOB (shortness of breath) - multifactorial patient has known history of COPD and had been having a bit of a cough. No evidence of pneumonia on chest x-ray. No evidence of sepsis. Mild COPD exacerbation as a possibility. Patient has a mild wheezes and was found to be somewhat hypoxic. Patient has known history of lung cancer. He is thrombocytopenic at baseline no reports of chest pain. Onset of symptoms was gradual therefore PE less likely. Patient has history of pericardial effusion. Will reevaluate with echo given worsening shortness of breath  . Primary cancer of right lower lobe of lung - will need to notify Dr. Earlie Server patient is here  .  pancytopenia thought to be secondary to chemotherapy platelets is somewhat improved with hemoglobin drifting down  . COPD (chronic obstructive pulmonary disease) Will give albuterol as needed Atrovent scheduled Mucinex and doxycycline. No significant wheezing right now unsure if it is any underlying infection will hold off on cigarettes for right now and see how patient is doing  . CKD (chronic kidney disease) and baseline we'll continue to monitor  . Protein-calorie malnutrition, severe  - we will write for nutritional Consult assess prealbumin. Patient's openness 2.5 which is contributed to chronic swelling  . Peripheral edema likely multifactorial patient assess history of diastolic heart failure but does not appear to be fluid overloaded at this time. Albumen 2.5 platelet  contributing to peripheral edema  . Symptomatic anemia - patient required multiple transfusions in the past will transfuse 1 unit and monitor how patient is doing. We'll need to discuss with oncology regarding overall prognosis Elevated troponin - minimally so we'll continue to cycle cardiac enzymes and serial EKG monitor on telemetry no chest pain no EKG  changes History of metastasis to brain continue Decadron, no significant neurological changes since prior evaluation as per patient he is supposed to follow up with radiation oncology  Prophylaxis:  scd CODE STATUS:  FULL CODE  as per patient    Disposition:   To home once workup is complete and patient is stable  Other plan as per orders.   I have spent a total of 65 min on this admission is for Marcello Moores taken to discuss plan of care with family 40 minutes of face-to-face time spent  [facility] 06/25/2014, 6:32 PM  [facility]  Pager [phone]   after 2 AM please page floor coverage PA If 7AM-7PM, please contact the day team taking care of the patient  [email or web address]  Password [email or web address]

## 2014-06-26 ENCOUNTER — Inpatient Hospital Stay (HOSPITAL_COMMUNITY): Payer: Medicare Other

## 2014-06-26 ENCOUNTER — Encounter (HOSPITAL_COMMUNITY): Payer: Self-pay | Admitting: Radiology

## 2014-06-26 DIAGNOSIS — R0600 Dyspnea, unspecified: Secondary | ICD-10-CM

## 2014-06-26 DIAGNOSIS — R609 Edema, unspecified: Secondary | ICD-10-CM

## 2014-06-26 DIAGNOSIS — D649 Anemia, unspecified: Secondary | ICD-10-CM

## 2014-06-26 DIAGNOSIS — J438 Other emphysema: Secondary | ICD-10-CM

## 2014-06-26 LAB — PHOSPHORUS: PHOSPHORUS: 3.6 mg/dL (ref 2.5–4.6)

## 2014-06-26 LAB — COMPREHENSIVE METABOLIC PANEL
ALT: 17 U/L (ref 17–63)
AST: 25 U/L (ref 15–41)
Albumin: 2.2 g/dL — ABNORMAL LOW (ref 3.5–5.0)
Alkaline Phosphatase: 105 U/L (ref 38–126)
Anion gap: 8 (ref 5–15)
BUN: 34 mg/dL — ABNORMAL HIGH (ref 6–20)
CHLORIDE: 101 mmol/L (ref 101–111)
CO2: 25 mmol/L (ref 22–32)
Calcium: 8.2 mg/dL — ABNORMAL LOW (ref 8.9–10.3)
Creatinine, Ser: 1.66 mg/dL — ABNORMAL HIGH (ref 0.61–1.24)
GFR calc Af Amer: 44 mL/min — ABNORMAL LOW (ref >60)
GFR calc non Af Amer: 38 mL/min — ABNORMAL LOW (ref >60)
Glucose, Bld: 119 mg/dL — ABNORMAL HIGH (ref 65–99)
Potassium: 3.9 mmol/L (ref 3.5–5.1)
Sodium: 134 mmol/L — ABNORMAL LOW (ref 135–145)
TOTAL PROTEIN: 5.5 g/dL — AB (ref 6.5–8.1)
Total Bilirubin: 1.5 mg/dL — ABNORMAL HIGH (ref 0.3–1.2)

## 2014-06-26 LAB — TSH: TSH: 2.396 u[IU]/mL (ref 0.350–4.500)

## 2014-06-26 LAB — PROTIME-INR
INR: 1.07 (ref 0.00–1.49)
PROTHROMBIN TIME: 14.1 s (ref 11.6–15.2)

## 2014-06-26 LAB — CBC
HEMATOCRIT: 23.7 % — AB (ref 39.0–52.0)
HEMOGLOBIN: 8.3 g/dL — AB (ref 13.0–17.0)
MCH: 32.2 pg (ref 26.0–34.0)
MCHC: 35 g/dL (ref 30.0–36.0)
MCV: 91.9 fL (ref 78.0–100.0)
Platelets: 58 10*3/uL — ABNORMAL LOW (ref 150–400)
RBC: 2.58 MIL/uL — ABNORMAL LOW (ref 4.22–5.81)
RDW: 20 % — ABNORMAL HIGH (ref 11.5–15.5)
WBC: 5.4 10*3/uL (ref 4.0–10.5)

## 2014-06-26 LAB — CREATININE, URINE, RANDOM: Creatinine, Urine: 20.3 mg/dL

## 2014-06-26 LAB — MAGNESIUM: Magnesium: 1.5 mg/dL — ABNORMAL LOW (ref 1.7–2.4)

## 2014-06-26 LAB — PREALBUMIN: Prealbumin: 19.4 mg/dL (ref 18–38)

## 2014-06-26 LAB — D-DIMER, QUANTITATIVE (NOT AT ARMC): D-Dimer, Quant: 1.26 ug/mL-FEU — ABNORMAL HIGH (ref 0.00–0.48)

## 2014-06-26 LAB — SODIUM, URINE, RANDOM: Sodium, Ur: 73 mEq/L

## 2014-06-26 LAB — TROPONIN I
Troponin I: 0.04 ng/mL — ABNORMAL HIGH (ref <0.031)
Troponin I: 0.04 ng/mL — ABNORMAL HIGH (ref <0.031)

## 2014-06-26 LAB — OSMOLALITY, URINE: OSMOLALITY UR: 279 mosm/kg — AB (ref 390–1090)

## 2014-06-26 MED ORDER — MAGNESIUM SULFATE 2 GM/50ML IV SOLN
2.0000 g | Freq: Once | INTRAVENOUS | Status: AC
  Administered 2014-06-26: 2 g via INTRAVENOUS
  Filled 2014-06-26: qty 50

## 2014-06-26 MED ORDER — IOHEXOL 350 MG/ML SOLN
100.0000 mL | Freq: Once | INTRAVENOUS | Status: AC | PRN
  Administered 2014-06-26: 80 mL via INTRAVENOUS

## 2014-06-26 MED ORDER — AMOXICILLIN-POT CLAVULANATE 875-125 MG PO TABS: 1.0000 | ORAL_TABLET | Freq: Two times a day (BID) | ORAL | Status: DC

## 2014-06-26 MED ORDER — IPRATROPIUM BROMIDE 0.02 % IN SOLN
0.5000 mg | RESPIRATORY_TRACT | Status: DC | PRN
Start: 2014-06-26 — End: 2014-07-06
  Administered 2014-06-30: 0.5 mg via RESPIRATORY_TRACT
  Filled 2014-06-26: qty 2.5

## 2014-06-26 MED ORDER — SODIUM CHLORIDE 0.9 % IV SOLN
INTRAVENOUS | Status: DC
  Administered 2014-06-26 – 2014-06-28 (×2): 75 mL/h via INTRAVENOUS
  Administered 2014-06-29 (×2): via INTRAVENOUS
  Administered 2014-07-04: 500 mL via INTRAVENOUS

## 2014-06-26 NOTE — Progress Notes (Signed)
[facility] PROGRESS NOTE  CHADD TOLLISON TMH:[id number] DOB: 29-Jan-1933 DOA: 06/25/2014 PCP: [name], MD  Assessment/Plan: 1. Hypoxia 1. Appropriate O2 sats on min O2 2. On ambulation, pt noted to become markedly hypoxic with sats into the 70's 3. Concerns of bronchitis on imaging 4. Given cancer hx, there is high concern for PE 5. Will check Ddimer. If pos, will check VQ scan (will try to avoid CT w/ contrast given GFR of 38) 2. RLLL lung cancer 1. Followed by Oncology as an outpatient 3. COPD 1. Stable 2.  No wheezing on exam 4. CKD3 1. Seems stable 2. Would try to avoid nephrotoxic agents 5. Severe protein calorie malnutrition 1. Nutrition to follow 6. Peripheral Edema 1. 2d echo obtained, pending results 7. Chronic anemia 1. Pt is s/p 1 unit of PRBC with appropriate correction 8. DVT prophylaxis 1. SCD's currently  Code Status: Full Family Communication: Pt in room (indicate person spoken with, relationship, and if by phone, the number) Disposition Plan: Pending   Consultants:    Procedures:    Antibiotics:  Doxycycline 6/10>>> (indicate start date, and stop date if known)  HPI/Subjective: Eager to go home today  Objective: Filed Vitals:   06/26/14 0628 06/26/14 0814 06/26/14 0818 06/26/14 1402  BP:    128/78  Pulse:    112  Temp: 97.6 F (36.4 C)   98 F (36.7 C)  TempSrc: Oral   Oral  Resp: 18   19  Height:      Weight:      SpO2: 98% 95% 95% 97%    Intake/Output Summary (Last 24 hours) at 06/26/14 1521 Last data filed at 06/26/14 1401  Gross per 24 hour  Intake    686 ml  Output   2150 ml  Net  -1464 ml   Filed Weights   06/25/14 2100  Weight: 67.3 kg (148 lb 5.9 oz)    Exam:   General:  Awake, in nad  Cardiovascular: regular,s 1,s 2  Respiratory: normal resp effort, no wheezing  Abdomen: soft,nondistended  Musculoskeletal: perfused, no clubbing   Data Reviewed: Basic Metabolic Panel:  Recent Labs Lab  06/25/14 1636 06/26/14 0300  NA 132* 134*  Garcia 4.0 3.9  CL 98* 101  CO2 21* 25  GLUCOSE 123* 119*  BUN 37* 34*  CREATININE 1.52* 1.66*  CALCIUM 8.4* 8.2*  MG  --  1.5*  PHOS  --  3.6   Liver Function Tests:  Recent Labs Lab 06/25/14 1636 06/26/14 0300  AST 29 25  ALT 20 17  ALKPHOS 117 105  BILITOT 0.7 1.5*  PROT 5.8* 5.5*  ALBUMIN 2.5* 2.2*   No results for input(s): LIPASE, AMYLASE in the last 168 hours. No results for input(s): AMMONIA in the last 168 hours. CBC:  Recent Labs Lab 06/25/14 1636 06/26/14 0300  WBC 6.1 5.4  NEUTROABS 5.7  --   HGB 7.2* 8.3*  HCT 20.7* 23.7*  MCV 90.8 91.9  PLT 68* 58*   Cardiac Enzymes:  Recent Labs Lab 06/25/14 1636 06/25/14 2150 06/26/14 0300 06/26/14 0915  TROPONINI 0.04* 0.04* 0.04* 0.04*   BNP (last 3 results)  Recent Labs  04/19/14 1453 06/25/14 1637  BNP 69.1 122.0*    ProBNP (last 3 results) No results for input(s): PROBNP in the last 8760 hours.  CBG: No results for input(s): GLUCAP in the last 168 hours.  No results found for this or any previous visit (from the past 240 hour(s)).   Studies: Dg  Chest 2 View  06/26/2014   CLINICAL DATA:  Cough. Shortness of breath and weakness. History of right lower lobe lung cancer status post radiation and chemotherapy.  EXAM: CHEST  2 VIEW  COMPARISON:  06/25/2014  FINDINGS: Right jugular Port-A-Cath remains in place with tip overlying the cavoatrial junction. Cardiac silhouette is within normal limits for size. Thoracic aortic calcification is noted. Post treatment changes are again seen in the right hilar region with persistent masslike opacity in architectural distortion, similar to the prior study. Interstitial markings remain coarse diffusely, not significantly changed. Patchy, slightly more confluent opacities in both lower lungs appear slightly increased, likely in the right middle lobe and lingula. A small right pleural effusion is again seen, unchanged. No  pneumothorax. No acute osseous abnormality is identified.  IMPRESSION: Similar appearance of diffuse interstitial densities, which may reflect acute bronchitis or edema, with minimally increased patchy opacities in both lung bases which may reflect superimposed pneumonia. Small right pleural effusion.   Electronically Signed   By: Logan Bores   On: 06/26/2014 09:18   Dg Elbow Complete Right  06/25/2014   CLINICAL DATA:  Acute onset of right elbow injury. Status post fall backwards, hitting window with right arm. Posterior medial right elbow hematoma. Initial encounter.  EXAM: RIGHT ELBOW - COMPLETE 3+ VIEW  COMPARISON:  None.  FINDINGS: There is no evidence of fracture or dislocation. The visualized joint spaces are preserved. No significant joint effusion is identified. The known soft tissue hematoma is not well characterized on radiograph.  IMPRESSION: No evidence of fracture or dislocation.   Electronically Signed   By: Garald Balding M.D.   On: 06/25/2014 17:40   Dg Chest Port 1 View  06/25/2014   CLINICAL DATA:  81 year old male with shortness of breath and weakness for 1 week. History of asthma and lung cancer.  EXAM: PORTABLE CHEST - 1 VIEW  COMPARISON:  Chest x-ray 04/19/2014.  Chest CT 06/11/2014.  FINDINGS: Post treatment related changes in the perihilar aspect of the right lung are again noted, where there is a persistent mass like opacity on today's chest x-ray, which appears slightly more prominent than prior chest x-ray from 04/19/2014. Coarse interstitial markings are noted throughout the lungs bilaterally. Mild diffuse bronchial wall thickening. Small right and trace left pleural effusions. Heart size is normal. Atherosclerosis in the thoracic aorta. Right internal jugular single-lumen porta cath with tip terminating in the superior aspect of the right atrium.  IMPRESSION: 1. Diffuse bronchial wall thickening and interstitial prominence increased compared to the prior examination. Findings may  reflect an acute bronchitis, or could be related to noncardiogenic edema. 2. Post treatment related changes of prior radiation therapy in the perihilar aspect of the right lung again noted. 3. Small right and trace left pleural effusion. 4. Atherosclerosis.   Electronically Signed   By: Vinnie Langton M.D.   On: 06/25/2014 16:44    Scheduled Meds: . dexamethasone  4 mg Oral BID  . docusate sodium  100 mg Oral BID  . doxycycline  100 mg Oral Q12H  . guaiFENesin  600 mg Oral BID  . levETIRAcetam  500 mg Oral BID  . simvastatin  10 mg Oral QHS  . sodium chloride  10-40 mL Intracatheter Q12H  . sodium chloride  3 mL Intravenous Q12H   Continuous Infusions:   Active Problems:   COPD (chronic obstructive pulmonary disease)   Primary cancer of right lower lobe of lung   Symptomatic anemia   Peripheral edema  Other pancytopenia   SOB (shortness of breath)   CKD (chronic kidney disease)   Protein-calorie malnutrition, severe    [name], [name]  [facility] Pager [phone]. If 7PM-7AM, please contact night-coverage at [email or web address], password [facility] 06/26/2014, 3:21 PM  LOS: 1 day

## 2014-06-26 NOTE — Treatment Plan (Addendum)
Chart thoroughly reviewed. Pt has a hx of being worked up multiple times for PE with CT angio as well as multiple other CT images involving contrast with no reported ill effects. Renal function has historically remained with Cr in the 1.5-1.6 range. As such, will order CT angio to r/o PE as pt's d.dimer is elevated in the setting of hypoxia and sinus tach in a pt who is hypercoagulable. Will continue on basal IVF to minimize chance of renal injury. Monitor renal function closely

## 2014-06-26 NOTE — Progress Notes (Signed)
SATURATION QUALIFICATIONS: (This note is used to comply with regulatory documentation for home oxygen)  Patient Saturations on Room Air at Rest = *98%  Patient Saturations on Room Air while Ambulating = 76%  Patient Saturations on 2 Liters of oxygen while/post Ambulating = 91%  Please briefly explain why patient needs home oxygen:

## 2014-06-26 NOTE — Progress Notes (Signed)
  Echocardiogram 2D Echocardiogram has been performed.  Darlina Sicilian M 06/26/2014, 2:06 PM

## 2014-06-27 LAB — BASIC METABOLIC PANEL
Anion gap: 8 (ref 5–15)
BUN: 35 mg/dL — ABNORMAL HIGH (ref 6–20)
CO2: 25 mmol/L (ref 22–32)
Calcium: 8.4 mg/dL — ABNORMAL LOW (ref 8.9–10.3)
Chloride: 102 mmol/L (ref 101–111)
Creatinine, Ser: 1.55 mg/dL — ABNORMAL HIGH (ref 0.61–1.24)
GFR calc non Af Amer: 41 mL/min — ABNORMAL LOW (ref >60)
GFR, EST AFRICAN AMERICAN: 48 mL/min — AB (ref >60)
GLUCOSE: 125 mg/dL — AB (ref 65–99)
Potassium: 3.9 mmol/L (ref 3.5–5.1)
SODIUM: 135 mmol/L (ref 135–145)

## 2014-06-27 LAB — STREP PNEUMONIAE URINARY ANTIGEN: Strep Pneumo Urinary Antigen: NEGATIVE

## 2014-06-27 LAB — CBC
HEMATOCRIT: 25.5 % — AB (ref 39.0–52.0)
HEMOGLOBIN: 8.7 g/dL — AB (ref 13.0–17.0)
MCH: 31.5 pg (ref 26.0–34.0)
MCHC: 34.1 g/dL (ref 30.0–36.0)
MCV: 92.4 fL (ref 78.0–100.0)
Platelets: 57 10*3/uL — ABNORMAL LOW (ref 150–400)
RBC: 2.76 MIL/uL — AB (ref 4.22–5.81)
RDW: 21 % — AB (ref 11.5–15.5)
WBC: 6.2 10*3/uL (ref 4.0–10.5)

## 2014-06-27 LAB — PROCALCITONIN: Procalcitonin: 0.28 ng/mL

## 2014-06-27 MED ORDER — LEVOFLOXACIN 750 MG PO TABS: 750.0000 mg | ORAL_TABLET | ORAL | Status: DC

## 2014-06-27 MED ORDER — DEXTROSE 5 % IV SOLN
2.0000 g | Freq: Two times a day (BID) | INTRAVENOUS | Status: DC
  Administered 2014-06-27 – 2014-07-01 (×8): 2 g via INTRAVENOUS
  Filled 2014-06-27 (×9): qty 2

## 2014-06-27 MED ORDER — LEVOFLOXACIN IN D5W 750 MG/150ML IV SOLN: 750.0000 mg | INTRAVENOUS | Status: DC

## 2014-06-27 MED ORDER — LEVOFLOXACIN 750 MG PO TABS
750.0000 mg | ORAL_TABLET | ORAL | Status: DC
  Administered 2014-06-27: 750 mg via ORAL
  Filled 2014-06-27: qty 1

## 2014-06-27 MED ORDER — VANCOMYCIN HCL IN DEXTROSE 1-5 GM/200ML-% IV SOLN
1000.0000 mg | INTRAVENOUS | Status: DC
  Administered 2014-06-27 – 2014-06-30 (×4): 1000 mg via INTRAVENOUS
  Filled 2014-06-27 (×6): qty 200

## 2014-06-27 MED ORDER — ENSURE ENLIVE PO LIQD
237.0000 mL | Freq: Two times a day (BID) | ORAL | Status: DC
  Administered 2014-06-27 – 2014-07-06 (×14): 237 mL via ORAL

## 2014-06-27 NOTE — Progress Notes (Signed)
[facility] PROGRESS NOTE  [name] JIR:[id number] DOB: 1933/07/12 DOA: 06/25/2014 PCP: [name], MD  Assessment/Plan: 1. Hypoxia with B pneumonia, likely HCAP 1. Min O2 support while at rest 2. On ambulation, pt noted to become markedly hypoxic with sats into the 70's 3. Concerns of bronchitis on initial imaging 4. Mildly elevated d-dimer with follow up CTA chest neg for PE, however was notable for B PNA 5. Pt febrile this AM. Will broaden abx to vanc and fortaz 6. No leukocytosis 2. RLLL lung cancer 1. Followed by Oncology as an outpatient 3. COPD 1. Stable 2.  No wheezing on exam 4. CKD3 1. Seems stable 2. Would try to avoid nephrotoxic agents 5. Severe protein calorie malnutrition 1. Nutrition to follow 6. Peripheral Edema 1. 2d echo obtained, pending results 7. Chronic anemia 1. Pt is s/p 1 unit of PRBC with appropriate correction 2. Hgb remains stable 8. DVT prophylaxis 1. SCD's currently  Code Status: Full Family Communication: Pt in room, family in room Disposition Plan: Pending   Consultants:    Procedures:    Antibiotics:  Doxycycline 6/10>>>6/12 (indicate start date, and stop date if known)  Vancomycin 6/12>>>  Tressie Ellis 6/12>>>  HPI/Subjective: Eager to go home. Reports chills and weakness  Objective: Filed Vitals:   06/26/14 1435 06/26/14 1437 06/26/14 2204 06/27/14 0646  BP:   137/82 122/70  Pulse:   122 94  Temp:   98.4 F (36.9 C) 97.6 F (36.4 C)  TempSrc:   Oral Oral  Resp:   18 18  Height:      Weight:      SpO2: 73% 91% 96% 96%    Intake/Output Summary (Last 24 hours) at 06/27/14 1328 Last data filed at 06/27/14 0700  Gross per 24 hour  Intake   1890 ml  Output   3225 ml  Net  -1335 ml   Filed Weights   06/25/14 2100  Weight: 67.3 kg (148 lb 5.9 oz)    Exam:   General:  Awake, laying in bed,, in nad  Cardiovascular: regular,s 1,s 2  Respiratory: normal resp effort, no wheezing  Abdomen:  soft,nondistended, pos bs  Musculoskeletal: perfused, no clubbing   Data Reviewed: Basic Metabolic Panel:  Recent Labs Lab 06/25/14 1636 06/26/14 0300 06/27/14 0452  NA 132* 134* 135  Garcia 4.0 3.9 3.9  CL 98* 101 102  CO2 21* 25 25  GLUCOSE 123* 119* 125*  BUN 37* 34* 35*  CREATININE 1.52* 1.66* 1.55*  CALCIUM 8.4* 8.2* 8.4*  MG  --  1.5*  --   PHOS  --  3.6  --    Liver Function Tests:  Recent Labs Lab 06/25/14 1636 06/26/14 0300  AST 29 25  ALT 20 17  ALKPHOS 117 105  BILITOT 0.7 1.5*  PROT 5.8* 5.5*  ALBUMIN 2.5* 2.2*   No results for input(s): LIPASE, AMYLASE in the last 168 hours. No results for input(s): AMMONIA in the last 168 hours. CBC:  Recent Labs Lab 06/25/14 1636 06/26/14 0300 06/27/14 0452  WBC 6.1 5.4 6.2  NEUTROABS 5.7  --   --   HGB 7.2* 8.3* 8.7*  HCT 20.7* 23.7* 25.5*  MCV 90.8 91.9 92.4  PLT 68* 58* 57*   Cardiac Enzymes:  Recent Labs Lab 06/25/14 1636 06/25/14 2150 06/26/14 0300 06/26/14 0915  TROPONINI 0.04* 0.04* 0.04* 0.04*   BNP (last 3 results)  Recent Labs  04/19/14 1453 06/25/14 1637  BNP 69.1 122.0*    ProBNP (last  3 results) No results for input(s): PROBNP in the last 8760 hours.  CBG: No results for input(s): GLUCAP in the last 168 hours.  No results found for this or any previous visit (from the past 240 hour(s)).   Studies: Dg Chest 2 View  06/26/2014   CLINICAL DATA:  Cough. Shortness of breath and weakness. History of right lower lobe lung cancer status post radiation and chemotherapy.  EXAM: CHEST  2 VIEW  COMPARISON:  06/25/2014  FINDINGS: Right jugular Port-A-Cath remains in place with tip overlying the cavoatrial junction. Cardiac silhouette is within normal limits for size. Thoracic aortic calcification is noted. Post treatment changes are again seen in the right hilar region with persistent masslike opacity in architectural distortion, similar to the prior study. Interstitial markings remain coarse  diffusely, not significantly changed. Patchy, slightly more confluent opacities in both lower lungs appear slightly increased, likely in the right middle lobe and lingula. A small right pleural effusion is again seen, unchanged. No pneumothorax. No acute osseous abnormality is identified.  IMPRESSION: Similar appearance of diffuse interstitial densities, which may reflect acute bronchitis or edema, with minimally increased patchy opacities in both lung bases which may reflect superimposed pneumonia. Small right pleural effusion.   Electronically Signed   By: Logan Bores   On: 06/26/2014 09:18   Dg Elbow Complete Right  06/25/2014   CLINICAL DATA:  Acute onset of right elbow injury. Status post fall backwards, hitting window with right arm. Posterior medial right elbow hematoma. Initial encounter.  EXAM: RIGHT ELBOW - COMPLETE 3+ VIEW  COMPARISON:  None.  FINDINGS: There is no evidence of fracture or dislocation. The visualized joint spaces are preserved. No significant joint effusion is identified. The known soft tissue hematoma is not well characterized on radiograph.  IMPRESSION: No evidence of fracture or dislocation.   Electronically Signed   By: Garald Balding M.D.   On: 06/25/2014 17:40   Ct Angio Chest Pe W/cm &/or Wo Cm  06/26/2014   CLINICAL DATA:  Acute onset of shortness of breath and cough. Current history of lung cancer, recently completed chemotherapy. Severe hypoxia on ambulation. Initial encounter.  EXAM: CT ANGIOGRAPHY CHEST WITH CONTRAST  TECHNIQUE: Multidetector CT imaging of the chest was performed using the standard protocol during bolus administration of intravenous contrast. Multiplanar CT image reconstructions and MIPs were obtained to evaluate the vascular anatomy.  CONTRAST:  53m OMNIPAQUE IOHEXOL 350 MG/ML SOLN  COMPARISON:  Chest radiograph performed earlier today at 8:44 a.m., and CT of the chest performed 06/11/2014  FINDINGS: There is no evidence of pulmonary embolus.  There is  diffuse hazy airspace opacification noted involving much of the left lower lobe and lingula, and portions of the right middle and lower lobes, concerning for multifocal pneumonia.  The underlying cavitary mass at the right infrahilar region appears mildly increased in size from the prior study, measuring approximately 4.4 x 3.1 x 3.5 cm. An additional smaller nodule within the right lower lobe is also slightly more prominent, measuring 1.9 cm. A mildly increased small right pleural effusion is noted. Underlying extensive post radiation change is noted at the right lung.  Underlying emphysematous change is seen bilaterally. No pneumothorax is identified.  A right-sided chest port is noted ending about the cavoatrial junction. Trace pericardial fluid remains within normal limits. A mildly prominent subcarinal node is seen, measuring 1.1 cm in short axis. No additional mediastinal lymphadenopathy is seen. The great vessels are grossly unremarkable. No axillary lymphadenopathy is seen. The  thyroid gland is unremarkable in appearance.  The visualized portions of the liver and spleen are unremarkable. The patient is status post cholecystectomy, with clips noted at the gallbladder fossa.  No acute osseous abnormalities are seen. There is a chronic compression deformity involving the superior endplate of H96.  Review of the MIP images confirms the above findings.  IMPRESSION: 1. No evidence of pulmonary embolus. 2. Diffuse hazy airspace opacification noted involving much of the left lower lobe and lingula, and portions of the right middle and lower lobes, concerning for multifocal pneumonia. 3. Underlying cavitary mass at the right infrahilar region appears mildly increased in size, measuring 4.4 x 3.1 x 3.5 cm. Additional smaller nodule at the right lower lobe is also slightly more prominent, measuring 1.9 cm. This is concerning for mild interval progression of lung cancer. 4. Mildly increased small right pleural effusion  noted. 5. Underlying bilateral emphysematous change seen. 6. Mildly prominent subcarinal node, measuring 1.1 cm in short axis, more prominent than on the prior study.   Electronically Signed   By: Garald Balding M.D.   On: 06/26/2014 19:32   Dg Chest Port 1 View  06/25/2014   CLINICAL DATA:  81 year old male with shortness of breath and weakness for 1 week. History of asthma and lung cancer.  EXAM: PORTABLE CHEST - 1 VIEW  COMPARISON:  Chest x-ray 04/19/2014.  Chest CT 06/11/2014.  FINDINGS: Post treatment related changes in the perihilar aspect of the right lung are again noted, where there is a persistent mass like opacity on today's chest x-ray, which appears slightly more prominent than prior chest x-ray from 04/19/2014. Coarse interstitial markings are noted throughout the lungs bilaterally. Mild diffuse bronchial wall thickening. Small right and trace left pleural effusions. Heart size is normal. Atherosclerosis in the thoracic aorta. Right internal jugular single-lumen porta cath with tip terminating in the superior aspect of the right atrium.  IMPRESSION: 1. Diffuse bronchial wall thickening and interstitial prominence increased compared to the prior examination. Findings may reflect an acute bronchitis, or could be related to noncardiogenic edema. 2. Post treatment related changes of prior radiation therapy in the perihilar aspect of the right lung again noted. 3. Small right and trace left pleural effusion. 4. Atherosclerosis.   Electronically Signed   By: Vinnie Langton M.D.   On: 06/25/2014 16:44    Scheduled Meds: . dexamethasone  4 mg Oral BID  . docusate sodium  100 mg Oral BID  . feeding supplement (ENSURE ENLIVE)  237 mL Oral BID BM  . guaiFENesin  600 mg Oral BID  . levETIRAcetam  500 mg Oral BID  . levofloxacin  750 mg Oral Q48H  . simvastatin  10 mg Oral QHS  . sodium chloride  10-40 mL Intracatheter Q12H  . sodium chloride  3 mL Intravenous Q12H   Continuous Infusions: .  sodium chloride 75 mL/hr (06/26/14 2158)    Active Problems:   COPD (chronic obstructive pulmonary disease)   Primary cancer of right lower lobe of lung   Symptomatic anemia   Peripheral edema   Other pancytopenia   SOB (shortness of breath)   CKD (chronic kidney disease)   Protein-calorie malnutrition, severe    [name]  [facility] Pager [phone]. If 7PM-7AM, please contact night-coverage at [email or web address], password [facility] 06/27/2014, 1:28 PM  LOS: 2 days

## 2014-06-27 NOTE — Progress Notes (Addendum)
ANTIBIOTIC CONSULT NOTE - INITIAL  Pharmacy Consult for Levaquin --> Vancomycin, Ceftazidime Indication: pneumonia  Allergies  Allergen Reactions  . Asa [Aspirin] Nausea Only  . Sulfa Antibiotics Nausea And Vomiting    Patient Measurements: Height: '5\' 11"'$  (180.3 cm) Weight: 148 lb 5.9 oz (67.3 kg) (wearing street clothes) IBW/kg (Calculated) : 75.3  Vital Signs: Temp: 97.6 F (36.4 C) (06/12 0646) Temp Source: Oral (06/12 0646) BP: 122/70 mmHg (06/12 0646) Pulse Rate: 94 (06/12 0646) Intake/Output from previous day: 06/11 0701 - 06/12 0700 In: 1890 [P.O.:1080; I.V.:810] Out: 3675 [Urine:3675] Intake/Output from this shift:    Labs:  Recent Labs  06/25/14 1636 06/25/14 2306 06/26/14 0300 06/27/14 0452  WBC 6.1  --  5.4 6.2  HGB 7.2*  --  8.3* 8.7*  PLT 68*  --  58* 57*  LABCREA  --  20.3  --   --   CREATININE 1.52*  --  1.66* 1.55*   Estimated Creatinine Clearance: 37.4 mL/min (by C-G formula based on Cr of 1.55). No results for input(s): VANCOTROUGH, VANCOPEAK, VANCORANDOM, GENTTROUGH, GENTPEAK, GENTRANDOM, TOBRATROUGH, TOBRAPEAK, TOBRARND, AMIKACINPEAK, AMIKACINTROU, AMIKACIN in the last 72 hours.   Microbiology: No results found for this or any previous visit (from the past 720 hour(s)).  Medical History: Past Medical History  Diagnosis Date  . COPD (chronic obstructive pulmonary disease)   . Allergy     allergic rhinitis  . Hx of radiation therapy 12/09/12-01/01/13    lung 37.5Gy  . nscl ca w/ brain mets dx'd 08/2012    Patient has a lung mass which is being evaluated    Assessment: 44 yoM presents with hypoxia, concerns of bronchitis on imaging.  Will be worked up for PE as well. Pharmacy consulted to begin PO Levaquin for possible HCAP.  6/10 >> Zosyn x 1 6/10 >> Doxycycline >> 6/12  6/12 >> Levaquin >>  Afebrile WBC WNL Renal: SCr 1.55, CrCl~37 ml/min  Goal of Therapy:  Doses adjusted per renal function Eradication of infection  Plan:   1.  Levaquin 750 mg PO q48h. 2.  F/u SCr, culture course, clinical course.  Hershal Coria 06/27/2014,9:26 AM   ADDENDUM: 06/27/2014 3:03 PM Pharmacy consulted to start vancomycin and ceftazidime.  Plan: 1.  Vancomycin 1g IV q24h. 2.  Ceftazidime 2g IV q12h.  Hershal Coria, PharmD, BCPS Pager: [phone] 06/27/2014 3:12 PM

## 2014-06-27 NOTE — Discharge Instructions (Signed)

## 2014-06-27 NOTE — Care Management Note (Signed)
Case Management Note  Patient Details  Name: [name] MRN: [id number] Date of Birth: May 03, 1933  Subjective/Objective:      Lung cancer, hypoxia            Action/Plan: Home with wife, Kapil Petropoulos # [phone]  Expected Discharge Date:   06/28/2014               Expected Discharge Plan:  [place]  In-House Referral:     Discharge planning Services  CM Consult  Post Acute Care Choice:  Home Health Choice offered to:  Spouse  DME Arranged:  Oxygen DME Agency:  [place]:    [place]:     Status of Service:  Completed, signed off  Medicare Important Message Given:    Date Medicare IM Given:    Medicare IM give by:    Date Additional Medicare IM Given:    Additional Medicare Important Message give by:     If discussed at [place] of Stay Meetings, dates discussed:    Additional Comments: 06/27/2014 1400 NCM spoke to wife, [name]. States pt will not dc home today.  Waiting for PT eval for possible SNF placement. Discharge to home was cancelled. Jonnie Finner RN CCM Case Mgmt phone [phone]  06/27/2014, 1300 PM Consulted for oxygen for home. Contacted [facility] for oxygen at home.  Erenest Rasher, RN 06/27/2014, 2:05 PM

## 2014-06-27 NOTE — Progress Notes (Signed)
Initial Nutrition Assessment  DOCUMENTATION CODES:  Severe malnutrition in context of chronic illness  INTERVENTION:  Ensure Enlive (each supplement provides 350kcal and 20 grams of protein) BID Encourage PO intake RD to continue to monitor  NUTRITION DIAGNOSIS:  Malnutrition related to chronic illness as evidenced by 11 percent weight loss, severe depletion of body fat, severe depletion of muscle mass.  GOAL:  Patient will meet greater than or equal to 90% of their needs  MONITOR:  PO intake, Supplement acceptance, Labs, Weight trends, Skin, I & O's  REASON FOR ASSESSMENT:  Consult Assessment of nutrition requirement/status  ASSESSMENT: 81 y.o. male has a past medical history of COPD (chronic obstructive pulmonary disease); Allergy; radiation therapy (12/09/12-01/01/13); and nscl ca w/ brain mets (dx'd 08/2012). 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  Pt reports good appetite PTA and currently. PO intake: 100%. Pt's biggest factor effecting intake is that he does not like the hospital's food. Pt states that he can get his wife or family to bring in food for him. Pt has been trying to drink Ensure supplements at home. RD to order BID.  Pt with 18 lb weight loss since 4/11 (11% weight loss x 2 months, significant for time frame).  Nutrition-Focused physical exam completed. Findings are severe fat depletion, severe muscle depletion, and mild edema.   Labs reviewed: Elevated BUN & Creatinine  Height:  Ht Readings from Last 1 Encounters:  06/25/14 '5\' 11"'$  (1.803 m)    Weight:  Wt Readings from Last 1 Encounters:  06/25/14 148 lb 5.9 oz (67.3 kg)    Ideal Body Weight:  78.2 kg  Wt Readings from Last 10 Encounters:  06/25/14 148 lb 5.9 oz (67.3 kg)  06/17/14 154 lb 4.8 oz (69.99 kg)  05/10/14 145 lb 1.6 oz (65.817 kg)  05/02/14  158 lb 1.1 oz (71.7 kg)  04/13/14 161 lb 3.2 oz (73.12 kg)  03/16/14 151 lb 14.4 oz (68.901 kg)  03/07/14 159 lb (72.122 kg)  02/16/14 158 lb 6.4 oz (71.85 kg)  02/04/14 156 lb 1.6 oz (70.806 kg)  01/26/14 159 lb 8 oz (72.349 kg)    BMI:  Body mass index is 20.7 kg/(m^2).  Estimated Nutritional Needs:  Kcal:  2000-2200  Protein:  100-110g  Fluid:  2L/day    Skin:  Reviewed, no issues  Diet Order:  Diet regular Room service appropriate?: Yes; Fluid consistency:: Thin  EDUCATION NEEDS:  No education needs identified at this time   Intake/Output Summary (Last 24 hours) at 06/27/14 1212 Last data filed at 06/27/14 0700  Gross per 24 hour  Intake   1890 ml  Output   3225 ml  Net  -1335 ml    Last BM:  6/11  Clayton Bibles, MS, RD, LDN Pager: [phone] After Hours Pager: [phone]

## 2014-06-28 LAB — CBC
HEMATOCRIT: 23.8 % — AB (ref 39.0–52.0)
Hemoglobin: 8 g/dL — ABNORMAL LOW (ref 13.0–17.0)
MCH: 30.9 pg (ref 26.0–34.0)
MCHC: 33.6 g/dL (ref 30.0–36.0)
MCV: 91.9 fL (ref 78.0–100.0)
Platelets: 58 10*3/uL — ABNORMAL LOW (ref 150–400)
RBC: 2.59 MIL/uL — ABNORMAL LOW (ref 4.22–5.81)
RDW: 20.8 % — AB (ref 11.5–15.5)
WBC: 6.7 10*3/uL (ref 4.0–10.5)

## 2014-06-28 LAB — BASIC METABOLIC PANEL
ANION GAP: 9 (ref 5–15)
BUN: 42 mg/dL — ABNORMAL HIGH (ref 6–20)
CHLORIDE: 103 mmol/L (ref 101–111)
CO2: 24 mmol/L (ref 22–32)
Calcium: 8.5 mg/dL — ABNORMAL LOW (ref 8.9–10.3)
Creatinine, Ser: 1.92 mg/dL — ABNORMAL HIGH (ref 0.61–1.24)
GFR calc Af Amer: 37 mL/min — ABNORMAL LOW (ref >60)
GFR calc non Af Amer: 32 mL/min — ABNORMAL LOW (ref >60)
Glucose, Bld: 131 mg/dL — ABNORMAL HIGH (ref 65–99)
Potassium: 4 mmol/L (ref 3.5–5.1)
Sodium: 136 mmol/L (ref 135–145)

## 2014-06-28 LAB — TYPE AND SCREEN
ABO/RH(D): O POS
ANTIBODY SCREEN: NEGATIVE
Unit division: 0

## 2014-06-28 LAB — LEGIONELLA ANTIGEN, URINE

## 2014-06-28 LAB — HIV ANTIBODY (ROUTINE TESTING W REFLEX): HIV Screen 4th Generation wRfx: NONREACTIVE

## 2014-06-28 LAB — BRAIN NATRIURETIC PEPTIDE: B Natriuretic Peptide: 118.1 pg/mL — ABNORMAL HIGH (ref 0.0–100.0)

## 2014-06-28 NOTE — Clinical Social Work Note (Signed)
Clinical Social Work Assessment  Patient Details  Name: [name] MRN: [id number] Date of Birth: [birth year]  Date of referral:  06/28/14               Reason for consult:  Discharge Planning                Permission sought to share information with:  Family Supports Permission granted to share information::  Yes, Verbal Permission Granted  Name::     [name]  Agency::     Relationship::  wife  Contact Information:  [phone]  Housing/Transportation Living arrangements for the past 2 months:  [place] of Information:  Patient, Spouse Patient Interpreter Needed:  None Criminal Activity/Legal Involvement Pertinent to Current Situation/Hospitalization:  No - Comment as needed Significant Relationships:  Spouse Lives with:  Spouse Do you feel safe going back to the place where you live?  No Need for family participation in patient care:  Yes (Comment) (pt request CSW to contact pt wife)  Care giving concerns:  Pt admitted from home with pt wife. PT/OT recommendation for SNF.    Social Worker assessment / plan:  CSW received referral for new SNF.  CSW met with pt at bedside. CSW introduced self and explained role. Pt reports that he lives at home with pt wife. CSW discussed recommendation for short term rehab at SNF prior to returning home with pt wife. CSW provided support as pt discussed that he was initially hesitant about SNF, but he is now agreeable to short term rehab prior to returning home. CSW discussed process of SNF placement and pt reports that he lives in [place]. Pt requested CSW contacted pt wife, [name] via telephone.   CSW contacted pt wife, [name] via telephone. CSW introduced self and explained role. CSW updated pt wife that pt is agreeable to short term rehab at SNF prior to returning home. Pt wife expressed interest in [facility] as it is convenient to pt home.   CSW completed FL2 and initiated SNF search to  [facility]. CSW contacted [facility] and notified of pt interest in the facility.  CSW to follow up with pt and pt wife regarding SNF bed offers.  CSW to continue to follow to provide support and assist with pt disposition needs.   Employment status:  Retired Nurse, adult PT Recommendations:  [place] / Referral to community resources:  [place]  Patient/Family's Response to care:  Pt alert and oriented x 4. Pt initially hesitant about SNF, but recognizes that he needs to get stronger and his oxygen needs will need to be monitored at SNF prior to returning home.  Patient/Family's Understanding of and Emotional Response to Diagnosis, Current Treatment, and Prognosis:  Pt and pt wife have a good understanding about pt diagnosis and plan for treatment.   Emotional Assessment Appearance:  Appears younger than stated age Attitude/Demeanor/Rapport:    Affect (typically observed):  Quiet Orientation:  Oriented to Self, Oriented to Place, Oriented to  Time, Oriented to Situation Alcohol / Substance use:  Not Applicable Psych involvement (Current and /or in the community):  No (Comment)  Discharge Needs  Concerns to be addressed:  Discharge Planning Concerns Readmission within the last 30 days:  No Current discharge risk:  Physical Impairment Barriers to Discharge:  Continued Medical Work up   Alison Murray A, LCSW 06/28/2014, 1:23 PM  [phone]

## 2014-06-28 NOTE — Evaluation (Addendum)
Physical Therapy Evaluation Patient Details Name: [name] MRN: [id number] DOB: 06-07-1933 Today's Date: 06/28/2014   SATURATION QUALIFICATIONS: (This note is used to comply with regulatory documentation for home oxygen)  Patient Saturations on Room Air at Rest = 85%  Patient Saturations on Room Air while Ambulating = N/A  Patient Saturations on 3 Liters of oxygen while Ambulating = 70%     History of Present Illness  81 yo male admitted with SOB. Hx of COPD, lung cancer with brain mets.   Clinical Impression  On eval, pt was Min guard assist for mobility-able to ambulate ~75 feet with RW. Noted dyspnea with all activity on today. Multiple rest breaks needed during session. Pt was 87-88% on 2L at rest at start of session. VCs for deep breathing-sats increased to 91%. Increased O2 to 3L for ambulation-sats dropped to 70%. At least 8-10 minutes for O2 sat recovery to 88% on 3L with seated rest and VCs for deep/pursed lip breathing. Made RN aware of readings and that pt was left on 3L at end of sesson. Discussed d/c plan with pt- pt did not agree to ST rehab placement during session. However feel pt/family will have difficulty at home. Pt has several steps to enter home and 1 flight to get up to bedroom. Instructed pt to have wife place chair for him to sit and rest at top of entry and in-home stairs to allow for seated rest breaks, if needed.     Follow Up Recommendations SNF-if pt will agree;Supervison/Assistance - 24 hour (pt not agreeable to placement-discussed during PT session so HHPT if he discharges home)    Equipment Recommendations  None recommended by PT    Recommendations for Other Services       Precautions / Restrictions Precautions Precautions: Fall Precaution Comments: monitor sats Restrictions Weight Bearing Restrictions: No      Mobility  Bed Mobility Overal bed mobility: Needs Assistance Bed Mobility: Supine to Sit     Supine to sit: Modified  independent (Device/Increase time)        Transfers Overall transfer level: Needs assistance   Transfers: Sit to/from Stand Sit to Stand: Min guard;From elevated surface         General transfer comment: close guard for safety. VCs hand placemenet  Ambulation/Gait Ambulation/Gait assistance: Min guard Ambulation Distance (Feet): 75 Feet Assistive device: Rolling walker (2 wheeled) Gait Pattern/deviations: Step-through pattern;Decreased stride length;Trunk flexed     General Gait Details: close guard for safety. Cues for posture, distance from walker throughout ambulation. Dyspnea 3/4. O2 sats down to 70% on 3L.   Stairs            Wheelchair Mobility    Modified Rankin (Stroke Patients Only)       Balance Overall balance assessment: Needs assistance         Standing balance support: Bilateral upper extremity supported;During functional activity Standing balance-Leahy Scale: Poor                               Pertinent Vitals/Pain Pain Assessment: No/denies pain    Home Living Family/patient expects to be discharged to:: Private residence Living Arrangements: Spouse/significant other   Type of Home: House Home Access: Stairs to enter Entrance Stairs-Rails: Right Entrance Stairs-Number of Steps: 7 Home Layout: Two level;Able to live on main level with bedroom/bathroom Home Equipment: Gilford Rile - 2 wheels;Cane - single point;Shower seat;Walker - 4 wheels  Prior Function Level of Independence: Needs assistance   Gait / Transfers Assistance Needed: uses walker primarily  ADL's / Homemaking Assistance Needed: pt reports he is able to perform bathing,dressing unassisted. gets into walk in shower        Hand Dominance        Extremity/Trunk Assessment   Upper Extremity Assessment: Defer to OT evaluation           Lower Extremity Assessment: Generalized weakness      Cervical / Trunk Assessment: Kyphotic  Communication    Communication: HOH  Cognition Arousal/Alertness: Awake/alert Behavior During Therapy: WFL for tasks assessed/performed Overall Cognitive Status: Within Functional Limits for tasks assessed                      General Comments      Exercises        Assessment/Plan    PT Assessment Patient needs continued PT services  PT Diagnosis Generalized weakness;Difficulty walking   PT Problem List Decreased strength;Decreased activity tolerance;Decreased balance;Decreased mobility;Decreased knowledge of use of DME  PT Treatment Interventions Gait training;DME instruction;Functional mobility training;Therapeutic activities;Therapeutic exercise;Patient/family education;Balance training   PT Goals (Current goals can be found in the Care Plan section) Acute Rehab PT Goals Patient Stated Goal: home soon PT Goal Formulation: With patient Time For Goal Achievement: 07/12/14 Potential to Achieve Goals: Fair    Frequency Min 3X/week   Barriers to discharge        Co-evaluation               End of Session Equipment Utilized During Treatment: Gait belt;Oxygen Activity Tolerance: Patient tolerated treatment well Patient left: in chair;with call bell/phone within reach           Time: 0927-1014 PT Time Calculation (min) (ACUTE ONLY): 47 min   Charges:   PT Evaluation $Initial PT Evaluation Tier I: 1 Procedure PT Treatments $Gait Training: 8-22 mins $Therapeutic Activity: 8-22 mins   PT G Codes:        Weston Anna, MPT Pager: [phone]

## 2014-06-28 NOTE — Clinical Social Work Placement (Signed)
   CLINICAL SOCIAL WORK PLACEMENT  NOTE  Date:  06/28/2014  Patient Details  Name: [name] MRN: [id number] Date of Birth: February 05, 1933  Clinical Social Work is seeking post-discharge placement for this patient at the [place] level of care (*CSW will initial, date and re-position this form in  chart as items are completed):  Yes   Patient/family provided with [place] Work Department's list of facilities offering this level of care within the geographic area requested by the patient (or if unable, by the patient's family).  Yes   Patient/family informed of their freedom to choose among providers that offer the needed level of care, that participate in Medicare, Medicaid or managed care program needed by the patient, have an available bed and are willing to accept the patient.  Yes   Patient/family informed of [place]'s ownership interest in [facility] and [facility], as well as of the fact that they are under no obligation to receive care at these facilities.  PASRR submitted to EDS on 06/28/14     PASRR number received on 06/28/14     Existing PASRR number confirmed on       FL2 transmitted to all facilities in geographic area requested by pt/family on 06/28/14     FL2 transmitted to all facilities within larger geographic area on       Patient informed that his/her managed care company has contracts with or will negotiate with certain facilities, including the following:            Patient/family informed of bed offers received.  Patient chooses bed at       Physician recommends and patient chooses bed at      Patient to be transferred to   on  .  Patient to be transferred to facility by       Patient family notified on   of transfer.  Name of family member notified:        PHYSICIAN Please sign FL2     Additional Comment:    _______________________________________________ Ladell Pier, LCSW 06/28/2014, 1:31  PM

## 2014-06-28 NOTE — Progress Notes (Signed)
CSW continuing to follow.   CSW followed up with pt at bedside regarding SNF bed offers.   CSWprovided and reviewed SNF bed offers with pt and clarified pt questions.   Pt chooses bed at [facility].   CSW contacted pt wife via telephone to discuss pt choice of [facility] and pt wife agreeable to plan for [facility].   CSW contacted [facility] and notified facility of pt acceptance of bed offer. [facility] confirmed that facility can accept pt when medically ready for discharge.  CSW to continue to follow to provide support and assist with pt discharge to [facility] and Rehab when medically ready for discharge.  Alison Murray, MSW, [place] Work [phone]

## 2014-06-28 NOTE — Progress Notes (Signed)
[facility] PROGRESS NOTE  ARJEN DERINGER MMH:[id number] DOB: [birth year] DOA: 06/25/2014 PCP: [name], MD  Assessment/Plan: 1. Hypoxia with B pneumonia, likely HCAP 1. Still quiet hypoxic with ambulation.  2. Concerns of bronchitis on initial imaging 3. Mildly elevated d-dimer with follow up CTA chest neg for PE, however was notable for B PNA 4. Cont vanc and fortaz 5. No leukocytosis 2. RLLL lung cancer 1. Followed by Oncology as an outpatient 3. COPD 1. Stable 2.  No wheezing on exam 4. CKD3 1. Seems stable 2. Would try to avoid nephrotoxic agents 5. Severe protein calorie malnutrition 1. Nutrition to follow 6. Peripheral Edema 1. 2d echo obtained, pending results 7. Chronic anemia 1. Pt is s/p 1 unit of PRBC with appropriate correction 2. Hgb remains stable 8. DVT prophylaxis 1. SCD's currently  Code Status: Full Family Communication: Pt in room, family in room Disposition Plan: Pending   Consultants:    Procedures:    Antibiotics:  Doxycycline 6/10>>>6/12 (indicate start date, and stop date if known)  Vancomycin 6/12>>>  Tressie Ellis 6/12>>>  HPI/Subjective: No cough, short of breath when walking with PT.   Objective: Filed Vitals:   06/27/14 0646 06/27/14 1622 06/27/14 2120 06/28/14 0632  BP: 122/70 115/70 108/73 115/76  Pulse: 94 128 108 110  Temp: 97.6 F (36.4 C) 98.7 F (37.1 C) 98.7 F (37.1 C) 98.8 F (37.1 C)  TempSrc: Oral Oral Oral Oral  Resp: '18 24 22 20  '$ Height:      Weight:      SpO2: 96% 91% 91% 94%    Intake/Output Summary (Last 24 hours) at 06/28/14 1344 Last data filed at 06/28/14 1300  Gross per 24 hour  Intake   1655 ml  Output   2475 ml  Net   -820 ml   Filed Weights   06/25/14 2100  Weight: 67.3 kg (148 lb 5.9 oz)    Exam:   General:  Awake, laying in bed,, in nad  Cardiovascular: regular,s 1,s 2  Respiratory: normal resp effort- coarse crackles left > right lung field  Abdomen: soft,nondistended,  pos bs  Musculoskeletal: perfused, no clubbing   Data Reviewed: Basic Metabolic Panel:  Recent Labs Lab 06/25/14 1636 06/26/14 0300 06/27/14 0452 06/28/14 0506  NA 132* 134* 135 136  K 4.0 3.9 3.9 4.0  CL 98* 101 102 103  CO2 21* '25 25 24  '$ GLUCOSE 123* 119* 125* 131*  BUN 37* 34* 35* 42*  CREATININE 1.52* 1.66* 1.55* 1.92*  CALCIUM 8.4* 8.2* 8.4* 8.5*  MG  --  1.5*  --   --   PHOS  --  3.6  --   --    Liver Function Tests:  Recent Labs Lab 06/25/14 1636 06/26/14 0300  AST 29 25  ALT 20 17  ALKPHOS 117 105  BILITOT 0.7 1.5*  PROT 5.8* 5.5*  ALBUMIN 2.5* 2.2*   No results for input(s): LIPASE, AMYLASE in the last 168 hours. No results for input(s): AMMONIA in the last 168 hours. CBC:  Recent Labs Lab 06/25/14 1636 06/26/14 0300 06/27/14 0452 06/28/14 0506  WBC 6.1 5.4 6.2 6.7  NEUTROABS 5.7  --   --   --   HGB 7.2* 8.3* 8.7* 8.0*  HCT 20.7* 23.7* 25.5* 23.8*  MCV 90.8 91.9 92.4 91.9  PLT 68* 58* 57* 58*   Cardiac Enzymes:  Recent Labs Lab 06/25/14 1636 06/25/14 2150 06/26/14 0300 06/26/14 0915  TROPONINI 0.04* 0.04* 0.04* 0.04*   BNP (last  3 results)  Recent Labs  04/19/14 1453 06/25/14 1637  BNP 69.1 122.0*    ProBNP (last 3 results) No results for input(s): PROBNP in the last 8760 hours.  CBG: No results for input(s): GLUCAP in the last 168 hours.  Recent Results (from the past 240 hour(s))  Culture, blood (routine x 2) Call MD if unable to obtain prior to antibiotics being given     Status: None (Preliminary result)   Collection Time: 06/27/14  3:55 PM  Result Value Ref Range Status   Specimen Description BLOOD LEFT ARM  Final   Special Requests   Final    BOTTLES DRAWN AEROBIC AND ANAEROBIC [place] BOTH BOTTLES   Culture   Final           BLOOD CULTURE RECEIVED NO GROWTH TO DATE CULTURE WILL BE HELD FOR 5 DAYS BEFORE ISSUING A FINAL NEGATIVE REPORT Performed at [facility]    Report Status PENDING  Incomplete  Culture,  blood (routine x 2) Call MD if unable to obtain prior to antibiotics being given     Status: None (Preliminary result)   Collection Time: 06/27/14  4:00 PM  Result Value Ref Range Status   Specimen Description BLOOD LEFT ARM  Final   Special Requests   Final    BOTTLES DRAWN AEROBIC AND ANAEROBIC  [place] BOTH BOTTLES   Culture   Final           BLOOD CULTURE RECEIVED NO GROWTH TO DATE CULTURE WILL BE HELD FOR 5 DAYS BEFORE ISSUING A FINAL NEGATIVE REPORT Performed at [facility]    Report Status PENDING  Incomplete     Studies: Ct Angio Chest Pe W/cm &/or Wo Cm  06/26/2014   CLINICAL DATA:  Acute onset of shortness of breath and cough. Current history of lung cancer, recently completed chemotherapy. Severe hypoxia on ambulation. Initial encounter.  EXAM: CT ANGIOGRAPHY CHEST WITH CONTRAST  TECHNIQUE: Multidetector CT imaging of the chest was performed using the standard protocol during bolus administration of intravenous contrast. Multiplanar CT image reconstructions and MIPs were obtained to evaluate the vascular anatomy.  CONTRAST:  22m OMNIPAQUE IOHEXOL 350 MG/ML SOLN  COMPARISON:  Chest radiograph performed earlier today at 8:44 a.m., and CT of the chest performed 06/11/2014  FINDINGS: There is no evidence of pulmonary embolus.  There is diffuse hazy airspace opacification noted involving much of the left lower lobe and lingula, and portions of the right middle and lower lobes, concerning for multifocal pneumonia.  The underlying cavitary mass at the right infrahilar region appears mildly increased in size from the prior study, measuring approximately 4.4 x 3.1 x 3.5 cm. An additional smaller nodule within the right lower lobe is also slightly more prominent, measuring 1.9 cm. A mildly increased small right pleural effusion is noted. Underlying extensive post radiation change is noted at the right lung.  Underlying emphysematous change is seen bilaterally. No pneumothorax is identified.  A  right-sided chest port is noted ending about the cavoatrial junction. Trace pericardial fluid remains within normal limits. A mildly prominent subcarinal node is seen, measuring 1.1 cm in short axis. No additional mediastinal lymphadenopathy is seen. The great vessels are grossly unremarkable. No axillary lymphadenopathy is seen. The thyroid gland is unremarkable in appearance.  The visualized portions of the liver and spleen are unremarkable. The patient is status post cholecystectomy, with clips noted at the gallbladder fossa.  No acute osseous abnormalities are seen. There is a chronic compression deformity  involving the superior endplate of W09.  Review of the MIP images confirms the above findings.  IMPRESSION: 1. No evidence of pulmonary embolus. 2. Diffuse hazy airspace opacification noted involving much of the left lower lobe and lingula, and portions of the right middle and lower lobes, concerning for multifocal pneumonia. 3. Underlying cavitary mass at the right infrahilar region appears mildly increased in size, measuring 4.4 x 3.1 x 3.5 cm. Additional smaller nodule at the right lower lobe is also slightly more prominent, measuring 1.9 cm. This is concerning for mild interval progression of lung cancer. 4. Mildly increased small right pleural effusion noted. 5. Underlying bilateral emphysematous change seen. 6. Mildly prominent subcarinal node, measuring 1.1 cm in short axis, more prominent than on the prior study.   Electronically Signed   By: Garald Balding M.D.   On: 06/26/2014 19:32    Scheduled Meds: . cefTAZidime (FORTAZ)  IV  2 g Intravenous Q12H  . dexamethasone  4 mg Oral BID  . docusate sodium  100 mg Oral BID  . feeding supplement (ENSURE ENLIVE)  237 mL Oral BID BM  . guaiFENesin  600 mg Oral BID  . levETIRAcetam  500 mg Oral BID  . simvastatin  10 mg Oral QHS  . sodium chloride  10-40 mL Intracatheter Q12H  . sodium chloride  3 mL Intravenous Q12H  . vancomycin  1,000 mg  Intravenous Q24H   Continuous Infusions: . sodium chloride 75 mL/hr (06/28/14 1237)       [facility]  [facility] Pager [phone]  If 7PM-7AM, please contact night-coverage at [email or web address], password [facility] 06/28/2014, 1:44 PM  LOS: 3 days

## 2014-06-28 NOTE — Evaluation (Signed)
Occupational Therapy Evaluation Patient Details Name: [name] MRN: [id number] DOB: 09-10-1933 Today's Date: 06/28/2014    History of Present Illness 81 yo male admitted with SOB. Hx of COPD, lung cancer with brain mets.    Clinical Impression   Pt admitted with SOB. Pt currently with functional limitations due to the deficits listed below (see OT Problem List).  Pt will benefit from skilled OT to increase their safety and independence with ADL and functional mobility for ADL to facilitate discharge to venue listed below.      Follow Up Recommendations  SNF    Equipment Recommendations  3 in 1 bedside comode    Recommendations for Other Services       Precautions / Restrictions Precautions Precautions: Fall Precaution Comments: monitor sats Restrictions Weight Bearing Restrictions: No      Mobility Bed Mobility Overal bed mobility: Needs Assistance Bed Mobility: Supine to Sit     Supine to sit: Modified independent (Device/Increase time)     General bed mobility comments: pt in chair  Transfers Overall transfer level: Needs assistance Equipment used: Rolling walker (2 wheeled) Transfers: Sit to/from Stand Sit to Stand: Min assist         General transfer comment: close guard for safety. VCs hand placemenet    Balance Overall balance assessment: Needs assistance         Standing balance support: Bilateral upper extremity supported;During functional activity Standing balance-Leahy Scale: Poor                              ADL Overall ADL's : Needs assistance/impaired Eating/Feeding: Set up;Sitting   Grooming: Set up;Sitting   Upper Body Bathing: Sitting;Set up   Lower Body Bathing: Moderate assistance;Sit to/from stand   Upper Body Dressing : Set up;Sitting   Lower Body Dressing: Moderate assistance;Sit to/from stand   Toilet Transfer: BSC;RW;Minimal assistance   Toileting- Water quality scientist and Hygiene: Moderate  assistance;Sit to/from stand         General ADL Comments: Pts sats on 3L dropped to 71 with sit to stand activity. Took 9 minutes for pt to reach 90. MD present and aware and turned o2 up to 5L. RN aware               Pertinent Vitals/Pain Pain Assessment: No/denies pain     Hand Dominance     Extremity/Trunk Assessment Upper Extremity Assessment Upper Extremity Assessment: Generalized weakness   Lower Extremity Assessment Lower Extremity Assessment: Generalized weakness   Cervical / Trunk Assessment Cervical / Trunk Assessment: Kyphotic   Communication Communication Communication: HOH   Cognition Arousal/Alertness: Awake/alert Behavior During Therapy: WFL for tasks assessed/performed Overall Cognitive Status: Within Functional Limits for tasks assessed                                Home Living Family/patient expects to be discharged to:: Private residence Living Arrangements: Spouse/significant other   Type of Home: House Home Access: Stairs to enter [employer] of Steps: 7 Entrance Stairs-Rails: Right Home Layout: Two level;Able to live on main level with bedroom/bathroom     Bathroom Shower/Tub: Walk-in shower   Bathroom Toilet: Handicapped height     Home Equipment: Environmental consultant - 2 wheels;Cane - single point;Shower seat;Walker - 4 wheels          Prior Functioning/Environment Level of Independence: Needs assistance  Gait / Transfers  Assistance Needed: uses walker primarily ADL's / Homemaking Assistance Needed: pt reports he is able to perform bathing,dressing unassisted. gets into walk in shower        OT Diagnosis: Generalized weakness   OT Problem List: Decreased strength;Decreased activity tolerance;Cardiopulmonary status limiting activity   OT Treatment/Interventions: Self-care/ADL training;Energy conservation;Patient/family education;DME and/or AE instruction    OT Goals(Current goals can be found in the care plan  section) Acute Rehab OT Goals Patient Stated Goal: home soon OT Goal Formulation: With patient Time For Goal Achievement: 07/12/14  OT Frequency: Min 2X/week    End of Session Nurse Communication: Mobility status;Other (comment) (oxygen saturation)  Activity Tolerance: Other (comment) (limited by oxygen saturation dropping) Patient left: in chair;with call bell/phone within reach   Time: 1120-1138 OT Time Calculation (min): 18 min Charges:  OT General Charges $OT Visit: 1 Procedure OT Evaluation $Initial OT Evaluation Tier I: 1 Procedure G-Codes:    Betsy Pries 07-17-2014, 11:46 AM

## 2014-06-28 NOTE — Clinical Social Work Placement (Signed)
   CLINICAL SOCIAL WORK PLACEMENT  NOTE  Date:  06/28/2014  Patient Details  Name: [name] MRN: [id number] Date of Birth: April 29, 1933  Clinical Social Work is seeking post-discharge placement for this patient at the [place] level of care (*CSW will initial, date and re-position this form in  chart as items are completed):  Yes   Patient/family provided with [place] Work Department's list of facilities offering this level of care within the geographic area requested by the patient (or if unable, by the patient's family).  Yes   Patient/family informed of their freedom to choose among providers that offer the needed level of care, that participate in Medicare, Medicaid or managed care program needed by the patient, have an available bed and are willing to accept the patient.  Yes   Patient/family informed of [place]'s ownership interest in [facility] and [facility], as well as of the fact that they are under no obligation to receive care at these facilities.  PASRR submitted to EDS on 06/28/14     PASRR number received on 06/28/14     Existing PASRR number confirmed on       FL2 transmitted to all facilities in geographic area requested by pt/family on 06/28/14     FL2 transmitted to all facilities within larger geographic area on       Patient informed that his/her managed care company has contracts with or will negotiate with certain facilities, including the following:        Yes   Patient/family informed of bed offers received.  Patient [facility] bed at [facility]     Physician recommends and patient chooses bed at      Patient to be transferred to   on  .  Patient to be transferred to facility by       Patient family notified on   of transfer.  Name of family member notified:        PHYSICIAN Please sign FL2     Additional Comment:    _______________________________________________ Ladell Pier,  LCSW 06/28/2014, 3:33 PM

## 2014-06-29 ENCOUNTER — Inpatient Hospital Stay (HOSPITAL_COMMUNITY): Payer: Medicare Other

## 2014-06-29 LAB — CBC
HEMATOCRIT: 25 % — AB (ref 39.0–52.0)
HEMOGLOBIN: 8.5 g/dL — AB (ref 13.0–17.0)
MCH: 30.9 pg (ref 26.0–34.0)
MCHC: 34 g/dL (ref 30.0–36.0)
MCV: 90.9 fL (ref 78.0–100.0)
Platelets: 60 10*3/uL — ABNORMAL LOW (ref 150–400)
RBC: 2.75 MIL/uL — ABNORMAL LOW (ref 4.22–5.81)
RDW: 20.8 % — ABNORMAL HIGH (ref 11.5–15.5)
WBC: 7.5 10*3/uL (ref 4.0–10.5)

## 2014-06-29 LAB — BASIC METABOLIC PANEL
Anion gap: 11 (ref 5–15)
BUN: 42 mg/dL — ABNORMAL HIGH (ref 6–20)
CO2: 23 mmol/L (ref 22–32)
CREATININE: 1.74 mg/dL — AB (ref 0.61–1.24)
Calcium: 8.6 mg/dL — ABNORMAL LOW (ref 8.9–10.3)
Chloride: 101 mmol/L (ref 101–111)
GFR calc non Af Amer: 36 mL/min — ABNORMAL LOW (ref >60)
GFR, EST AFRICAN AMERICAN: 41 mL/min — AB (ref >60)
Glucose, Bld: 155 mg/dL — ABNORMAL HIGH (ref 65–99)
POTASSIUM: 3.6 mmol/L (ref 3.5–5.1)
Sodium: 135 mmol/L (ref 135–145)

## 2014-06-29 LAB — PROCALCITONIN: PROCALCITONIN: 0.67 ng/mL

## 2014-06-29 NOTE — Progress Notes (Addendum)
[facility] PROGRESS NOTE  [name] QHU:[id number] DOB: 11/01/1933 DOA: 06/25/2014 PCP: [name], MD   Off Service Summary [phone] with hx of stage 4 adenocarcinoma of the lung who initially presented with new hypoxia. Initial imaging was suggestive of bronchitis and the pt was continued on empiric doxycycline. The patient remained symptomatic and follow up CTA chest was neg for PE, however was notable for B pna. ABX were ultimately transitioned to cover HCAP. The patient has since remained O2 dependent with follow up CXR demonstrating worsening PNA.  Assessment/Plan: 1. Hypoxia with B pneumonia, likely HCAP 1. Still quiet hypoxic with ambulation.  2. Concerns of bronchitis on initial imaging 3. Mildly elevated d-dimer with follow up CTA chest neg for PE, however was notable for B PNA 4. Pt is continued on vanc and fortaz for HCAP 5. Remains without leukocytosis 6. Repeat cxr today with worsening pna compared to 6/11. 7. For now, will continue current regimen and repeat cxr in AM for interval change 8. Pt remains O2 dependent. Cont to wean as tolerated 2. RLLL lung cancer 1. Followed by Oncology as an outpatient 3. COPD 1. Stable 2.  No wheezing on exam 4. CKD3 1. Cr higher than baseline 2. Cont to avoid nephrotoxic agents if possible 5. Severe protein calorie malnutrition 1. Nutrition to follow 6. Peripheral Edema 1. 2d echo obtained, unremarkable 7. Chronic anemia 1. Pt is s/p 1 unit of PRBC with appropriate correction 2. Hgb remains stable 8. DVT prophylaxis 1. SCD's currently  Code Status: Full Family Communication: Pt in room, family in room Disposition Plan: Pending   Consultants:    Procedures:    Antibiotics:  Doxycycline 6/10>>>6/12 (indicate start date, and stop date if known)  Vancomycin 6/12>>>  Tressie Ellis 6/12>>>  HPI/Subjective: Feels more weak today. Sob on exertion.  Objective: Filed Vitals:   06/29/14 0547 06/29/14 0940 06/29/14  0943 06/29/14 1411  BP: 117/74   109/65  Pulse: 98   120  Temp: 97.7 F (36.5 C)   97.7 F (36.5 C)  TempSrc: Oral   Oral  Resp: 20   20  Height:      Weight:      SpO2: 92% 86% 92% 91%    Intake/Output Summary (Last 24 hours) at 06/29/14 1745 Last data filed at 06/29/14 1013  Gross per 24 hour  Intake 1286.25 ml  Output   2650 ml  Net -1363.75 ml   Filed Weights   06/25/14 2100  Weight: 67.3 kg (148 lb 5.9 oz)    Exam:   General:  Awake, laying in bed,, in nad  Cardiovascular: regular,s 1,s 2  Respiratory: slightly increased resp effort, no wheezing  Abdomen: soft,nondistended, pos bs  Musculoskeletal: perfused, no clubbing   Data Reviewed: Basic Metabolic Panel:  Recent Labs Lab 06/25/14 1636 06/26/14 0300 06/27/14 0452 06/28/14 0506 06/29/14 1112  NA 132* 134* 135 136 135  K 4.0 3.9 3.9 4.0 3.6  CL 98* 101 102 103 101  CO2 21* '25 25 24 23  '$ GLUCOSE 123* 119* 125* 131* 155*  BUN 37* 34* 35* 42* 42*  CREATININE 1.52* 1.66* 1.55* 1.92* 1.74*  CALCIUM 8.4* 8.2* 8.4* 8.5* 8.6*  MG  --  1.5*  --   --   --   PHOS  --  3.6  --   --   --    Liver Function Tests:  Recent Labs Lab 06/25/14 1636 06/26/14 0300  AST 29 25  ALT 20 17  ALKPHOS 117  105  BILITOT 0.7 1.5*  PROT 5.8* 5.5*  ALBUMIN 2.5* 2.2*   No results for input(s): LIPASE, AMYLASE in the last 168 hours. No results for input(s): AMMONIA in the last 168 hours. CBC:  Recent Labs Lab 06/25/14 1636 06/26/14 0300 06/27/14 0452 06/28/14 0506 06/29/14 1112  WBC 6.1 5.4 6.2 6.7 7.5  NEUTROABS 5.7  --   --   --   --   HGB 7.2* 8.3* 8.7* 8.0* 8.5*  HCT 20.7* 23.7* 25.5* 23.8* 25.0*  MCV 90.8 91.9 92.4 91.9 90.9  PLT 68* 58* 57* 58* 60*   Cardiac Enzymes:  Recent Labs Lab 06/25/14 1636 06/25/14 2150 06/26/14 0300 06/26/14 0915  TROPONINI 0.04* 0.04* 0.04* 0.04*   BNP (last 3 results)  Recent Labs  04/19/14 1453 06/25/14 1637 06/28/14 1306  BNP 69.1 122.0* 118.1*     ProBNP (last 3 results) No results for input(s): PROBNP in the last 8760 hours.  CBG: No results for input(s): GLUCAP in the last 168 hours.  Recent Results (from the past 240 hour(s))  Culture, blood (routine x 2) Call MD if unable to obtain prior to antibiotics being given     Status: None (Preliminary result)   Collection Time: 06/27/14  3:55 PM  Result Value Ref Range Status   Specimen Description BLOOD LEFT ARM  Final   Special Requests   Final    BOTTLES DRAWN AEROBIC AND ANAEROBIC [place]   Culture   Final           BLOOD CULTURE RECEIVED NO GROWTH TO DATE CULTURE WILL BE HELD FOR 5 DAYS BEFORE ISSUING A FINAL NEGATIVE REPORT Performed at [facility]    Report Status PENDING  Incomplete  Culture, blood (routine x 2) Call MD if unable to obtain prior to antibiotics being given     Status: None (Preliminary result)   Collection Time: 06/27/14  4:00 PM  Result Value Ref Range Status   Specimen Description BLOOD LEFT ARM  Final   Special Requests   Final    BOTTLES DRAWN AEROBIC AND ANAEROBIC  [place] BOTH BOTTLES   Culture   Final           BLOOD CULTURE RECEIVED NO GROWTH TO DATE CULTURE WILL BE HELD FOR 5 DAYS BEFORE ISSUING A FINAL NEGATIVE REPORT Performed at [facility]    Report Status PENDING  Incomplete     Studies: Dg Chest Port 1 View  06/29/2014   CLINICAL DATA:  Healthcare associated pneumonia. Shortness of breath and hypoxia. Metastatic right lower lobe lung carcinoma. Chronic kidney disease.  EXAM: PORTABLE CHEST - 1 VIEW  COMPARISON:  06/26/2014  FINDINGS: Heart size remains stable and within normal limits. Right hilar mass remains stable. Tiny right pleural effusion is unchanged.  Diffuse bilateral pulmonary airspace disease appears increased since previous study, which may be due to diffuse pulmonary edema or infection. Right-sided power port remains in appropriate position. No pneumothorax visualized.  IMPRESSION: Mild worsening of  diffuse bilateral pulmonary airspace disease, which may be due to diffuse pulmonary edema or infection.  No significant change in right hilar mass and small right pleural effusion.   Electronically Signed   By: Earle Gell M.D.   On: 06/29/2014 09:25    Scheduled Meds: . cefTAZidime (FORTAZ)  IV  2 g Intravenous Q12H  . dexamethasone  4 mg Oral BID  . docusate sodium  100 mg Oral BID  . feeding supplement (ENSURE ENLIVE)  237  mL Oral BID BM  . guaiFENesin  600 mg Oral BID  . levETIRAcetam  500 mg Oral BID  . simvastatin  10 mg Oral QHS  . sodium chloride  10-40 mL Intracatheter Q12H  . sodium chloride  3 mL Intravenous Q12H  . vancomycin  1,000 mg Intravenous Q24H   Continuous Infusions: . sodium chloride 75 mL/hr at 06/29/14 0534       Garcia, Preston Melter  [facility] Pager [phone]  If 7PM-7AM, please contact night-coverage at [email or web address], password [facility] 06/29/2014, 5:45 PM  LOS: 4 days

## 2014-06-29 NOTE — Progress Notes (Signed)
CSW continuing to follow.   Pt plans for [facility] for rehabilitation following hospitalization.   Per MD, pt not yet medically ready for discharge today as pt needs continued antibiotics for pneumonia.   CSW met with pt and pt wife, Opal Sidles at bedside. CSW provided support and notified pt and pt wife that CSW will keep [facility] updated.   CSW contacted [facility] and updated facility.   CSW to continue to follow to provide support and assist with pt disposition needs to [facility] when medically ready for discharge.  Alison Murray, MSW, [place] [employer] [phone]

## 2014-06-30 ENCOUNTER — Ambulatory Visit
Admission: RE | Admit: 2014-06-30 | Discharge: 2014-06-30 | Disposition: A | Discharge status: Home / Self Care | Payer: Medicare Other | Source: Ambulatory Visit | Attending: Radiation Oncology | Admitting: Radiation Oncology

## 2014-06-30 ENCOUNTER — Inpatient Hospital Stay (HOSPITAL_COMMUNITY): Payer: Medicare Other

## 2014-06-30 DIAGNOSIS — C3431 Malignant neoplasm of lower lobe, right bronchus or lung: Secondary | ICD-10-CM

## 2014-06-30 DIAGNOSIS — C7931 Secondary malignant neoplasm of brain: Secondary | ICD-10-CM

## 2014-06-30 LAB — CBC
HCT: 23.4 % — ABNORMAL LOW (ref 39.0–52.0)
Hemoglobin: 7.9 g/dL — ABNORMAL LOW (ref 13.0–17.0)
MCH: 30.9 pg (ref 26.0–34.0)
MCHC: 33.8 g/dL (ref 30.0–36.0)
MCV: 91.4 fL (ref 78.0–100.0)
PLATELETS: 59 10*3/uL — AB (ref 150–400)
RBC: 2.56 MIL/uL — AB (ref 4.22–5.81)
RDW: 20.9 % — AB (ref 11.5–15.5)
WBC: 6.9 10*3/uL (ref 4.0–10.5)

## 2014-06-30 LAB — BASIC METABOLIC PANEL
ANION GAP: 10 (ref 5–15)
BUN: 42 mg/dL — ABNORMAL HIGH (ref 6–20)
CO2: 23 mmol/L (ref 22–32)
Calcium: 8.5 mg/dL — ABNORMAL LOW (ref 8.9–10.3)
Chloride: 104 mmol/L (ref 101–111)
Creatinine, Ser: 1.61 mg/dL — ABNORMAL HIGH (ref 0.61–1.24)
GFR calc Af Amer: 46 mL/min — ABNORMAL LOW (ref >60)
GFR, EST NON AFRICAN AMERICAN: 39 mL/min — AB (ref >60)
Glucose, Bld: 146 mg/dL — ABNORMAL HIGH (ref 65–99)
Potassium: 3.6 mmol/L (ref 3.5–5.1)
Sodium: 137 mmol/L (ref 135–145)

## 2014-06-30 LAB — VANCOMYCIN, TROUGH: VANCOMYCIN TR: 16 ug/mL (ref 10.0–20.0)

## 2014-06-30 NOTE — Progress Notes (Signed)
Occupational Therapy Treatment Patient Details Name: [name] MRN: [id number] DOB: 06/10/1933 Today's Date: 06/30/2014    History of present illness 81 yo male admitted with SOB. Hx of COPD, lung cancer with brain mets.    OT comments  Watch sats           Precautions / Restrictions Precautions Precautions: Fall Precaution Comments: monitor sats Restrictions Weight Bearing Restrictions: No       Mobility Bed Mobility Overal bed mobility: Needs Assistance Bed Mobility: Supine to Sit     Supine to sit: Min guard        Transfers Overall transfer level: Needs assistance Equipment used: Rolling walker (2 wheeled)   Sit to Stand: Min assist         General transfer comment: close guard for safety. VCs hand placemenet        ADL   Eating/Feeding: Set up;Sitting Eating/Feeding Details (indicate cue type and reason): sats decreased to 75 on 5 L sitting EOB Grooming: Set up;Sitting                   Toilet Transfer: Minimal assistance Toilet Transfer Details (indicate cue type and reason): VC for deep breating. Sats 67 after transfer- RN aware. Pt on 5L           General ADL Comments: Pt on 5 L this day, Pt dropped to 67 with transfer. tOok approx 8 min to return above 90      Vision                            Cognition   Behavior During Therapy: WFL for tasks assessed/performed Overall Cognitive Status: Within Functional Limits for tasks assessed                       Extremity/Trunk Assessment                          Pertinent Vitals/ Pain       Pain Assessment: No/denies pain         Frequency       Progress Toward Goals  OT Goals(current goals can now be found in the care plan section)  Progress towards OT goals: Progressing toward goals     Plan Discharge plan remains appropriate       End of Session     Activity Tolerance Other (comment)   Patient Left in chair;with call bell/phone  within reach   Nurse Communication          Time: 7096-2836 OT Time Calculation (min): 26 min  Charges: OT General Charges $OT Visit: 1 Procedure OT Treatments $Self Care/Home Management : 23-37 mins  Ardit Danh D 06/30/2014, 9:01 AM

## 2014-06-30 NOTE — Progress Notes (Signed)
Brief Pharmacy Note   See full details from note by Doreene Eland, PharmD at 07:24AM. In brief, this is a 34 y/oM being treated for HCAP (bilateral) with Vancomycin and Zosyn.   Vancomycin trough level at 1545 = 16 mcg/mL, therapeutic  Afebrile  WBC WNL  Renal: AoCKD, SCr improving. Good UOP. Contrast given 6/10   Plan: --Continue Vancomycin 1g IV q24h. --Repeat Vancomycin trough level in coming days, as clinically indicated per treatment course. --Continue Ceftazidime 2g IV q12h --Continue to monitor renal function, cultures, clinical course.    Lindell Spar, PharmD, BCPS Pager: [phone] 06/30/2014 4:59 PM

## 2014-06-30 NOTE — Progress Notes (Signed)
ANTIBIOTIC CONSULT NOTE - Follow-up  Pharmacy Consult for Vancomycin, Ceftazidime Indication: pneumonia  Allergies  Allergen Reactions  . Asa [Aspirin] Nausea Only  . Sulfa Antibiotics Nausea And Vomiting    Patient Measurements: Height: '5\' 11"'$  (180.3 cm) Weight: 138 lb 7.2 oz (62.8 kg) IBW/kg (Calculated) : 75.3  Vital Signs: Temp: 97.9 F (36.6 C) (06/15 0445) Temp Source: Oral (06/15 0445) BP: 108/66 mmHg (06/15 0445) Pulse Rate: 101 (06/15 0445) Intake/Output from previous day: 06/14 0701 - 06/15 0700 In: 2923.8 [P.O.:600; I.V.:1823.8; IV Piggyback:500] Out: 2075 [Urine:2075] Intake/Output from this shift:    Labs:  Recent Labs  06/28/14 0506 06/29/14 1112 06/30/14 0445  WBC 6.7 7.5 6.9  HGB 8.0* 8.5* 7.9*  PLT 58* 60* 59*  CREATININE 1.92* 1.74* 1.61*   Estimated Creatinine Clearance: 33.6 mL/min (by C-G formula based on Cr of 1.61). No results for input(s): VANCOTROUGH, VANCOPEAK, VANCORANDOM, GENTTROUGH, GENTPEAK, GENTRANDOM, TOBRATROUGH, TOBRAPEAK, TOBRARND, AMIKACINPEAK, AMIKACINTROU, AMIKACIN in the last 72 hours.   Microbiology: Recent Results (from the past 720 hour(s))  Culture, blood (routine x 2) Call MD if unable to obtain prior to antibiotics being given     Status: None (Preliminary result)   Collection Time: 06/27/14  3:55 PM  Result Value Ref Range Status   Specimen Description BLOOD LEFT ARM  Final   Special Requests   Final    BOTTLES DRAWN AEROBIC AND ANAEROBIC [place] BOTH BOTTLES   Culture   Final           BLOOD CULTURE RECEIVED NO GROWTH TO DATE CULTURE WILL BE HELD FOR 5 DAYS BEFORE ISSUING A FINAL NEGATIVE REPORT Performed at [facility]    Report Status PENDING  Incomplete  Culture, blood (routine x 2) Call MD if unable to obtain prior to antibiotics being given     Status: None (Preliminary result)   Collection Time: 06/27/14  4:00 PM  Result Value Ref Range Status   Specimen Description BLOOD LEFT ARM  Final   Special  Requests   Final    BOTTLES DRAWN AEROBIC AND ANAEROBIC  [place] BOTH BOTTLES   Culture   Final           BLOOD CULTURE RECEIVED NO GROWTH TO DATE CULTURE WILL BE HELD FOR 5 DAYS BEFORE ISSUING A FINAL NEGATIVE REPORT Performed at [facility]    Report Status PENDING  Incomplete    Medical History: Past Medical History  Diagnosis Date  . COPD (chronic obstructive pulmonary disease)   . Allergy     allergic rhinitis  . Hx of radiation therapy 12/09/12-01/01/13    lung 37.5Gy  . nscl ca w/ brain mets dx'd 08/2012    Patient has a lung mass which is being evaluated    Assessment: 56 yoM presents with hypoxia, concerns of bronchitis on imaging.  Will be worked up for PE as well. Pharmacy consulted to begin PO Levaquin for possible HCAP.  6/10 >> Zosyn x 1  6/10 >> Doxycycline >> 6/12  6/12 >> Levaquin >> 6/12  6/12 >> Ceftaz >> 6/12 >> Vanc >>   6/12 blood: NGTD  Sputum: to be collected  Strep Ag: neg  Legionella Ag: neg  Afebrile WBC WNL Renal: AKI on CKD improving. Good UOP. Contrast given 6/10 for CTA  Drug levels/dose changes 6/15 1530 VT = ___mcg/ml on 1gm IV q24h (prior to 4th dose)  Goal of Therapy:  Vancomycin trough 15-34mg/ml Doses adjusted per renal function Eradication of infection  Plan:  Day #3 vanco/ceftazidime  Continue vancomycin 1gm IV q24h, renal function is improving  Check trough today  Continue ceftazidime 2gm IV q12h  Doreene Eland, PharmD, BCPS.   Pager: [phone] 06/30/2014 7:24 AM

## 2014-06-30 NOTE — Progress Notes (Signed)
I spoke with the patient and his wife today. The patient was scheduled to see me as an outpatient but has been hospitalized with pneumonia.  I have been following him with a history of brain metastasis. The patient's last brain imaging was in April and at that time, with some motion degradation, no clear additional targets for radiation treatment were seen. I did see him in a previous hospital stay where we discussed having him follow-up with me as an outpatient and deciding on future imaging. However, the patient canceled this appointment with me and then has missed subsequent appointments due to additional hospital stays. At this time, the patient has other significant issues which need to be addressed. If he substantially recovers, then I would be happy to see the patient and we could discuss additional brain imaging at a later date. I discussed this plan with his wife and she will let us know what we can do in the future.

## 2014-06-30 NOTE — Progress Notes (Addendum)
Patient ID: [name], male   DOB: December 19, 1933, 81 y.o.   MRN: [id number] [facility] PROGRESS NOTE  [name] XNA:[id number] DOB: 16-May-1933 DOA: 06/25/2014 PCP: [name], MD  Brief narrative:    81 year old male with past medical history of metastatic non small cell lung cancer (follows with Dr. Julien Nordmann on oncology), brain mets status post stereotactic radiotherapy to 3 brian lesions completed in 11/2012 under Dr. Ida Rogue care, palliative radiotherapy to left lower lung mass completed 12/2012, status post systemic chemotherapy and now on maintenance chemotherapy. He presented to ED with hypoxia and was found to have bilateral pneumonia.  His hospital course is still complicated with ongoing hypoxia at rest and with exertion. Last CXR 06/30/14 showed slight increase in airspace opacity in right lower lung lobe, both, edema and pneumonia a possibility. He is on fortaz and vancomycin.  Barrier to discharge: still hypoxia and requiring IV abx for ongoing bilateral pneumonia.    Assessment/Plan:    Principal Problem: Acute respiratory failure with hypoxia / Bilateral pneumonia / Healthcare associated pneumonia  - Ongoing hypoxia likely due to combination of metastatic lung cancer and bilateral pneumonia - Continue oxygen support via [place] to keep O2 saturation above 90% - Strep pneumonia, legionella, blood cultures all negative - Continue vanco and fortaz. Based on CXR 6/15 seems to be increase in right LLL opacity.    Active Problem: Metastatic non small cell lung cancer with brain mets - Status post stereotactic radiotherapy to 3 brian lesions completed in 11/2012 under Dr. Ida Rogue care - Status post palliative radiotherapy to left lower lung mass completed 12/2012 - Status post systemic chemotherapy and now on maintenance chemotherapy, last infusion in 03/2014. Not given in 04/2014 due to intolerance and low cell counts.  Anemia of chronic disease - Secondary to lung  malignancy, chemotherapy - Hemoglobin is 7.9 this am - Transfuse if further drop noted  Chronic kidney disease, stage 3 - Creatinine at baseline 2.0 (06/17/14) - Currently 1.61 - Continue to monitor renal function   Thrombocytopenia - Due to malignancy - Continue to monitor platelet count   Brain metastases - No reports of seizures - Continue Keppra PO BID  Severe protein calorie malnutrition - In the context of chronic illness  - Nutrition consulted - Continue nutritional supplementation   Dyslipidemia - Continue statin therapy    DVT Prophylaxis  - SCD's bilaterally    Code Status: Full.  Family Communication:  plan of care discussed with the patient Disposition Plan: Home once respiratory status improves.   IV access:  Peripheral IV  Procedures and diagnostic studies:    Ct Angio Chest Pe W/cm &/or Wo Cm 06/26/2014 1. No evidence of pulmonary embolus. 2. Diffuse hazy airspace opacification noted involving much of the left lower lobe and lingula, and portions of the right middle and lower lobes, concerning for multifocal pneumonia. 3. Underlying cavitary mass at the right infrahilar region appears mildly increased in size, measuring 4.4 x 3.1 x 3.5 cm. Additional smaller nodule at the right lower lobe is also slightly more prominent, measuring 1.9 cm. This is concerning for mild interval progression of lung cancer. 4. Mildly increased small right pleural effusion noted. 5. Underlying bilateral emphysematous change seen. 6. Mildly prominent subcarinal node, measuring 1.1 cm in short axis, more prominent than on the prior study.     Dg Chest Port 1 View 06/30/2014 Slight increase in airspace opacity in the right lower lobe. There is a combination of patchy interstitial and  alveolar opacity bilaterally. Question a degree of underlying congestive heart failure. The areas of airspace opacity may represent superimposed pneumonia. Both edema and pneumonia may exist concurrently. No  change in cardiac silhouette.     Dg Chest Port 1 View 06/29/2014  Mild worsening of diffuse bilateral pulmonary airspace disease, which may be due to diffuse pulmonary edema or infection.  No significant change in right hilar mass and small right pleural effusion.     Medical Consultants:   Other Consultants:  Nutrition Physical therapy   IAnti-Infectives:   Doxycycline 6/10 -->6/12  Vancomycin 6/12 --> Tressie Ellis 6/12 -->   Bianna Haran, MD  [facility] Pager [phone]  Time spent in minutes: 25 minutes  If 7PM-7AM, please contact night-coverage [email or web address] Password [facility] 06/30/2014, 3:39 PM   LOS: 5 days    HPI/Subjective: No acute overnight events. Patient reports short of breath while moving from bed to chair.   Objective: Filed Vitals:   06/29/14 2019 06/30/14 0445 06/30/14 0859 06/30/14 1332  BP: 107/68 108/66  118/75  Pulse: 116 101  125  Temp: 97.6 F (36.4 C) 97.9 F (36.6 C)  97.9 F (36.6 C)  TempSrc: Oral Oral  Oral  Resp: '20 20  20  '$ Height:      Weight:  62.8 kg (138 lb 7.2 oz)    SpO2: 92% 94% 67% 92%    Intake/Output Summary (Last 24 hours) at 06/30/14 1539 Last data filed at 06/30/14 1050  Gross per 24 hour  Intake 3283.75 ml  Output   1950 ml  Net 1333.75 ml    Exam:   General:  Pt is alert, follows commands appropriately, not in acute distress  Cardiovascular: Regular rate and rhythm, S1/S2 appreciated   Respiratory: diminished breath sounds bilaterally, no wheezing   Abdomen: Soft, non tender, non distended, bowel sounds present  Extremities: No cyanosis, pulses DP and PT palpable bilaterally  Neuro: Grossly nonfocal  Data Reviewed: Basic Metabolic Panel:  Recent Labs Lab 06/26/14 0300 06/27/14 0452 06/28/14 0506 06/29/14 1112 06/30/14 0445  NA 134* 135 136 135 137  K 3.9 3.9 4.0 3.6 3.6  CL 101 102 103 101 104  CO2 '25 25 24 23 23  '$ GLUCOSE 119* 125* 131* 155* 146*  BUN 34* 35* 42* 42* 42*  CREATININE 1.66*  1.55* 1.92* 1.74* 1.61*  CALCIUM 8.2* 8.4* 8.5* 8.6* 8.5*  MG 1.5*  --   --   --   --   PHOS 3.6  --   --   --   --    Liver Function Tests:  Recent Labs Lab 06/25/14 1636 06/26/14 0300  AST 29 25  ALT 20 17  ALKPHOS 117 105  BILITOT 0.7 1.5*  PROT 5.8* 5.5*  ALBUMIN 2.5* 2.2*   No results for input(s): LIPASE, AMYLASE in the last 168 hours. No results for input(s): AMMONIA in the last 168 hours. CBC:  Recent Labs Lab 06/25/14 1636 06/26/14 0300 06/27/14 0452 06/28/14 0506 06/29/14 1112 06/30/14 0445  WBC 6.1 5.4 6.2 6.7 7.5 6.9  NEUTROABS 5.7  --   --   --   --   --   HGB 7.2* 8.3* 8.7* 8.0* 8.5* 7.9*  HCT 20.7* 23.7* 25.5* 23.8* 25.0* 23.4*  MCV 90.8 91.9 92.4 91.9 90.9 91.4  PLT 68* 58* 57* 58* 60* 59*   Cardiac Enzymes:  Recent Labs Lab 06/25/14 1636 06/25/14 2150 06/26/14 0300 06/26/14 0915  TROPONINI 0.04* 0.04* 0.04* 0.04*   BNP: Invalid input(s): POCBNP  CBG: No results for input(s): GLUCAP in the last 168 hours.  Culture, blood (routine x 2) Call MD if unable to obtain prior to antibiotics being given     Status: None (Preliminary result)   Collection Time: 06/27/14  3:55 PM  Result Value Ref Range Status   Specimen Description BLOOD LEFT ARM  Final   Special Requests   Final    BOTTLES DRAWN AEROBIC AND ANAEROBIC [place]   Culture   Final           BLOOD CULTURE RECEIVED NO GROWTH TO DATE CULTURE WILL BE HELD FOR 5 DAYS BEFORE ISSUING A FINAL NEGATIVE REPORT Performed at [facility]    Report Status PENDING  Incomplete  Culture, blood (routine x 2) Call MD if unable to obtain prior to antibiotics being given     Status: None (Preliminary result)   Collection Time: 06/27/14  4:00 PM  Result Value Ref Range Status   Specimen Description BLOOD LEFT ARM  Final   Special Requests   Final    BOTTLES DRAWN AEROBIC AND ANAEROBIC  [place] BOTH BOTTLES   Culture   Final           BLOOD CULTURE RECEIVED NO GROWTH TO DATE CULTURE WILL  BE HELD FOR 5 DAYS BEFORE ISSUING A FINAL NEGATIVE REPORT Performed at [facility]    Report Status PENDING  Incomplete     Scheduled Meds: . cefTAZidime (FORTAZ)  2 g Intravenous Q12H  . dexamethasone  4 mg Oral BID  . docusate sodium  100 mg Oral BID  . feeding supplement (ENSURE ENLIVE)  237 mL Oral BID BM  . guaiFENesin  600 mg Oral BID  . levETIRAcetam  500 mg Oral BID  . simvastatin  10 mg Oral QHS  . vancomycin  1,000 mg Intravenous Q24H   Continuous Infusions: . sodium chloride 75 mL/hr at 06/29/14 2108

## 2014-07-01 ENCOUNTER — Inpatient Hospital Stay (HOSPITAL_COMMUNITY): Payer: Medicare Other

## 2014-07-01 DIAGNOSIS — F4323 Adjustment disorder with mixed anxiety and depressed mood: Secondary | ICD-10-CM

## 2014-07-01 DIAGNOSIS — C3490 Malignant neoplasm of unspecified part of unspecified bronchus or lung: Secondary | ICD-10-CM | POA: Diagnosis present

## 2014-07-01 DIAGNOSIS — R0600 Dyspnea, unspecified: Secondary | ICD-10-CM

## 2014-07-01 DIAGNOSIS — Z515 Encounter for palliative care: Secondary | ICD-10-CM

## 2014-07-01 LAB — CBC
HEMATOCRIT: 23.2 % — AB (ref 39.0–52.0)
HEMOGLOBIN: 7.4 g/dL — AB (ref 13.0–17.0)
MCH: 29.1 pg (ref 26.0–34.0)
MCHC: 31.9 g/dL (ref 30.0–36.0)
MCV: 91.3 fL (ref 78.0–100.0)
Platelets: 47 10*3/uL — ABNORMAL LOW (ref 150–400)
RBC: 2.54 MIL/uL — AB (ref 4.22–5.81)
RDW: 17 % — ABNORMAL HIGH (ref 11.5–15.5)
WBC: 8.3 10*3/uL (ref 4.0–10.5)

## 2014-07-01 LAB — MRSA PCR SCREENING: MRSA by PCR: NEGATIVE

## 2014-07-01 MED ORDER — LEVOFLOXACIN IN D5W 750 MG/150ML IV SOLN
750.0000 mg | INTRAVENOUS | Status: AC
  Administered 2014-07-01 – 2014-07-05 (×3): 750 mg via INTRAVENOUS
  Filled 2014-07-01 (×3): qty 150

## 2014-07-01 MED ORDER — ALPRAZOLAM 0.25 MG PO TABS: 0.2500 mg | ORAL_TABLET | Freq: Three times a day (TID) | ORAL | Status: DC | PRN

## 2014-07-01 MED ORDER — FUROSEMIDE 10 MG/ML IJ SOLN
20.0000 mg | Freq: Once | INTRAMUSCULAR | Status: AC
  Administered 2014-07-01: 20 mg via INTRAVENOUS
  Filled 2014-07-01: qty 2

## 2014-07-01 NOTE — Progress Notes (Signed)
Nutrition Follow-up   DOCUMENTATION CODES:  Severe malnutrition in context of chronic illness  INTERVENTION: - Continue Ensure Enlive BID - RD will continue to monitor for needs  NUTRITION DIAGNOSIS:  Malnutrition related to chronic illness as evidenced by percent weight loss, severe depletion of body fat, severe depletion of muscle mass. -ongoing  GOAL:  Patient will meet greater than or equal to 90% of their needs -met with food from outside of the hospital  MONITOR:  PO intake, Supplement acceptance, Labs, Weight trends, Skin, I & O's  REASON FOR ASSESSMENT:  Consult Assessment of nutrition requirement/status  ASSESSMENT: 81 y.o. male has a past medical history of COPD (chronic obstructive pulmonary disease); Allergy; radiation therapy (12/09/12-01/01/13); and nscl ca w/ brain mets (dx'd 08/2012). Patient known history of stage IV non-small cell lung cancer with metastasis to the brain since   Pt has been eating 75-100% of meals and his family continues to bring food in for him as he does not like what is available in the hospital. He was eating [employer] sandwich and fries at time of visit. He reports good appetite and variable intakes of Ensure.  Likely meeting needs on average. Medications reviewed. Labs reviewed; BUN/creatinine elevated, Ca 8.5 mg/dL, GFR: 39.  Height:  Ht Readings from Last 1 Encounters:  06/25/14 $RemoveB'5\' 11"'frYSnghk$  (1.803 m)    Weight:  Wt Readings from Last 1 Encounters:  06/30/14 138 lb 7.2 oz (62.8 kg)    Ideal Body Weight:  78.2 kg  Wt Readings from Last 10 Encounters:  06/30/14 138 lb 7.2 oz (62.8 kg)  06/17/14 154 lb 4.8 oz (69.99 kg)  05/10/14 145 lb 1.6 oz (65.817 kg)  05/02/14 158 lb 1.1 oz (71.7 kg)  04/13/14 161 lb 3.2 oz (73.12 kg)  03/16/14 151 lb 14.4 oz (68.901 kg)  03/07/14 159 lb (72.122 kg)  02/16/14 158 lb 6.4 oz (71.85 kg)  02/04/14 156 lb 1.6 oz (70.806 kg)  01/26/14 159 lb 8 oz (72.349 kg)    BMI:  Body mass index is  19.32 kg/(m^2).  Estimated Nutritional Needs:  Kcal:  2000-2200  Protein:  100-110g  Fluid:  2L/day  Skin:  Reviewed, no issues  Diet Order:  Diet regular Room service appropriate?: Yes; Fluid consistency:: Thin  EDUCATION NEEDS:  No education needs identified at this time   Intake/Output Summary (Last 24 hours) at 07/01/14 1521 Last data filed at 07/01/14 1040  Gross per 24 hour  Intake   2131 ml  Output   1925 ml  Net    206 ml    Last BM:  6/15    Jarome Matin, RD, LDN Inpatient Clinical Dietitian Pager # [phone] After hours/weekend pager # [phone]

## 2014-07-01 NOTE — Progress Notes (Signed)
CSW continuing to follow for pt disposition needs.   Pt current plan is for [facility] upon discharge.  Per MD, pt not yet medically ready for discharge. Per MD, plan to consult palliative medicine team for overall goals of care.   CSW contacted [facility] and updated facility. CSW sent [facility] updated clinicals.   CSW to continue to follow to provide support and assist with pt disposition needs.   Alison Murray, MSW, [place] [employer] [phone]

## 2014-07-01 NOTE — Progress Notes (Signed)
ANTIBIOTIC CONSULT NOTE - Follow-up  Pharmacy Consult for Levofloxacin Indication: pneumonia  Allergies  Allergen Reactions  . Asa [Aspirin] Nausea Only  . Sulfa Antibiotics Nausea And Vomiting    Patient Measurements: Height: '5\' 11"'$  (180.3 cm) Weight: 138 lb 7.2 oz (62.8 kg) IBW/kg (Calculated) : 75.3  Vital Signs: Temp: 98.1 F (36.7 C) (06/16 0438) Temp Source: Oral (06/16 0438) BP: 101/67 mmHg (06/16 0438) Pulse Rate: 109 (06/16 0438) Intake/Output from previous day: 06/15 0701 - 06/16 0700 In: 2241 [P.O.:960; I.V.:981; IV Piggyback:300] Out: 0938 [Urine:1825] Intake/Output from this shift: Total I/O In: 610 [P.O.:600; I.V.:10] Out: 475 [Urine:475]  Labs:  Recent Labs  06/29/14 1112 06/30/14 0445  WBC 7.5 6.9  HGB 8.5* 7.9*  PLT 60* 59*  CREATININE 1.74* 1.61*   Estimated Creatinine Clearance: 33.6 mL/min (by C-G formula based on Cr of 1.61).  Recent Labs  06/30/14 1545  [place] 16     Microbiology: Recent Results (from the past 720 hour(s))  Culture, blood (routine x 2) Call MD if unable to obtain prior to antibiotics being given     Status: None (Preliminary result)   Collection Time: 06/27/14  3:55 PM  Result Value Ref Range Status   Specimen Description BLOOD LEFT ARM  Final   Special Requests   Final    BOTTLES DRAWN AEROBIC AND ANAEROBIC [place] BOTH BOTTLES   Culture   Final           BLOOD CULTURE RECEIVED NO GROWTH TO DATE CULTURE WILL BE HELD FOR 5 DAYS BEFORE ISSUING A FINAL NEGATIVE REPORT Performed at [facility]    Report Status PENDING  Incomplete  Culture, blood (routine x 2) Call MD if unable to obtain prior to antibiotics being given     Status: None (Preliminary result)   Collection Time: 06/27/14  4:00 PM  Result Value Ref Range Status   Specimen Description BLOOD LEFT ARM  Final   Special Requests   Final    BOTTLES DRAWN AEROBIC AND ANAEROBIC  [place] BOTH BOTTLES   Culture   Final           BLOOD CULTURE RECEIVED NO  GROWTH TO DATE CULTURE WILL BE HELD FOR 5 DAYS BEFORE ISSUING A FINAL NEGATIVE REPORT Performed at [facility]    Report Status PENDING  Incomplete    Medical History: Past Medical History  Diagnosis Date  . COPD (chronic obstructive pulmonary disease)   . Allergy     allergic rhinitis  . Hx of radiation therapy 12/09/12-01/01/13    lung 37.5Gy  . nscl ca w/ brain mets dx'd 08/2012    Patient has a lung mass which is being evaluated    Assessment: 24 yoM presents with hypoxia, concerns of bronchitis on imaging.  PMH metastatic lung cancer; cavitary lesion noted on CXR.  Started on Levaquin for possible HCAP, but broadened to vanc/ceftazidime with clinical worsening.  Now to re-narrow to Levaquin.  6/10 >> Zosyn x 1  6/10 >> Doxycycline >> 6/12  6/12 >> Levaquin >> 6/12; restart 6/16 >> 6/12 >> Ceftaz >> 6/16 6/12 >> Vanc >> 6/16  6/12 blood: NGTD  Sputum: to be collected  Strep Ag: neg  Legionella Ag: neg  Afebrile WBC WNL Renal: AKI on CKD improving. Good UOP. Contrast given 6/10 for CTA   Goal of Therapy:  Doses adjusted per renal function Eradication of infection  Plan:  Day 7 abx  Levaquin 750 mg IV q48 hrs, to start 12  hrs after last ceftazidime dose  F/u cultures, renal function, clinical course  Reuel Boom, PharmD Pager: [phone] 07/01/2014, 11:30 AM

## 2014-07-01 NOTE — Consult Note (Signed)
Name: [name] MRN: [id number] DOB: 29-Sep-1933    ADMISSION DATE:  06/25/2014 CONSULTATION DATE: 6/16  REFERRING MD :  Charlies Silvers   CHIEF COMPLAINT:  Progressive dyspnea   BRIEF PATIENT DESCRIPTION: 81 year old white male w/ NSCLC w/ mets to brain. Not on chemo since March 2016 d/t poor tolerance and pancytopenia. Admitted on 6/10 w/ progressive weakness, hypoxia and dx'd w/ working dx of PNA. Developed progressive pulmonary infiltrates in spite of appropriate abx, PCCM asked to see on 6/16 as O2 requirements escalated to needing 100% NRB and pt still full code   SIGNIFICANT EVENTS    STUDIES:   6/11 echo: grade I diastolic HF. EF intact   HISTORY OF PRESENT ILLNESS:   81 year old male w/ stage IV Non-small cell lung cancer w/ mets to brain. Currently off chemotherapy since April 2016 due to pancytopenia and intolerance. On his last visit w/ Dr Inda Merlin he was noted to have stable disease in the chest,abd and pelvis. Currently under observation only. Admitted to medical service 6/10 w/ fatigue, weakness and falls. Dx eval on presentation showed him to have room air sats in 80s, bilateral LE swelling, and CXR w/ bilateral interstitial prominence. He was admitted w/ working dx of possible PNA. His hospital course has been complicated by on-going hypoxia and progression of what appeared to be pulmonary edema in spite of IV antibiotics. On 6/16 he had increased accessory muscle use, restlessness and O2 sats recorded in the 40s with activity. We were asked to see given his increased O2 requirements.    PAST MEDICAL HISTORY :   has a past medical history of COPD (chronic obstructive pulmonary disease); Allergy; radiation therapy (12/09/12-01/01/13); and nscl ca w/ brain mets (dx'd 08/2012).  has past surgical history that includes Cholecystectomy. Prior to Admission medications   Medication Sig Start Date End Date Taking? Authorizing Provider  acetaminophen (TYLENOL) 500 MG tablet Take 500 mg  by mouth every 6 (six) hours as needed for mild pain.    Yes Historical Provider, MD  clobetasol cream (TEMOVATE) 1.60 % Apply 1 application topically daily as needed (for itching).   Yes Historical Provider, MD  dexamethasone (DECADRON) 4 MG tablet Take 1 tablet (4 mg total) by mouth 2 (two) times daily. 06/21/14  Yes Tyler Pita, MD  feeding supplement, ENSURE COMPLETE, (ENSURE COMPLETE) LIQD Take 237 mLs by mouth 3 (three) times daily between meals. Patient taking differently: Take 237 mLs by mouth daily as needed (nutritional supplement).  03/11/14  Yes Maryann Mikhail, DO  folic acid (FOLVITE) 109 MCG tablet Take 400 mcg by mouth every morning.   Yes Historical Provider, MD  furosemide (LASIX) 20 MG tablet TAKE 1 TABLET BY MOUTH DAILY AS NEEDED FOR SWELLING OF THE LOWER EXTREMITIES. Patient taking differently: TAKE 20 MG BY MOUTH DAILY AS NEEDED FOR SWELLING OF THE LOWER EXTREMITIES. 05/09/14  Yes Curt Bears, MD  levETIRAcetam (KEPPRA) 500 MG tablet Take 1 tablet (500 mg total) by mouth 2 (two) times daily. 05/02/14  Yes Orson Eva, MD  Polyethyl Glycol-Propyl Glycol (SYSTANE OP) Place 1 drop into both eyes daily.   Yes Historical Provider, MD  simvastatin (ZOCOR) 10 MG tablet Take 10 mg by mouth at bedtime.   Yes Historical Provider, MD  levofloxacin (LEVAQUIN) 750 MG tablet Take 1 tablet (750 mg total) by mouth every other day. 06/29/14   Donne Hazel, MD   Allergies  Allergen Reactions  . Asa [Aspirin] Nausea Only  . Sulfa Antibiotics  Nausea And Vomiting    FAMILY HISTORY:  family history includes Cancer in his mother; Stroke in his father. SOCIAL HISTORY:  reports that he quit smoking about 15 years ago. He has never used smokeless tobacco. He reports that he does not drink alcohol or use illicit drugs.  REVIEW OF SYSTEMS:   Unable   SUBJECTIVE: confused   VITAL SIGNS: Temp:  [97.6 F (36.4 C)-100 F (37.8 C)] 100 F (37.8 C) (06/16 1448) Pulse Rate:  [109-135] 135  (06/16 1342) Resp:  [20-24] 24 (06/16 1342) BP: (101-107)/(53-68) 107/53 mmHg (06/16 1342) SpO2:  [77 %-91 %] 77 % (06/16 1342)  PHYSICAL EXAMINATION: General:  Frail 81 year old male, confused at times  Neuro:  Awake, alert, confused but cooperative.  HEENT:  Flushed. No JVD  Cardiovascular:  Tachy rrr  Lungs:  Diffuse rales. + accessory muscle use  Abdomen:  Soft, non-tender + bowel sounds  Musculoskeletal:  Intact  Skin:  Chronic venous stasis changes. LE edema    Recent Labs Lab 06/28/14 0506 06/29/14 1112 06/30/14 0445  NA 136 135 137  K 4.0 3.6 3.6  CL 103 101 104  CO2 '24 23 23  '$ BUN 42* 42* 42*  CREATININE 1.92* 1.74* 1.61*  GLUCOSE 131* 155* 146*    Recent Labs Lab 06/29/14 1112 06/30/14 0445 07/01/14 1241  HGB 8.5* 7.9* 7.4*  HCT 25.0* 23.4* 23.2*  WBC 7.5 6.9 8.3  PLT 60* 59* 47*   Dg Chest Port 1 View  07/01/2014   CLINICAL DATA:  Congestion congestion.  Short of breath.  EXAM: PORTABLE CHEST - 1 VIEW  COMPARISON:  06/30/2014.  FINDINGS: RIGHT IJ Port-A-Cath appears unchanged. Cardiopericardial silhouette is also unchanged, upper limits of normal for projection. There is diffuse interstitial and alveolar opacity, which appears unchanged compared to the most recent comparison radiographs. Cavitary RIGHT hilar lesion identified on prior CT is better seen than on yesterday's exam but remains subtle, measuring about 3 cm. Small RIGHT pleural effusion.  IMPRESSION: 1. Persistent had bilateral interstitial and airspace opacity, likely representing pulmonary edema and CHF. Multifocal pneumonia is considered less likely. 2. 3 cm RIGHT lower lobe cavitary lesion better seen on today's examination in the RIGHT infrahilar region. 3. Small RIGHT pleural effusion unchanged.   Electronically Signed   By: Dereck Ligas M.D.   On: 07/01/2014 10:40   Dg Chest Port 1 View  06/30/2014   CLINICAL DATA:  Healthcare associated pneumonia  EXAM: PORTABLE CHEST - 1 VIEW  COMPARISON:   June 29, 2014  FINDINGS: Port-A-Cath tip is in the superior vena cava near the cavoatrial junction, stable. No pneumothorax. There is widespread interstitial and patchy alveolar opacity, stable on the left with slight increase in airspace opacity in the right lower lobe. There is been some mild clearing of right mid lung atelectasis compared to 1 day prior. Heart is upper normal in size with pulmonary vascularity within normal limits. No adenopathy. No focal bone lesions identified.  IMPRESSION: Slight increase in airspace opacity in the right lower lobe. There is a combination of patchy interstitial and alveolar opacity bilaterally. Question a degree of underlying congestive heart failure. The areas of airspace opacity may represent superimposed pneumonia. Both edema and pneumonia may exist concurrently. No change in cardiac silhouette.   Electronically Signed   By: Lowella Grip III M.D.   On: 06/30/2014 07:18    ASSESSMENT / PLAN:  Acute hypoxic respiratory failure in setting of progressive pulmonary infiltrates. Superimposed on NSCLC and  failure to thrive.  Suspect that this is primarily non-cardiac pulmonary edema. Possibly d/t severe malnutrition and third spacing. Does have diastolic dysfxn so this could also be contributing to some extent. He has had what should be adequate antibiotic coverage so worsening infection seems unlikely unless he is aspirating. Finally doubt this is a drug induced pneumonitis. Had a excellent conversation with the patient and wife. From a critical care stand-point I have nothing to offer him that will make him better should he continue to deteriorate. He does not want to die but he also does not want to suffer so together we have made the decision that we will continue current medical care with the hope that he will get better, but should these current interventions fail and he were to arrest we will not provide ACLS, BIPAP or mechanical ventilation. His wife was very  grateful for this discussion.   Plan Agree w/ lasix Continue current abx Cont current O2 Full DNR NO BIPAP If he gets worse would transition to comfort.   Delirium  Not sure how much of this is hypoxia vs steroids vs brain mets.  Plan Supportive care  All other issues: CKD stage III, thrombocytopenia, severe protein malnutrition and anemia  Plan Per primary team   Erick Colace ACNP-BC [place] Pager # [phone] OR # [phone] if no answer 07/01/2014, 2:55 PM  Baltazar Apo, MD, PhD 07/02/2014, 9:55 AM [facility] [phone] or if no answer [phone]

## 2014-07-01 NOTE — Progress Notes (Signed)
O2 saturations very labile. Saturations drop to 60-70s with minor exertion even in bed with 6L [place]. During these times, patient becomes confused. Pt's saturations alwso dcrease with taking pills and talking. Saturations will remain low for up to 20 minutes and continued deep breathing, IS, and complete rest are the only actions that will increase saturations. Using the urinal causes the patient to become SOB. Will continue to monitor.

## 2014-07-01 NOTE — Consult Note (Signed)
Consultation Note Date: 07/01/2014   Patient Name: [name]  DOB: 12/21/1933  MRN: [id number]  Age / Sex: 81 y.o., male   PCP: [name], MD Referring Physician: Robbie Lis, MD  Reason for Consultation: Establishing goals of care  Palliative Care Assessment and Plan Summary of Established Goals of Care and Medical Treatment Preferences   Clinical Assessment/Narrative:  81 yo male with PMH metastatic non small cell lung cancer, s/p systemic chemotherapy, off maintenance chemotherapy since March 2016 for declining functional status, history of brain mets s/p XRT. Patient admitted since 06-25-14 for fatigue, weakness, falls. Being treated for possible HcAP, patient with worsening hypoxia, increasing O2 requirements. Pulm/CCM also consulted, palliative consulted for goals of care discussions.   PLAN: Dr. Troy Sine note reviewed and appreciated. Patient and his wife are now at peace with their decision to elect DNR DNI status. No additional limits on care desired. Continue current treatments.   Add low dose PRN Xanax Po for anxiety. Discussed with patient and wife.   Will continue to follow and help guide decision making. Patient will greatly benefit, in my opinion, with addition of Hospice services, will discuss with patient/ family within the next 24-48 hours. Continue efforts at symptom management and trust building with the patient and the family as a unit.   Contacts/Participants in Discussion: Primary Decision Maker: patient who is decisional, then his wife [name] at [phone]   HCPOA: yes   patient elects his wife to help guide his decision making, unknown status of actual document.   Code Status/Advance Care Planning:  DNR DNI  Continue current mode of care for now.   Symptom Management:   Anxiety/ air hunger: Add low dose Xanax PO PRN. Discussed addition of opioids, patient and wife decided to hold off on PO Morphine for now.   Constipation: will expand  bowel regimen.   Palliative Prophylaxis: bowel and anti emetic regimen.   Additional Recommendations (Limitations, Scope, Preferences):  Continue supportive care, follow patient's O2 requirements, engage in hospice discussions.    Psycho-social/Spiritual:   Support System: wife, child  Desire for further Chaplaincy support:not expressed in current encounter, will explore in further conversations with patient.   Prognosis: < 6 months  Discharge Planning:   pending clinical course.    Domains of Care: - Physical:  Mild air hunger, dyspnea, no pain - Psychological: anxiety air hunger - Social: married, lives with wife in [place], [place]. Has son daughter in law and grand child also for support.  - Spiritual: no acute issues noted in initial encounter, will continue to explore and address, will consult spiritual care if deemed appropriate.  - Cultural: wife has caregiver burn out, she admits to being stressed out about the patient's declining condition.  - Imminently dying: no - Ethical/Legal: code status now established as DNR DNI.   Values: patient wishes to be as well as he can, for as long as he can. "You'll are preparing for pushing me on out, aren't you?" Discussed scope of comfort care management, all questions answered to the best of my ability.   Life limiting illness: Acute hypoxic respiratory failure, non cardiac pulmonary edema in the setting of lung cancer.       Chief Complaint/History of Present Illness: fatigue, weakness.   Primary Diagnoses  Present on Admission:  . SOB (shortness of breath) . Primary cancer of right lower lobe of lung . Other pancytopenia . COPD (chronic obstructive pulmonary disease) . CKD (chronic kidney disease) .  Protein-calorie malnutrition, severe . Peripheral edema . Symptomatic anemia  Palliative Review of Systems:  anxiety Dyspnea Constipation  I have reviewed the medical record, interviewed the patient and family, and examined  the patient. The following aspects are pertinent.  Past Medical History  Diagnosis Date  . COPD (chronic obstructive pulmonary disease)   . Allergy     allergic rhinitis  . Hx of radiation therapy 12/09/12-01/01/13    lung 37.5Gy  . nscl ca w/ brain mets dx'd 08/2012    Patient has a lung mass which is being evaluated   History   Social History  . Marital Status: Married    Spouse Name: N/A  . Number of Children: N/A  . Years of Education: N/A   Social History Main Topics  . Smoking status: Former Smoker    Quit date: 09/11/1998  . Smokeless tobacco: Never Used  . Alcohol Use: No  . Drug Use: No  . Sexual Activity: Not on file   Other Topics Concern  . None   Social History Narrative   Family History  Problem Relation Age of Onset  . Cancer Mother     breast  . Stroke Father    Scheduled Meds: . dexamethasone  4 mg Oral BID  . docusate sodium  100 mg Oral BID  . feeding supplement (ENSURE ENLIVE)  237 mL Oral BID BM  . guaiFENesin  600 mg Oral BID  . levETIRAcetam  500 mg Oral BID  . levofloxacin (LEVAQUIN) IV  750 mg Intravenous Q48H  . simvastatin  10 mg Oral QHS  . sodium chloride  3 mL Intravenous Q12H   Continuous Infusions: . sodium chloride 10 mL/hr (06/30/14 1724)   PRN Meds:.acetaminophen **OR** acetaminophen, albuterol, ALPRAZolam, HYDROcodone-acetaminophen, ipratropium, ondansetron **OR** ondansetron (ZOFRAN) IV, polyethylene glycol, sodium chloride Medications Prior to Admission:  Prior to Admission medications   Medication Sig Start Date End Date Taking? Authorizing Provider  acetaminophen (TYLENOL) 500 MG tablet Take 500 mg by mouth every 6 (six) hours as needed for mild pain.    Yes Historical Provider, MD  clobetasol cream (TEMOVATE) 3.15 % Apply 1 application topically daily as needed (for itching).   Yes Historical Provider, MD  dexamethasone (DECADRON) 4 MG tablet Take 1 tablet (4 mg total) by mouth 2 (two) times daily. 06/21/14  Yes Tyler Pita, MD  feeding supplement, ENSURE COMPLETE, (ENSURE COMPLETE) LIQD Take 237 mLs by mouth 3 (three) times daily between meals. Patient taking differently: Take 237 mLs by mouth daily as needed (nutritional supplement).  03/11/14  Yes Maryann Mikhail, DO  folic acid (FOLVITE) 400 MCG tablet Take 400 mcg by mouth every morning.   Yes Historical Provider, MD  furosemide (LASIX) 20 MG tablet TAKE 1 TABLET BY MOUTH DAILY AS NEEDED FOR SWELLING OF THE LOWER EXTREMITIES. Patient taking differently: TAKE 20 MG BY MOUTH DAILY AS NEEDED FOR SWELLING OF THE LOWER EXTREMITIES. 05/09/14  Yes Curt Bears, MD  levETIRAcetam (KEPPRA) 500 MG tablet Take 1 tablet (500 mg total) by mouth 2 (two) times daily. 05/02/14  Yes Orson Eva, MD  Polyethyl Glycol-Propyl Glycol (SYSTANE OP) Place 1 drop into both eyes daily.   Yes Historical Provider, MD  simvastatin (ZOCOR) 10 MG tablet Take 10 mg by mouth at bedtime.   Yes Historical Provider, MD  levofloxacin (LEVAQUIN) 750 MG tablet Take 1 tablet (750 mg total) by mouth every other day. 06/29/14   Donne Hazel, MD   Allergies  Allergen Reactions  . Diona Fanti [  Aspirin] Nausea Only  . Sulfa Antibiotics Nausea And Vomiting   CBC:    Component Value Date/Time   WBC 8.3 07/01/2014 1241   WBC 7.6 06/17/2014 1111   WBC 7.6 10/13/2012 1206   HGB 7.4* 07/01/2014 1241   HGB 8.1* 06/17/2014 1111   HGB 13.7* 10/13/2012 1206   HCT 23.2* 07/01/2014 1241   HCT 24.3* 06/17/2014 1111   HCT 41.5* 10/13/2012 1206   PLT 47* 07/01/2014 1241   PLT 49* 06/17/2014 1111   MCV 91.3 07/01/2014 1241   MCV 92.0 06/17/2014 1111   MCV 89.4 10/13/2012 1206   NEUTROABS 5.7 06/25/2014 1636   NEUTROABS 6.9* 06/17/2014 1111   LYMPHSABS 0.2* 06/25/2014 1636   LYMPHSABS 0.3* 06/17/2014 1111   MONOABS 0.2 06/25/2014 1636   MONOABS 0.3 06/17/2014 1111   EOSABS 0.0 06/25/2014 1636   EOSABS 0.0 06/17/2014 1111   BASOSABS 0.0 06/25/2014 1636   BASOSABS 0.0 06/17/2014 1111   Comprehensive  Metabolic Panel:    Component Value Date/Time   NA 137 06/30/2014 0445   NA 138 06/17/2014 1111   NA CANCELED 10/13/2012 1149   K 3.6 06/30/2014 0445   K 4.3 06/17/2014 1111   CL 104 06/30/2014 0445   CO2 23 06/30/2014 0445   CO2 25 06/17/2014 1111   BUN 42* 06/30/2014 0445   BUN 35.9* 06/17/2014 1111   BUN CANCELED 10/13/2012 1149   CREATININE 1.61* 06/30/2014 0445   CREATININE 2.0* 06/17/2014 1111   GLUCOSE 146* 06/30/2014 0445   GLUCOSE 101 06/17/2014 1111   GLUCOSE CANCELED 10/13/2012 1149   CALCIUM 8.5* 06/30/2014 0445   CALCIUM 8.3* 06/17/2014 1111   AST 25 06/26/2014 0300   AST 22 06/17/2014 1111   ALT 17 06/26/2014 0300   ALT 22 06/17/2014 1111   ALKPHOS 105 06/26/2014 0300   ALKPHOS 140 06/17/2014 1111   BILITOT 1.5* 06/26/2014 0300   BILITOT 0.68 06/17/2014 1111   PROT 5.5* 06/26/2014 0300   PROT 5.8* 06/17/2014 1111   PROT CANCELED 10/13/2012 1149   ALBUMIN 2.2* 06/26/2014 0300   ALBUMIN 2.5* 06/17/2014 1111    Physical Exam: Vital Signs: BP 107/53 mmHg  Pulse 135  Temp(Src) 101.1 F (38.4 C) (Oral)  Resp 24  Ht '5\' 11"'$  (1.803 m)  Wt 62.8 kg (138 lb 7.2 oz)  BMI 19.32 kg/m2  SpO2 77% SpO2: SpO2: (!) 77 % O2 Device: O2 Device: Venturi Mask O2 Flow Rate: O2 Flow Rate (L/min): 6 L/min Intake/output summary:  Intake/Output Summary (Last 24 hours) at 07/01/14 1654 Last data filed at 07/01/14 1630  Gross per 24 hour  Intake   2131 ml  Output   2175 ml  Net    -44 ml   LBM: Last BM Date: 06/30/14 Baseline Weight: Weight: 67.3 kg (148 lb 5.9 oz) (wearing street clothes) Most recent weight: Weight: 62.8 kg (138 lb 7.2 oz)  Exam Findings:   on high O2 requirements, mild distress, mild air hunger evident.  Coarse rales bilaterally S1 S2 Soft non tender Trace edema Awake alert, answers few questions appropriately.          Palliative Performance Scale:30%              Additional Data Reviewed: Recent Labs     06/29/14  1112  06/30/14  0445   07/01/14  1241  WBC  7.5  6.9  8.3  HGB  8.5*  7.9*  7.4*  PLT  60*  59*  47*  NA  135  137   --   BUN  42*  42*   --   CREATININE  1.74*  1.61*   --      Time In: 1620 Time Out: 1710 Time Total: 50 minutes.  Greater than 50%  of this time was spent counseling and coordinating care related to the above assessment and plan.  Signed by: Loistine Chance, MD  Loistine Chance, MD  07/01/2014, 4:54 PM  Please contact Palliative Medicine Team phone at [phone] for questions and concerns.

## 2014-07-01 NOTE — Progress Notes (Signed)
PT Cancellation Note  Patient Details Name: [name] MRN: [id number] DOB: [birth year]   Cancelled Treatment:    Reason Eval/Treat Not Completed: Medical issues which prohibited therapy Pt with HR 144 bpm at rest and currently on NRB.  RN reports saturations still drop into 40's with any activity.  Will hold PT today.  Also noted likely palliative consult so will check back next week on pt progress, goals of care.   Doloros Kwolek,KATHrine E 07/01/2014, 2:10 PM  Carmelia Bake, PT, DPT 07/01/2014 Pager: [phone]

## 2014-07-01 NOTE — Progress Notes (Signed)
Patient ID: [name], male   DOB: [birth year], 81 y.o.   MRN: [id number] [facility] PROGRESS NOTE  [name] EML:[id number] DOB: May 15, 1933 DOA: 06/25/2014 PCP: Preston Gainer, MD  Brief narrative:    81 year old male with past medical history of metastatic non small cell lung cancer (follows with Dr. Julien Garcia on oncology), brain mets status post stereotactic radiotherapy to 3 brian lesions completed in 11/2012 under Dr. Ida Garcia care, palliative radiotherapy to left lower lung mass completed 12/2012, status post systemic chemotherapy and now on maintenance chemotherapy. He presented to ED with hypoxia and was found to have bilateral pneumonia.   His hospital course is complicated with ongoing hypoxia, high oxygen requirements with ambulation and even at rest. His chest x-ray from today shows persistent bilateral interstitial and airspace opacity probably pulmonary edema and CHF and multifocal pneumonia less likely. We will change vancomycin and Fortaz to Levaquin today 2014/07/26. Palliative care was also consulted for overall goals of care.  Barrier to discharge: still hypoxic at rest and with ambulation. His chest x-ray from today points more towards pulmonary edema, CHF but he has a soft blood pressure for which reason we can give IV Lasix. IV fluids were stopped. We also consulted palliative care for overall goals of care.   Assessment/Plan:    Principal Problem: Acute respiratory failure with hypoxia / multifocal pneumonia / healthcare associated pneumonia / pulmonary edema - Ongoing hypoxia likely due to combination of metastatic lung cancer, pneumonia and now what seems to be pulmonary edema based on chest x-ray 07-26-14. - Soft blood pressure precludes use of Lasix. We stopped all IV fluids on 06/30/2014. - Strep pneumonia, Legionella, blood cultures are all negative so far. - This no fevers and white blood cell count is within normal limits we will stop vancomycin and  Fortaz today and start Levaquin. - Continue oxygen support via [place] to keep O2 saturation above 90%  Active Problem: Metastatic non small cell lung cancer with brain mets - Status post stereotactic radiotherapy to 3 brian lesions completed in 11/2012 under Dr. Ida Garcia care - Status post palliative radiotherapy to left lower lung mass completed 12/2012 - Status post systemic chemotherapy and now on maintenance chemotherapy, last infusion in 03/2014. Not given in 04/2014 due to intolerance and low cell counts.  Anemia of chronic disease - Secondary to history of malignancy. - Repeat CBC today. Hemoglobin was 7.9 yesterday.  Chronic kidney disease, stage 3 - Creatinine at baseline 2.0 (06/17/14) - Currently 1.61  Thrombocytopenia - Secondary to history of malignancy. No reports of bleeding. - Check CBC today.  Brain metastases - Continue Keppra. No reports of seizures.   Severe protein calorie malnutrition - In the context of chronic illness  - Nutrition consulted - Continue nutritional supplementation   Dyslipidemia - Continue statin therapy    DVT Prophylaxis  - SCD's bilaterally while patient in hospital.   Code Status: Full.  Family Communication:  plan of care discussed with the patient Disposition Plan: to SNF likely in next few days if his hypoxia improves and if his respiratory status improves.  IV access:  Peripheral IV  Procedures and diagnostic studies:    Dg Chest Port 1 View 26-Jul-2014   1. Persistent had bilateral interstitial and airspace opacity, likely representing pulmonary edema and CHF. Multifocal pneumonia is considered less likely. 2. 3 cm RIGHT lower lobe cavitary lesion better seen on today's examination in the RIGHT infrahilar region. 3. Small RIGHT pleural effusion unchanged.  Ct Angio Chest Pe W/cm &/or Wo Cm 06/26/2014 1. No evidence of pulmonary embolus. 2. Diffuse hazy airspace opacification noted involving much of the left lower lobe and  lingula, and portions of the right middle and lower lobes, concerning for multifocal pneumonia. 3. Underlying cavitary mass at the right infrahilar region appears mildly increased in size, measuring 4.4 x 3.1 x 3.5 cm. Additional smaller nodule at the right lower lobe is also slightly more prominent, measuring 1.9 cm. This is concerning for mild interval progression of lung cancer. 4. Mildly increased small right pleural effusion noted. 5. Underlying bilateral emphysematous change seen. 6. Mildly prominent subcarinal node, measuring 1.1 cm in short axis, more prominent than on the prior study.     Dg Chest Port 1 View 06/30/2014 Slight increase in airspace opacity in the right lower lobe. There is a combination of patchy interstitial and alveolar opacity bilaterally. Question a degree of underlying congestive heart failure. The areas of airspace opacity may represent superimposed pneumonia. Both edema and pneumonia may exist concurrently. No change in cardiac silhouette.     Dg Chest Port 1 View 06/29/2014  Mild worsening of diffuse bilateral pulmonary airspace disease, which may be due to diffuse pulmonary edema or infection.  No significant change in right hilar mass and small right pleural effusion.     Medical Consultants:   Other Consultants:  Nutrition Physical therapy   IAnti-Infectives:   Doxycycline 6/10 -->6/12  Vancomycin 6/12 --> 6/16 Fortaz 6/12 --> 6/16 Levaquin 6/16 -->    Preston Humber, MD  [facility] Pager [phone]  Time spent in minutes: 25 minutes  If 7PM-7AM, please contact night-coverage [email or web address] Password [email or web address] 07/01/2014, 11:28 AM   LOS: 6 days    HPI/Subjective: No acute overnight events. Patient reports short of breath while moving from bed to chair.   Objective: Filed Vitals:   06/30/14 0859 06/30/14 1332 06/30/14 2104 07/01/14 0438  BP:  118/75 104/68 101/67  Pulse:  125 120 109  Temp:  97.9 F (36.6 C) 97.9 F (36.6 C) 98.1 F (36.7 C)   TempSrc:  Oral Oral Oral  Resp:  '20 22 20  '$ Height:      Weight:      SpO2: 67% 92% 89% 91%    Intake/Output Summary (Last 24 hours) at 07/01/14 1128 Last data filed at 07/01/14 1040  Gross per 24 hour  Intake   2131 ml  Output   1925 ml  Net    206 ml    Exam:   General:  Pt is alert, follows commands appropriately, not in acute distress  Cardiovascular: Regular rate and rhythm, S1/S2 appreciated   Respiratory: diminished breath sounds bilaterally, no wheezing   Abdomen: Soft, non tender, non distended, bowel sounds present  Extremities: No cyanosis, pulses DP and PT palpable bilaterally  Neuro: Grossly nonfocal  Data Reviewed: Basic Metabolic Panel:  Recent Labs Lab 06/26/14 0300 06/27/14 0452 06/28/14 0506 06/29/14 1112 06/30/14 0445  NA 134* 135 136 135 137  K 3.9 3.9 4.0 3.6 3.6  CL 101 102 103 101 104  CO2 '25 25 24 23 23  '$ GLUCOSE 119* 125* 131* 155* 146*  BUN 34* 35* 42* 42* 42*  CREATININE 1.66* 1.55* 1.92* 1.74* 1.61*  CALCIUM 8.2* 8.4* 8.5* 8.6* 8.5*  MG 1.5*  --   --   --   --   PHOS 3.6  --   --   --   --    Liver Function Tests:  Recent  Labs Lab 06/25/14 1636 06/26/14 0300  AST 29 25  ALT 20 17  ALKPHOS 117 105  BILITOT 0.7 1.5*  PROT 5.8* 5.5*  ALBUMIN 2.5* 2.2*   No results for input(s): LIPASE, AMYLASE in the last 168 hours. No results for input(s): AMMONIA in the last 168 hours. CBC:  Recent Labs Lab 06/25/14 1636 06/26/14 0300 06/27/14 0452 06/28/14 0506 06/29/14 1112 06/30/14 0445  WBC 6.1 5.4 6.2 6.7 7.5 6.9  NEUTROABS 5.7  --   --   --   --   --   HGB 7.2* 8.3* 8.7* 8.0* 8.5* 7.9*  HCT 20.7* 23.7* 25.5* 23.8* 25.0* 23.4*  MCV 90.8 91.9 92.4 91.9 90.9 91.4  PLT 68* 58* 57* 58* 60* 59*   Cardiac Enzymes:  Recent Labs Lab 06/25/14 1636 06/25/14 2150 06/26/14 0300 06/26/14 0915  TROPONINI 0.04* 0.04* 0.04* 0.04*   BNP: Invalid input(s): POCBNP CBG: No results for input(s): GLUCAP in the last 168  hours.  Culture, blood (routine x 2) Call MD if unable to obtain prior to antibiotics being given     Status: None (Preliminary result)   Collection Time: 06/27/14  3:55 PM  Result Value Ref Range Status   Specimen Description BLOOD LEFT ARM  Final   Special Requests   Final    BOTTLES DRAWN AEROBIC AND ANAEROBIC [place]   Culture   Final           BLOOD CULTURE RECEIVED NO GROWTH TO DATE CULTURE WILL BE HELD FOR 5 DAYS BEFORE ISSUING A FINAL NEGATIVE REPORT Performed at [facility]    Report Status PENDING  Incomplete  Culture, blood (routine x 2) Call MD if unable to obtain prior to antibiotics being given     Status: None (Preliminary result)   Collection Time: 06/27/14  4:00 PM  Result Value Ref Range Status   Specimen Description BLOOD LEFT ARM  Final   Special Requests   Final    BOTTLES DRAWN AEROBIC AND ANAEROBIC  [place] BOTH BOTTLES   Culture   Final           BLOOD CULTURE RECEIVED NO GROWTH TO DATE CULTURE WILL BE HELD FOR 5 DAYS BEFORE ISSUING A FINAL NEGATIVE REPORT Performed at [facility]    Report Status PENDING  Incomplete     Scheduled Meds: . cefTAZidime (FORTAZ)  2 g Intravenous Q12H  . dexamethasone  4 mg Oral BID  . docusate sodium  100 mg Oral BID  . feeding supplement (ENSURE ENLIVE)  237 mL Oral BID BM  . guaiFENesin  600 mg Oral BID  . levETIRAcetam  500 mg Oral BID  . simvastatin  10 mg Oral QHS  . vancomycin  1,000 mg Intravenous Q24H   Continuous Infusions: . sodium chloride 10 mL/hr (06/30/14 1724)

## 2014-07-02 DIAGNOSIS — D696 Thrombocytopenia, unspecified: Secondary | ICD-10-CM | POA: Diagnosis present

## 2014-07-02 DIAGNOSIS — J189 Pneumonia, unspecified organism: Secondary | ICD-10-CM | POA: Diagnosis present

## 2014-07-02 DIAGNOSIS — J9601 Acute respiratory failure with hypoxia: Secondary | ICD-10-CM | POA: Diagnosis present

## 2014-07-02 DIAGNOSIS — R0902 Hypoxemia: Secondary | ICD-10-CM

## 2014-07-02 DIAGNOSIS — R0602 Shortness of breath: Secondary | ICD-10-CM

## 2014-07-02 DIAGNOSIS — C799 Secondary malignant neoplasm of unspecified site: Secondary | ICD-10-CM

## 2014-07-02 DIAGNOSIS — D61818 Other pancytopenia: Secondary | ICD-10-CM

## 2014-07-02 DIAGNOSIS — C3490 Malignant neoplasm of unspecified part of unspecified bronchus or lung: Secondary | ICD-10-CM

## 2014-07-02 DIAGNOSIS — N183 Chronic kidney disease, stage 3 unspecified: Secondary | ICD-10-CM | POA: Diagnosis present

## 2014-07-02 DIAGNOSIS — D638 Anemia in other chronic diseases classified elsewhere: Secondary | ICD-10-CM | POA: Diagnosis present

## 2014-07-02 DIAGNOSIS — E785 Hyperlipidemia, unspecified: Secondary | ICD-10-CM | POA: Diagnosis present

## 2014-07-02 LAB — CBC
HCT: 23.8 % — ABNORMAL LOW (ref 39.0–52.0)
HEMOGLOBIN: 8.2 g/dL — AB (ref 13.0–17.0)
MCH: 32 pg (ref 26.0–34.0)
MCHC: 34.5 g/dL (ref 30.0–36.0)
MCV: 93 fL (ref 78.0–100.0)
PLATELETS: 57 10*3/uL — AB (ref 150–400)
RBC: 2.56 MIL/uL — ABNORMAL LOW (ref 4.22–5.81)
RDW: 21 % — ABNORMAL HIGH (ref 11.5–15.5)
WBC: 7.4 10*3/uL (ref 4.0–10.5)

## 2014-07-02 LAB — BASIC METABOLIC PANEL
Anion gap: 9 (ref 5–15)
BUN: 44 mg/dL — ABNORMAL HIGH (ref 6–20)
CO2: 25 mmol/L (ref 22–32)
Calcium: 8.6 mg/dL — ABNORMAL LOW (ref 8.9–10.3)
Chloride: 101 mmol/L (ref 101–111)
Creatinine, Ser: 1.7 mg/dL — ABNORMAL HIGH (ref 0.61–1.24)
GFR, EST AFRICAN AMERICAN: 43 mL/min — AB (ref >60)
GFR, EST NON AFRICAN AMERICAN: 37 mL/min — AB (ref >60)
GLUCOSE: 117 mg/dL — AB (ref 65–99)
POTASSIUM: 3.8 mmol/L (ref 3.5–5.1)
SODIUM: 135 mmol/L (ref 135–145)

## 2014-07-02 MED ORDER — SENNOSIDES-DOCUSATE SODIUM 8.6-50 MG PO TABS
1.0000 | ORAL_TABLET | Freq: Two times a day (BID) | ORAL | Status: DC
  Administered 2014-07-02 – 2014-07-06 (×8): 1 via ORAL
  Filled 2014-07-02 (×9): qty 1

## 2014-07-02 MED ORDER — ALUM & MAG HYDROXIDE-SIMETH 200-200-20 MG/5ML PO SUSP: 30.0000 mL | ORAL | Status: DC | PRN

## 2014-07-02 MED ORDER — ALPRAZOLAM 0.5 MG PO TABS: 0.5000 mg | ORAL_TABLET | Freq: Three times a day (TID) | ORAL | Status: DC | PRN

## 2014-07-02 NOTE — Progress Notes (Signed)
Daily Progress Note   Patient Name: [name]       Date: 07/02/2014 DOB: 1933/10/11  Age: 81 y.o. MRN#: [id number] Attending Physician: Robbie Lis, MD Primary Care Physician: [name], MD Admit Date: 06/25/2014  Reason for Consultation/Follow-up: Establishing goals of care  Subjective:  patient resting in bed, complains of pain in upper abdominal area, no bowel movement yet Slightly increased work of breathing at times, mostly related to anxiety air hunger Son is at the bedside.  Interval Events: Now in 1234 Did not rest well overnight  Length of Stay: 7 days  Current Medications: Scheduled Meds:  . dexamethasone  4 mg Oral BID  . feeding supplement (ENSURE ENLIVE)  237 mL Oral BID BM  . guaiFENesin  600 mg Oral BID  . levETIRAcetam  500 mg Oral BID  . levofloxacin (LEVAQUIN) IV  750 mg Intravenous Q48H  . senna-docusate  1 tablet Oral BID  . simvastatin  10 mg Oral QHS  . sodium chloride  3 mL Intravenous Q12H    Continuous Infusions: . sodium chloride 10 mL/hr (06/30/14 1724)    PRN Meds: acetaminophen **OR** acetaminophen, albuterol, ALPRAZolam, alum & mag hydroxide-simeth, HYDROcodone-acetaminophen, ipratropium, ondansetron **OR** ondansetron (ZOFRAN) IV, polyethylene glycol, sodium chloride  Palliative Performance Scale: 30%     Vital Signs: BP 123/73 mmHg  Pulse 105  Temp(Src) 97.7 F (36.5 C) (Oral)  Resp 21  Ht '5\' 11"'$  (1.803 m)  Wt 66.9 kg (147 lb 7.8 oz)  BMI 20.58 kg/m2  SpO2 100% SpO2: SpO2: 100 % O2 Device: O2 Device: NRB O2 Flow Rate: O2 Flow Rate (L/min): 15 L/min  Intake/output summary:  Intake/Output Summary (Last 24 hours) at 07/02/14 1528 Last data filed at 07/02/14 1223  Gross per 24 hour  Intake    830 ml  Output   2310 ml  Net  -1480 ml   LBM:   Baseline Weight: Weight: 67.3 kg (148 lb 5.9 oz) (wearing street clothes) Most recent weight: Weight: 66.9 kg (147 lb 7.8 oz)  Physical Exam:  General: elderly gentleman,  on ventimask, confused at times.  Scattered rhonchi S1 S2 Abdomen soft non tender No edema               Additional Data Reviewed: Recent Labs     06/30/14  0445  07/01/14  1241  07/02/14  0530  WBC  6.9  8.3  7.4  HGB  7.9*  7.4*  8.2*  PLT  59*  47*  57*  NA  137   --   135  BUN  42*   --   44*  CREATININE  1.61*   --   1.70*     Problem List:  Patient Active Problem List   Diagnosis Date Noted  . HCAP (healthcare-associated pneumonia) 07/02/2014  . Acute respiratory failure with hypoxia 07/02/2014  . CKD (chronic kidney disease) stage 3, GFR 30-59 ml/min 07/02/2014  . Thrombocytopenia 07/02/2014  . Dyslipidemia 07/02/2014  . Anemia of chronic disease 07/02/2014  . Lung cancer, primary, with metastasis from lung to other site   . Encounter for palliative care   . COPD exacerbation   . Convulsions/seizures 04/23/2014  . Brain metastasis 12/02/2012     Palliative Care Assessment & Plan    Code Status:  DNR  Goals of Care:   time trial of another 24-48 hours of current therapies.   Desire for further Chaplaincy support:no  3. Symptom Management:   indigestion: try Maalox  Constipation: add senokot  Anxiety, air hunger: increase Xanax    4. Palliative Prophylaxis:  Stool Softener: yes.   5. Prognosis: likely weeks to months.   5. Discharge Planning: will likely need inpatient hospice. Briefly discussed with son present in the room who voices preference [facility]. Will discuss with social work when appropriate to initiate hospice referral.    Care plan was discussed with pulm/CCM team, patient, son  Thank you for allowing the Palliative Medicine Team to assist in the care of this patient.   Time In: 1000 Time Out: 1025 Total Time 25 Prolonged Time Billed  no     Greater than 50%  of this time was spent counseling and coordinating care related to the above assessment and plan.   Loistine Chance, MD  07/02/2014, 3:28 PM   Please contact Palliative Medicine Team phone at [phone] for questions and concerns.

## 2014-07-02 NOTE — Progress Notes (Signed)
Name: FINES KIMBERLIN MRN: [id number] DOB: May 05, 1933    ADMISSION DATE:  06/25/2014 CONSULTATION DATE: 6/16  REFERRING MD :  Charlies Silvers   CHIEF COMPLAINT:  Progressive dyspnea   BRIEF PATIENT DESCRIPTION: 81 year old white male w/ NSCLC w/ mets to brain. Not on chemo since March 2016 d/t poor tolerance and pancytopenia. Admitted on 6/10 w/ progressive weakness, hypoxia and dx'd w/ working dx of PNA. Developed progressive pulmonary infiltrates in spite of appropriate abx, PCCM asked to see on 6/16 as O2 requirements escalated to needing 100% NRB and pt still full code   SIGNIFICANT EVENTS    STUDIES:   6/11 echo: grade I diastolic HF. EF intact   SUBJECTIVE: confused  VITAL SIGNS: Temp:  [97.6 F (36.4 C)-101.1 F (38.4 C)] 98.2 F (36.8 C) (06/17 0400) Pulse Rate:  [102-137] 116 (06/17 0800) Resp:  [17-30] 30 (06/17 0800) BP: (100-121)/(53-86) 121/86 mmHg (06/17 0800) SpO2:  [77 %-100 %] 100 % (06/17 0800) Weight:  [66.9 kg (147 lb 7.8 oz)] 66.9 kg (147 lb 7.8 oz) (06/16 1715)  PHYSICAL EXAMINATION: General:  Frail 81 year old male, confused at times but looks more comfortable today  Neuro:  Awake, alert, confused but cooperative.  HEENT:  Flushed. No JVD  Cardiovascular:  Tachy rrr  Lungs:  Diffuse rales. WOB improved.  Abdomen:  Soft, non-tender + bowel sounds  Musculoskeletal:  Intact  Skin:  Chronic venous stasis changes. LE edema    Recent Labs Lab 06/29/14 1112 06/30/14 0445 07/02/14 0530  NA 135 137 135  K 3.6 3.6 3.8  CL 101 104 101  CO2 '23 23 25  '$ BUN 42* 42* 44*  CREATININE 1.74* 1.61* 1.70*  GLUCOSE 155* 146* 117*    Recent Labs Lab 06/30/14 0445 07/01/14 1241 07/02/14 0530  HGB 7.9* 7.4* 8.2*  HCT 23.4* 23.2* 23.8*  WBC 6.9 8.3 7.4  PLT 59* 47* 57*   Dg Chest Port 1 View  07/01/2014   CLINICAL DATA:  Congestion congestion.  Short of breath.  EXAM: PORTABLE CHEST - 1 VIEW  COMPARISON:  06/30/2014.  FINDINGS: RIGHT IJ Port-A-Cath appears  unchanged. Cardiopericardial silhouette is also unchanged, upper limits of normal for projection. There is diffuse interstitial and alveolar opacity, which appears unchanged compared to the most recent comparison radiographs. Cavitary RIGHT hilar lesion identified on prior CT is better seen than on yesterday's exam but remains subtle, measuring about 3 cm. Small RIGHT pleural effusion.  IMPRESSION: 1. Persistent had bilateral interstitial and airspace opacity, likely representing pulmonary edema and CHF. Multifocal pneumonia is considered less likely. 2. 3 cm RIGHT lower lobe cavitary lesion better seen on today's examination in the RIGHT infrahilar region. 3. Small RIGHT pleural effusion unchanged.   Electronically Signed   By: Dereck Ligas M.D.   On: 07/01/2014 10:40    ASSESSMENT / PLAN:  Acute hypoxic respiratory failure in setting of progressive pulmonary infiltrates. Superimposed on NSCLC and failure to thrive.  See note from 6/16 re: goals of care. Suspect primarily non-cardiogenic pulmonary edema in setting of low oncotic pressures. Looks a little better after lasix.  Plan Wean O2 Continue current abx Full DNR NO BIPAP If he gets worse would transition to comfort.  Rest per Palliative and IM service  We will sign off   Erick Colace ACNP-BC [place] Pager # [phone] OR # [phone] if no answer 07/02/2014, 9:36 AM   Attending Note:  I have examined patient, reviewed labs, studies and notes.  I have discussed the case with Jerrye Bushy, and I agree with the data and plans as amended above. Pt has [place] that has been progressive since the Spring, has been unable to tolerate maintenance therapy, has been experiencing overall failure to thrive, now with progressive B infiltrates and acute respiratory failure. On exam today he is in less distress, looks slightly stronger. The focus of our care has been to define goals of care both in setting acute decompensation and  for his long term care. We will defer BiPAP, invasive interventions, continue abx and diuresis as he is able to tolerate. We have changed code status to DNR which I believe reflects his wishes in context of the severity of his disease.  Independent critical care time is 50 minutes.   Baltazar Apo, MD, PhD 07/02/2014, 9:55 AM [facility] [phone] or if no answer [phone]

## 2014-07-02 NOTE — Progress Notes (Signed)
Patient ID: ASTER ECKRICH, male   DOB: 03-06-1933, 81 y.o.   MRN: [id number] [facility] PROGRESS NOTE  JOSIA CUEVA SJG:[id number] DOB: January 17, 1933 DOA: 06/25/2014 PCP: [name], MD  Brief narrative:    81 year old male with past medical history of metastatic non small cell lung cancer (follows with Dr. Julien Nordmann on oncology), brain mets status post stereotactic radiotherapy to 3 brian lesions completed in 11/2012 under Dr. Ida Rogue care, palliative radiotherapy to left lower lung mass completed 12/2012, status post systemic chemotherapy and now on maintenance chemotherapy. He presented to ED with hypoxia and was found to have bilateral pneumonia.   His hospital course is complicated with ongoing hypoxia, high oxygen requirements. He was subsequently transferred to stepdown unit on 07/01/2014. Appreciate very much pulmonary having discussion with the patient and the family about the code status. Patient did not want mechanical ventilation or CPR. He stayed in SDU due to his requirement for ventimask. Per pulmonary, if respiratory status worsens they recommended transitioning patient to comfort.  Barrier to discharge: Ongoing hypoxia with nasal cannula oxygen support. He is currently on maximum oxygen flow with Ventimask. Palliative care was consulted for goals of care. We will monitor in step down unit at least for next 24 hours to see if hypoxia improves.   Assessment/Plan:    Principal Problem: Acute respiratory failure with hypoxia / multifocal pneumonia / healthcare associated pneumonia / pulmonary edema - Ongoing hypoxia likely due to combination of metastatic lung cancer, pneumonia and possible pulmonary edema based on chest x-ray 07/01/2014. - He is blood pressure was on soft side for which reason this limited the use of Lasix. He did get one dose of Lasix 20 mg IV on 07/01/2014. - So far, blood cultures are negative to date. Strep pneumonia and legionella are also negative. -  Please note that the patient was on broad-spectrum antibiotically, vancomycin and Fortaz from the time of the admission. We transitioned to Levaquin on 07/01/2014. - Per pulmonary, if respiratory status does not improve then consider transitioning to comfort.  Active Problem: Metastatic non small cell lung cancer with brain mets - Status post stereotactic radiotherapy to 3 brian lesions completed in 11/2012 under Dr. Ida Rogue care - Status post palliative radiotherapy to left lower lung mass completed 12/2012 - Status post systemic chemotherapy and now on maintenance chemotherapy, last infusion in 03/2014. Not given in 04/2014 due to intolerance and low cell counts. - As noted above, palliative care was consulted for goals of care.  Anemia of chronic disease - Secondary to history of malignancy. - Hemoglobin is stable at 8.2. - No current indications for transfusion.  Chronic kidney disease, stage 3 - Creatinine at baseline 2.0 (06/17/14) - Creatinine currently 1.7, at baseline.  Thrombocytopenia - Secondary to history of malignancy.  - Platelet count is low although stable. It is 57 on 07/02/2014. - Follow-up CBC tomorrow morning.  Brain metastases - Continue Keppra. No reports of seizures.  - Continue Decadron 4 mg twice daily.   Severe protein calorie malnutrition - In the context of chronic illness  - Nutrition consulted - Continue nutritional supplementation as patient able to tolerate.  Dyslipidemia - Continue statin therapy.   DVT Prophylaxis  - SCD's bilaterally while patient in hospital.   Code Status: DNR/DNI Family Communication: plan of care discussed with the patient; I spoke with his wife over the phone 07/02/2014. Disposition Plan: Because of high oxygen requirements patient will remain in step down unit for next 24 hours.  IV access:  Peripheral IV  Procedures and diagnostic studies:    Dg Chest Port 1 View 06/23/2014 1. Persistent had bilateral  interstitial and airspace opacity, likely representing pulmonary edema and CHF. Multifocal pneumonia is considered less likely. 2. 3 cm RIGHT lower lobe cavitary lesion better seen on today's examination in the RIGHT infrahilar region. 3. Small RIGHT pleural effusion unchanged.    Ct Angio Chest Pe W/cm &/or Wo Cm 06/26/2014 1. No evidence of pulmonary embolus. 2. Diffuse hazy airspace opacification noted involving much of the left lower lobe and lingula, and portions of the right middle and lower lobes, concerning for multifocal pneumonia. 3. Underlying cavitary mass at the right infrahilar region appears mildly increased in size, measuring 4.4 x 3.1 x 3.5 cm. Additional smaller nodule at the right lower lobe is also slightly more prominent, measuring 1.9 cm. This is concerning for mild interval progression of lung cancer. 4. Mildly increased small right pleural effusion noted. 5. Underlying bilateral emphysematous change seen. 6. Mildly prominent subcarinal node, measuring 1.1 cm in short axis, more prominent than on the prior study.   Dg Chest Port 1 View 06/30/2014 Slight increase in airspace opacity in the right lower lobe. There is a combination of patchy interstitial and alveolar opacity bilaterally. Question a degree of underlying congestive heart failure. The areas of airspace opacity may represent superimposed pneumonia. Both edema and pneumonia may exist concurrently. No change in cardiac silhouette.   Dg Chest Port 1 View 06/29/2014 Mild worsening of diffuse bilateral pulmonary airspace disease, which may be due to diffuse pulmonary edema or infection. No significant change in right hilar mass and small right pleural effusion.  Medical Consultants:  PCCM Palliative care  Other Consultants:  Physical therapy Nutrition  IAnti-Infectives:   Doxycycline 6/10 -->6/12  Vancomycin 6/12 --> 2022/07/10 Fortaz 6/12 --> 2022-07-10 Levaquin 07-10-22 -->    Gyneth Hubka, MD  [facility] Pager  [phone]  Time spent in minutes: 25 minutes  If 7PM-7AM, please contact night-coverage [email or web address] Password [facility] 07/02/2014, 11:34 AM   LOS: 7 days    HPI/Subjective: No acute overnight events. Patient reports feeling short of breath. He is on Ventimask  Objective: Filed Vitals:   07/02/14 0200 07/02/14 0400 07/02/14 0600 07/02/14 0800  BP: 115/74 112/73 119/69 121/86  Pulse: 102 106 106 116  Temp:  98.2 F (36.8 C)    TempSrc:  Axillary    Resp: '17 24 25 30  '$ Height:      Weight:      SpO2: 100% 99% 100% 100%    Intake/Output Summary (Last 24 hours) at 07/02/14 1134 Last data filed at 07/02/14 0300  Gross per 24 hour  Intake    640 ml  Output   1310 ml  Net   -670 ml    Exam:   General:  Pt is alert, has a Ventimask, no distress  Cardiovascular: Tachycardic, appreciate S1-S2  Respiratory: No wheezing, rhonchorous, bilateral air entry  Abdomen: Soft, non tender, non distended, bowel sounds present  Extremities: No edema, pulses DP and PT palpable bilaterally  Neuro: Grossly nonfocal  Data Reviewed: Basic Metabolic Panel:  Recent Labs Lab 06/26/14 0300 06/27/14 0452 06/28/14 0506 06/29/14 1112 06/30/14 0445 07/02/14 0530  NA 134* 135 136 135 137 135  K 3.9 3.9 4.0 3.6 3.6 3.8  CL 101 102 103 101 104 101  CO2 '25 25 24 23 23 25  '$ GLUCOSE 119* 125* 131* 155* 146* 117*  BUN 34* 35* 42* 42* 42*  44*  CREATININE 1.66* 1.55* 1.92* 1.74* 1.61* 1.70*  CALCIUM 8.2* 8.4* 8.5* 8.6* 8.5* 8.6*  MG 1.5*  --   --   --   --   --   PHOS 3.6  --   --   --   --   --    Liver Function Tests:  Recent Labs Lab 06/25/14 1636 06/26/14 0300  AST 29 25  ALT 20 17  ALKPHOS 117 105  BILITOT 0.7 1.5*  PROT 5.8* 5.5*  ALBUMIN 2.5* 2.2*   No results for input(s): LIPASE, AMYLASE in the last 168 hours. No results for input(s): AMMONIA in the last 168 hours. CBC:  Recent Labs Lab 06/25/14 1636  06/28/14 0506 06/29/14 1112 06/30/14 0445 07/01/14 1241  07/02/14 0530  WBC 6.1  < > 6.7 7.5 6.9 8.3 7.4  NEUTROABS 5.7  --   --   --   --   --   --   HGB 7.2*  < > 8.0* 8.5* 7.9* 7.4* 8.2*  HCT 20.7*  < > 23.8* 25.0* 23.4* 23.2* 23.8*  MCV 90.8  < > 91.9 90.9 91.4 91.3 93.0  PLT 68*  < > 58* 60* 59* 47* 57*  < > = values in this interval not displayed. Cardiac Enzymes:  Recent Labs Lab 06/25/14 1636 06/25/14 2150 06/26/14 0300 06/26/14 0915  TROPONINI 0.04* 0.04* 0.04* 0.04*   BNP: Invalid input(s): POCBNP CBG: No results for input(s): GLUCAP in the last 168 hours.  Recent Results (from the past 240 hour(s))  Culture, blood (routine x 2) Call MD if unable to obtain prior to antibiotics being given     Status: None (Preliminary result)   Collection Time: 06/27/14  3:55 PM  Result Value Ref Range Status   Specimen Description BLOOD LEFT ARM  Final   Special Requests   Final    BOTTLES DRAWN AEROBIC AND ANAEROBIC [place]   Culture   Final           BLOOD CULTURE RECEIVED NO GROWTH TO DATE CULTURE WILL BE HELD FOR 5 DAYS BEFORE ISSUING A FINAL NEGATIVE REPORT Performed at [facility]    Report Status PENDING  Incomplete  Culture, blood (routine x 2) Call MD if unable to obtain prior to antibiotics being given     Status: None (Preliminary result)   Collection Time: 06/27/14  4:00 PM  Result Value Ref Range Status   Specimen Description BLOOD LEFT ARM  Final   Special Requests   Final    BOTTLES DRAWN AEROBIC AND ANAEROBIC  [place] BOTH BOTTLES   Culture   Final           BLOOD CULTURE RECEIVED NO GROWTH TO DATE CULTURE WILL BE HELD FOR 5 DAYS BEFORE ISSUING A FINAL NEGATIVE REPORT Performed at [facility]    Report Status PENDING  Incomplete  MRSA PCR Screening     Status: None   Collection Time: 07/01/14  5:15 PM  Result Value Ref Range Status   MRSA by PCR NEGATIVE NEGATIVE Final     Scheduled Meds: . dexamethasone  4 mg Oral BID  . docusate sodium  100 mg Oral BID  . feeding supplement (ENSURE  ENLIVE)  237 mL Oral BID BM  . guaiFENesin  600 mg Oral BID  . levETIRAcetam  500 mg Oral BID  . levofloxacin (LEVAQUIN) IV  750 mg Intravenous Q48H  . simvastatin  10 mg Oral QHS

## 2014-07-02 NOTE — Progress Notes (Signed)
CSW continuing to follow.   CSW noted that pt transferred to step down unit.   CSW updated [place] as pt was planning for [facility] following hospitalization.   CSW to continue to follow to provide support and assist with pt disposition needs as appropriate.  Alison Murray, MSW, [place] [facility] [phone]

## 2014-07-03 ENCOUNTER — Encounter (HOSPITAL_COMMUNITY): Payer: Self-pay | Admitting: Internal Medicine

## 2014-07-03 DIAGNOSIS — I3139 Other pericardial effusion (noninflammatory): Secondary | ICD-10-CM

## 2014-07-03 DIAGNOSIS — J90 Pleural effusion, not elsewhere classified: Secondary | ICD-10-CM | POA: Diagnosis present

## 2014-07-03 DIAGNOSIS — I700 Atherosclerosis of aorta: Secondary | ICD-10-CM

## 2014-07-03 DIAGNOSIS — J189 Pneumonia, unspecified organism: Principal | ICD-10-CM

## 2014-07-03 DIAGNOSIS — C3491 Malignant neoplasm of unspecified part of right bronchus or lung: Secondary | ICD-10-CM

## 2014-07-03 DIAGNOSIS — R569 Unspecified convulsions: Secondary | ICD-10-CM

## 2014-07-03 DIAGNOSIS — I313 Pericardial effusion (noninflammatory): Secondary | ICD-10-CM

## 2014-07-03 DIAGNOSIS — T380X5A Adverse effect of glucocorticoids and synthetic analogues, initial encounter: Secondary | ICD-10-CM | POA: Diagnosis present

## 2014-07-03 DIAGNOSIS — R0902 Hypoxemia: Secondary | ICD-10-CM | POA: Insufficient documentation

## 2014-07-03 DIAGNOSIS — N183 Chronic kidney disease, stage 3 (moderate): Secondary | ICD-10-CM

## 2014-07-03 DIAGNOSIS — I70409 Unspecified atherosclerosis of autologous vein bypass graft(s) of the extremities, unspecified extremity: Secondary | ICD-10-CM

## 2014-07-03 DIAGNOSIS — J984 Other disorders of lung: Secondary | ICD-10-CM

## 2014-07-03 DIAGNOSIS — R531 Weakness: Secondary | ICD-10-CM | POA: Insufficient documentation

## 2014-07-03 DIAGNOSIS — D638 Anemia in other chronic diseases classified elsewhere: Secondary | ICD-10-CM

## 2014-07-03 DIAGNOSIS — J441 Chronic obstructive pulmonary disease with (acute) exacerbation: Secondary | ICD-10-CM

## 2014-07-03 DIAGNOSIS — J9601 Acute respiratory failure with hypoxia: Secondary | ICD-10-CM

## 2014-07-03 DIAGNOSIS — E43 Unspecified severe protein-calorie malnutrition: Secondary | ICD-10-CM

## 2014-07-03 DIAGNOSIS — C7931 Secondary malignant neoplasm of brain: Secondary | ICD-10-CM

## 2014-07-03 DIAGNOSIS — R739 Hyperglycemia, unspecified: Secondary | ICD-10-CM

## 2014-07-03 HISTORY — DX: Pleural effusion, not elsewhere classified: J90

## 2014-07-03 HISTORY — DX: Adverse effect of glucocorticoids and synthetic analogues, initial encounter: R73.9

## 2014-07-03 HISTORY — DX: Hyperglycemia, unspecified: T38.0X5A

## 2014-07-03 HISTORY — DX: Atherosclerosis of aorta: I70.0

## 2014-07-03 HISTORY — DX: Other pericardial effusion (noninflammatory): I31.39

## 2014-07-03 HISTORY — DX: Pericardial effusion (noninflammatory): I31.3

## 2014-07-03 LAB — BASIC METABOLIC PANEL
Anion gap: 10 (ref 5–15)
BUN: 44 mg/dL — AB (ref 6–20)
CHLORIDE: 102 mmol/L (ref 101–111)
CO2: 26 mmol/L (ref 22–32)
Calcium: 8.7 mg/dL — ABNORMAL LOW (ref 8.9–10.3)
Creatinine, Ser: 1.66 mg/dL — ABNORMAL HIGH (ref 0.61–1.24)
GFR calc Af Amer: 44 mL/min — ABNORMAL LOW (ref >60)
GFR calc non Af Amer: 38 mL/min — ABNORMAL LOW (ref >60)
GLUCOSE: 103 mg/dL — AB (ref 65–99)
POTASSIUM: 3.9 mmol/L (ref 3.5–5.1)
Sodium: 138 mmol/L (ref 135–145)

## 2014-07-03 LAB — CULTURE, BLOOD (ROUTINE X 2)
Culture: NO GROWTH
Culture: NO GROWTH

## 2014-07-03 LAB — CBC
HCT: 25.2 % — ABNORMAL LOW (ref 39.0–52.0)
HEMOGLOBIN: 8.6 g/dL — AB (ref 13.0–17.0)
MCH: 32.1 pg (ref 26.0–34.0)
MCHC: 34.1 g/dL (ref 30.0–36.0)
MCV: 94 fL (ref 78.0–100.0)
PLATELETS: 72 10*3/uL — AB (ref 150–400)
RBC: 2.68 MIL/uL — ABNORMAL LOW (ref 4.22–5.81)
RDW: 21 % — ABNORMAL HIGH (ref 11.5–15.5)
WBC: 7.8 10*3/uL (ref 4.0–10.5)

## 2014-07-03 MED ORDER — CETYLPYRIDINIUM CHLORIDE 0.05 % MT LIQD
7.0000 mL | Freq: Two times a day (BID) | OROMUCOSAL | Status: DC
  Administered 2014-07-03 – 2014-07-06 (×6): 7 mL via OROMUCOSAL

## 2014-07-03 NOTE — Progress Notes (Signed)
Received phone call from patient's wife Mrs Maybee: She is agreeable to hospice consult She wishes to explore transfer to hospice facility at the time of discharge instead of home with hospice She is anticipating D/C to hospice facility in [place] on 07-06-14.  Discussed with Mrs Stockinger in detail, all questions answered, will consult social work for hospice consult on Monday 06-2014 Loistine Chance, MD Palliative Care

## 2014-07-03 NOTE — Progress Notes (Signed)
Daily Progress Note   Patient Name: [name]       Date: 07/03/2014 DOB: November 19, 1933  Age: 81 y.o. MRN#: [id number] Attending Physician: Venetia Maxon Rama, MD Primary Care Physician: [name], MD Admit Date: 06/25/2014  Reason for Consultation/Follow-up: Establishing goals of care  Subjective:  patient appears much better this am, states he feels better. Not confused, answers all questions appropriately.  Wife not currently at bedside Patient denies dyspnea or pain.  LBM 3-4 days ago Around 75% meals Now off VM, on 6L [place] Did not receive any prn Xanax.   Length of Stay: 8 days  Current Medications: Scheduled Meds:  . dexamethasone  4 mg Oral BID  . feeding supplement (ENSURE ENLIVE)  237 mL Oral BID BM  . guaiFENesin  600 mg Oral BID  . levETIRAcetam  500 mg Oral BID  . levofloxacin (LEVAQUIN) IV  750 mg Intravenous Q48H  . senna-docusate  1 tablet Oral BID  . simvastatin  10 mg Oral QHS  . sodium chloride  3 mL Intravenous Q12H    Continuous Infusions: . sodium chloride 10 mL/hr (06/30/14 1724)    PRN Meds: acetaminophen **OR** acetaminophen, albuterol, ALPRAZolam, alum & mag hydroxide-simeth, HYDROcodone-acetaminophen, ipratropium, ondansetron **OR** ondansetron (ZOFRAN) IV, polyethylene glycol, sodium chloride  Palliative Performance Scale: 40%     Vital Signs: BP 116/72 mmHg  Pulse 105  Temp(Src) 97.8 F (36.6 C) (Axillary)  Resp 22  Ht '5\' 11"'$  (1.803 m)  Wt 66.3 kg (146 lb 2.6 oz)  BMI 20.39 kg/m2  SpO2 100% SpO2: SpO2: 100 % O2 Device: O2 Device: NRB O2 Flow Rate: O2 Flow Rate (L/min): 15 L/min  Intake/output summary:   Intake/Output Summary (Last 24 hours) at 07/03/14 1004 Last data filed at 07/03/14 0600  Gross per 24 hour  Intake    280 ml  Output   3300 ml  Net  -3020 ml   LBM:   Baseline Weight: Weight: 67.3 kg (148 lb 5.9 oz) (wearing street clothes) Most recent weight: Weight: 66.3 kg (146 lb 2.6 oz)  Physical Exam:  General:  elderly gentleman, awake alert NAD Scattered rhonchi S1 S2 Abdomen soft non tender No edema               Additional Data Reviewed: Recent Labs     07/02/14  0530  07/03/14  0700  WBC  7.4  7.8  HGB  8.2*  8.6*  PLT  57*  72*  NA  135  138  BUN  44*  44*  CREATININE  1.70*  1.66*     Problem List:  Patient Active Problem List   Diagnosis Date Noted  . 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 Pleural effusion, right 07/03/2014  . Pericardial effusion 07/03/2014  . Steroid-induced hyperglycemia 07/03/2014  . HCAP (healthcare-associated pneumonia) 07/02/2014  . Acute respiratory failure with hypoxia 07/02/2014  . CKD (chronic kidney disease) stage 3, GFR 30-59 ml/min 07/02/2014  . Thrombocytopenia 07/02/2014  . Dyslipidemia 07/02/2014  . Anemia of chronic disease 07/02/2014  . Lung cancer, primary, with metastasis from lung to other site   . Encounter for palliative care   . COPD exacerbation   . Convulsions/seizures 04/23/2014  . Brain metastasis 12/02/2012     Palliative Care Assessment & Plan    Code Status:  DNR  Goals of Care:  Discussed with patient this am. He states he is aware of his cancer progression, declining functional status.  He states he recognizes the toll his illness has placed on his wife and son. He is open to discussing options about adding Hospice services at the time of discharge. I will discuss with Mrs Blixt also prior to initiating the consult. She is not at the bedside, currently unavailable by phone.    3. Symptom Management:     Continue PRNs such as Maalox, senokot, Xanax   4. Palliative Prophylaxis:  Stool Softener: yes.   5. Prognosis: likely weeks to months.   5. Discharge Planning: could potentially make the case for home with hospice, if O2 requirements decrease.     Care plan was discussed with  patient, RN  Thank you for allowing the Palliative Medicine Team to assist  in the care of this patient.   Time In: 0900 Time Out: 0925 Total Time 25 Prolonged Time Billed  no     Greater than 50%  of this time was spent counseling and coordinating care related to the above assessment and plan.   Loistine Chance, MD  07/03/2014, 10:04 AM  Please contact Palliative Medicine Team phone at [phone] for questions and concerns.

## 2014-07-03 NOTE — Progress Notes (Signed)
Progress Note   [name] DOA: 06/25/2014 PCP: [name], MD   Brief Narrative:   [name] is an 81 y.o. male with a PMH of metastatic non-small cell lung cancer, brain metastasis status post stereotactic radiotherapy completed 11/2012, status post palliative radiation therapy to left lower lung mass completed 12/2012, status post chemotherapy (currently on maintenance chemotherapy) who was admitted 06/25/14 with hypoxia secondary to bilateral pneumonia. Hospital course has been complicated by ongoing hypoxic respiratory failure with high oxygen requirement necessitating transfer to the SDU 07/01/14. Palliative care now following patient.  Assessment/Plan:   Principal Problem: Acute respiratory failure with hypoxia / multifocal pneumonia / healthcare associated pneumonia / pulmonary edema /COPD exacerbation - Currently maintaining oxygen saturations on high flow oxygen. - Given Lasix 07/01/14 to address pulmonary edema - Blood cultures are negative to date. Strep pneumonia and legionella are also negative. - Status post treatment with vancomycin and Fortaz 06/25/14-07/01/14. He was transitioned to [facility] on 07/01/2014. - Seen and evaluated by pulmonology with recommendations to transition to comfort care if not improved. - Palliative medicine team called 07/01/14, requests ongoing aggressive treatment of pneumonia at this time.  Active Problem: Metastatic non small cell lung cancer with brain mets with right pleural effusion and pericardial effusion - Status post stereotactic radiotherapy to 3 brian lesions completed in 11/2012 under Dr. Ida Rogue care. - Status post palliative radiotherapy to left lower lung mass completed 12/2012. - Status post systemic chemotherapy and now on maintenance chemotherapy, last infusion in 03/2014. - As noted above, palliative care was consulted for goals of care.  Anemia of chronic disease - Secondary to history  of malignancy. - No current indications for transfusion.  Chronic kidney disease, stage 3 - Creatinine at baseline 2.0 (06/17/14), current creatinine consistent with usual baseline values.  Thrombocytopenia - Secondary to history of malignancy.  - Platelet count is low although stable. Monitor for signs of bleeding.  Brain metastases - Continue Keppra and Decadron. No reports of seizures.   Severe protein calorie malnutrition - In the context of chronic illness and malignancy. - Being followed by dietitian. - Continue nutritional supplementation as patient able to tolerate.  Dyslipidemia with advanced atherosclerosis of thoracic and abdominal aorta and branch vessels - Continue statin therapy.  Steroid-induced hyperglycemia - Hyperglycemia likely related to steroid-induced. Glucose is 117-155 on morning chemistries. Would not treat, mild.  DVT Prophylaxis  - SCD's bilaterally while patient in hospital.   Code Status: DNR/DNI Family Communication: Sister updated at the bedside. Disposition Plan: Possibly home with hospice when oxygen requirement improved, likely several more days.  IV Access:    Port-A-Cath   Procedures and diagnostic studies:   Dg Chest 2 View  06/26/2014   CLINICAL DATA:  Cough. Shortness of breath and weakness. History of right lower lobe lung cancer status post radiation and chemotherapy.  EXAM: CHEST  2 VIEW  COMPARISON:  06/25/2014  FINDINGS: Right jugular Port-A-Cath remains in place with tip overlying the cavoatrial junction. Cardiac silhouette is within normal limits for size. Thoracic aortic calcification is noted. Post treatment changes are again seen in the right hilar region with persistent masslike opacity in architectural distortion, similar to the prior study. Interstitial markings remain coarse diffusely, not significantly changed. Patchy, slightly more confluent opacities in both lower lungs appear slightly increased, likely in the right  middle lobe and lingula. A small right pleural effusion is again seen, unchanged. No pneumothorax. No acute osseous abnormality is identified.  IMPRESSION: Similar appearance of diffuse interstitial densities, which may reflect acute bronchitis or edema, with minimally increased patchy opacities in both lung bases which may reflect superimposed pneumonia. Small right pleural effusion.   Electronically Signed   By: Logan Bores   On: 06/26/2014 09:18   Dg Elbow Complete Right  06/25/2014   CLINICAL DATA:  Acute onset of right elbow injury. Status post fall backwards, hitting window with right arm. Posterior medial right elbow hematoma. Initial encounter.  EXAM: RIGHT ELBOW - COMPLETE 3+ VIEW  COMPARISON:  None.  FINDINGS: There is no evidence of fracture or dislocation. The visualized joint spaces are preserved. No significant joint effusion is identified. The known soft tissue hematoma is not well characterized on radiograph.  IMPRESSION: No evidence of fracture or dislocation.   Electronically Signed   By: Garald Balding M.D.   On: 06/25/2014 17:40   Ct Chest W Contrast  06/11/2014   CLINICAL DATA:  Non small cell lung cancer with brain metastasis diagnosed in August 2014. Chemotherapy completed March 2016. Radiation therapy completed.  EXAM: CT CHEST, ABDOMEN, AND PELVIS WITH CONTRAST  TECHNIQUE: Multidetector CT imaging of the chest, abdomen and pelvis was performed following the standard protocol during bolus administration of intravenous contrast.  CONTRAST:  58m OMNIPAQUE IOHEXOL 300 MG/ML  SOLN  COMPARISON:  Chest CT 03/04/2014  FINDINGS: CT CHEST FINDINGS  Chest wall: Stable right-sided Port-A-Cath. No supraclavicular or axillary lymphadenopathy. The thyroid gland is grossly normal. The bony thorax is intact. No destructive bone lesions or spinal canal compromise. Stable T10 compression deformity.  Mediastinum: The heart is normal in size. Small pericardial effusion is stable. No enlarged mediastinal  or hilar lymph nodes. Small scattered nodes are stable. The aorta demonstrates stable atherosclerotic calcifications. No dissection. The esophagus is grossly normal.  Lungs/pleura: The right-sided pleural effusion is slightly smaller. Stable emphysematous changes and extensive radiation changes involving the right lower lobe paramediastinal lung. Stable right lower lobe pulmonary nodule which measures 17 x 17 mm on image number 30. It measured 17.5 x 17 mm on the prior study. More superiorly there is a stable cavitary lesion in the right infrahilar area measuring a maximum of 3.7 x 2.9 cm. It previously measured 4.7 x 2.7 cm.  Chronic interstitial lung disease and emphysema.  CT ABDOMEN AND PELVIS FINDINGS  Hepatobiliary: No focal hepatic lesions to suggest metastatic disease. The gallbladder is surgically absent. No intra or extrahepatic biliary dilatation.  Pancreas: Normal. A duodenum diverticulum is noted near the pancreatic head.  Spleen: Normal size. No focal lesions. Perisplenic collateral vessels are noted.  Adrenals/Urinary Tract: The adrenal glands and kidneys are unremarkable.  Stomach/Bowel: The stomach, duodenum, small bowel and colon are unremarkable. No inflammatory changes, mass lesions or obstructive findings. The terminal ileum is normal. The appendix is normal.  Vascular/Lymphatic: Stable advanced aortoiliac atherosclerotic calcifications. The branch vessels are patent. The major venous structures are patent.  Other: The bladder, prostate gland and seminal vesicles are unremarkable. No pelvic mass or adenopathy. No free pelvic fluid collections. No inguinal mass or adenopathy.  Musculoskeletal: No significant bony findings. Osteoporosis and lumbar degenerative changes are noted.  IMPRESSION: 1. Stable right lower lobe pulmonary lesions as described above. 2. Stable small pericardial effusion and slight interval decrease in size of the right pleural effusion. 3. Stable emphysematous changes and  peripheral interstitial lung disease. No new pulmonary lesions. 4. No findings for metastatic disease involving the abdomen/pelvis. 5. Advanced atherosclerotic calcifications involving the thoracic and abdominal aortas  and branch vessels.   Electronically Signed   By: Marijo Sanes M.D.   On: 06/11/2014 11:35   Ct Angio Chest Pe W/cm &/or Wo Cm  06/26/2014   CLINICAL DATA:  Acute onset of shortness of breath and cough. Current history of lung cancer, recently completed chemotherapy. Severe hypoxia on ambulation. Initial encounter.  EXAM: CT ANGIOGRAPHY CHEST WITH CONTRAST  TECHNIQUE: Multidetector CT imaging of the chest was performed using the standard protocol during bolus administration of intravenous contrast. Multiplanar CT image reconstructions and MIPs were obtained to evaluate the vascular anatomy.  CONTRAST:  27m OMNIPAQUE IOHEXOL 350 MG/ML SOLN  COMPARISON:  Chest radiograph performed earlier today at 8:44 a.m., and CT of the chest performed 06/11/2014  FINDINGS: There is no evidence of pulmonary embolus.  There is diffuse hazy airspace opacification noted involving much of the left lower lobe and lingula, and portions of the right middle and lower lobes, concerning for multifocal pneumonia.  The underlying cavitary mass at the right infrahilar region appears mildly increased in size from the prior study, measuring approximately 4.4 x 3.1 x 3.5 cm. An additional smaller nodule within the right lower lobe is also slightly more prominent, measuring 1.9 cm. A mildly increased small right pleural effusion is noted. Underlying extensive post radiation change is noted at the right lung.  Underlying emphysematous change is seen bilaterally. No pneumothorax is identified.  A right-sided chest port is noted ending about the cavoatrial junction. Trace pericardial fluid remains within normal limits. A mildly prominent subcarinal node is seen, measuring 1.1 cm in short axis. No additional mediastinal  lymphadenopathy is seen. The great vessels are grossly unremarkable. No axillary lymphadenopathy is seen. The thyroid gland is unremarkable in appearance.  The visualized portions of the liver and spleen are unremarkable. The patient is status post cholecystectomy, with clips noted at the gallbladder fossa.  No acute osseous abnormalities are seen. There is a chronic compression deformity involving the superior endplate of TD22  Review of the MIP images confirms the above findings.  IMPRESSION: 1. No evidence of pulmonary embolus. 2. Diffuse hazy airspace opacification noted involving much of the left lower lobe and lingula, and portions of the right middle and lower lobes, concerning for multifocal pneumonia. 3. Underlying cavitary mass at the right infrahilar region appears mildly increased in size, measuring 4.4 x 3.1 x 3.5 cm. Additional smaller nodule at the right lower lobe is also slightly more prominent, measuring 1.9 cm. This is concerning for mild interval progression of lung cancer. 4. Mildly increased small right pleural effusion noted. 5. Underlying bilateral emphysematous change seen. 6. Mildly prominent subcarinal node, measuring 1.1 cm in short axis, more prominent than on the prior study.   Electronically Signed   By: JGarald BaldingM.D.   On: 06/26/2014 19:32   Ct Abdomen Pelvis W Contrast  06/11/2014   CLINICAL DATA:  Non small cell lung cancer with brain metastasis diagnosed in August 2014. Chemotherapy completed March 2016. Radiation therapy completed.  EXAM: CT CHEST, ABDOMEN, AND PELVIS WITH CONTRAST  TECHNIQUE: Multidetector CT imaging of the chest, abdomen and pelvis was performed following the standard protocol during bolus administration of intravenous contrast.  CONTRAST:  825mOMNIPAQUE IOHEXOL 300 MG/ML  SOLN  COMPARISON:  Chest CT 03/04/2014  FINDINGS: CT CHEST FINDINGS  Chest wall: Stable right-sided Port-A-Cath. No supraclavicular or axillary lymphadenopathy. The thyroid gland is  grossly normal. The bony thorax is intact. No destructive bone lesions or spinal canal compromise. Stable T10 compression  deformity.  Mediastinum: The heart is normal in size. Small pericardial effusion is stable. No enlarged mediastinal or hilar lymph nodes. Small scattered nodes are stable. The aorta demonstrates stable atherosclerotic calcifications. No dissection. The esophagus is grossly normal.  Lungs/pleura: The right-sided pleural effusion is slightly smaller. Stable emphysematous changes and extensive radiation changes involving the right lower lobe paramediastinal lung. Stable right lower lobe pulmonary nodule which measures 17 x 17 mm on image number 30. It measured 17.5 x 17 mm on the prior study. More superiorly there is a stable cavitary lesion in the right infrahilar area measuring a maximum of 3.7 x 2.9 cm. It previously measured 4.7 x 2.7 cm.  Chronic interstitial lung disease and emphysema.  CT ABDOMEN AND PELVIS FINDINGS  Hepatobiliary: No focal hepatic lesions to suggest metastatic disease. The gallbladder is surgically absent. No intra or extrahepatic biliary dilatation.  Pancreas: Normal. A duodenum diverticulum is noted near the pancreatic head.  Spleen: Normal size. No focal lesions. Perisplenic collateral vessels are noted.  Adrenals/Urinary Tract: The adrenal glands and kidneys are unremarkable.  Stomach/Bowel: The stomach, duodenum, small bowel and colon are unremarkable. No inflammatory changes, mass lesions or obstructive findings. The terminal ileum is normal. The appendix is normal.  Vascular/Lymphatic: Stable advanced aortoiliac atherosclerotic calcifications. The branch vessels are patent. The major venous structures are patent.  Other: The bladder, prostate gland and seminal vesicles are unremarkable. No pelvic mass or adenopathy. No free pelvic fluid collections. No inguinal mass or adenopathy.  Musculoskeletal: No significant bony findings. Osteoporosis and lumbar degenerative  changes are noted.  IMPRESSION: 1. Stable right lower lobe pulmonary lesions as described above. 2. Stable small pericardial effusion and slight interval decrease in size of the right pleural effusion. 3. Stable emphysematous changes and peripheral interstitial lung disease. No new pulmonary lesions. 4. No findings for metastatic disease involving the abdomen/pelvis. 5. Advanced atherosclerotic calcifications involving the thoracic and abdominal aortas and branch vessels.   Electronically Signed   By: Marijo Sanes M.D.   On: 06/11/2014 11:35   Dg Chest Port 1 View  07/01/2014   CLINICAL DATA:  Congestion congestion.  Short of breath.  EXAM: PORTABLE CHEST - 1 VIEW  COMPARISON:  06/30/2014.  FINDINGS: RIGHT IJ Port-A-Cath appears unchanged. Cardiopericardial silhouette is also unchanged, upper limits of normal for projection. There is diffuse interstitial and alveolar opacity, which appears unchanged compared to the most recent comparison radiographs. Cavitary RIGHT hilar lesion identified on prior CT is better seen than on yesterday's exam but remains subtle, measuring about 3 cm. Small RIGHT pleural effusion.  IMPRESSION: 1. Persistent had bilateral interstitial and airspace opacity, likely representing pulmonary edema and CHF. Multifocal pneumonia is considered less likely. 2. 3 cm RIGHT lower lobe cavitary lesion better seen on today's examination in the RIGHT infrahilar region. 3. Small RIGHT pleural effusion unchanged.   Electronically Signed   By: Dereck Ligas M.D.   On: 07/01/2014 10:40   Dg Chest Port 1 View  06/30/2014   CLINICAL DATA:  Healthcare associated pneumonia  EXAM: PORTABLE CHEST - 1 VIEW  COMPARISON:  June 29, 2014  FINDINGS: Port-A-Cath tip is in the superior vena cava near the cavoatrial junction, stable. No pneumothorax. There is widespread interstitial and patchy alveolar opacity, stable on the left with slight increase in airspace opacity in the right lower lobe. There is been some  mild clearing of right mid lung atelectasis compared to 1 day prior. Heart is upper normal in size with pulmonary vascularity within  normal limits. No adenopathy. No focal bone lesions identified.  IMPRESSION: Slight increase in airspace opacity in the right lower lobe. There is a combination of patchy interstitial and alveolar opacity bilaterally. Question a degree of underlying congestive heart failure. The areas of airspace opacity may represent superimposed pneumonia. Both edema and pneumonia may exist concurrently. No change in cardiac silhouette.   Electronically Signed   By: Lowella Grip III M.D.   On: 06/30/2014 07:18   Dg Chest Port 1 View  06/29/2014   CLINICAL DATA:  Healthcare associated pneumonia. Shortness of breath and hypoxia. Metastatic right lower lobe lung carcinoma. Chronic kidney disease.  EXAM: PORTABLE CHEST - 1 VIEW  COMPARISON:  06/26/2014  FINDINGS: Heart size remains stable and within normal limits. Right hilar mass remains stable. Tiny right pleural effusion is unchanged.  Diffuse bilateral pulmonary airspace disease appears increased since previous study, which may be due to diffuse pulmonary edema or infection. Right-sided power port remains in appropriate position. No pneumothorax visualized.  IMPRESSION: Mild worsening of diffuse bilateral pulmonary airspace disease, which may be due to diffuse pulmonary edema or infection.  No significant change in right hilar mass and small right pleural effusion.   Electronically Signed   By: Earle Gell M.D.   On: 06/29/2014 09:25   Dg Chest Port 1 View  06/25/2014   CLINICAL DATA:  81 year old male with shortness of breath and weakness for 1 week. History of asthma and lung cancer.  EXAM: PORTABLE CHEST - 1 VIEW  COMPARISON:  Chest x-ray 04/19/2014.  Chest CT 06/11/2014.  FINDINGS: Post treatment related changes in the perihilar aspect of the right lung are again noted, where there is a persistent mass like opacity on today's chest  x-ray, which appears slightly more prominent than prior chest x-ray from 04/19/2014. Coarse interstitial markings are noted throughout the lungs bilaterally. Mild diffuse bronchial wall thickening. Small right and trace left pleural effusions. Heart size is normal. Atherosclerosis in the thoracic aorta. Right internal jugular single-lumen porta cath with tip terminating in the superior aspect of the right atrium.  IMPRESSION: 1. Diffuse bronchial wall thickening and interstitial prominence increased compared to the prior examination. Findings may reflect an acute bronchitis, or could be related to noncardiogenic edema. 2. Post treatment related changes of prior radiation therapy in the perihilar aspect of the right lung again noted. 3. Small right and trace left pleural effusion. 4. Atherosclerosis.   Electronically Signed   By: Vinnie Langton M.D.   On: 06/25/2014 16:44     Medical Consultants:    Palliative Care  Pulmonology  Anti-Infectives:    Doxycycline 6/10 -->6/12   Vancomycin 6/12 --> 6/16  Fortaz 6/12 --> 6/16  Levaquin 6/16 -->   Subjective:    [name] is sitting up with visitors at the bedside, reports some improvement in his breathing.occasional cough. Says he is eating. No BM in several days. No nausea or vomiting.  Objective:    Filed Vitals:   07/02/14 2000 07/03/14 0000 07/03/14 0400 07/03/14 0600  BP: 115/71 118/79 118/75 116/72  Pulse: 108 103 108 105  Temp: 97.8 F (36.6 C) 98 F (36.7 C) 97.8 F (36.6 C)   TempSrc: Oral Axillary Axillary   Resp: '26 24 19 22  '$ Height:      Weight:   66.3 kg (146 lb 2.6 oz)   SpO2: 100% 100% 100% 100%    Intake/Output Summary (Last 24 hours) at 07/03/14 0743 Last data filed at 07/03/14 0600  Gross per 24 hour  Intake    430 ml  Output   3300 ml  Net  -2870 ml    Exam: Gen:  NAD Cardiovascular:  Tachy, No M/R/G Respiratory:  Lungs diminished with scattered rhonchi Gastrointestinal:  Abdomen soft,  NT/ND, + BS Extremities:  No C/E/C   Data Reviewed:    Labs: Basic Metabolic Panel:  Recent Labs Lab 06/27/14 0452 06/28/14 0506 06/29/14 1112 06/30/14 0445 07/02/14 0530  NA 135 136 135 137 135  K 3.9 4.0 3.6 3.6 3.8  CL 102 103 101 104 101  CO2 '25 24 23 23 25  '$ GLUCOSE 125* 131* 155* 146* 117*  BUN 35* 42* 42* 42* 44*  CREATININE 1.55* 1.92* 1.74* 1.61* 1.70*  CALCIUM 8.4* 8.5* 8.6* 8.5* 8.6*   GFR Estimated Creatinine Clearance: 33.6 mL/min (by C-G formula based on Cr of 1.7).  CBC:  Recent Labs Lab 06/28/14 0506 06/29/14 1112 06/30/14 0445 07/01/14 1241 07/02/14 0530  WBC 6.7 7.5 6.9 8.3 7.4  HGB 8.0* 8.5* 7.9* 7.4* 8.2*  HCT 23.8* 25.0* 23.4* 23.2* 23.8*  MCV 91.9 90.9 91.4 91.3 93.0  PLT 58* 60* 59* 47* 57*   Cardiac Enzymes:  Recent Labs Lab 06/26/14 0915  TROPONINI 0.04*   Sepsis Labs:  Recent Labs Lab 06/27/14 0452  06/29/14 0600 06/29/14 1112 06/30/14 0445 07/01/14 1241 07/02/14 0530  PROCALCITON 0.28  --  0.67  --   --   --   --   WBC 6.2  < >  --  7.5 6.9 8.3 7.4  < > = values in this interval not displayed. Microbiology Recent Results (from the past 240 hour(s))  Culture, blood (routine x 2) Call MD if unable to obtain prior to antibiotics being given     Status: None   Collection Time: 06/27/14  3:55 PM  Result Value Ref Range Status   Specimen Description BLOOD LEFT ARM  Final   Special Requests   Final    BOTTLES DRAWN AEROBIC AND ANAEROBIC [place]   Culture   Final    NO GROWTH 5 DAYS Performed at [facility]    Report Status 07/03/2014 FINAL  Final  Culture, blood (routine x 2) Call MD if unable to obtain prior to antibiotics being given     Status: None   Collection Time: 06/27/14  4:00 PM  Result Value Ref Range Status   Specimen Description BLOOD LEFT ARM  Final   Special Requests   Final    BOTTLES DRAWN AEROBIC AND ANAEROBIC  [place] BOTH BOTTLES   Culture   Final    NO GROWTH 5 DAYS Performed at  [facility]    Report Status 07/03/2014 FINAL  Final  MRSA PCR Screening     Status: None   Collection Time: 07/01/14  5:15 PM  Result Value Ref Range Status   MRSA by PCR NEGATIVE NEGATIVE Final    Comment:        The GeneXpert MRSA Assay (FDA approved for NASAL specimens only), is one component of a comprehensive MRSA colonization surveillance program. It is not intended to diagnose MRSA infection nor to guide or monitor treatment for MRSA infections.      Medications:   . dexamethasone  4 mg Oral BID  . feeding supplement (ENSURE ENLIVE)  237 mL Oral BID BM  . guaiFENesin  600 mg Oral BID  . levETIRAcetam  500 mg Oral BID  . levofloxacin (LEVAQUIN) IV  750 mg  Intravenous Q48H  . senna-docusate  1 tablet Oral BID  . simvastatin  10 mg Oral QHS  . sodium chloride  3 mL Intravenous Q12H   Continuous Infusions: . sodium chloride 10 mL/hr (06/30/14 1724)    Time spent: 35 minutes.  The patient is medically complex and requires high complexity decision making.    LOS: 8 days   [facility] Pager [phone]. If unable to reach me by pager, please call my cell phone at [phone].  *Please refer to [email or web address], password [email or web address] to get updated schedule on who will round on this patient, as hospitalists switch teams weekly. If 7PM-7AM, please contact night-coverage at [email or web address], password [email or web address] for any overnight needs.  07/03/2014, 7:43 AM

## 2014-07-04 LAB — CBC
HEMATOCRIT: 25.2 % — AB (ref 39.0–52.0)
Hemoglobin: 8.5 g/dL — ABNORMAL LOW (ref 13.0–17.0)
MCH: 32 pg (ref 26.0–34.0)
MCHC: 33.7 g/dL (ref 30.0–36.0)
MCV: 94.7 fL (ref 78.0–100.0)
Platelets: 67 10*3/uL — ABNORMAL LOW (ref 150–400)
RBC: 2.66 MIL/uL — AB (ref 4.22–5.81)
RDW: 20.9 % — ABNORMAL HIGH (ref 11.5–15.5)
WBC: 7.6 10*3/uL (ref 4.0–10.5)

## 2014-07-04 LAB — BASIC METABOLIC PANEL
Anion gap: 12 (ref 5–15)
BUN: 49 mg/dL — ABNORMAL HIGH (ref 6–20)
CO2: 28 mmol/L (ref 22–32)
Calcium: 8.9 mg/dL (ref 8.9–10.3)
Chloride: 100 mmol/L — ABNORMAL LOW (ref 101–111)
Creatinine, Ser: 1.5 mg/dL — ABNORMAL HIGH (ref 0.61–1.24)
GFR calc Af Amer: 50 mL/min — ABNORMAL LOW (ref >60)
GFR calc non Af Amer: 43 mL/min — ABNORMAL LOW (ref >60)
GLUCOSE: 121 mg/dL — AB (ref 65–99)
Potassium: 3.9 mmol/L (ref 3.5–5.1)
Sodium: 140 mmol/L (ref 135–145)

## 2014-07-04 NOTE — Progress Notes (Signed)
ANTIBIOTIC CONSULT NOTE - Follow-up  Pharmacy Consult for Levofloxacin Indication: pneumonia  Allergies  Allergen Reactions  . Asa [Aspirin] Nausea Only  . Sulfa Antibiotics Nausea And Vomiting    Patient Measurements: Height: '5\' 11"'$  (180.3 cm) Weight: 146 lb 2.6 oz (66.3 kg) IBW/kg (Calculated) : 75.3  Vital Signs: Temp: 97.8 F (36.6 C) (06/19 0800) Temp Source: Axillary (06/19 0800) BP: 104/73 mmHg (06/19 0800) Pulse Rate: 122 (06/19 0800) Intake/Output from previous day: 06/18 0701 - 06/19 0700 In: 1020 [P.O.:600; I.V.:270; IV Piggyback:150] Out: 1500 [Urine:1500] Intake/Output from this shift: Total I/O In: 20 [I.V.:20] Out: 300 [Urine:300]  Labs:  Recent Labs  07/02/14 0530 07/03/14 0700 07/04/14 0530  WBC 7.4 7.8 7.6  HGB 8.2* 8.6* 8.5*  PLT 57* 72* 67*  CREATININE 1.70* 1.66* 1.50*   Estimated Creatinine Clearance: 38.1 mL/min (by C-G formula based on Cr of 1.5). No results for input(s): VANCOTROUGH, VANCOPEAK, VANCORANDOM, GENTTROUGH, GENTPEAK, GENTRANDOM, TOBRATROUGH, TOBRAPEAK, TOBRARND, AMIKACINPEAK, AMIKACINTROU, AMIKACIN in the last 72 hours.   Microbiology: Recent Results (from the past 720 hour(s))  Culture, blood (routine x 2) Call MD if unable to obtain prior to antibiotics being given     Status: None   Collection Time: 06/27/14  3:55 PM  Result Value Ref Range Status   Specimen Description BLOOD LEFT ARM  Final   Special Requests   Final    BOTTLES DRAWN AEROBIC AND ANAEROBIC [place]   Culture   Final    NO GROWTH 5 DAYS Performed at [facility]    Report Status 07/03/2014 FINAL  Final  Culture, blood (routine x 2) Call MD if unable to obtain prior to antibiotics being given     Status: None   Collection Time: 06/27/14  4:00 PM  Result Value Ref Range Status   Specimen Description BLOOD LEFT ARM  Final   Special Requests   Final    BOTTLES DRAWN AEROBIC AND ANAEROBIC  [place] BOTH BOTTLES   Culture   Final    NO  GROWTH 5 DAYS Performed at [facility]    Report Status 07/03/2014 FINAL  Final  MRSA PCR Screening     Status: None   Collection Time: 07/01/14  5:15 PM  Result Value Ref Range Status   MRSA by PCR NEGATIVE NEGATIVE Final    Comment:        The GeneXpert MRSA Assay (FDA approved for NASAL specimens only), is one component of a comprehensive MRSA colonization surveillance program. It is not intended to diagnose MRSA infection nor to guide or monitor treatment for MRSA infections.     Medical History: Past Medical History  Diagnosis Date  . COPD (chronic obstructive pulmonary disease)   . Allergy     allergic rhinitis  . Hx of radiation therapy 12/09/12-01/01/13    lung 37.5Gy  . nscl ca w/ brain mets dx'd 08/2012    Patient has a lung mass which is being evaluated  . Atherosclerosis of aorta 07/03/2014  . Pericardial effusion 07/03/2014  . Pleural effusion, right 07/03/2014  . Steroid-induced hyperglycemia 07/03/2014    Assessment: 49 yoM presents with hypoxia, concerns of bronchitis on imaging.  PMH metastatic lung cancer; cavitary lesion noted on CXR.  Started on Levaquin for possible HCAP, but broadened to vanc/ceftazidime with clinical worsening.  Now to re-narrow to Levaquin.  6/10 >> Zosyn x 1  6/10 >> Doxycycline >> 6/12  6/12 >> Levaquin >> 6/12; restart 6/16 >> 6/12 >> Ceftaz >>  6/16 6/12 >> Vanc >> 6/16  6/12 blood: NGTD  Sputum: to be collected  Strep Ag: neg  Legionella Ag: neg  Afebrile WBC WNL Renal: AKI on CKD improving. Good UOP. Contrast given 6/10 for CTA   Goal of Therapy:  Doses adjusted per renal function Eradication of infection  Plan:  Day 10 antibiotics  Continue Levaquin 750 mg IV q48 hrs  F/u cultures, renal function, clinical course  Doreene Eland, PharmD, BCPS.   Pager: [phone] 07/04/2014, 10:15 AM

## 2014-07-04 NOTE — Progress Notes (Signed)
Progress Note   [name]:[id number] DOB: 12-02-1933 DOA: 06/25/2014 PCP: [name], MD   Brief Narrative:   [name] is an 81 y.o. male with a PMH of metastatic non-small cell lung cancer, brain metastasis status post stereotactic radiotherapy completed 11/2012, status post palliative radiation therapy to left lower lung mass completed 12/2012, status post chemotherapy (currently on maintenance chemotherapy) who was admitted 06/25/14 with hypoxia secondary to bilateral pneumonia. Hospital course has been complicated by ongoing hypoxic respiratory failure with high oxygen requirement necessitating transfer to the SDU 07/01/14. Palliative care now following patient, with plans to discharge to a hospice facility.  Assessment/Plan:   Principal Problem: Acute respiratory failure with hypoxia / multifocal pneumonia / healthcare associated pneumonia / pulmonary edema /COPD exacerbation - Currently maintaining oxygen saturations on high flow oxygen (now has a nasal concentrator). - Given Lasix 07/01/14 to address pulmonary edema. - Blood cultures are negative to date. Strep pneumonia and legionella are also negative. - Status post treatment with vancomycin and Fortaz 06/25/14-07/01/14. He was transitioned to [place] on 07/01/2014. - Continue antibiotics through 07/02/14. - Palliative medicine team called 07/01/14, requests ongoing aggressive treatment of pneumonia at this time. - Plan is to transfer to a hospice facility at discharge.  Active Problem: Metastatic non small cell lung cancer with brain mets with right pleural effusion and pericardial effusion - Status post stereotactic radiotherapy to 3 brian lesions completed in 11/2012 under Dr. Ida Rogue care. - Status post palliative radiotherapy to left lower lung mass completed 12/2012. - Status post systemic chemotherapy and now on maintenance chemotherapy, last infusion in 03/2014. - As noted above, palliative care was consulted  for goals of care.  Anemia of chronic disease - Secondary to history of malignancy. - No current indications for transfusion. Hemoglobin stable at 8.5 mg/dL.  Chronic kidney disease, stage 3 - Creatinine at baseline 2.0 (06/17/14), current creatinine consistent with usual baseline values.  Thrombocytopenia - Secondary to history of malignancy.  - Platelet count is low although stable. Monitor for signs of bleeding.  Brain metastases - Continue Keppra and Decadron. No reports of seizures.   Severe protein calorie malnutrition - In the context of chronic illness and malignancy. - Being followed by dietitian. - Continue nutritional supplementation as patient able to tolerate.  Dyslipidemia with advanced atherosclerosis of thoracic and abdominal aorta and branch vessels - Continue statin therapy.  Steroid-induced hyperglycemia - Hyperglycemia likely related to steroid-induced. Glucose is 117-155 on morning chemistries.  - Would not treat in light of overall prognosis. Mild.  DVT Prophylaxis  - SCD's bilaterally while patient in hospital.   Code Status: DNR/DNI Family Communication: No family currently at the bedside, gave patient my business card and told him to have the RN page me if his family would like to speak with me. Disposition Plan: Transfer to floor.  Anticipate home w/ hospice or d/c to residential hospice in next 1-2 days.  IV Access:    Port-A-Cath   Procedures and diagnostic studies:   Dg Chest 2 View  06/26/2014   CLINICAL DATA:  Cough. Shortness of breath and weakness. History of right lower lobe lung cancer status post radiation and chemotherapy.  EXAM: CHEST  2 VIEW  COMPARISON:  06/25/2014  FINDINGS: Right jugular Port-A-Cath remains in place with tip overlying the cavoatrial junction. Cardiac silhouette is within normal limits for size. Thoracic aortic calcification is noted. Post treatment changes are again seen in the right hilar region with  persistent masslike  opacity in architectural distortion, similar to the prior study. Interstitial markings remain coarse diffusely, not significantly changed. Patchy, slightly more confluent opacities in both lower lungs appear slightly increased, likely in the right middle lobe and lingula. A small right pleural effusion is again seen, unchanged. No pneumothorax. No acute osseous abnormality is identified.  IMPRESSION: Similar appearance of diffuse interstitial densities, which may reflect acute bronchitis or edema, with minimally increased patchy opacities in both lung bases which may reflect superimposed pneumonia. Small right pleural effusion.   Electronically Signed   By: Logan Bores   On: 06/26/2014 09:18   Dg Elbow Complete Right  06/25/2014   CLINICAL DATA:  Acute onset of right elbow injury. Status post fall backwards, hitting window with right arm. Posterior medial right elbow hematoma. Initial encounter.  EXAM: RIGHT ELBOW - COMPLETE 3+ VIEW  COMPARISON:  None.  FINDINGS: There is no evidence of fracture or dislocation. The visualized joint spaces are preserved. No significant joint effusion is identified. The known soft tissue hematoma is not well characterized on radiograph.  IMPRESSION: No evidence of fracture or dislocation.   Electronically Signed   By: Garald Balding M.D.   On: 06/25/2014 17:40   Ct Chest W Contrast  06/11/2014   CLINICAL DATA:  Non small cell lung cancer with brain metastasis diagnosed in August 2014. Chemotherapy completed March 2016. Radiation therapy completed.  EXAM: CT CHEST, ABDOMEN, AND PELVIS WITH CONTRAST  TECHNIQUE: Multidetector CT imaging of the chest, abdomen and pelvis was performed following the standard protocol during bolus administration of intravenous contrast.  CONTRAST:  69m OMNIPAQUE IOHEXOL 300 MG/ML  SOLN  COMPARISON:  Chest CT 03/04/2014  FINDINGS: CT CHEST FINDINGS  Chest wall: Stable right-sided Port-A-Cath. No supraclavicular or axillary  lymphadenopathy. The thyroid gland is grossly normal. The bony thorax is intact. No destructive bone lesions or spinal canal compromise. Stable T10 compression deformity.  Mediastinum: The heart is normal in size. Small pericardial effusion is stable. No enlarged mediastinal or hilar lymph nodes. Small scattered nodes are stable. The aorta demonstrates stable atherosclerotic calcifications. No dissection. The esophagus is grossly normal.  Lungs/pleura: The right-sided pleural effusion is slightly smaller. Stable emphysematous changes and extensive radiation changes involving the right lower lobe paramediastinal lung. Stable right lower lobe pulmonary nodule which measures 17 x 17 mm on image number 30. It measured 17.5 x 17 mm on the prior study. More superiorly there is a stable cavitary lesion in the right infrahilar area measuring a maximum of 3.7 x 2.9 cm. It previously measured 4.7 x 2.7 cm.  Chronic interstitial lung disease and emphysema.  CT ABDOMEN AND PELVIS FINDINGS  Hepatobiliary: No focal hepatic lesions to suggest metastatic disease. The gallbladder is surgically absent. No intra or extrahepatic biliary dilatation.  Pancreas: Normal. A duodenum diverticulum is noted near the pancreatic head.  Spleen: Normal size. No focal lesions. Perisplenic collateral vessels are noted.  Adrenals/Urinary Tract: The adrenal glands and kidneys are unremarkable.  Stomach/Bowel: The stomach, duodenum, small bowel and colon are unremarkable. No inflammatory changes, mass lesions or obstructive findings. The terminal ileum is normal. The appendix is normal.  Vascular/Lymphatic: Stable advanced aortoiliac atherosclerotic calcifications. The branch vessels are patent. The major venous structures are patent.  Other: The bladder, prostate gland and seminal vesicles are unremarkable. No pelvic mass or adenopathy. No free pelvic fluid collections. No inguinal mass or adenopathy.  Musculoskeletal: No significant bony findings.  Osteoporosis and lumbar degenerative changes are noted.  IMPRESSION: 1. Stable right  lower lobe pulmonary lesions as described above. 2. Stable small pericardial effusion and slight interval decrease in size of the right pleural effusion. 3. Stable emphysematous changes and peripheral interstitial lung disease. No new pulmonary lesions. 4. No findings for metastatic disease involving the abdomen/pelvis. 5. Advanced atherosclerotic calcifications involving the thoracic and abdominal aortas and branch vessels.   Electronically Signed   By: Marijo Sanes M.D.   On: 06/11/2014 11:35   Ct Angio Chest Pe W/cm &/or Wo Cm  06/26/2014   CLINICAL DATA:  Acute onset of shortness of breath and cough. Current history of lung cancer, recently completed chemotherapy. Severe hypoxia on ambulation. Initial encounter.  EXAM: CT ANGIOGRAPHY CHEST WITH CONTRAST  TECHNIQUE: Multidetector CT imaging of the chest was performed using the standard protocol during bolus administration of intravenous contrast. Multiplanar CT image reconstructions and MIPs were obtained to evaluate the vascular anatomy.  CONTRAST:  60m OMNIPAQUE IOHEXOL 350 MG/ML SOLN  COMPARISON:  Chest radiograph performed earlier today at 8:44 a.m., and CT of the chest performed 06/11/2014  FINDINGS: There is no evidence of pulmonary embolus.  There is diffuse hazy airspace opacification noted involving much of the left lower lobe and lingula, and portions of the right middle and lower lobes, concerning for multifocal pneumonia.  The underlying cavitary mass at the right infrahilar region appears mildly increased in size from the prior study, measuring approximately 4.4 x 3.1 x 3.5 cm. An additional smaller nodule within the right lower lobe is also slightly more prominent, measuring 1.9 cm. A mildly increased small right pleural effusion is noted. Underlying extensive post radiation change is noted at the right lung.  Underlying emphysematous change is seen  bilaterally. No pneumothorax is identified.  A right-sided chest port is noted ending about the cavoatrial junction. Trace pericardial fluid remains within normal limits. A mildly prominent subcarinal node is seen, measuring 1.1 cm in short axis. No additional mediastinal lymphadenopathy is seen. The great vessels are grossly unremarkable. No axillary lymphadenopathy is seen. The thyroid gland is unremarkable in appearance.  The visualized portions of the liver and spleen are unremarkable. The patient is status post cholecystectomy, with clips noted at the gallbladder fossa.  No acute osseous abnormalities are seen. There is a chronic compression deformity involving the superior endplate of TD42  Review of the MIP images confirms the above findings.  IMPRESSION: 1. No evidence of pulmonary embolus. 2. Diffuse hazy airspace opacification noted involving much of the left lower lobe and lingula, and portions of the right middle and lower lobes, concerning for multifocal pneumonia. 3. Underlying cavitary mass at the right infrahilar region appears mildly increased in size, measuring 4.4 x 3.1 x 3.5 cm. Additional smaller nodule at the right lower lobe is also slightly more prominent, measuring 1.9 cm. This is concerning for mild interval progression of lung cancer. 4. Mildly increased small right pleural effusion noted. 5. Underlying bilateral emphysematous change seen. 6. Mildly prominent subcarinal node, measuring 1.1 cm in short axis, more prominent than on the prior study.   Electronically Signed   By: JGarald BaldingM.D.   On: 06/26/2014 19:32   Ct Abdomen Pelvis W Contrast  06/11/2014   CLINICAL DATA:  Non small cell lung cancer with brain metastasis diagnosed in August 2014. Chemotherapy completed March 2016. Radiation therapy completed.  EXAM: CT CHEST, ABDOMEN, AND PELVIS WITH CONTRAST  TECHNIQUE: Multidetector CT imaging of the chest, abdomen and pelvis was performed following the standard protocol during  bolus administration of intravenous  contrast.  CONTRAST:  22m OMNIPAQUE IOHEXOL 300 MG/ML  SOLN  COMPARISON:  Chest CT 03/04/2014  FINDINGS: CT CHEST FINDINGS  Chest wall: Stable right-sided Port-A-Cath. No supraclavicular or axillary lymphadenopathy. The thyroid gland is grossly normal. The bony thorax is intact. No destructive bone lesions or spinal canal compromise. Stable T10 compression deformity.  Mediastinum: The heart is normal in size. Small pericardial effusion is stable. No enlarged mediastinal or hilar lymph nodes. Small scattered nodes are stable. The aorta demonstrates stable atherosclerotic calcifications. No dissection. The esophagus is grossly normal.  Lungs/pleura: The right-sided pleural effusion is slightly smaller. Stable emphysematous changes and extensive radiation changes involving the right lower lobe paramediastinal lung. Stable right lower lobe pulmonary nodule which measures 17 x 17 mm on image number 30. It measured 17.5 x 17 mm on the prior study. More superiorly there is a stable cavitary lesion in the right infrahilar area measuring a maximum of 3.7 x 2.9 cm. It previously measured 4.7 x 2.7 cm.  Chronic interstitial lung disease and emphysema.  CT ABDOMEN AND PELVIS FINDINGS  Hepatobiliary: No focal hepatic lesions to suggest metastatic disease. The gallbladder is surgically absent. No intra or extrahepatic biliary dilatation.  Pancreas: Normal. A duodenum diverticulum is noted near the pancreatic head.  Spleen: Normal size. No focal lesions. Perisplenic collateral vessels are noted.  Adrenals/Urinary Tract: The adrenal glands and kidneys are unremarkable.  Stomach/Bowel: The stomach, duodenum, small bowel and colon are unremarkable. No inflammatory changes, mass lesions or obstructive findings. The terminal ileum is normal. The appendix is normal.  Vascular/Lymphatic: Stable advanced aortoiliac atherosclerotic calcifications. The branch vessels are patent. The major venous  structures are patent.  Other: The bladder, prostate gland and seminal vesicles are unremarkable. No pelvic mass or adenopathy. No free pelvic fluid collections. No inguinal mass or adenopathy.  Musculoskeletal: No significant bony findings. Osteoporosis and lumbar degenerative changes are noted.  IMPRESSION: 1. Stable right lower lobe pulmonary lesions as described above. 2. Stable small pericardial effusion and slight interval decrease in size of the right pleural effusion. 3. Stable emphysematous changes and peripheral interstitial lung disease. No new pulmonary lesions. 4. No findings for metastatic disease involving the abdomen/pelvis. 5. Advanced atherosclerotic calcifications involving the thoracic and abdominal aortas and branch vessels.   Electronically Signed   By: PMarijo SanesM.D.   On: 06/11/2014 11:35   Dg Chest Port 1 View  07/01/2014   CLINICAL DATA:  Congestion congestion.  Short of breath.  EXAM: PORTABLE CHEST - 1 VIEW  COMPARISON:  06/30/2014.  FINDINGS: RIGHT IJ Port-A-Cath appears unchanged. Cardiopericardial silhouette is also unchanged, upper limits of normal for projection. There is diffuse interstitial and alveolar opacity, which appears unchanged compared to the most recent comparison radiographs. Cavitary RIGHT hilar lesion identified on prior CT is better seen than on yesterday's exam but remains subtle, measuring about 3 cm. Small RIGHT pleural effusion.  IMPRESSION: 1. Persistent had bilateral interstitial and airspace opacity, likely representing pulmonary edema and CHF. Multifocal pneumonia is considered less likely. 2. 3 cm RIGHT lower lobe cavitary lesion better seen on today's examination in the RIGHT infrahilar region. 3. Small RIGHT pleural effusion unchanged.   Electronically Signed   By: GDereck LigasM.D.   On: 07/01/2014 10:40   Dg Chest Port 1 View  06/30/2014   CLINICAL DATA:  Healthcare associated pneumonia  EXAM: PORTABLE CHEST - 1 VIEW  COMPARISON:  June 29, 2014  FINDINGS: Port-A-Cath tip is in the superior vena cava near  the cavoatrial junction, stable. No pneumothorax. There is widespread interstitial and patchy alveolar opacity, stable on the left with slight increase in airspace opacity in the right lower lobe. There is been some mild clearing of right mid lung atelectasis compared to 1 day prior. Heart is upper normal in size with pulmonary vascularity within normal limits. No adenopathy. No focal bone lesions identified.  IMPRESSION: Slight increase in airspace opacity in the right lower lobe. There is a combination of patchy interstitial and alveolar opacity bilaterally. Question a degree of underlying congestive heart failure. The areas of airspace opacity may represent superimposed pneumonia. Both edema and pneumonia may exist concurrently. No change in cardiac silhouette.   Electronically Signed   By: Lowella Grip III M.D.   On: 06/30/2014 07:18   Dg Chest Port 1 View  06/29/2014   CLINICAL DATA:  Healthcare associated pneumonia. Shortness of breath and hypoxia. Metastatic right lower lobe lung carcinoma. Chronic kidney disease.  EXAM: PORTABLE CHEST - 1 VIEW  COMPARISON:  06/26/2014  FINDINGS: Heart size remains stable and within normal limits. Right hilar mass remains stable. Tiny right pleural effusion is unchanged.  Diffuse bilateral pulmonary airspace disease appears increased since previous study, which may be due to diffuse pulmonary edema or infection. Right-sided power port remains in appropriate position. No pneumothorax visualized.  IMPRESSION: Mild worsening of diffuse bilateral pulmonary airspace disease, which may be due to diffuse pulmonary edema or infection.  No significant change in right hilar mass and small right pleural effusion.   Electronically Signed   By: Earle Gell M.D.   On: 06/29/2014 09:25   Dg Chest Port 1 View  06/25/2014   CLINICAL DATA:  81 year old male with shortness of breath and weakness for 1 week. History of  asthma and lung cancer.  EXAM: PORTABLE CHEST - 1 VIEW  COMPARISON:  Chest x-ray 04/19/2014.  Chest CT 06/11/2014.  FINDINGS: Post treatment related changes in the perihilar aspect of the right lung are again noted, where there is a persistent mass like opacity on today's chest x-ray, which appears slightly more prominent than prior chest x-ray from 04/19/2014. Coarse interstitial markings are noted throughout the lungs bilaterally. Mild diffuse bronchial wall thickening. Small right and trace left pleural effusions. Heart size is normal. Atherosclerosis in the thoracic aorta. Right internal jugular single-lumen porta cath with tip terminating in the superior aspect of the right atrium.  IMPRESSION: 1. Diffuse bronchial wall thickening and interstitial prominence increased compared to the prior examination. Findings may reflect an acute bronchitis, or could be related to noncardiogenic edema. 2. Post treatment related changes of prior radiation therapy in the perihilar aspect of the right lung again noted. 3. Small right and trace left pleural effusion. 4. Atherosclerosis.   Electronically Signed   By: Vinnie Langton M.D.   On: 06/25/2014 16:44     Medical Consultants:    Palliative Care  Pulmonology  Anti-Infectives:    Doxycycline 6/10 -->6/12   Vancomycin 6/12 --> 6/16  Fortaz 6/12 --> 6/16  Levaquin 6/16 -->   Subjective:   [name] is sitting up eating his breakfast.  Denies pain.  Dyspnea improved, very little cough.  Appetite good.  Objective:    Filed Vitals:   07/04/14 0400 07/04/14 0500 07/04/14 0600 07/04/14 0700  BP: 104/62  104/73   Pulse: 123 109 111 113  Temp: 98.6 F (37 C)     TempSrc: Oral     Resp: '22 19 19 24  '$ Height:  Weight:      SpO2: 96% 99% 100% 100%    Intake/Output Summary (Last 24 hours) at 07/04/14 0707 Last data filed at 07/04/14 0700  Gross per 24 hour  Intake   1020 ml  Output   1500 ml  Net   -480 ml    Exam: Gen:   NAD Cardiovascular:  Tachy, No M/R/G Respiratory:  Lungs diminished, clear Gastrointestinal:  Abdomen soft, NT/ND, + BS Extremities:  No C/E/C   Data Reviewed:    Labs: Basic Metabolic Panel:  Recent Labs Lab 06/29/14 1112 06/30/14 0445 07/02/14 0530 07/03/14 0700 07/04/14 0530  NA 135 137 135 138 140  K 3.6 3.6 3.8 3.9 3.9  CL 101 104 101 102 100*  CO2 '23 23 25 26 28  '$ GLUCOSE 155* 146* 117* 103* 121*  BUN 42* 42* 44* 44* 49*  CREATININE 1.74* 1.61* 1.70* 1.66* 1.50*  CALCIUM 8.6* 8.5* 8.6* 8.7* 8.9   GFR Estimated Creatinine Clearance: 38.1 mL/min (by C-G formula based on Cr of 1.5).  CBC:  Recent Labs Lab 06/30/14 0445 07/01/14 1241 07/02/14 0530 07/03/14 0700 07/04/14 0530  WBC 6.9 8.3 7.4 7.8 7.6  HGB 7.9* 7.4* 8.2* 8.6* 8.5*  HCT 23.4* 23.2* 23.8* 25.2* 25.2*  MCV 91.4 91.3 93.0 94.0 94.7  PLT 59* 47* 57* 72* 67*   Sepsis Labs:  Recent Labs Lab 06/29/14 0600  07/01/14 1241 07/02/14 0530 07/03/14 0700 07/04/14 0530  PROCALCITON 0.67  --   --   --   --   --   WBC  --   < > 8.3 7.4 7.8 7.6  < > = values in this interval not displayed. Microbiology Recent Results (from the past 240 hour(s))  Culture, blood (routine x 2) Call MD if unable to obtain prior to antibiotics being given     Status: None   Collection Time: 06/27/14  3:55 PM  Result Value Ref Range Status   Specimen Description BLOOD LEFT ARM  Final   Special Requests   Final    BOTTLES DRAWN AEROBIC AND ANAEROBIC [place]   Culture   Final    NO GROWTH 5 DAYS Performed at [facility]    Report Status 07/03/2014 FINAL  Final  Culture, blood (routine x 2) Call MD if unable to obtain prior to antibiotics being given     Status: None   Collection Time: 06/27/14  4:00 PM  Result Value Ref Range Status   Specimen Description BLOOD LEFT ARM  Final   Special Requests   Final    BOTTLES DRAWN AEROBIC AND ANAEROBIC  [place] BOTH BOTTLES   Culture   Final    NO GROWTH 5  DAYS Performed at [facility]    Report Status 07/03/2014 FINAL  Final  MRSA PCR Screening     Status: None   Collection Time: 07/01/14  5:15 PM  Result Value Ref Range Status   MRSA by PCR NEGATIVE NEGATIVE Final    Comment:        The GeneXpert MRSA Assay (FDA approved for NASAL specimens only), is one component of a comprehensive MRSA colonization surveillance program. It is not intended to diagnose MRSA infection nor to guide or monitor treatment for MRSA infections.      Medications:   . antiseptic oral rinse  7 mL Mouth Rinse BID  . dexamethasone  4 mg Oral BID  . feeding supplement (ENSURE ENLIVE)  237 mL Oral BID BM  . guaiFENesin  600 mg Oral BID  . levETIRAcetam  500 mg Oral BID  . levofloxacin (LEVAQUIN) IV  750 mg Intravenous Q48H  . senna-docusate  1 tablet Oral BID  . simvastatin  10 mg Oral QHS  . sodium chloride  3 mL Intravenous Q12H   Continuous Infusions: . sodium chloride 500 mL (07/04/14 0228)    Time spent: 35 minutes.  The patient is medically complex and requires high complexity decision making.    LOS: 9 days   [facility] Pager [phone]. If unable to reach me by pager, please call my cell phone at [phone].  *Please refer to [email or web address], password [email or web address] to get updated schedule on who will round on this patient, as hospitalists switch teams weekly. If 7PM-7AM, please contact night-coverage at [email or web address], password [email or web address] for any overnight needs.  07/04/2014, 7:07 AM

## 2014-07-05 ENCOUNTER — Ambulatory Visit: Payer: Self-pay | Admitting: Radiation Oncology

## 2014-07-05 DIAGNOSIS — R059 Cough, unspecified: Secondary | ICD-10-CM | POA: Insufficient documentation

## 2014-07-05 DIAGNOSIS — R05 Cough: Secondary | ICD-10-CM

## 2014-07-05 LAB — BASIC METABOLIC PANEL
Anion gap: 9 (ref 5–15)
BUN: 48 mg/dL — ABNORMAL HIGH (ref 6–20)
CO2: 27 mmol/L (ref 22–32)
Calcium: 8.8 mg/dL — ABNORMAL LOW (ref 8.9–10.3)
Chloride: 99 mmol/L — ABNORMAL LOW (ref 101–111)
Creatinine, Ser: 1.36 mg/dL — ABNORMAL HIGH (ref 0.61–1.24)
GFR calc Af Amer: 56 mL/min — ABNORMAL LOW (ref >60)
GFR calc non Af Amer: 48 mL/min — ABNORMAL LOW (ref >60)
GLUCOSE: 124 mg/dL — AB (ref 65–99)
POTASSIUM: 4 mmol/L (ref 3.5–5.1)
Sodium: 135 mmol/L (ref 135–145)

## 2014-07-05 MED ORDER — NYSTATIN 100000 UNIT/ML MT SUSP
5.0000 mL | Freq: Four times a day (QID) | OROMUCOSAL | Status: DC
  Administered 2014-07-05 – 2014-07-06 (×4): 500000 [IU] via ORAL
  Filled 2014-07-05 (×7): qty 5

## 2014-07-05 MED ORDER — MAGIC MOUTHWASH W/LIDOCAINE
5.0000 mL | Freq: Four times a day (QID) | ORAL | Status: DC | PRN
  Administered 2014-07-05: 5 mL via ORAL
  Filled 2014-07-05 (×2): qty 5

## 2014-07-05 NOTE — Progress Notes (Signed)
Daily Progress Note   Patient Name: [name]       Date: 07/05/2014 DOB: Mar 04, 1933  Age: 81 y.o. MRN#: [id number] Attending Physician: Venetia Maxon Rama, MD Primary Care Physician: [name], MD Admit Date: 06/25/2014  Reason for Consultation/Follow-up: Establishing goals of care  Subjective:  patient resting in bed, in no distress Wants to be home, but is agreeable to transition to inpatient hospice first. Denies any acute dyspnea, no pain, has moved his bowels. No nausea reported. Oral intake is "so-so" Anticipate D/C to hospice facility soon.  Await hospice consult Continue current care.     Length of Stay: 10 days  Current Medications: Scheduled Meds:  . antiseptic oral rinse  7 mL Mouth Rinse BID  . dexamethasone  4 mg Oral BID  . feeding supplement (ENSURE ENLIVE)  237 mL Oral BID BM  . guaiFENesin  600 mg Oral BID  . levETIRAcetam  500 mg Oral BID  . levofloxacin (LEVAQUIN) IV  750 mg Intravenous Q48H  . senna-docusate  1 tablet Oral BID  . simvastatin  10 mg Oral QHS  . sodium chloride  3 mL Intravenous Q12H    Continuous Infusions: . sodium chloride 500 mL (07/04/14 0228)    PRN Meds: acetaminophen **OR** acetaminophen, albuterol, ALPRAZolam, alum & mag hydroxide-simeth, HYDROcodone-acetaminophen, ipratropium, ondansetron **OR** ondansetron (ZOFRAN) IV, polyethylene glycol, sodium chloride  Palliative Performance Scale: 40%     Vital Signs: BP 102/72 mmHg  Pulse 103  Temp(Src) 96.9 F (36.1 C) (Axillary)  Resp 24  Ht '5\' 11"'$  (1.803 m)  Wt 66.3 kg (146 lb 2.6 oz)  BMI 20.39 kg/m2  SpO2 100% SpO2: SpO2: 100 % O2 Device: O2 Device: Oxymizer O2 Flow Rate: O2 Flow Rate (L/min): 10 L/min  Intake/output summary:   Intake/Output Summary (Last 24 hours) at 07/05/14 1018 Last data filed at 07/05/14 0516  Gross per 24 hour  Intake    280 ml  Output    875 ml  Net   -595 ml   LBM:   Baseline Weight: Weight: 67.3 kg (148 lb 5.9 oz) (wearing  street clothes) Most recent weight: Weight: 66.3 kg (146 lb 2.6 oz)  Physical Exam:  General: elderly gentleman, awake alert NAD Scattered rhonchi S1 S2 Abdomen soft non tender No edema               Additional Data Reviewed: Recent Labs     07/03/14  0700  07/04/14  0530  07/05/14  0610  WBC  7.8  7.6   --   HGB  8.6*  8.5*   --   PLT  72*  67*   --   NA  138  140  135  BUN  44*  49*  48*  CREATININE  1.66*  1.50*  1.36*     Problem List:  Patient Active Problem List   Diagnosis Date Noted  . 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 Pleural effusion, right 07/03/2014  . Pericardial effusion 07/03/2014  . Steroid-induced hyperglycemia 07/03/2014  . Weakness generalized   . Hypoxia   . HCAP (healthcare-associated pneumonia) 07/02/2014  . Acute respiratory failure with hypoxia 07/02/2014  . CKD (chronic kidney disease) stage 3, GFR 30-59 ml/min 07/02/2014  . Thrombocytopenia 07/02/2014  . Dyslipidemia 07/02/2014  . Anemia of chronic disease 07/02/2014  . Lung cancer, primary, with metastasis from lung to other site   . Encounter for palliative care   . COPD  exacerbation   . Convulsions/seizures 04/23/2014  . Brain metastasis 12/02/2012     Palliative Care Assessment & Plan    Code Status:  DNR  Goals of Care:  hospice consult pending, likely will go to inpatient hospice. Patient's goal is to come home after going to inpatient hospice.    3. Symptom Management:     Continue PRNs such as Maalox, senokot, Xanax   4. Palliative Prophylaxis:  Stool Softener: yes.   5. Prognosis: likely weeks to months.   5. Discharge Planning: inpatient hospice of [place].   Care plan was discussed with  patient, case management.   Thank you for allowing the Palliative Medicine Team to assist in the care of this patient.   Time In: 1000 Time Out: 1025 Total Time 25 Prolonged Time Billed  no     Greater than 50%   of this time was spent counseling and coordinating care related to the above assessment and plan.   Loistine Chance, MD  07/05/2014, 10:18 AM  Please contact Palliative Medicine Team phone at [phone] for questions and concerns.

## 2014-07-05 NOTE — Progress Notes (Signed)
Progress Note   [name] DJM:[id number] DOB: [birth year] DOA: 06/25/2014 PCP: [name], MD   Brief Narrative:   [name] is an 81 y.o. male with a PMH of metastatic non-small cell lung cancer, brain metastasis status post stereotactic radiotherapy completed 11/2012, status post palliative radiation therapy to left lower lung mass completed 12/2012, status post chemotherapy (currently on maintenance chemotherapy) who was admitted 06/25/14 with hypoxia secondary to bilateral pneumonia. Hospital course has been complicated by ongoing hypoxic respiratory failure with high oxygen requirement necessitating transfer to the SDU 07/01/14. Palliative care now following patient, with plans to discharge to a hospice facility.  Assessment/Plan:   Principal Problem: Acute respiratory failure with hypoxia / multifocal pneumonia / healthcare associated pneumonia / pulmonary edema /COPD exacerbation - Currently maintaining oxygen saturations on high flow oxygen (now has a nasal concentrator). - Given Lasix 07/01/14 to address pulmonary edema. - Blood cultures are negative to date. Strep pneumonia and legionella are also negative. - Status post treatment with vancomycin and Fortaz 06/25/14-07/01/14 and Levaquin 07/01/2014-07/05/14. - Palliative medicine team called 07/01/14, requests ongoing aggressive treatment of pneumonia at this time. - Plan is to transfer to a hospice facility at discharge.  Active Problem: Metastatic non small cell lung cancer with brain mets with right pleural effusion and pericardial effusion - Status post stereotactic radiotherapy to 3 brian lesions completed in 11/2012 under Dr. Ida Rogue care. - Status post palliative radiotherapy to left lower lung mass completed 12/2012. - Status post systemic chemotherapy and now on maintenance chemotherapy, last infusion in 03/2014. - As noted above, palliative care was consulted for goals of care.  Anemia of chronic disease -  Secondary to history of malignancy. - No current indications for transfusion. Hemoglobin stable at 8.5 mg/dL.  Chronic kidney disease, stage 3 - Creatinine at baseline 2.0 (06/17/14), current creatinine improved, 1.36.  Thrombocytopenia - Secondary to history of malignancy.  - Platelet count is low although stable. Monitor for signs of bleeding.  Brain metastases - Continue Keppra and Decadron. No reports of seizures.   Severe protein calorie malnutrition - In the context of chronic illness and malignancy. - Being followed by dietitian. - Continue nutritional supplementation as patient able to tolerate.  Dyslipidemia with advanced atherosclerosis of thoracic and abdominal aorta and branch vessels - Continue statin therapy.  Steroid-induced hyperglycemia - Hyperglycemia likely related to steroid-induced. Glucose is 117-155 on morning chemistries.  - Would not treat in light of overall prognosis. Mild.  DVT Prophylaxis  - SCD's bilaterally while patient in hospital.   Code Status: DNR/DNI Family Communication: No family currently at the bedside, gave patient my business card and told him to have the RN page me if his family would like to speak with me. Disposition Plan: Residential hospice when bed available, medically stable to transfer.  IV Access:    Port-A-Cath   Procedures and diagnostic studies:   Dg Chest 2 View  06/26/2014   CLINICAL DATA:  Cough. Shortness of breath and weakness. History of right lower lobe lung cancer status post radiation and chemotherapy.  EXAM: CHEST  2 VIEW  COMPARISON:  06/25/2014  FINDINGS: Right jugular Port-A-Cath remains in place with tip overlying the cavoatrial junction. Cardiac silhouette is within normal limits for size. Thoracic aortic calcification is noted. Post treatment changes are again seen in the right hilar region with persistent masslike opacity in architectural distortion, similar to the prior study. Interstitial markings  remain coarse diffusely, not significantly changed. Patchy, slightly more  confluent opacities in both lower lungs appear slightly increased, likely in the right middle lobe and lingula. A small right pleural effusion is again seen, unchanged. No pneumothorax. No acute osseous abnormality is identified.  IMPRESSION: Similar appearance of diffuse interstitial densities, which may reflect acute bronchitis or edema, with minimally increased patchy opacities in both lung bases which may reflect superimposed pneumonia. Small right pleural effusion.   Electronically Signed   By: Logan Bores   On: 06/26/2014 09:18   Dg Elbow Complete Right  06/25/2014   CLINICAL DATA:  Acute onset of right elbow injury. Status post fall backwards, hitting window with right arm. Posterior medial right elbow hematoma. Initial encounter.  EXAM: RIGHT ELBOW - COMPLETE 3+ VIEW  COMPARISON:  None.  FINDINGS: There is no evidence of fracture or dislocation. The visualized joint spaces are preserved. No significant joint effusion is identified. The known soft tissue hematoma is not well characterized on radiograph.  IMPRESSION: No evidence of fracture or dislocation.   Electronically Signed   By: Garald Balding M.D.   On: 06/25/2014 17:40   Ct Chest W Contrast  06/11/2014   CLINICAL DATA:  Non small cell lung cancer with brain metastasis diagnosed in August 2014. Chemotherapy completed March 2016. Radiation therapy completed.  EXAM: CT CHEST, ABDOMEN, AND PELVIS WITH CONTRAST  TECHNIQUE: Multidetector CT imaging of the chest, abdomen and pelvis was performed following the standard protocol during bolus administration of intravenous contrast.  CONTRAST:  42m OMNIPAQUE IOHEXOL 300 MG/ML  SOLN  COMPARISON:  Chest CT 03/04/2014  FINDINGS: CT CHEST FINDINGS  Chest wall: Stable right-sided Port-A-Cath. No supraclavicular or axillary lymphadenopathy. The thyroid gland is grossly normal. The bony thorax is intact. No destructive bone lesions or  spinal canal compromise. Stable T10 compression deformity.  Mediastinum: The heart is normal in size. Small pericardial effusion is stable. No enlarged mediastinal or hilar lymph nodes. Small scattered nodes are stable. The aorta demonstrates stable atherosclerotic calcifications. No dissection. The esophagus is grossly normal.  Lungs/pleura: The right-sided pleural effusion is slightly smaller. Stable emphysematous changes and extensive radiation changes involving the right lower lobe paramediastinal lung. Stable right lower lobe pulmonary nodule which measures 17 x 17 mm on image number 30. It measured 17.5 x 17 mm on the prior study. More superiorly there is a stable cavitary lesion in the right infrahilar area measuring a maximum of 3.7 x 2.9 cm. It previously measured 4.7 x 2.7 cm.  Chronic interstitial lung disease and emphysema.  CT ABDOMEN AND PELVIS FINDINGS  Hepatobiliary: No focal hepatic lesions to suggest metastatic disease. The gallbladder is surgically absent. No intra or extrahepatic biliary dilatation.  Pancreas: Normal. A duodenum diverticulum is noted near the pancreatic head.  Spleen: Normal size. No focal lesions. Perisplenic collateral vessels are noted.  Adrenals/Urinary Tract: The adrenal glands and kidneys are unremarkable.  Stomach/Bowel: The stomach, duodenum, small bowel and colon are unremarkable. No inflammatory changes, mass lesions or obstructive findings. The terminal ileum is normal. The appendix is normal.  Vascular/Lymphatic: Stable advanced aortoiliac atherosclerotic calcifications. The branch vessels are patent. The major venous structures are patent.  Other: The bladder, prostate gland and seminal vesicles are unremarkable. No pelvic mass or adenopathy. No free pelvic fluid collections. No inguinal mass or adenopathy.  Musculoskeletal: No significant bony findings. Osteoporosis and lumbar degenerative changes are noted.  IMPRESSION: 1. Stable right lower lobe pulmonary lesions  as described above. 2. Stable small pericardial effusion and slight interval decrease in size of the  right pleural effusion. 3. Stable emphysematous changes and peripheral interstitial lung disease. No new pulmonary lesions. 4. No findings for metastatic disease involving the abdomen/pelvis. 5. Advanced atherosclerotic calcifications involving the thoracic and abdominal aortas and branch vessels.   Electronically Signed   By: Marijo Sanes M.D.   On: 06/11/2014 11:35   Ct Angio Chest Pe W/cm &/or Wo Cm  06/26/2014   CLINICAL DATA:  Acute onset of shortness of breath and cough. Current history of lung cancer, recently completed chemotherapy. Severe hypoxia on ambulation. Initial encounter.  EXAM: CT ANGIOGRAPHY CHEST WITH CONTRAST  TECHNIQUE: Multidetector CT imaging of the chest was performed using the standard protocol during bolus administration of intravenous contrast. Multiplanar CT image reconstructions and MIPs were obtained to evaluate the vascular anatomy.  CONTRAST:  31m OMNIPAQUE IOHEXOL 350 MG/ML SOLN  COMPARISON:  Chest radiograph performed earlier today at 8:44 a.m., and CT of the chest performed 06/11/2014  FINDINGS: There is no evidence of pulmonary embolus.  There is diffuse hazy airspace opacification noted involving much of the left lower lobe and lingula, and portions of the right middle and lower lobes, concerning for multifocal pneumonia.  The underlying cavitary mass at the right infrahilar region appears mildly increased in size from the prior study, measuring approximately 4.4 x 3.1 x 3.5 cm. An additional smaller nodule within the right lower lobe is also slightly more prominent, measuring 1.9 cm. A mildly increased small right pleural effusion is noted. Underlying extensive post radiation change is noted at the right lung.  Underlying emphysematous change is seen bilaterally. No pneumothorax is identified.  A right-sided chest port is noted ending about the cavoatrial junction. Trace  pericardial fluid remains within normal limits. A mildly prominent subcarinal node is seen, measuring 1.1 cm in short axis. No additional mediastinal lymphadenopathy is seen. The great vessels are grossly unremarkable. No axillary lymphadenopathy is seen. The thyroid gland is unremarkable in appearance.  The visualized portions of the liver and spleen are unremarkable. The patient is status post cholecystectomy, with clips noted at the gallbladder fossa.  No acute osseous abnormalities are seen. There is a chronic compression deformity involving the superior endplate of TA07  Review of the MIP images confirms the above findings.  IMPRESSION: 1. No evidence of pulmonary embolus. 2. Diffuse hazy airspace opacification noted involving much of the left lower lobe and lingula, and portions of the right middle and lower lobes, concerning for multifocal pneumonia. 3. Underlying cavitary mass at the right infrahilar region appears mildly increased in size, measuring 4.4 x 3.1 x 3.5 cm. Additional smaller nodule at the right lower lobe is also slightly more prominent, measuring 1.9 cm. This is concerning for mild interval progression of lung cancer. 4. Mildly increased small right pleural effusion noted. 5. Underlying bilateral emphysematous change seen. 6. Mildly prominent subcarinal node, measuring 1.1 cm in short axis, more prominent than on the prior study.   Electronically Signed   By: JGarald BaldingM.D.   On: 06/26/2014 19:32   Ct Abdomen Pelvis W Contrast  06/11/2014   CLINICAL DATA:  Non small cell lung cancer with brain metastasis diagnosed in August 2014. Chemotherapy completed March 2016. Radiation therapy completed.  EXAM: CT CHEST, ABDOMEN, AND PELVIS WITH CONTRAST  TECHNIQUE: Multidetector CT imaging of the chest, abdomen and pelvis was performed following the standard protocol during bolus administration of intravenous contrast.  CONTRAST:  880mOMNIPAQUE IOHEXOL 300 MG/ML  SOLN  COMPARISON:  Chest CT  03/04/2014  FINDINGS: CT  CHEST FINDINGS  Chest wall: Stable right-sided Port-A-Cath. No supraclavicular or axillary lymphadenopathy. The thyroid gland is grossly normal. The bony thorax is intact. No destructive bone lesions or spinal canal compromise. Stable T10 compression deformity.  Mediastinum: The heart is normal in size. Small pericardial effusion is stable. No enlarged mediastinal or hilar lymph nodes. Small scattered nodes are stable. The aorta demonstrates stable atherosclerotic calcifications. No dissection. The esophagus is grossly normal.  Lungs/pleura: The right-sided pleural effusion is slightly smaller. Stable emphysematous changes and extensive radiation changes involving the right lower lobe paramediastinal lung. Stable right lower lobe pulmonary nodule which measures 17 x 17 mm on image number 30. It measured 17.5 x 17 mm on the prior study. More superiorly there is a stable cavitary lesion in the right infrahilar area measuring a maximum of 3.7 x 2.9 cm. It previously measured 4.7 x 2.7 cm.  Chronic interstitial lung disease and emphysema.  CT ABDOMEN AND PELVIS FINDINGS  Hepatobiliary: No focal hepatic lesions to suggest metastatic disease. The gallbladder is surgically absent. No intra or extrahepatic biliary dilatation.  Pancreas: Normal. A duodenum diverticulum is noted near the pancreatic head.  Spleen: Normal size. No focal lesions. Perisplenic collateral vessels are noted.  Adrenals/Urinary Tract: The adrenal glands and kidneys are unremarkable.  Stomach/Bowel: The stomach, duodenum, small bowel and colon are unremarkable. No inflammatory changes, mass lesions or obstructive findings. The terminal ileum is normal. The appendix is normal.  Vascular/Lymphatic: Stable advanced aortoiliac atherosclerotic calcifications. The branch vessels are patent. The major venous structures are patent.  Other: The bladder, prostate gland and seminal vesicles are unremarkable. No pelvic mass or adenopathy.  No free pelvic fluid collections. No inguinal mass or adenopathy.  Musculoskeletal: No significant bony findings. Osteoporosis and lumbar degenerative changes are noted.  IMPRESSION: 1. Stable right lower lobe pulmonary lesions as described above. 2. Stable small pericardial effusion and slight interval decrease in size of the right pleural effusion. 3. Stable emphysematous changes and peripheral interstitial lung disease. No new pulmonary lesions. 4. No findings for metastatic disease involving the abdomen/pelvis. 5. Advanced atherosclerotic calcifications involving the thoracic and abdominal aortas and branch vessels.   Electronically Signed   By: Marijo Sanes M.D.   On: 06/11/2014 11:35   Dg Chest Port 1 View  07/01/2014   CLINICAL DATA:  Congestion congestion.  Short of breath.  EXAM: PORTABLE CHEST - 1 VIEW  COMPARISON:  06/30/2014.  FINDINGS: RIGHT IJ Port-A-Cath appears unchanged. Cardiopericardial silhouette is also unchanged, upper limits of normal for projection. There is diffuse interstitial and alveolar opacity, which appears unchanged compared to the most recent comparison radiographs. Cavitary RIGHT hilar lesion identified on prior CT is better seen than on yesterday's exam but remains subtle, measuring about 3 cm. Small RIGHT pleural effusion.  IMPRESSION: 1. Persistent had bilateral interstitial and airspace opacity, likely representing pulmonary edema and CHF. Multifocal pneumonia is considered less likely. 2. 3 cm RIGHT lower lobe cavitary lesion better seen on today's examination in the RIGHT infrahilar region. 3. Small RIGHT pleural effusion unchanged.   Electronically Signed   By: Dereck Ligas M.D.   On: 07/01/2014 10:40   Dg Chest Port 1 View  06/30/2014   CLINICAL DATA:  Healthcare associated pneumonia  EXAM: PORTABLE CHEST - 1 VIEW  COMPARISON:  June 29, 2014  FINDINGS: Port-A-Cath tip is in the superior vena cava near the cavoatrial junction, stable. No pneumothorax. There is  widespread interstitial and patchy alveolar opacity, stable on the left with slight  increase in airspace opacity in the right lower lobe. There is been some mild clearing of right mid lung atelectasis compared to 1 day prior. Heart is upper normal in size with pulmonary vascularity within normal limits. No adenopathy. No focal bone lesions identified.  IMPRESSION: Slight increase in airspace opacity in the right lower lobe. There is a combination of patchy interstitial and alveolar opacity bilaterally. Question a degree of underlying congestive heart failure. The areas of airspace opacity may represent superimposed pneumonia. Both edema and pneumonia may exist concurrently. No change in cardiac silhouette.   Electronically Signed   By: Lowella Grip III M.D.   On: 06/30/2014 07:18   Dg Chest Port 1 View  06/29/2014   CLINICAL DATA:  Healthcare associated pneumonia. Shortness of breath and hypoxia. Metastatic right lower lobe lung carcinoma. Chronic kidney disease.  EXAM: PORTABLE CHEST - 1 VIEW  COMPARISON:  06/26/2014  FINDINGS: Heart size remains stable and within normal limits. Right hilar mass remains stable. Tiny right pleural effusion is unchanged.  Diffuse bilateral pulmonary airspace disease appears increased since previous study, which may be due to diffuse pulmonary edema or infection. Right-sided power port remains in appropriate position. No pneumothorax visualized.  IMPRESSION: Mild worsening of diffuse bilateral pulmonary airspace disease, which may be due to diffuse pulmonary edema or infection.  No significant change in right hilar mass and small right pleural effusion.   Electronically Signed   By: Earle Gell M.D.   On: 06/29/2014 09:25   Dg Chest Port 1 View  06/25/2014   CLINICAL DATA:  81 year old male with shortness of breath and weakness for 1 week. History of asthma and lung cancer.  EXAM: PORTABLE CHEST - 1 VIEW  COMPARISON:  Chest x-ray 04/19/2014.  Chest CT 06/11/2014.  FINDINGS:  Post treatment related changes in the perihilar aspect of the right lung are again noted, where there is a persistent mass like opacity on today's chest x-ray, which appears slightly more prominent than prior chest x-ray from 04/19/2014. Coarse interstitial markings are noted throughout the lungs bilaterally. Mild diffuse bronchial wall thickening. Small right and trace left pleural effusions. Heart size is normal. Atherosclerosis in the thoracic aorta. Right internal jugular single-lumen porta cath with tip terminating in the superior aspect of the right atrium.  IMPRESSION: 1. Diffuse bronchial wall thickening and interstitial prominence increased compared to the prior examination. Findings may reflect an acute bronchitis, or could be related to noncardiogenic edema. 2. Post treatment related changes of prior radiation therapy in the perihilar aspect of the right lung again noted. 3. Small right and trace left pleural effusion. 4. Atherosclerosis.   Electronically Signed   By: Vinnie Langton M.D.   On: 06/25/2014 16:44     Medical Consultants:    Palliative Care  Pulmonology  Anti-Infectives:    Doxycycline 6/10 -->6/12   Vancomycin 6/12 --> 6/16  Fortaz 6/12 --> 6/16  Levaquin 6/16 --> 07/05/58  Subjective:   [name] is without any new complaints.  Denies pain.  Dyspnea improved, no significant cough.  Appetite remains good.   Objective:    Filed Vitals:   07/04/14 1200 07/04/14 1514 07/04/14 2024 07/05/14 0513  BP: 99/67 114/73 97/64 102/72  Pulse: 124 113 124 103  Temp:  97.9 F (36.6 C) 97.9 F (36.6 C) 96.9 F (36.1 C)  TempSrc:  Oral Axillary Axillary  Resp: '26 20 24 24  '$ Height:      Weight:      SpO2: 99%  93% 97% 100%    Intake/Output Summary (Last 24 hours) at 07/05/14 0720 Last data filed at 07/05/14 0516  Gross per 24 hour  Intake    670 ml  Output   1175 ml  Net   -505 ml    Exam: Gen:  NAD Cardiovascular:  Tachy, No M/R/G Respiratory:  Lungs  diminished, clear Gastrointestinal:  Abdomen soft, NT/ND, + BS Extremities:  No C/E/C   Data Reviewed:    Labs: Basic Metabolic Panel:  Recent Labs Lab 06/30/14 0445 07/02/14 0530 07/03/14 0700 07/04/14 0530 07/05/14 0610  NA 137 135 138 140 135  K 3.6 3.8 3.9 3.9 4.0  CL 104 101 102 100* 99*  CO2 '23 25 26 28 27  '$ GLUCOSE 146* 117* 103* 121* 124*  BUN 42* 44* 44* 49* 48*  CREATININE 1.61* 1.70* 1.66* 1.50* 1.36*  CALCIUM 8.5* 8.6* 8.7* 8.9 8.8*   GFR Estimated Creatinine Clearance: 42 mL/min (by C-G formula based on Cr of 1.36).  CBC:  Recent Labs Lab 06/30/14 0445 07/01/14 1241 07/02/14 0530 07/03/14 0700 07/04/14 0530  WBC 6.9 8.3 7.4 7.8 7.6  HGB 7.9* 7.4* 8.2* 8.6* 8.5*  HCT 23.4* 23.2* 23.8* 25.2* 25.2*  MCV 91.4 91.3 93.0 94.0 94.7  PLT 59* 47* 57* 72* 67*   Sepsis Labs:  Recent Labs Lab 06/29/14 0600  07/01/14 1241 07/02/14 0530 07/03/14 0700 07/04/14 0530  PROCALCITON 0.67  --   --   --   --   --   WBC  --   < > 8.3 7.4 7.8 7.6  < > = values in this interval not displayed. Microbiology Recent Results (from the past 240 hour(s))  Culture, blood (routine x 2) Call MD if unable to obtain prior to antibiotics being given     Status: None   Collection Time: 06/27/14  3:55 PM  Result Value Ref Range Status   Specimen Description BLOOD LEFT ARM  Final   Special Requests   Final    BOTTLES DRAWN AEROBIC AND ANAEROBIC [place]   Culture   Final    NO GROWTH 5 DAYS Performed at [facility]    Report Status 07/03/2014 FINAL  Final  Culture, blood (routine x 2) Call MD if unable to obtain prior to antibiotics being given     Status: None   Collection Time: 06/27/14  4:00 PM  Result Value Ref Range Status   Specimen Description BLOOD LEFT ARM  Final   Special Requests   Final    BOTTLES DRAWN AEROBIC AND ANAEROBIC  [place] BOTH BOTTLES   Culture   Final    NO GROWTH 5 DAYS Performed at [facility]    Report Status  07/03/2014 FINAL  Final  MRSA PCR Screening     Status: None   Collection Time: 07/01/14  5:15 PM  Result Value Ref Range Status   MRSA by PCR NEGATIVE NEGATIVE Final    Comment:        The GeneXpert MRSA Assay (FDA approved for NASAL specimens only), is one component of a comprehensive MRSA colonization surveillance program. It is not intended to diagnose MRSA infection nor to guide or monitor treatment for MRSA infections.      Medications:   . antiseptic oral rinse  7 mL Mouth Rinse BID  . dexamethasone  4 mg Oral BID  . feeding supplement (ENSURE ENLIVE)  237 mL Oral BID BM  . guaiFENesin  600 mg Oral BID  .  levETIRAcetam  500 mg Oral BID  . levofloxacin (LEVAQUIN) IV  750 mg Intravenous Q48H  . senna-docusate  1 tablet Oral BID  . simvastatin  10 mg Oral QHS  . sodium chloride  3 mL Intravenous Q12H   Continuous Infusions: . sodium chloride 500 mL (07/04/14 0228)    Time spent: 25 minutes.    LOS: 10 days   [facility] Pager [phone]. If unable to reach me by pager, please call my cell phone at [phone].  *Please refer to [email or web address], password [email or web address] to get updated schedule on who will round on this patient, as hospitalists switch teams weekly. If 7PM-7AM, please contact night-coverage at [email or web address], password [email or web address] for any overnight needs.  07/05/2014, 7:20 AM

## 2014-07-05 NOTE — Progress Notes (Signed)
PT Cancellation Note  Patient Details Name: [name] MRN: [id number] DOB: June 27, 1933   Cancelled Treatment:     PT attempted.  Pt declined.  "I don't think I want to move right now".  Will follow.   Madisan Bice 07/05/2014, 3:28 PM

## 2014-07-05 NOTE — Progress Notes (Addendum)
CSW received consult to make referral to [facility] [place].  CSW contacted [facility] this morning and sent clinicals. Per [facility] the facility currently does not have a bed available, but will process pt referral and notify CSW of anticipated bed availability.   CSW updated pt wife via telephone and pt wife reports that she will be at hospital today after lunch.  CSW to continue to follow.   Addendum:   CSW followed up with [facility]. Per [facility], anticipate that pt will have bed available tomorrow. CSW to follow up in AM to confirm bed availability.  CSW updated pt wife, Opal Sidles in hallway and provided support.  CSW to continue to follow to assist with pt disposition needs.   Alison Murray, MSW, [place] [facility] [phone]

## 2014-07-06 DIAGNOSIS — J811 Chronic pulmonary edema: Secondary | ICD-10-CM

## 2014-07-06 DIAGNOSIS — J449 Chronic obstructive pulmonary disease, unspecified: Secondary | ICD-10-CM

## 2014-07-06 DIAGNOSIS — D6959 Other secondary thrombocytopenia: Secondary | ICD-10-CM

## 2014-07-06 DIAGNOSIS — D630 Anemia in neoplastic disease: Secondary | ICD-10-CM

## 2014-07-06 DIAGNOSIS — J80 Acute respiratory distress syndrome: Secondary | ICD-10-CM

## 2014-07-06 DIAGNOSIS — J948 Other specified pleural conditions: Secondary | ICD-10-CM

## 2014-07-06 MED ORDER — ALPRAZOLAM 0.5 MG PO TABS: 0.5000 mg | ORAL_TABLET | Freq: Three times a day (TID) | ORAL | Status: AC | PRN

## 2014-07-06 MED ORDER — ALBUTEROL SULFATE (2.5 MG/3ML) 0.083% IN NEBU: 2.5000 mg | INHALATION_SOLUTION | RESPIRATORY_TRACT | Status: AC | PRN

## 2014-07-06 MED ORDER — SENNOSIDES-DOCUSATE SODIUM 8.6-50 MG PO TABS: 1.0000 | ORAL_TABLET | Freq: Two times a day (BID) | ORAL | Status: AC

## 2014-07-06 MED ORDER — IPRATROPIUM BROMIDE 0.02 % IN SOLN: 0.5000 mg | RESPIRATORY_TRACT | Status: AC | PRN

## 2014-07-06 MED ORDER — NYSTATIN 100000 UNIT/ML MT SUSP: 5.0000 mL | Freq: Four times a day (QID) | OROMUCOSAL | Status: AC

## 2014-07-06 MED ORDER — MAGIC MOUTHWASH W/LIDOCAINE: 5.0000 mL | Freq: Four times a day (QID) | ORAL | Status: AC | PRN

## 2014-07-06 MED ORDER — HEPARIN SOD (PORK) LOCK FLUSH 100 UNIT/ML IV SOLN
500.0000 [IU] | Freq: Once | INTRAVENOUS | Status: AC
  Administered 2014-07-06: 500 [IU] via INTRAVENOUS
  Filled 2014-07-06: qty 5

## 2014-07-06 NOTE — Clinical Social Work Placement (Addendum)
   CLINICAL SOCIAL WORK PLACEMENT  NOTE  Date:  07/06/2014  Patient Details  Name: [name] MRN: [id number] Date of Birth: 24-Apr-1933  Clinical Social Work is seeking post-discharge placement for this patient at the [place] level of care (*CSW will initial, date and re-position this form in  chart as items are completed):  Yes   Patient/family provided with [place] Work Department's list of facilities offering this level of care within the geographic area requested by the patient (or if unable, by the patient's family).  Yes   Patient/family informed of their freedom to choose among providers that offer the needed level of care, that participate in Medicare, Medicaid or managed care program needed by the patient, have an available bed and are willing to accept the patient.  Yes   Patient/family informed of [facility]'s ownership interest in [facility] and [facility], as well as of the fact that they are under no obligation to receive care at these facilities.  PASRR submitted to EDS on 06/28/14     PASRR number received on 06/28/14     Existing PASRR number confirmed on       FL2 transmitted to all facilities in geographic area requested by pt/family on 06/28/14     FL2 transmitted to all facilities within larger geographic area on       Patient informed that his/her managed care company has contracts with or will negotiate with certain facilities, including the following:        Yes   Patient/family informed of bed offers received.  Patient chooses bed at Other - please specify in the comment section below: ([facility])     Physician recommends and patient chooses bed at      Patient to be transferred to Other - please specify in the comment section below: [facility] [place]) on 07/06/14.  Patient to be transferred to facility by ambulance [facility])     Patient family notified on 07/06/14  of transfer.  Name of family member notified:  pt notified at bedside and pt wife, Opal Sidles notified via telephone     PHYSICIAN Please sign FL2, Please sign DNR     Additional Comment: Pt transitioned to comfort care and discharged to [place]     _______________________________________________ Alison Murray A, LCSW 07/06/2014, 1:27 PM

## 2014-07-06 NOTE — Care Management Note (Signed)
Case Management Note  Patient Details  Name: [name] MRN: [id number] Date of Birth: 07-Mar-1933   Expected Discharge Date:                  Expected Discharge Plan:  [place]  In-House Referral:  Clinical Social Work  Discharge planning Services  CM Consult  Post Acute Care Choice:    Choice offered to:     DME Arranged:    DME Agency:     HH Arranged:    [place] Agency:     Status of Service:  Completed, signed off  Medicare Important Message Given:  Yes Date Medicare IM Given:  07/06/14 Medicare IM give by:  Marney Doctor RN,BSN,NCM Date Additional Medicare IM Given:    Additional Medicare Important Message give by:     If discussed at [place] of Stay Meetings, dates discussed:    Additional Comments:  Lynnell Catalan, RN 07/06/2014, 11:18 AM

## 2014-07-06 NOTE — Progress Notes (Signed)
Mr. Delsol was facing discharge to [facility] in good mood. This RN called report, left port a cath accessed for pRN use there. VSS except for elevated pulse, 128 bpm.

## 2014-07-06 NOTE — Progress Notes (Signed)
Daily Progress Note   Patient Name: [name]       Date: 07/06/2014 DOB: [birth year]  Age: 81 y.o. MRN#: [id number] Attending Physician: Venetia Maxon Rama, MD Primary Care Physician: [name], MD Admit Date: 06/25/2014  Reason for Consultation/Follow-up: Establishing goals of care  Subjective:  patient resting in bed, in mild distress due to impending D/C to inpatient hospice today.  Discussed with patient, offered support.   Anticipate D/C to hospice facility today.      Length of Stay: 11 days  Current Medications: Scheduled Meds:  . antiseptic oral rinse  7 mL Mouth Rinse BID  . dexamethasone  4 mg Oral BID  . feeding supplement (ENSURE ENLIVE)  237 mL Oral BID BM  . guaiFENesin  600 mg Oral BID  . levETIRAcetam  500 mg Oral BID  . nystatin  5 mL Oral QID  . senna-docusate  1 tablet Oral BID  . simvastatin  10 mg Oral QHS  . sodium chloride  3 mL Intravenous Q12H    Continuous Infusions: . sodium chloride 500 mL (07/04/14 0228)    PRN Meds: acetaminophen **OR** acetaminophen, albuterol, ALPRAZolam, alum & mag hydroxide-simeth, HYDROcodone-acetaminophen, ipratropium, magic mouthwash w/lidocaine, ondansetron **OR** ondansetron (ZOFRAN) IV, polyethylene glycol, sodium chloride  Palliative Performance Scale: 40%     Vital Signs: BP 106/73 mmHg  Pulse 117  Temp(Src) 97.6 F (36.4 C) (Oral)  Resp 20  Ht '5\' 11"'$  (1.803 m)  Wt 66.3 kg (146 lb 2.6 oz)  BMI 20.39 kg/m2  SpO2 97% SpO2: SpO2: 97 % O2 Device: O2 Device: Oxymizer O2 Flow Rate: O2 Flow Rate (L/min): 10 L/min  Intake/output summary:   Intake/Output Summary (Last 24 hours) at 07/06/14 1044 Last data filed at 07/06/14 0957  Gross per 24 hour  Intake    320 ml  Output   2650 ml  Net  -2330 ml   LBM:   Baseline Weight: Weight: 67.3 kg (148 lb 5.9 oz) (wearing street clothes) Most recent weight: Weight: 66.3 kg (146 lb 2.6 oz)  Physical Exam:  General: elderly gentleman, awake alert NAD  still on 10L high flow Oxymizer  Scattered rhonchi S1 S2 Abdomen soft non tender No edema               Additional Data Reviewed: Recent Labs     07/04/14  0530  07/05/14  0610  WBC  7.6   --   HGB  8.5*   --   PLT  67*   --   NA  140  135  BUN  49*  48*  CREATININE  1.50*  1.36*     Problem List:  Patient Active Problem List   Diagnosis Date Noted  . Cough   . 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 Pleural effusion, right 07/03/2014  . Pericardial effusion 07/03/2014  . Steroid-induced hyperglycemia 07/03/2014  . Weakness generalized   . Hypoxia   . HCAP (healthcare-associated pneumonia) 07/02/2014  . Acute respiratory failure with hypoxia 07/02/2014  . CKD (chronic kidney disease) stage 3, GFR 30-59 ml/min 07/02/2014  . Thrombocytopenia 07/02/2014  . Dyslipidemia 07/02/2014  . Anemia of chronic disease 07/02/2014  . Lung cancer, primary, with metastasis from lung to other site   . Encounter for palliative care   . COPD exacerbation   . Convulsions/seizures 04/23/2014  . Brain metastasis 12/02/2012     Palliative Care Assessment & Plan    Code Status:  DNR  Goals of Care:  hospice consult pending, likely will go to inpatient hospice. Patient's goal is to come home after going to inpatient hospice if possible.    3. Symptom Management:     Continue PRNs such as Maalox, senokot, Xanax   4. Palliative Prophylaxis:  Stool Softener: yes.   5. Prognosis: likely weeks 5. Discharge Planning: inpatient hospice of [facility].   Care plan was discussed with  patient, case management.   Thank you for allowing the Palliative Medicine Team to assist in the care of this patient.   Time In: 0900 Time Out: 0925 Total Time 25 Prolonged Time Billed  no     Greater than 50%  of this time was spent counseling and coordinating care related to the above assessment and plan.   Loistine Chance, MD  07/06/2014, 10:44 AM    Please contact Palliative Medicine Team phone at [phone] for questions and concerns.

## 2014-07-06 NOTE — Progress Notes (Signed)
[facility]  [phone]   Patient Care Team: [name], MD as PCP - General (Family Medicine) Vernie Shanks, MD as Consulting Physician (Family Medicine) Curt Bears, MD as Consulting Physician (Oncology)  HOSPITAL PROGRESS  NOTE   HPI: This is a very pleasant 81 years old white male with metastatic non-small cell lung cancer, adenocarcinoma with brain metastasis status post stereotactic radiotherapy to the brain lesions followed by palliative radiotherapy to the left lower lobe lung mass.The patient  was undergoing systemic chemotherapy with carboplatin and Alimta status post 6 cycles with stable disease and he is currently undergoing maintenance chemotherapy with single agent Alimta status post 16 cycles. His chemotherapy was discontinued secondary to pancytopenia and intolerance. The recent CT scan of the chest, abdomen and pelvis showed stable disease. Denies fevers, chills, night sweats, vision changes, or mucositis. Denies any respiratory complaints. Denies any chest pain or palpitations. Denies lower extremity swelling. Denies nausea, heartburn or change in bowel habits. Last bowel movement on . Denies abdominal pain. Appetite is normal. Denies any dysuria. Denies abnormal skin rashes, or neuropathy. Denies any bleeding issues such as epistaxis, hematemesis, hematuria or hematochezia.   DIAGNOSIS: Stage IV ( T2b, N2, M1b) non-small cell lung cancer, adenocarcinoma with areas of squamous differentiation with negative EGFR mutation and negative ALK gene translocation, presented with left lower lobe lung mass in addition to mediastinal lymphadenopathy and brain metastases diagnosed in October of 2014  PRIOR THERAPY: 1) Stereotactic radiotherapy to 3 brain lesions under the care of Dr. Lisbeth Renshaw completed 12/08/2012. 2) Palliative radiotherapy to the left lower lobe lung mass under the care of Dr. Lisbeth Renshaw expected to be completed 01/01/2013. 3) Systemic  chemotherapy with carboplatin for AUC of 5 and Alimta 500 mg/M2 every 3 weeks. First dose 12/23/2012. Status post 6 cycles, last 04/13/14  CURRENT THERAPY: Maintenance chemotherapy with single agent Alimta at 500 mg per meter squared given every 3 weeks. First cycle expected to be given 05/12/2013. Status post 16 cycles discontinued secondary to pancytopenia and intolerance.   MEDICATIONS: Scheduled Meds: . antiseptic oral rinse  7 mL Mouth Rinse BID  . dexamethasone  4 mg Oral BID  . feeding supplement (ENSURE ENLIVE)  237 mL Oral BID BM  . guaiFENesin  600 mg Oral BID  . levETIRAcetam  500 mg Oral BID  . nystatin  5 mL Oral QID  . senna-docusate  1 tablet Oral BID  . simvastatin  10 mg Oral QHS  . sodium chloride  3 mL Intravenous Q12H   Continuous Infusions: . sodium chloride 500 mL (07/04/14 0228)   PRN Meds:.acetaminophen **OR** acetaminophen, albuterol, ALPRAZolam, alum & mag hydroxide-simeth, HYDROcodone-acetaminophen, ipratropium, magic mouthwash w/lidocaine, ondansetron **OR** ondansetron (ZOFRAN) IV, polyethylene glycol, sodium chloride   ALLERGIES:   Allergies  Allergen Reactions  . Asa [Aspirin] Nausea Only  . Sulfa Antibiotics Nausea And Vomiting     PHYSICAL EXAMINATION:  Filed Vitals:   07/06/14 0531  BP: 106/73  Pulse: 117  Temp: 97.6 F (36.4 C)  Resp: 20   Filed Weights   06/30/14 0445 07/01/14 1715 07/03/14 0400  Weight: 138 lb 7.2 oz (62.8 kg) 147 lb 7.8 oz (66.9 kg) 146 lb 2.6 oz (66.3 kg)    GENERAL:alert, no distress and comfortable SKIN: skin color, texture, turgor are normal, no rashes or significant lesions EYES: normal, conjunctiva are pink and non-injected, sclera clear OROPHARYNX:no exudate, no erythema and lips, buccal mucosa, and tongue normal  NECK: supple, thyroid normal size,  non-tender, without nodularity LYMPH:  no palpable lymphadenopathy in the cervical, axillary or inguinal LUNGS: clear to auscultation and percussion with normal  breathing effort HEART: regular rate & rhythm and no murmurs and no lower extremity edema. Right port normal ABDOMEN:abdomen soft, non-tender and normal bowel sounds Musculoskeletal:no cyanosis of digits and no clubbing  PSYCH: alert & oriented x 3 with fluent speech NEURO: no focal motor/sensory deficits   LABORATORY/RADIOLOGY DATA:   Recent Labs Lab 06/30/14 0445 07/27/14 1241 07/02/14 0530 07/03/14 0700 07/04/14 0530  WBC 6.9 8.3 7.4 7.8 7.6  HGB 7.9* 7.4* 8.2* 8.6* 8.5*  HCT 23.4* 23.2* 23.8* 25.2* 25.2*  PLT 59* 47* 57* 72* 67*  MCV 91.4 91.3 93.0 94.0 94.7  MCH 30.9 29.1 32.0 32.1 32.0  MCHC 33.8 31.9 34.5 34.1 33.7  RDW 20.9* 17.0* 21.0* 21.0* 20.9*    CMP    Recent Labs Lab 06/30/14 0445 07/02/14 0530 07/03/14 0700 07/04/14 0530 07/05/14 0610  NA 137 135 138 140 135  K 3.6 3.8 3.9 3.9 4.0  CL 104 101 102 100* 99*  CO2 _0 GLUCOSE 146* 117* 103* 121* 124*  BUN 42* 44* 44* 49* 48*  CREATININE 1.61* 1.70* 1.66* 1.50* 1.36*  CALCIUM 8.5* 8.6* 8.7* 8.9 8.8*        Component Value Date/Time   BILITOT 1.5* 06/26/2014 0300   BILITOT 0.68 06/17/2014 1111   BILIDIR 0.4* 07/02/2007 1140       Urinalysis    Component Value Date/Time   COLORURINE YELLOW 06/25/2014 2306   APPEARANCEUR CLEAR 06/25/2014 2306   LABSPEC 1.008 06/25/2014 2306   PHURINE 6.0 06/25/2014 2306   GLUCOSEU NEGATIVE 06/25/2014 2306   HGBUR NEGATIVE 06/25/2014 2306   BILIRUBINUR NEGATIVE 06/25/2014 2306   BILIRUBINUR neg 10/28/2012 1146   KETONESUR NEGATIVE 06/25/2014 2306   PROTEINUR NEGATIVE 06/25/2014 2306   PROTEINUR neg 10/28/2012 1146   UROBILINOGEN 1.0 06/25/2014 2306   UROBILINOGEN negative 10/28/2012 1146   NITRITE NEGATIVE 06/25/2014 2306   NITRITE neg 10/28/2012 1146   LEUKOCYTESUR NEGATIVE 06/25/2014 2306    Radiology Studies:   Dg Chest Port 1 View  07-27-14   CLINICAL DATA:  Congestion congestion.  Short of breath.  EXAM: PORTABLE CHEST - 1  VIEW  COMPARISON:  06/30/2014.  FINDINGS: RIGHT IJ Port-A-Cath appears unchanged. Cardiopericardial silhouette is also unchanged, upper limits of normal for projection. There is diffuse interstitial and alveolar opacity, which appears unchanged compared to the most recent comparison radiographs. Cavitary RIGHT hilar lesion identified on prior CT is better seen than on yesterday's exam but remains subtle, measuring about 3 cm. Small RIGHT pleural effusion.  IMPRESSION: 1. Persistent had bilateral interstitial and airspace opacity, likely representing pulmonary edema and CHF. Multifocal pneumonia is considered less likely. 2. 3 cm RIGHT lower lobe cavitary lesion better seen on today's examination in the RIGHT infrahilar region. 3. Small RIGHT pleural effusion unchanged.   Electronically Signed   By: Dereck Ligas M.D.   On: 07-27-14 10:40      ASSESSMENT AND PLAN:   This is a very pleasant 81 years old white male with  Metastatic non-small cell lung cancer, adenocarcinoma with brain metastasis  status post stereotactic radiotherapy to the brain lesions followed by palliative radiotherapy to the left lower lobe lung mass. The patient was undergoing systemic chemotherapy with carboplatin and Alimta status post 6 cycles with stable disease and he is currently undergoing maintenance chemotherapy with single agent Alimta status post 16  cycles, last on 04/13/14. His chemotherapy was discontinued secondary to pancytopenia and intolerance The recent CT scan of the chest, abdomen and pelvis showed stable disease. For the metastatic brain lesion he has been evaluated by Dr. Lisbeth Renshaw as inpatient due to current hospitalization, and is to be seen upon discharge for plans for radiation to the area  Acute respiratory failure Multifocal pneumonia/Pulmonary edema /COPD exacerbation Blood cultures and respiratory negative to date COntinue supportive therapy. Appreciate primary team's follow up  Anemia in neoplastic  disease and chronic disease No bleeding issues are reported No transfusion is indicated at this time Monitor counts closely  Thrombocytopenia This is due to malignancy No bleeding issues are noted Monitor counts closely No transfusion is indicated at this time   DVT prophylaxis On SCDs   DNR  Other medical issues as per admitting team   [facility] E, PA-C 07/06/2014, 9:46 AM  ADDENDUM: Hematology/Oncology Attending: The patient is seen and examined today. I agree with the above note. This is a very pleasant 81 years old white male with metastatic non-small cell lung cancer diagnosed in October 2014 status post several treatment regimen including stereotactic radiotherapy to metastatic brain lesion in addition to induction chemotherapy with carboplatin and Alimta followed by maintenance treatment with Alimta which was discontinued secondary to pancytopenia. The patient has been on observation for the last few months but unfortunately he continues to have significant dyspnea and was admitted with acute respiratory failure secondary to multifocal pneumonia as well as COPD axis or patient and pulmonary edema. His condition has declined significantly recently. The patient and his family agreed to hospice care and he is expected to be transferred to a hospice facility later today. I agree with the current plan. I recommended for the patient to call my office if he has any concerning issues during his stay at the hospice facility. Thank you so much for taking good care of Mr. Delker.

## 2014-07-06 NOTE — Discharge Summary (Signed)
Physician Discharge Summary  [name] PJA:[id number] DOB: 09-02-1933 DOA: 06/25/2014  PCP: [name], MD  Admit date: 06/25/2014 Discharge date: 07/06/2014   Recommendations for Outpatient Follow-Up:   1. The patient is being discharged to a hospice medical facility and [facility]. 2. Continue nasal concentrator oxygen flow rate of 10 L/m.   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  Discharge Condition: Terminal.  Diet recommendation:  Regular.   History of Present Illness:   [name] is an 81 y.o. male with a PMH of metastatic non-small cell lung cancer, brain metastasis status post stereotactic radiotherapy completed 11/2012, status post palliative radiation therapy to left lower lung mass completed 12/2012, status post chemotherapy (currently on maintenance chemotherapy) who was admitted 06/25/14 with hypoxia secondary to bilateral pneumonia. Hospital course has been complicated by ongoing hypoxic respiratory failure with high oxygen requirement necessitating transfer to the SDU 07/01/14. Palliative care now following patient, with plans to discharge to  a hospice facility.  Hospital Course by Problem:   Principal Problem: Acute respiratory failure with hypoxia / multifocal pneumonia / healthcare associated pneumonia / pulmonary edema /COPD exacerbation - Currently maintaining oxygen saturations on high flow oxygen (now has a nasal concentrator). - Given Lasix 07/01/14 to address pulmonary edema. - Blood cultures were negative. Strep pneumonia and legionella are also negative. - Status post treatment with vancomycin and Fortaz 06/25/14-07/01/14 and Levaquin 07/01/2014-07/05/14. - Palliative care saw the patient and help clarify goals of care. - Plan is to transfer to a hospice facility at discharge.  Active Problem: Metastatic non small cell lung cancer with brain mets with right pleural effusion and pericardial effusion - Status post stereotactic radiotherapy to 3 brian lesions completed in 11/2012 under Dr. Ida Rogue care. - Status post palliative radiotherapy to left lower lung mass completed 12/2012. - Status post systemic chemotherapy and now on maintenance chemotherapy, last infusion in 03/2014. - As noted above, palliative care was consulted for goals of care.  Anemia of chronic disease - Secondary to history of malignancy. - No current indications for transfusion. Hemoglobin stable at 8.5 mg/dL.  Chronic kidney disease, stage 3 - Creatinine at baseline 2.0 (06/17/14), current creatinine improved, 1.36.  Thrombocytopenia - Secondary to history of malignancy.  - Platelet count is low although stable. Monitor for signs of bleeding.  Brain metastases - Continue Keppra and Decadron. No reports of seizures.   Severe protein calorie malnutrition - In the context of chronic illness and malignancy. - Was seen and evaluated by dietitian. - Continue nutritional supplementation as patient able to tolerate.  Dyslipidemia with advanced atherosclerosis of thoracic and abdominal aorta and branch vessels -  Statin therapy discontinued in  light of overall prognosis.  Steroid-induced hyperglycemia - Hyperglycemia likely related to steroid-induced. Glucose is 117-155 on morning chemistries.  - Would not treat in light of overall prognosis. Mild.    Medical Consultants:    Palliative Care  Pulmonology   Discharge Exam:   Filed Vitals:   07/06/14 0531  BP: 106/73  Pulse: 117  Temp: 97.6 F (36.4 C)  Resp: 20   Filed Vitals:   07/05/14 1348 07/05/14 2133 07/05/14 2245 07/06/14 0531  BP: 115/67  113/73 106/73  Pulse: 109  120 117  Temp: 97.7 F (36.5 C)  97.6 F (36.4 C) 97.6 F (36.4 C)  TempSrc: Oral  Oral Oral  Resp: '20  20 20  '$ Height:      Weight:      SpO2: 99% 91% 96% 97%    Gen:  NAD Cardiovascular:  Tachycardic, No M/R/G Respiratory: Lungs diminished but clear Gastrointestinal: Abdomen soft, NT/ND with normal active bowel sounds. Extremities: No C/E/C   The results of significant diagnostics from this hospitalization (including imaging, microbiology, ancillary and laboratory) are listed below for reference.     Procedures and Diagnostic Studies:   Dg Chest 2 View  06/26/2014   CLINICAL DATA:  Cough. Shortness of breath and weakness. History of right lower lobe lung cancer status post radiation and chemotherapy.  EXAM: CHEST  2 VIEW  COMPARISON:  06/25/2014  FINDINGS: Right jugular Port-A-Cath remains in place with tip overlying the cavoatrial junction. Cardiac silhouette is within normal limits for size. Thoracic aortic calcification is noted. Post treatment changes are again seen in the right hilar region with persistent masslike opacity in architectural distortion, similar to the prior study. Interstitial markings remain coarse diffusely, not significantly changed. Patchy, slightly more confluent opacities in both lower lungs appear slightly increased, likely in the right middle lobe and lingula. A small right pleural effusion is again seen, unchanged. No pneumothorax. No acute osseous  abnormality is identified.  IMPRESSION: Similar appearance of diffuse interstitial densities, which may reflect acute bronchitis or edema, with minimally increased patchy opacities in both lung bases which may reflect superimposed pneumonia. Small right pleural effusion.   Electronically Signed   By: Logan Bores   On: 06/26/2014 09:18   Dg Elbow Complete Right  06/25/2014   CLINICAL DATA:  Acute onset of right elbow injury. Status post fall backwards, hitting window with right arm. Posterior medial right elbow hematoma. Initial encounter.  EXAM: RIGHT ELBOW - COMPLETE 3+ VIEW  COMPARISON:  None.  FINDINGS: There is no evidence of fracture or dislocation. The visualized joint spaces are preserved. No significant joint effusion is identified. The known soft tissue hematoma is not well characterized on radiograph.  IMPRESSION: No evidence of fracture or dislocation.   Electronically Signed   By: Garald Balding M.D.   On: 06/25/2014 17:40   Ct Chest W Contrast  06/11/2014   CLINICAL DATA:  Non small cell lung cancer with brain metastasis diagnosed in August 2014. Chemotherapy completed March 2016. Radiation therapy completed.  EXAM: CT CHEST, ABDOMEN, AND PELVIS WITH CONTRAST  TECHNIQUE: Multidetector CT imaging of the chest, abdomen and pelvis was performed following the standard protocol during bolus administration of intravenous contrast.  CONTRAST:  71m OMNIPAQUE IOHEXOL 300 MG/ML  SOLN  COMPARISON:  Chest CT 03/04/2014  FINDINGS: CT CHEST FINDINGS  Chest wall: Stable right-sided Port-A-Cath. No supraclavicular or axillary lymphadenopathy. The thyroid gland is grossly normal. The bony thorax is intact. No destructive  bone lesions or spinal canal compromise. Stable T10 compression deformity.  Mediastinum: The heart is normal in size. Small pericardial effusion is stable. No enlarged mediastinal or hilar lymph nodes. Small scattered nodes are stable. The aorta demonstrates stable atherosclerotic  calcifications. No dissection. The esophagus is grossly normal.  Lungs/pleura: The right-sided pleural effusion is slightly smaller. Stable emphysematous changes and extensive radiation changes involving the right lower lobe paramediastinal lung. Stable right lower lobe pulmonary nodule which measures 17 x 17 mm on image number 30. It measured 17.5 x 17 mm on the prior study. More superiorly there is a stable cavitary lesion in the right infrahilar area measuring a maximum of 3.7 x 2.9 cm. It previously measured 4.7 x 2.7 cm.  Chronic interstitial lung disease and emphysema.  CT ABDOMEN AND PELVIS FINDINGS  Hepatobiliary: No focal hepatic lesions to suggest metastatic disease. The gallbladder is surgically absent. No intra or extrahepatic biliary dilatation.  Pancreas: Normal. A duodenum diverticulum is noted near the pancreatic head.  Spleen: Normal size. No focal lesions. Perisplenic collateral vessels are noted.  Adrenals/Urinary Tract: The adrenal glands and kidneys are unremarkable.  Stomach/Bowel: The stomach, duodenum, small bowel and colon are unremarkable. No inflammatory changes, mass lesions or obstructive findings. The terminal ileum is normal. The appendix is normal.  Vascular/Lymphatic: Stable advanced aortoiliac atherosclerotic calcifications. The branch vessels are patent. The major venous structures are patent.  Other: The bladder, prostate gland and seminal vesicles are unremarkable. No pelvic mass or adenopathy. No free pelvic fluid collections. No inguinal mass or adenopathy.  Musculoskeletal: No significant bony findings. Osteoporosis and lumbar degenerative changes are noted.  IMPRESSION: 1. Stable right lower lobe pulmonary lesions as described above. 2. Stable small pericardial effusion and slight interval decrease in size of the right pleural effusion. 3. Stable emphysematous changes and peripheral interstitial lung disease. No new pulmonary lesions. 4. No findings for metastatic disease  involving the abdomen/pelvis. 5. Advanced atherosclerotic calcifications involving the thoracic and abdominal aortas and branch vessels.   Electronically Signed   By: Marijo Sanes M.D.   On: 06/11/2014 11:35   Ct Angio Chest Pe W/cm &/or Wo Cm  06/26/2014   CLINICAL DATA:  Acute onset of shortness of breath and cough. Current history of lung cancer, recently completed chemotherapy. Severe hypoxia on ambulation. Initial encounter.  EXAM: CT ANGIOGRAPHY CHEST WITH CONTRAST  TECHNIQUE: Multidetector CT imaging of the chest was performed using the standard protocol during bolus administration of intravenous contrast. Multiplanar CT image reconstructions and MIPs were obtained to evaluate the vascular anatomy.  CONTRAST:  22m OMNIPAQUE IOHEXOL 350 MG/ML SOLN  COMPARISON:  Chest radiograph performed earlier today at 8:44 a.m., and CT of the chest performed 06/11/2014  FINDINGS: There is no evidence of pulmonary embolus.  There is diffuse hazy airspace opacification noted involving much of the left lower lobe and lingula, and portions of the right middle and lower lobes, concerning for multifocal pneumonia.  The underlying cavitary mass at the right infrahilar region appears mildly increased in size from the prior study, measuring approximately 4.4 x 3.1 x 3.5 cm. An additional smaller nodule within the right lower lobe is also slightly more prominent, measuring 1.9 cm. A mildly increased small right pleural effusion is noted. Underlying extensive post radiation change is noted at the right lung.  Underlying emphysematous change is seen bilaterally. No pneumothorax is identified.  A right-sided chest port is noted ending about the cavoatrial junction. Trace pericardial fluid remains within normal limits. A mildly  prominent subcarinal node is seen, measuring 1.1 cm in short axis. No additional mediastinal lymphadenopathy is seen. The great vessels are grossly unremarkable. No axillary lymphadenopathy is seen. The  thyroid gland is unremarkable in appearance.  The visualized portions of the liver and spleen are unremarkable. The patient is status post cholecystectomy, with clips noted at the gallbladder fossa.  No acute osseous abnormalities are seen. There is a chronic compression deformity involving the superior endplate of Q76.  Review of the MIP images confirms the above findings.  IMPRESSION: 1. No evidence of pulmonary embolus. 2. Diffuse hazy airspace opacification noted involving much of the left lower lobe and lingula, and portions of the right middle and lower lobes, concerning for multifocal pneumonia. 3. Underlying cavitary mass at the right infrahilar region appears mildly increased in size, measuring 4.4 x 3.1 x 3.5 cm. Additional smaller nodule at the right lower lobe is also slightly more prominent, measuring 1.9 cm. This is concerning for mild interval progression of lung cancer. 4. Mildly increased small right pleural effusion noted. 5. Underlying bilateral emphysematous change seen. 6. Mildly prominent subcarinal node, measuring 1.1 cm in short axis, more prominent than on the prior study.   Electronically Signed   By: Garald Balding M.D.   On: 06/26/2014 19:32   Ct Abdomen Pelvis W Contrast  06/11/2014   CLINICAL DATA:  Non small cell lung cancer with brain metastasis diagnosed in August 2014. Chemotherapy completed March 2016. Radiation therapy completed.  EXAM: CT CHEST, ABDOMEN, AND PELVIS WITH CONTRAST  TECHNIQUE: Multidetector CT imaging of the chest, abdomen and pelvis was performed following the standard protocol during bolus administration of intravenous contrast.  CONTRAST:  31m OMNIPAQUE IOHEXOL 300 MG/ML  SOLN  COMPARISON:  Chest CT 03/04/2014  FINDINGS: CT CHEST FINDINGS  Chest wall: Stable right-sided Port-A-Cath. No supraclavicular or axillary lymphadenopathy. The thyroid gland is grossly normal. The bony thorax is intact. No destructive bone lesions or spinal canal compromise. Stable T10  compression deformity.  Mediastinum: The heart is normal in size. Small pericardial effusion is stable. No enlarged mediastinal or hilar lymph nodes. Small scattered nodes are stable. The aorta demonstrates stable atherosclerotic calcifications. No dissection. The esophagus is grossly normal.  Lungs/pleura: The right-sided pleural effusion is slightly smaller. Stable emphysematous changes and extensive radiation changes involving the right lower lobe paramediastinal lung. Stable right lower lobe pulmonary nodule which measures 17 x 17 mm on image number 30. It measured 17.5 x 17 mm on the prior study. More superiorly there is a stable cavitary lesion in the right infrahilar area measuring a maximum of 3.7 x 2.9 cm. It previously measured 4.7 x 2.7 cm.  Chronic interstitial lung disease and emphysema.  CT ABDOMEN AND PELVIS FINDINGS  Hepatobiliary: No focal hepatic lesions to suggest metastatic disease. The gallbladder is surgically absent. No intra or extrahepatic biliary dilatation.  Pancreas: Normal. A duodenum diverticulum is noted near the pancreatic head.  Spleen: Normal size. No focal lesions. Perisplenic collateral vessels are noted.  Adrenals/Urinary Tract: The adrenal glands and kidneys are unremarkable.  Stomach/Bowel: The stomach, duodenum, small bowel and colon are unremarkable. No inflammatory changes, mass lesions or obstructive findings. The terminal ileum is normal. The appendix is normal.  Vascular/Lymphatic: Stable advanced aortoiliac atherosclerotic calcifications. The branch vessels are patent. The major venous structures are patent.  Other: The bladder, prostate gland and seminal vesicles are unremarkable. No pelvic mass or adenopathy. No free pelvic fluid collections. No inguinal mass or adenopathy.  Musculoskeletal: No significant  bony findings. Osteoporosis and lumbar degenerative changes are noted.  IMPRESSION: 1. Stable right lower lobe pulmonary lesions as described above. 2. Stable small  pericardial effusion and slight interval decrease in size of the right pleural effusion. 3. Stable emphysematous changes and peripheral interstitial lung disease. No new pulmonary lesions. 4. No findings for metastatic disease involving the abdomen/pelvis. 5. Advanced atherosclerotic calcifications involving the thoracic and abdominal aortas and branch vessels.   Electronically Signed   By: Marijo Sanes M.D.   On: 06/11/2014 11:35   Dg Chest Port 1 View  07/01/2014   CLINICAL DATA:  Congestion congestion.  Short of breath.  EXAM: PORTABLE CHEST - 1 VIEW  COMPARISON:  06/30/2014.  FINDINGS: RIGHT IJ Port-A-Cath appears unchanged. Cardiopericardial silhouette is also unchanged, upper limits of normal for projection. There is diffuse interstitial and alveolar opacity, which appears unchanged compared to the most recent comparison radiographs. Cavitary RIGHT hilar lesion identified on prior CT is better seen than on yesterday's exam but remains subtle, measuring about 3 cm. Small RIGHT pleural effusion.  IMPRESSION: 1. Persistent had bilateral interstitial and airspace opacity, likely representing pulmonary edema and CHF. Multifocal pneumonia is considered less likely. 2. 3 cm RIGHT lower lobe cavitary lesion better seen on today's examination in the RIGHT infrahilar region. 3. Small RIGHT pleural effusion unchanged.   Electronically Signed   By: Dereck Ligas M.D.   On: 07/01/2014 10:40   Dg Chest Port 1 View  06/30/2014   CLINICAL DATA:  Healthcare associated pneumonia  EXAM: PORTABLE CHEST - 1 VIEW  COMPARISON:  June 29, 2014  FINDINGS: Port-A-Cath tip is in the superior vena cava near the cavoatrial junction, stable. No pneumothorax. There is widespread interstitial and patchy alveolar opacity, stable on the left with slight increase in airspace opacity in the right lower lobe. There is been some mild clearing of right mid lung atelectasis compared to 1 day prior. Heart is upper normal in size with  pulmonary vascularity within normal limits. No adenopathy. No focal bone lesions identified.  IMPRESSION: Slight increase in airspace opacity in the right lower lobe. There is a combination of patchy interstitial and alveolar opacity bilaterally. Question a degree of underlying congestive heart failure. The areas of airspace opacity may represent superimposed pneumonia. Both edema and pneumonia may exist concurrently. No change in cardiac silhouette.   Electronically Signed   By: Lowella Grip III M.D.   On: 06/30/2014 07:18   Dg Chest Port 1 View  06/29/2014   CLINICAL DATA:  Healthcare associated pneumonia. Shortness of breath and hypoxia. Metastatic right lower lobe lung carcinoma. Chronic kidney disease.  EXAM: PORTABLE CHEST - 1 VIEW  COMPARISON:  06/26/2014  FINDINGS: Heart size remains stable and within normal limits. Right hilar mass remains stable. Tiny right pleural effusion is unchanged.  Diffuse bilateral pulmonary airspace disease appears increased since previous study, which may be due to diffuse pulmonary edema or infection. Right-sided power port remains in appropriate position. No pneumothorax visualized.  IMPRESSION: Mild worsening of diffuse bilateral pulmonary airspace disease, which may be due to diffuse pulmonary edema or infection.  No significant change in right hilar mass and small right pleural effusion.   Electronically Signed   By: Earle Gell M.D.   On: 06/29/2014 09:25   Dg Chest Port 1 View  06/25/2014   CLINICAL DATA:  81 year old male with shortness of breath and weakness for 1 week. History of asthma and lung cancer.  EXAM: PORTABLE CHEST - 1 VIEW  COMPARISON:  Chest x-ray 04/19/2014.  Chest CT 06/11/2014.  FINDINGS: Post treatment related changes in the perihilar aspect of the right lung are again noted, where there is a persistent mass like opacity on today's chest x-ray, which appears slightly more prominent than prior chest x-ray from 04/19/2014. Coarse interstitial  markings are noted throughout the lungs bilaterally. Mild diffuse bronchial wall thickening. Small right and trace left pleural effusions. Heart size is normal. Atherosclerosis in the thoracic aorta. Right internal jugular single-lumen porta cath with tip terminating in the superior aspect of the right atrium.  IMPRESSION: 1. Diffuse bronchial wall thickening and interstitial prominence increased compared to the prior examination. Findings may reflect an acute bronchitis, or could be related to noncardiogenic edema. 2. Post treatment related changes of prior radiation therapy in the perihilar aspect of the right lung again noted. 3. Small right and trace left pleural effusion. 4. Atherosclerosis.   Electronically Signed   By: Vinnie Langton M.D.   On: 06/25/2014 16:44     Labs:   Basic Metabolic Panel:  Recent Labs Lab 06/30/14 0445 07/02/14 0530 07/03/14 0700 07/04/14 0530 07/05/14 0610  NA 137 135 138 140 135  K 3.6 3.8 3.9 3.9 4.0  CL 104 101 102 100* 99*  CO2 '23 25 26 28 27  '$ GLUCOSE 146* 117* 103* 121* 124*  BUN 42* 44* 44* 49* 48*  CREATININE 1.61* 1.70* 1.66* 1.50* 1.36*  CALCIUM 8.5* 8.6* 8.7* 8.9 8.8*   GFR Estimated Creatinine Clearance: 42 mL/min (by C-G formula based on Cr of 1.36).  CBC:  Recent Labs Lab 06/30/14 0445 07/01/14 1241 07/02/14 0530 07/03/14 0700 07/04/14 0530  WBC 6.9 8.3 7.4 7.8 7.6  HGB 7.9* 7.4* 8.2* 8.6* 8.5*  HCT 23.4* 23.2* 23.8* 25.2* 25.2*  MCV 91.4 91.3 93.0 94.0 94.7  PLT 59* 47* 57* 72* 67*   Microbiology Recent Results (from the past 240 hour(s))  Culture, blood (routine x 2) Call MD if unable to obtain prior to antibiotics being given     Status: None   Collection Time: 06/27/14  3:55 PM  Result Value Ref Range Status   Specimen Description BLOOD LEFT ARM  Final   Special Requests   Final    BOTTLES DRAWN AEROBIC AND ANAEROBIC [place] BOTH BOTTLES   Culture   Final    NO GROWTH 5 DAYS Performed at [facility]     Report Status 07/03/2014 FINAL  Final  Culture, blood (routine x 2) Call MD if unable to obtain prior to antibiotics being given     Status: None   Collection Time: 06/27/14  4:00 PM  Result Value Ref Range Status   Specimen Description BLOOD LEFT ARM  Final   Special Requests   Final    BOTTLES DRAWN AEROBIC AND ANAEROBIC  [place] BOTH BOTTLES   Culture   Final    NO GROWTH 5 DAYS Performed at [facility]    Report Status 07/03/2014 FINAL  Final  MRSA PCR Screening     Status: None   Collection Time: 07/01/14  5:15 PM  Result Value Ref Range Status   MRSA by PCR NEGATIVE NEGATIVE Final    Comment:        The GeneXpert MRSA Assay (FDA approved for NASAL specimens only), is one component of a comprehensive MRSA colonization surveillance program. It is not intended to diagnose MRSA infection nor to guide or monitor treatment for MRSA infections.      Discharge Instructions:  Discharge Instructions    Activity as tolerated - No restrictions    Complete by:  As directed      Call MD for:  persistant nausea and vomiting    Complete by:  As directed      Call MD for:  severe uncontrolled pain    Complete by:  As directed      Diet general    Complete by:  As directed             Medication List    STOP taking these medications        furosemide 20 MG tablet  Commonly known as:  LASIX     simvastatin 10 MG tablet  Commonly known as:  ZOCOR      TAKE these medications        acetaminophen 500 MG tablet  Commonly known as:  TYLENOL  Take 500 mg by mouth every 6 (six) hours as needed for mild pain.     albuterol (2.5 MG/3ML) 0.083% nebulizer solution  Commonly known as:  PROVENTIL  Take 3 mLs (2.5 mg total) by nebulization every 2 (two) hours as needed for wheezing.     ALPRAZolam 0.5 MG tablet  Commonly known as:  XANAX  Take 1 tablet (0.5 mg total) by mouth 3 (three) times daily as needed for anxiety.     clobetasol cream 0.05 %  Commonly known as:   TEMOVATE  Apply 1 application topically daily as needed (for itching).     dexamethasone 4 MG tablet  Commonly known as:  DECADRON  Take 1 tablet (4 mg total) by mouth 2 (two) times daily.     feeding supplement (ENSURE COMPLETE) Liqd  Take 237 mLs by mouth 3 (three) times daily between meals.     folic acid 509 MCG tablet  Commonly known as:  FOLVITE  Take 400 mcg by mouth every morning.     ipratropium 0.02 % nebulizer solution  Commonly known as:  ATROVENT  Take 2.5 mLs (0.5 mg total) by nebulization every 2 (two) hours as needed for wheezing or shortness of breath.     levETIRAcetam 500 MG tablet  Commonly known as:  KEPPRA  Take 1 tablet (500 mg total) by mouth 2 (two) times daily.     magic mouthwash w/lidocaine Soln  Take 5 mLs by mouth 4 (four) times daily as needed for mouth pain.     nystatin 100000 UNIT/ML suspension  Commonly known as:  MYCOSTATIN  Take 5 mLs (500,000 Units total) by mouth 4 (four) times daily.     senna-docusate 8.6-50 MG per tablet  Commonly known as:  Senokot-S  Take 1 tablet by mouth 2 (two) times daily.     SYSTANE OP  Place 1 drop into both eyes daily.           Follow-up Information    Schedule an appointment as soon as possible for a visit with [name], MD.   Specialty:  Family Medicine   Why:  As needed   Contact information:   [place] [address] [phone]        Time coordinating discharge: 35 minutes.  Signed:  RAMA,CHRISTINA  Pager [phone] [facility] 07/06/2014, 12:26 PM

## 2014-07-06 NOTE — Progress Notes (Signed)
Patient discharged to [facility] via [facility]. Wife informed of his departure per Alison Murray, LCSW

## 2014-07-06 NOTE — Progress Notes (Signed)
Pt for discharge to [facility]. CSW confirmed with facility bed available today.  CSW facilitated pt discharge needs including contacting facility, faxing pt discharge paperwork via [facility], discussing with pt at bedside and pt wife,Jane via telephone, providing RN phone number to call report, and scheduling ambulance transport for 2 pm pick up per [facility] request.   Pt eager about transition to [facility] as he was hopeful to return home, but pt care needs exceed what pt wife is able to provide for pt at home at this time. CSW provided support. Pt wife plans to meet pt at [facility] upon pt arrival.  No further social work needs identified at this time.  CSW signing off.   Alison Murray, MSW, [place] [facility] [phone]

## 2014-07-16 NOTE — H&P (Signed)
PCP: [name], MD  [facility] [place]  Referring provide [place]   Chief Complaint: Dyspnea  HPI: [name] is a 81 y.o. male   has a past medical history of COPD (chronic obstructive pulmonary disease); Allergy; radiation therapy (12/09/12-01/01/13); and nscl ca w/ brain mets (dx'd 08/2012).   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 Treatment was complicated by pancytopenia. Patient was seen by his oncologist on June 2 had recent CT scan imaging that showed stable disease. I am asked to continue monitoring him and repeat CT imaging in 3 months and to refer him back to radiation oncology for treatment of metastatic brain lesions.  The past few days he has been Tired . Has been having worsening shortness of breath. Patient has been so weak and staggering. had been having some coughing worse at night. He has "lost balance" today and hit his elbow. Denies LOC no head trauma. NO seizure activity. The bleeding from the elbow caused a stain on his shirt and family took him to ER to get that checked out knowing hx of thrombocytopenia. Pateitn endorses Dyspnea on exertion. The bit of wheezing although not significant nonproductive cough. Denies any fevers he is on baseline not on oxygen but on arrival to emergency department was noted to be hypoxic down to mid 80s requiring 2 L. Reports bilateral leg swelling which has been going on for quite some time per family of the swelling has improved. Patient supposed to be on Lasix PRN but haven't been taking it recently because his swelling has come down. No nausea vomiting diarrhea no abdominal pain no black stools no blood in stools. Hemoglobin noted to be down to 7.2 with platelet count of 68 Troponin was noted to be slightly elevated at 0.04 chest x-ray done was worrisome for  bronchitis versus noncardiogenic edema.  Patient with known history of COPD. Have had intermittent weakness and fatigue. Reports 5 days ago he had a severe headache on the top of his head he took some tylenol and it resolved. He has had intermittent trouble seeing out of right eye and double vision that comes and goes gets better with blinking and associated with dry eye, he thinks this has ben happening for months.   Of note patient had an echogram in April 2016 showing EF 55-60 percent with grade 1 diastolic dysfunction.   Emergency department patient was given a dose of Zosyn Hospitalist was called for admission for bronchitis and elevated troponin with significant anemia 70 mild CHF exacerbation  Review of Systems:   Pertinent positives include: shortness of breath at rest, dyspnea on exertion non-productive coughwheezing  Constitutional:  No weight loss, night sweats, Fevers, chills, fatigue, weight loss  HEENT:  No headaches, Difficulty swallowing,Tooth/dental problems,Sore throat,  No sneezing, itching, ear ache, nasal congestion, post nasal drip,  Cardio-vascular:  No chest pain, Orthopnea, PND, anasarca, dizziness, palpitations.no Bilateral lower extremity swelling  GI:  No heartburn, indigestion, abdominal pain, nausea, vomiting, diarrhea, change in bowel habits, loss of appetite, melena, blood in stool, hematemesis Resp:  No excess mucus, no productive cough, No , No coughing up of blood.No change in color of mucus.  Skin:  no rash or lesions. No jaundice GU:  no dysuria, change in color of urine, no urgency or frequency. No straining to urinate.  No flank pain.  Musculoskeletal:  No joint pain  or no joint swelling. No decreased range of motion. No back pain.  Psych:  No change in mood or affect. No depression or anxiety. No memory loss.  Neuro: no localizing neurological complaints, no tingling, no weakness, no double vision, no gait abnormality, no slurred  speech, no confusion  Otherwise ROS are negative except for above, 10 systems were reviewed  Past Medical History: Past Medical History  Diagnosis Date  . COPD (chronic obstructive pulmonary disease)   . Allergy     allergic rhinitis  . Hx of radiation therapy 12/09/12-01/01/13    lung 37.5Gy  . nscl ca w/ brain mets dx'd 08/2012    Patient has a lung mass which is being evaluated   Past Surgical History  Procedure Laterality Date  . Cholecystectomy       Medications: Prior to Admission medications   Medication Sig Start Date End Date Taking? Authorizing Provider  acetaminophen (TYLENOL) 500 MG tablet Take 500 mg by mouth every 6 (six) hours as needed for mild pain.    Yes Historical Provider, MD  clobetasol cream (TEMOVATE) 0.73 % Apply 1 application topically daily as needed (for itching).   Yes Historical Provider, MD  dexamethasone (DECADRON) 4 MG tablet Take 1 tablet (4 mg total) by mouth 2 (two) times daily. 06/21/14  Yes Tyler Pita, MD  feeding supplement, ENSURE COMPLETE, (ENSURE COMPLETE) LIQD Take 237 mLs by mouth 3 (three) times daily between meals. Patient taking differently: Take 237 mLs by mouth daily as needed (nutritional supplement).  03/11/14  Yes Maryann Mikhail, DO  folic acid (FOLVITE) 710 MCG tablet Take 400 mcg by mouth every morning.   Yes Historical Provider, MD  furosemide (LASIX) 20 MG tablet TAKE 1 TABLET BY MOUTH DAILY AS NEEDED FOR SWELLING OF THE LOWER EXTREMITIES. Patient taking differently: TAKE 20 MG BY MOUTH DAILY AS NEEDED FOR SWELLING OF THE LOWER EXTREMITIES. 05/09/14  Yes Curt Bears, MD  levETIRAcetam (KEPPRA) 500 MG tablet Take 1 tablet (500 mg total) by mouth 2 (two) times daily. 05/02/14  Yes Orson Eva, MD  Polyethyl Glycol-Propyl Glycol (SYSTANE OP) Place 1 drop into both eyes daily.   Yes Historical Provider, MD  simvastatin (ZOCOR) 10 MG tablet  Take 10 mg by mouth at bedtime.   Yes Historical Provider, MD    Allergies:  Allergies  Allergen Reactions  . Asa [Aspirin] Nausea Only  . Sulfa Antibiotics Nausea And Vomiting    Social History:  Ambulatory independently  Lives at home With family    reports that he quit smoking about 15 years ago. He has never used smokeless tobacco. He reports that he does not drink alcohol or use illicit drugs.    Family History: family history includes Cancer in his mother; Stroke in his father.    Physical Exam: Patient Vitals for the past 24 hrs:  BP Temp Temp src Pulse Resp SpO2  06/25/14 1742 117/68 mmHg - - 97 19 100 %  06/25/14 1740 117/68 mmHg - - 97 - 99 %  06/25/14 1715 - - - 99 - 99 %  06/25/14 1615 - - - - - 98 %  06/25/14 1548 112/64 mmHg 97.8 F (36.6 C) Oral 112 22 (!) 85 %    1. General: in No Acute distress 2. Psychological: Alert and Oriented 3. Head/ENT: Moist Mucous Membranes  Head Non traumatic, neck supple  Normal Dentition 4. SKIN: normal Skin turgor, Skin clean Dry and intact no rash 5. Heart: Regular rate and rhythm no Murmur,  Rub or gallop 6. Lungs: Minimal wheezes, some occasional crackles  7. Abdomen: Soft, non-tender, Non distended 8. Lower extremities: no clubbing, cyanosis, bilateral edema family states have improved 9. Neurologically strength 5 out of 5 in all 4 extremities cranial nerves II through XII intact neurologically intact  10. MSK: Normal range of motion  body mass index is unknown because there is no weight on file.   Labs on Admission:    Lab Results Last 24 Hours    Results for orders placed or performed during the hospital encounter of 06/25/14 (from the past 24 hour(s))  CBC with Differential/Platelet Status: Abnormal   Collection Time: 06/25/14 4:36 PM  Result Value Ref Range   WBC 6.1 4.0  - 10.5 K/uL   RBC 2.28 (L) 4.22 - 5.81 MIL/uL   Hemoglobin 7.2 (L) 13.0 - 17.0 g/dL   HCT 20.7 (L) 39.0 - 52.0 %   MCV 90.8 78.0 - 100.0 fL   MCH 31.6 26.0 - 34.0 pg   MCHC 34.8 30.0 - 36.0 g/dL   RDW 21.1 (H) 11.5 - 15.5 %   Platelets 68 (L) 150 - 400 K/uL   Neutrophils Relative % 93 (H) 43 - 77 %   Lymphocytes Relative 4 (L) 12 - 46 %   Monocytes Relative 3 3 - 12 %   Eosinophils Relative 0 0 - 5 %   Basophils Relative 0 0 - 1 %   Neutro Abs 5.7 1.7 - 7.7 K/uL   Lymphs Abs 0.2 (L) 0.7 - 4.0 K/uL   Monocytes Absolute 0.2 0.1 - 1.0 K/uL   Eosinophils Absolute 0.0 0.0 - 0.7 K/uL   Basophils Absolute 0.0 0.0 - 0.1 K/uL   RBC Morphology POLYCHROMASIA PRESENT   Troponin I Status: Abnormal   Collection Time: 06/25/14 4:36 PM  Result Value Ref Range   Troponin I 0.04 (H) <0.031 ng/mL  Brain natriuretic peptide Status: Abnormal   Collection Time: 06/25/14 4:37 PM  Result Value Ref Range   B Natriuretic Peptide 122.0 (H) 0.0 - 100.0 pg/mL      UA pending   Recent Labs    No results found for: HGBA1C    Estimated Creatinine Clearance: 30.1 mL/min (by C-G formula based on Cr of 2).  BNP (last 3 results)  Recent Labs (within last 365 days)    No results for input(s): PROBNP in the last 8760 hours.    Other results:  I have pearsonaly reviewed this: ECG REPORT  Rate: 92 Rhythm: Normal sinus rhythm ST&T Change: No ischemic changes QTC 417   There were no vitals filed for this visit.   Cultures:  Labs (Brief)       Component Value Date/Time   SDES BLOOD RIGHT ANTECUBITAL 04/19/2014 2230   SPECREQUEST BOTTLES DRAWN AEROBIC AND ANAEROBIC 5CC 04/19/2014 2230   CULT  04/19/2014 2230    NO GROWTH 5 DAYS Performed at [facility] 04/26/2014 FINAL 04/19/2014 2230      Radiological Exams on Admission:  Imaging  Results (Last 48 hours)    Dg Elbow Complete Right  06/25/2014 CLINICAL DATA: Acute onset of right elbow injury. Status post fall backwards, hitting window with right arm. Posterior medial right elbow hematoma. Initial encounter. EXAM: RIGHT ELBOW - COMPLETE 3+ VIEW COMPARISON: None. FINDINGS: There is no evidence of fracture or dislocation. The visualized joint spaces are preserved. No significant joint effusion is identified. The known soft tissue hematoma is not well characterized on radiograph. IMPRESSION:  No evidence of fracture or dislocation. Electronically Signed By: Garald Balding M.D. On: 06/25/2014 17:40   Dg Chest Port 1 View  06/25/2014 CLINICAL DATA: 81 year old male with shortness of breath and weakness for 1 week. History of asthma and lung cancer. EXAM: PORTABLE CHEST - 1 VIEW COMPARISON: Chest x-ray 04/19/2014. Chest CT 06/11/2014. FINDINGS: Post treatment related changes in the perihilar aspect of the right lung are again noted, where there is a persistent mass like opacity on today's chest x-ray, which appears slightly more prominent than prior chest x-ray from 04/19/2014. Coarse interstitial markings are noted throughout the lungs bilaterally. Mild diffuse bronchial wall thickening. Small right and trace left pleural effusions. Heart size is normal. Atherosclerosis in the thoracic aorta. Right internal jugular single-lumen porta cath with tip terminating in the superior aspect of the right atrium. IMPRESSION: 1. Diffuse bronchial wall thickening and interstitial prominence increased compared to the prior examination. Findings may reflect an acute bronchitis, or could be related to noncardiogenic edema. 2. Post treatment related changes of prior radiation therapy in the perihilar aspect of the right lung again noted. 3. Small right and trace left pleural effusion. 4. Atherosclerosis. Electronically Signed By: Vinnie Langton M.D. On: 06/25/2014 16:44      Chart has been reviewed  Family at Bedside plan of care was discussed with Wife Opal Sidles [phone], Daughter in law Patriccia  Assessment/Plan 56 year-old gentleman history of non-small cell lung cancer with metastasis to the brain followed by radiation oncology currently not on chemotherapy secondary to pancytopenia presents with worsening debility and shortness of breath and hypoxia which has been gradual in onset. Was noted to have hemoglobin that strict and down to 7.2 and slightly elevated troponin and normal EKG no chest pain.  Present on Admission:  . SOB (shortness of breath) - multifactorial patient has known history of COPD and had been having a bit of a cough. No evidence of pneumonia on chest x-ray. No evidence of sepsis. Mild COPD exacerbation as a possibility. Patient has a mild wheezes and was found to be somewhat hypoxic. Patient has known history of lung cancer. He is thrombocytopenic at baseline no reports of chest pain. Onset of symptoms was gradual therefore PE less likely. Patient has history of pericardial effusion. Will reevaluate with echo given worsening shortness of breath  . Primary cancer of right lower lobe of lung - will need to notify Dr. Earlie Server patient is here  . pancytopenia thought to be secondary to chemotherapy platelets is somewhat improved with hemoglobin drifting down  . COPD (chronic obstructive pulmonary disease) Will give albuterol as needed Atrovent scheduled Mucinex and doxycycline. No significant wheezing right now unsure if it is any underlying infection will hold off on cigarettes for right now and see how patient is doing  . CKD (chronic kidney disease) and baseline we'll continue to monitor  . Protein-calorie malnutrition, severe - we will write for nutritional Consult assess prealbumin. Patient's openness 2.5 which is contributed to chronic swelling  . Peripheral edema likely multifactorial patient assess history of diastolic heart  failure but does not appear to be fluid overloaded at this time. Albumen 2.5 platelet contributing to peripheral edema  . Symptomatic anemia - patient required multiple transfusions in the past will transfuse 1 unit and monitor how patient is doing. We'll need to discuss with oncology regarding overall prognosis Elevated troponin - minimally so we'll continue to cycle cardiac enzymes and serial EKG monitor on telemetry no chest pain no EKG changes History of metastasis  to brain continue Decadron, no significant neurological changes since prior evaluation as per patient he is supposed to follow up with radiation oncology  Prophylaxis: scd CODE STATUS: FULL CODE as per patient   Disposition: To home once workup is complete and patient is stable  Other plan as per orders.  I have spent a total of 65 min on this admission is for Marcello Moores taken to discuss plan of care with family 40 minutes of face-to-face time spent  [facility] 06/25/2014, 6:32 PM  [facility]  Pager [phone]   after 2 AM please page floor coverage [place] If 7AM-7PM, please contact the day team taking care of the patient  [email or web address]  Password [email or web address]

## 2014-07-16 DEATH — deceased

## 2014-09-16 ENCOUNTER — Other Ambulatory Visit: Payer: Medicare Other

## 2014-09-23 ENCOUNTER — Ambulatory Visit: Payer: Medicare Other | Admitting: Internal Medicine

## 2015-09-05 ENCOUNTER — Encounter: Payer: Self-pay | Admitting: Radiation Therapy

## 2016-06-10 IMAGING — CR DG CHEST 2V
2 series · 2 of 2 positions shown · non-contrast
Comparison: 03/06/2014 and CT chest 03/04/2014.

CLINICAL DATA: Shortness of breath, leg swelling, lung cancer,
initial encounter.

EXAM:
CHEST  2 VIEW

[w chest lat]
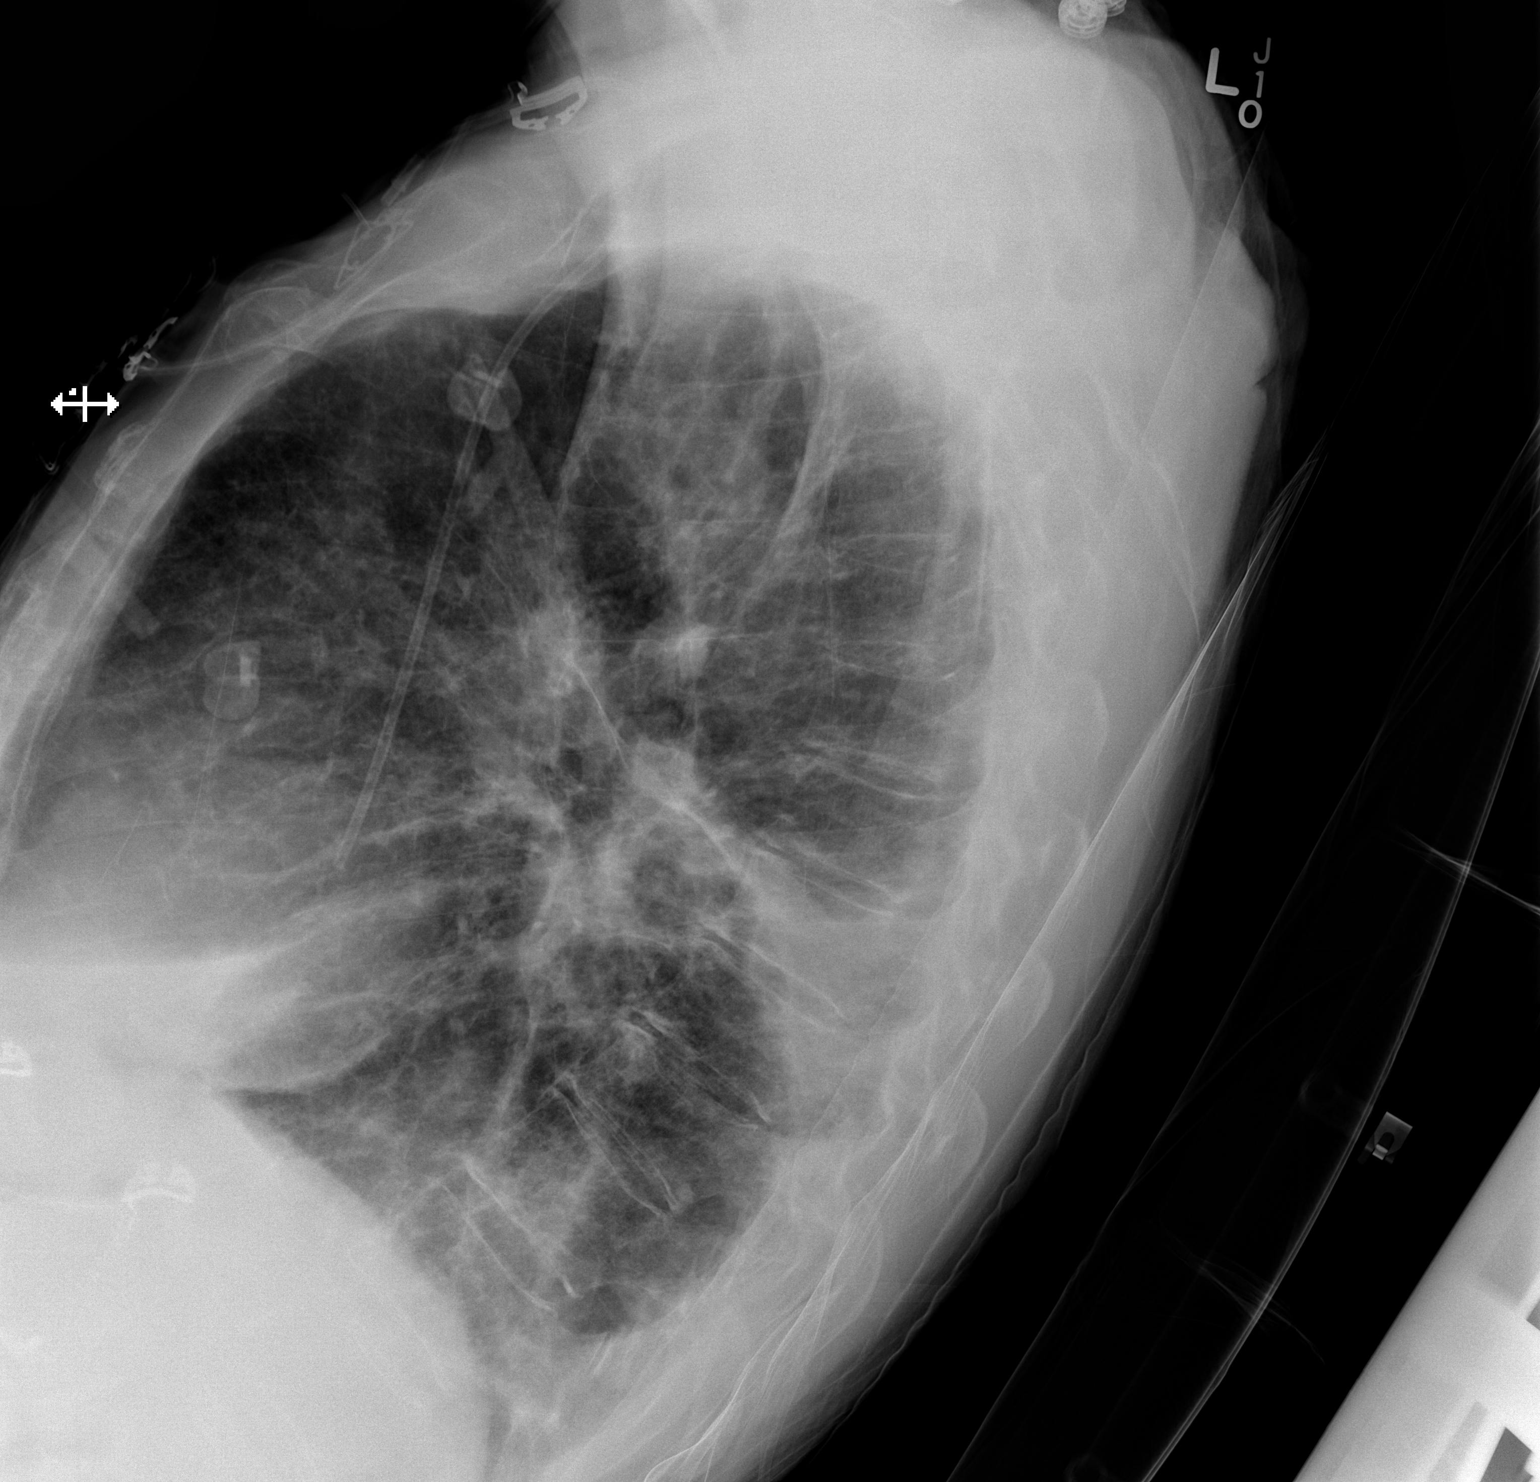

[x chest ap]
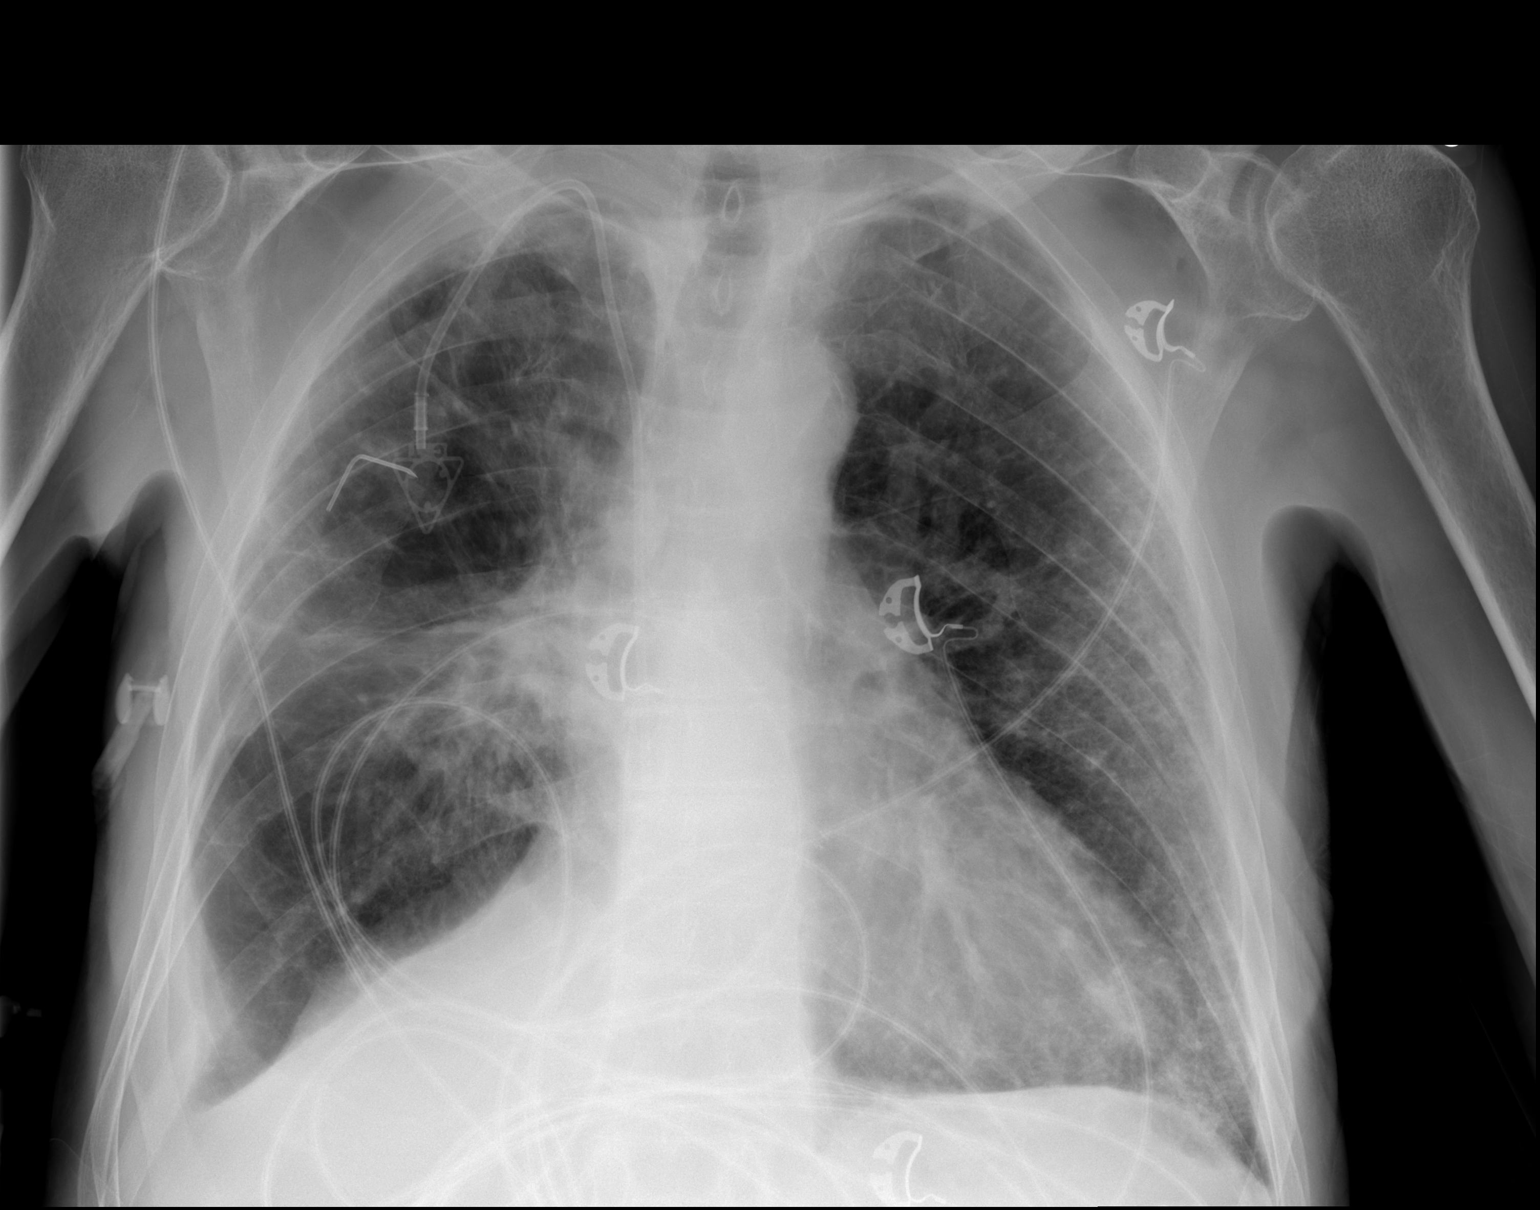

[2 of 2 positions shown; findings below may reference images not displayed]

FINDINGS: Trachea is midline. Heart size stable. Right IJ power port tip is in
the high right atrium. Right perihilar opacification and
architectural distortion appear grossly stable. Small right pleural
effusion with volume loss at the base of the right hemi thorax.
Reticular coarsening in the periphery of the left hemi thorax. No
left pleural fluid.
IMPRESSION: 1. Right perihilar masslike opacification with architectural
distortion, unchanged from 03/06/2014 and better interrogated on
cross-sectional imaging 03/04/2014.
2. Small right pleural effusion, possibly increased.
3. Subpleural reticulation, indicative of fibrosis.

## 2016-06-14 IMAGING — US US ABDOMEN LIMITED
1 series · 14 of 24 positions shown · non-contrast
Comparison: None.

CLINICAL DATA: Abnormal LFTs.

EXAM:
US ABDOMEN LIMITED - RIGHT UPPER QUADRANT

[Series 1: us abdomen limited · 0.20mm/px · 14 of 24 slices shown]
[im 1/24]
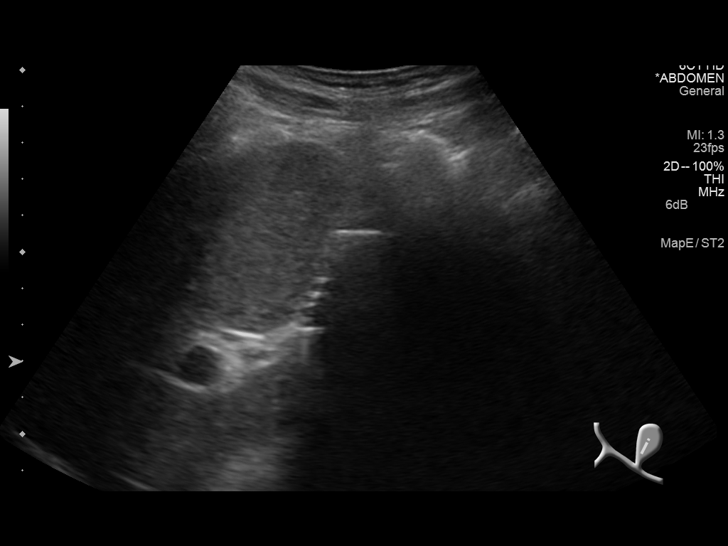
[im 3/24]
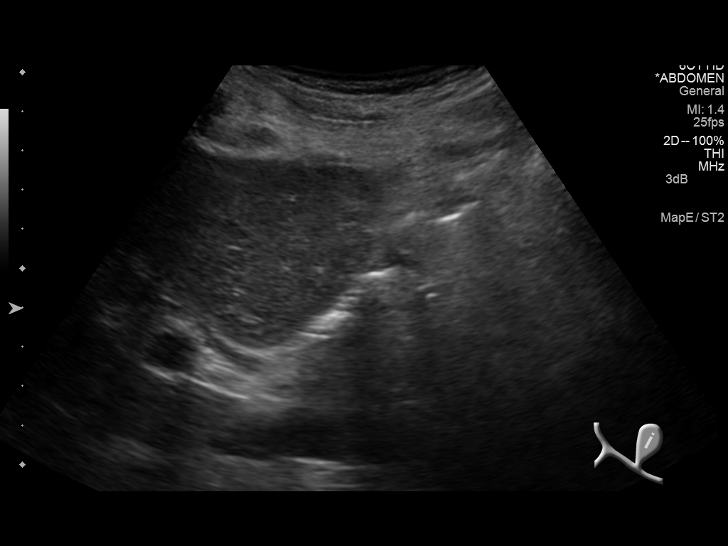
[im 5/24]
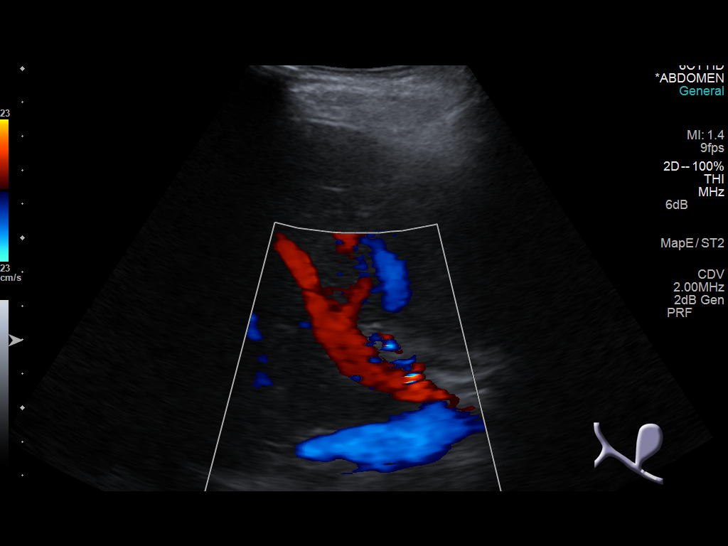
[im 7/24]
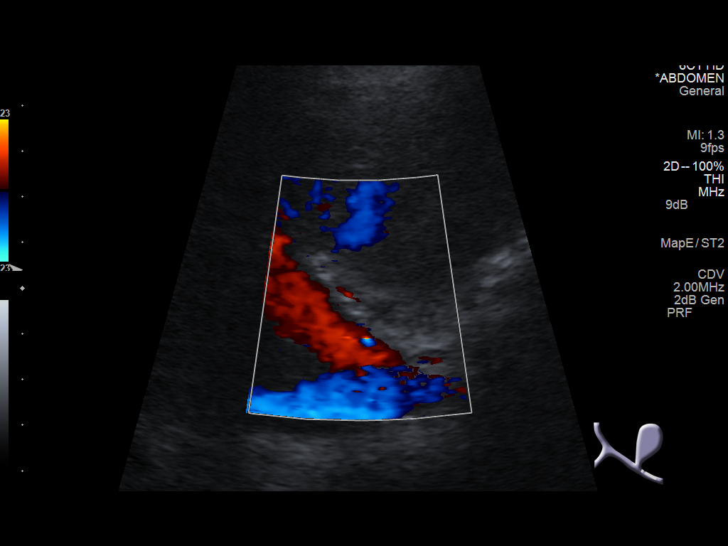
[im 8/24]
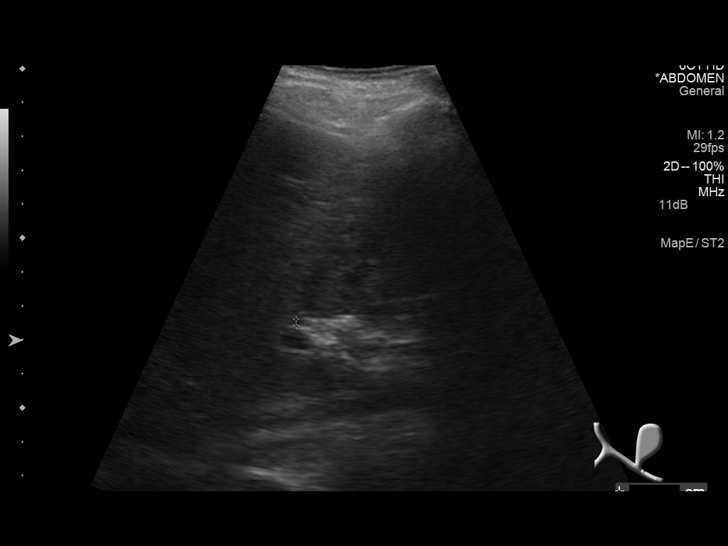
[im 10/24]
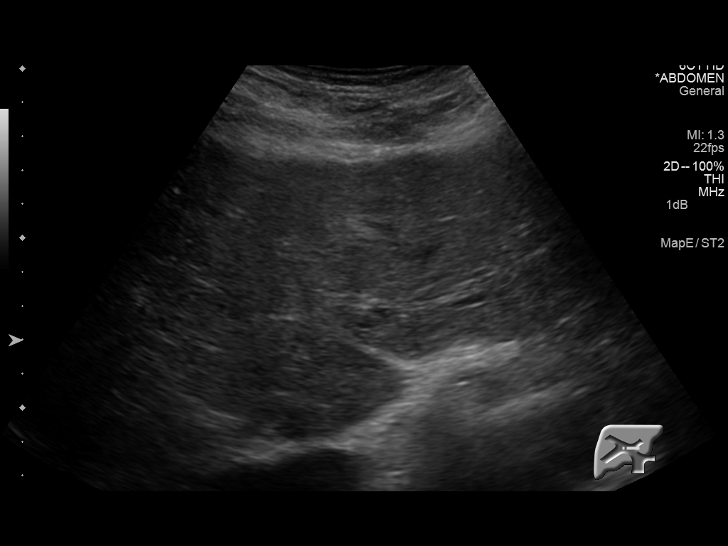
[im 12/24]
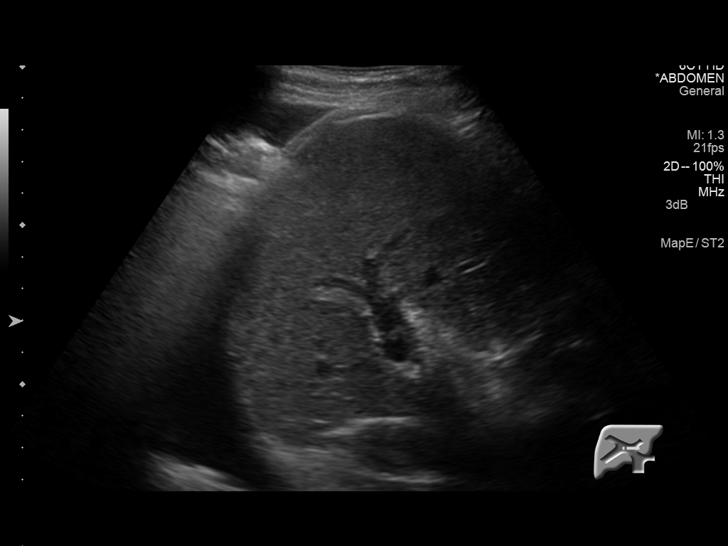
[im 13/24]
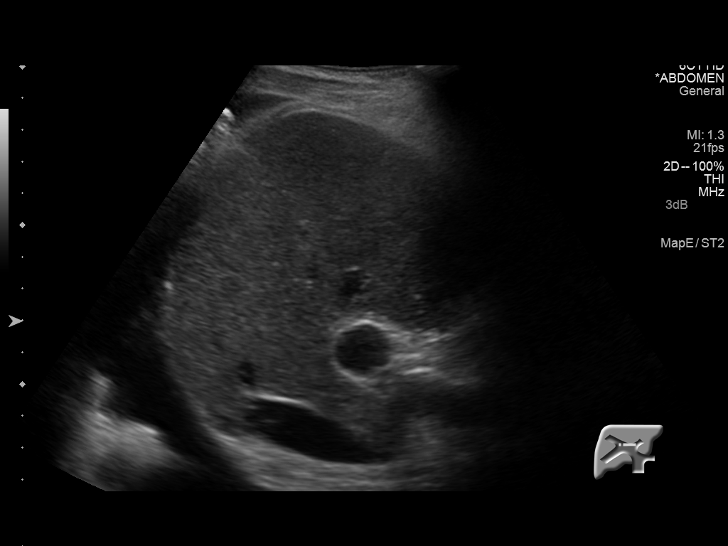
[im 15/24]
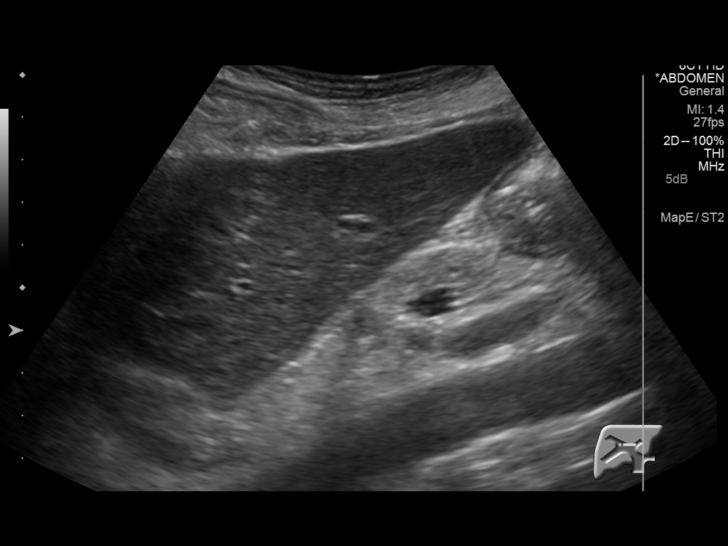
[im 17/24]
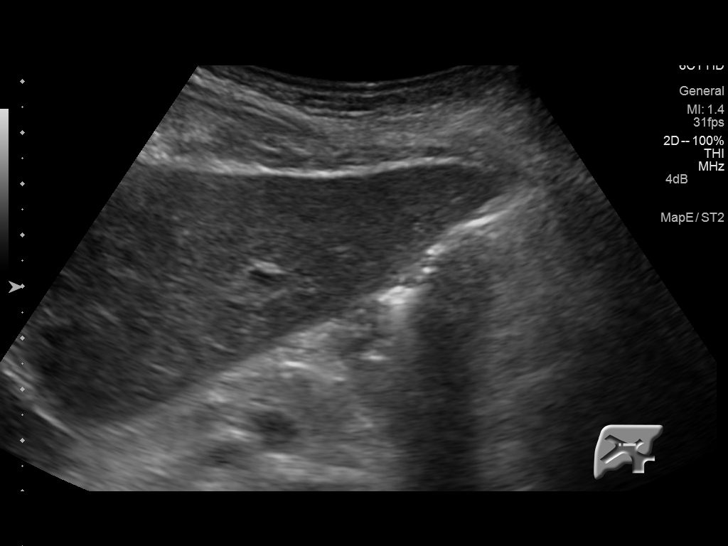
[im 19/24]
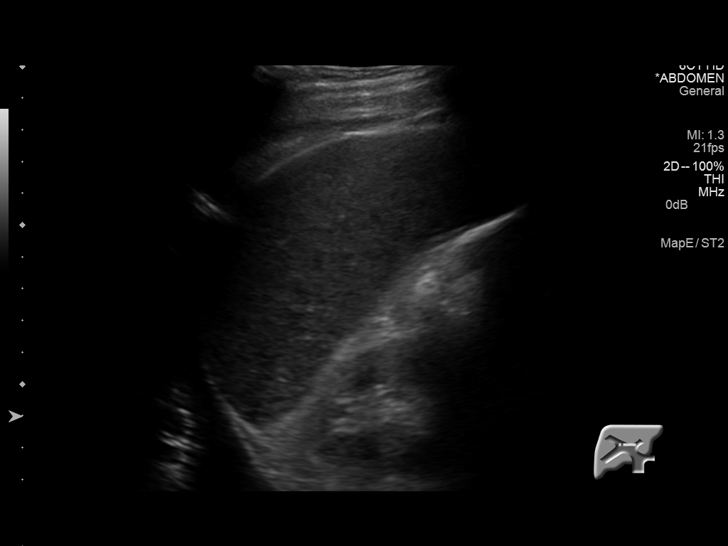
[im 20/24]
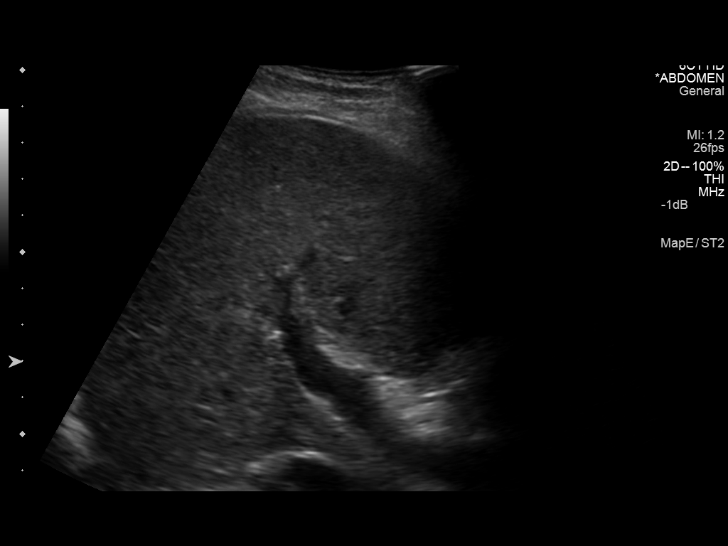
[im 22/24]
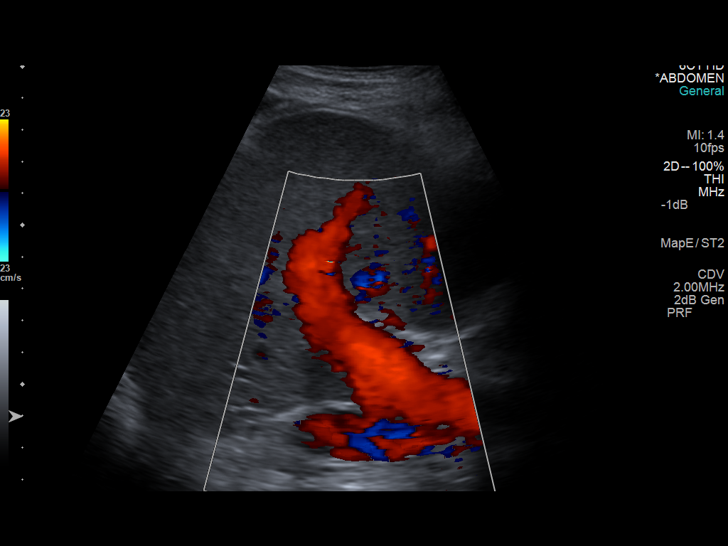
[im 24/24]
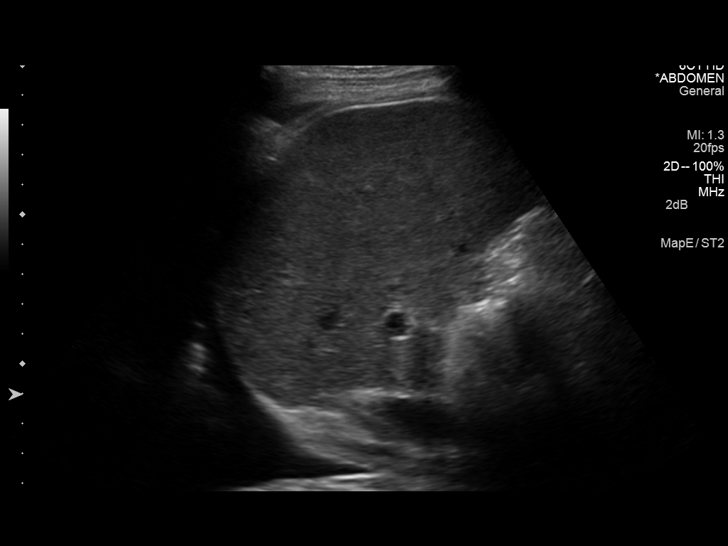

[14 of 24 positions shown; findings below may reference images not displayed]

FINDINGS: Gallbladder:

Previous cholecystectomy.

Common bile duct:

Diameter: 2 mm

Liver:

No focal lesion identified. Within normal limits in parenchymal
echogenicity.

Other:  Right pleural effusion noted.
IMPRESSION: 1. No findings to explain patient's elevated LFTs.
2. Prior cholecystectomy
3. Right pleural effusion

## 2016-08-16 IMAGING — CR DG CHEST 1V PORT
1 series · 1 of 1 positions shown · non-contrast
Comparison: Chest x-ray 04/19/2014.  Chest CT 06/11/2014.

CLINICAL DATA: 80-year-old male with shortness of breath and
weakness for 1 week. History of asthma and lung cancer.

EXAM:
PORTABLE CHEST - 1 VIEW

[AP]
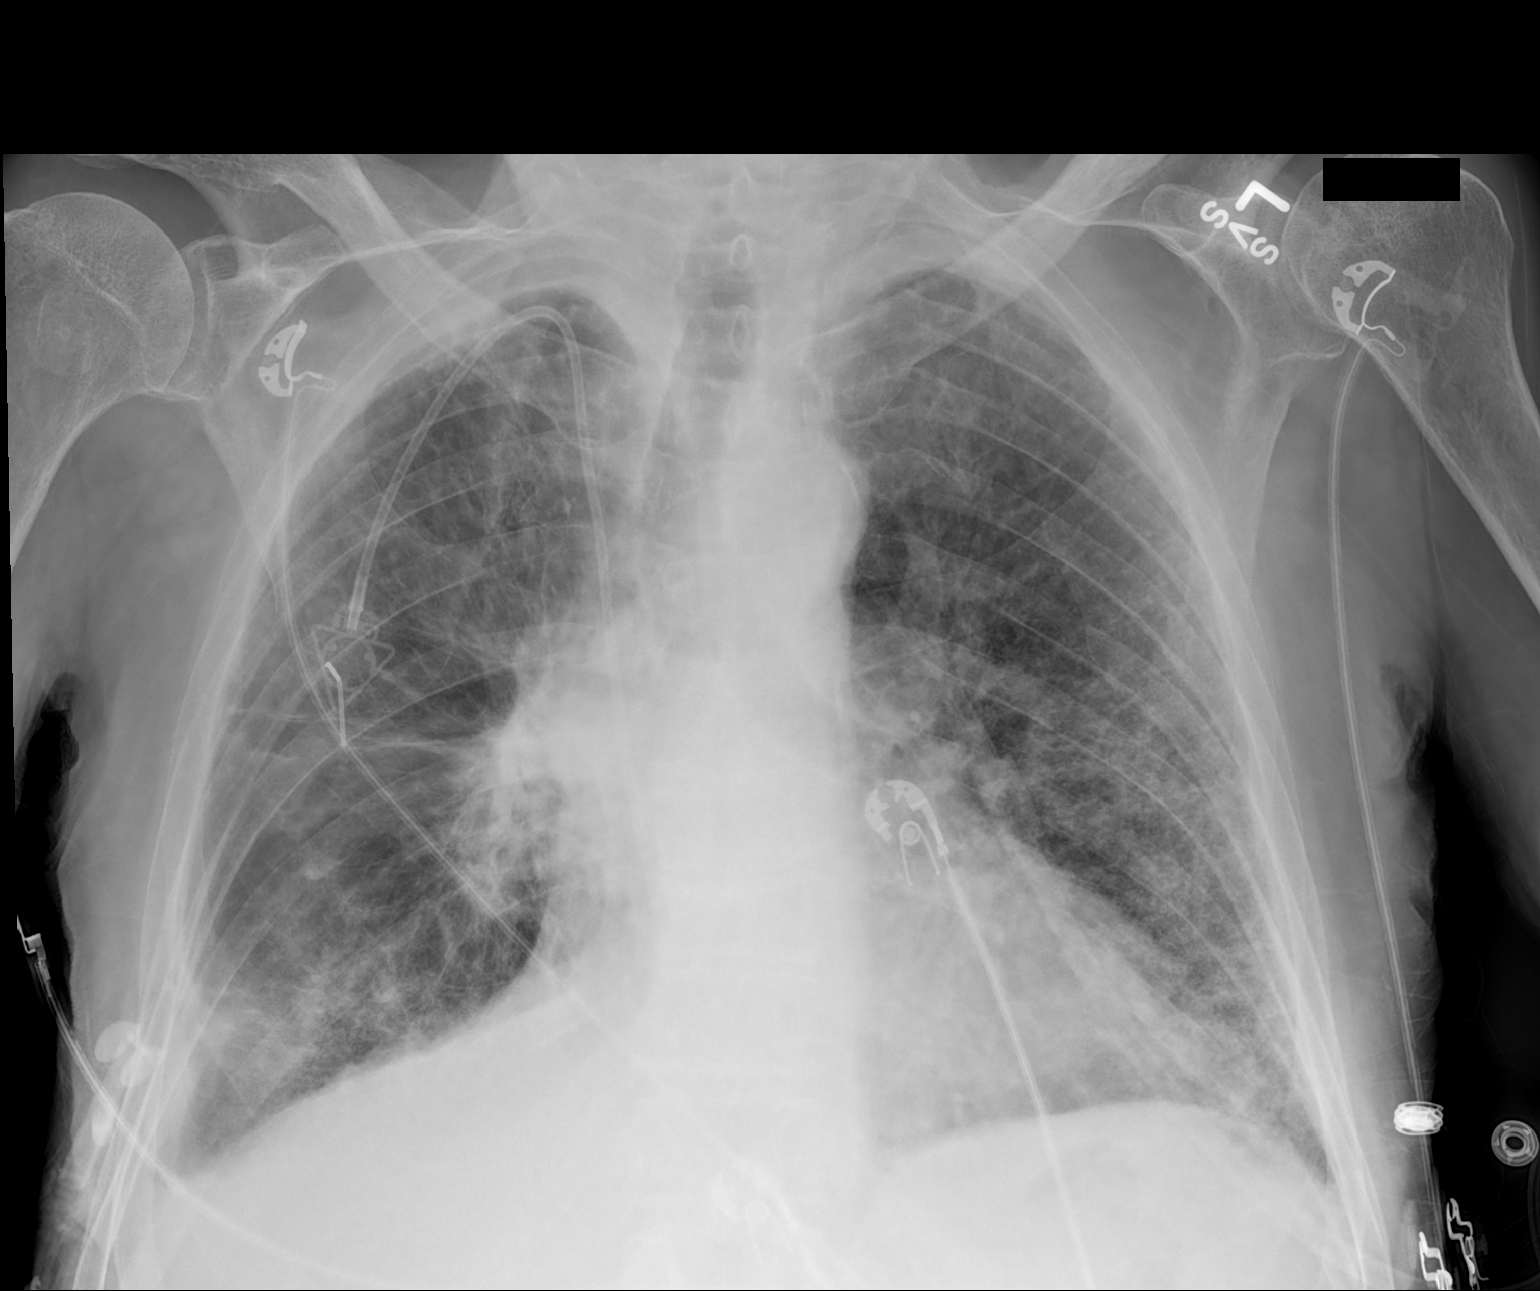

[1 of 1 positions shown; findings below may reference images not displayed]

FINDINGS: Post treatment related changes in the perihilar aspect of the right
lung are again noted, where there is a persistent mass like opacity
on today's chest x-ray, which appears slightly more prominent than
prior chest x-ray from 04/19/2014. Coarse interstitial markings are
noted throughout the lungs bilaterally. Mild diffuse bronchial wall
thickening. Small right and trace left pleural effusions. Heart size
is normal. Atherosclerosis in the thoracic aorta. Right internal
jugular single-lumen porta cath with tip terminating in the superior
aspect of the right atrium.
IMPRESSION: 1. Diffuse bronchial wall thickening and interstitial prominence
increased compared to the prior examination. Findings may reflect an
acute bronchitis, or could be related to noncardiogenic edema.
2. Post treatment related changes of prior radiation therapy in the
perihilar aspect of the right lung again noted.
3. Small right and trace left pleural effusion.
4. Atherosclerosis.

## 2016-08-17 IMAGING — CR DG CHEST 2V
2 series · 2 of 2 positions shown · non-contrast
Comparison: 06/25/2014

CLINICAL DATA: Cough. Shortness of breath and weakness. History of
right lower lobe lung cancer status post radiation and chemotherapy.

EXAM:
CHEST  2 VIEW

[w chest lat]
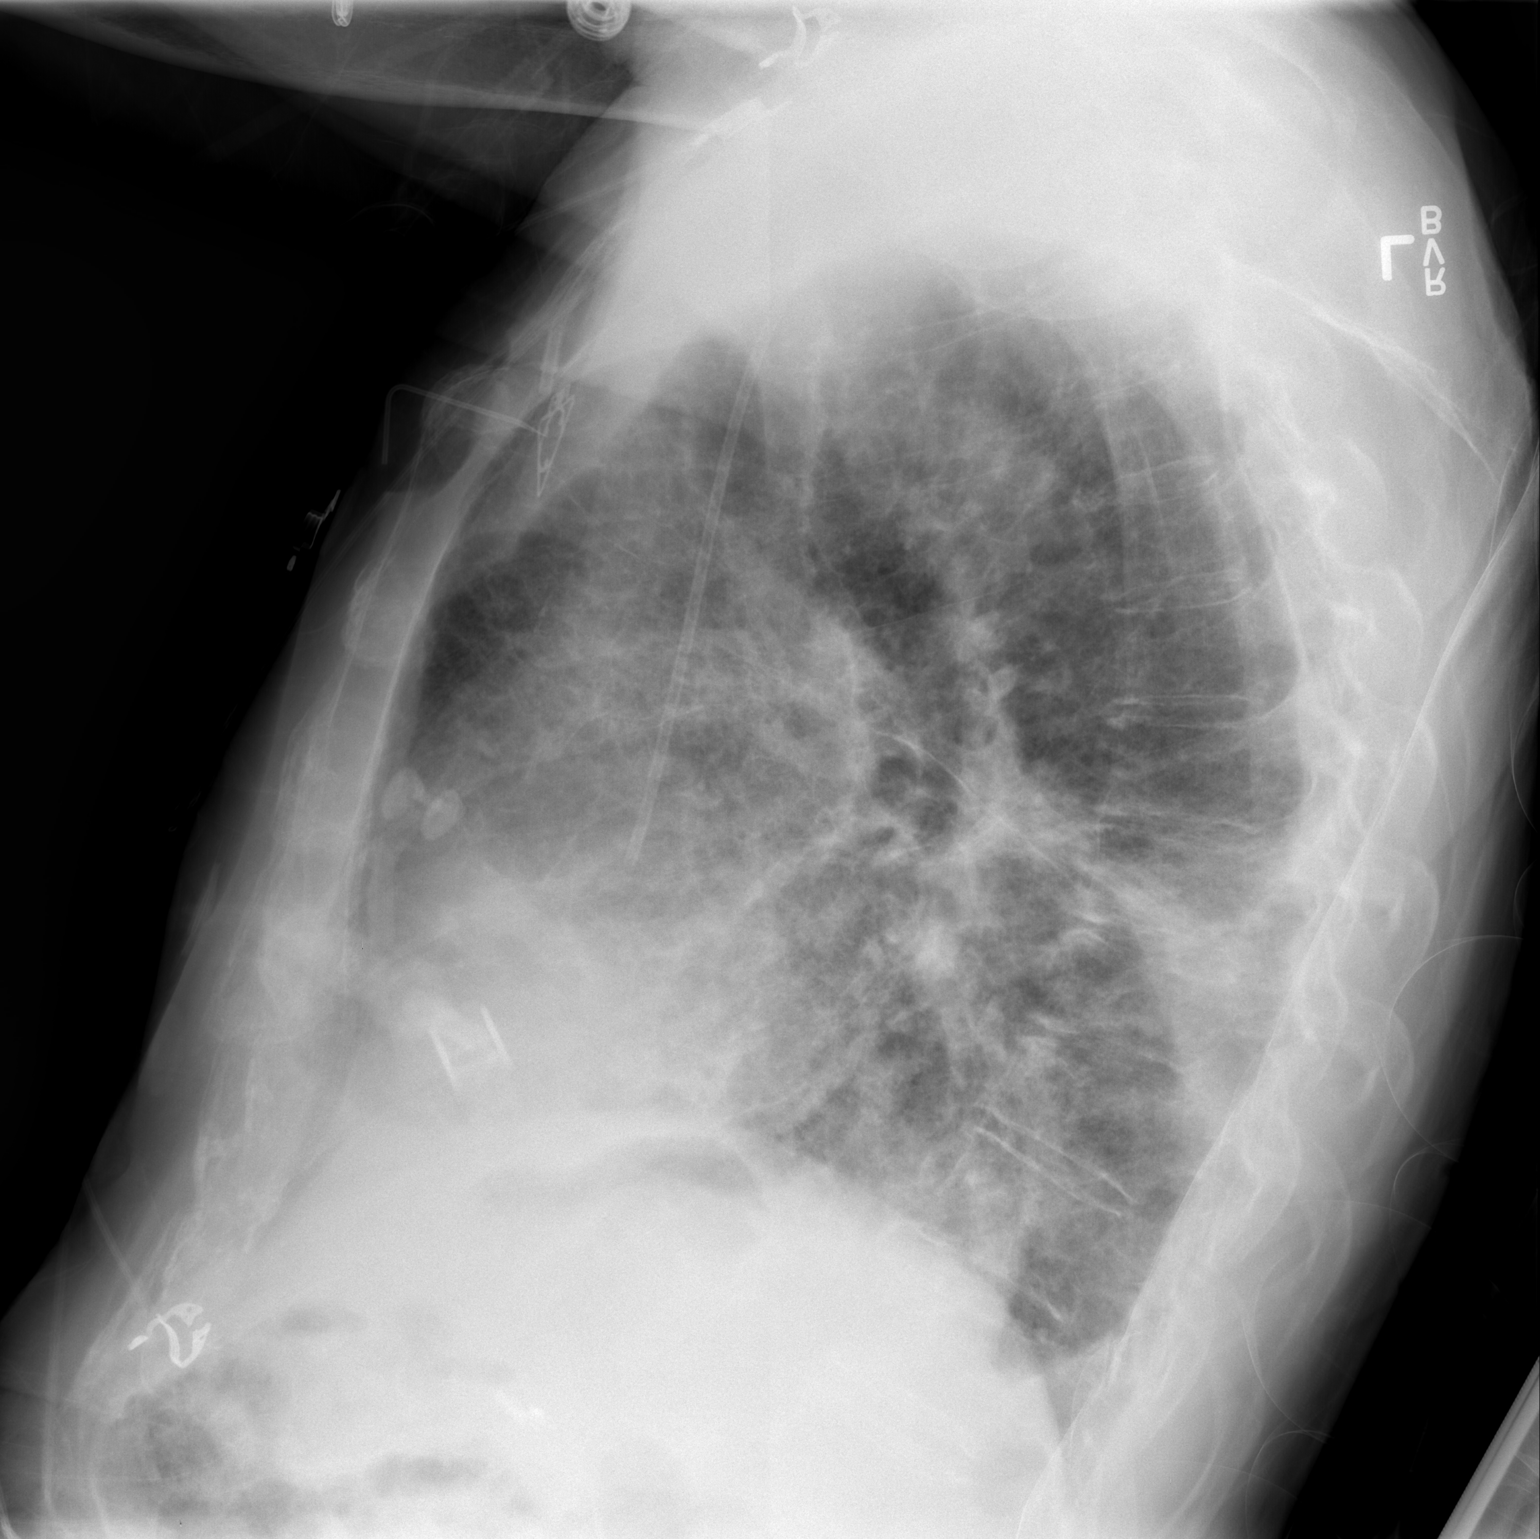

[AP]
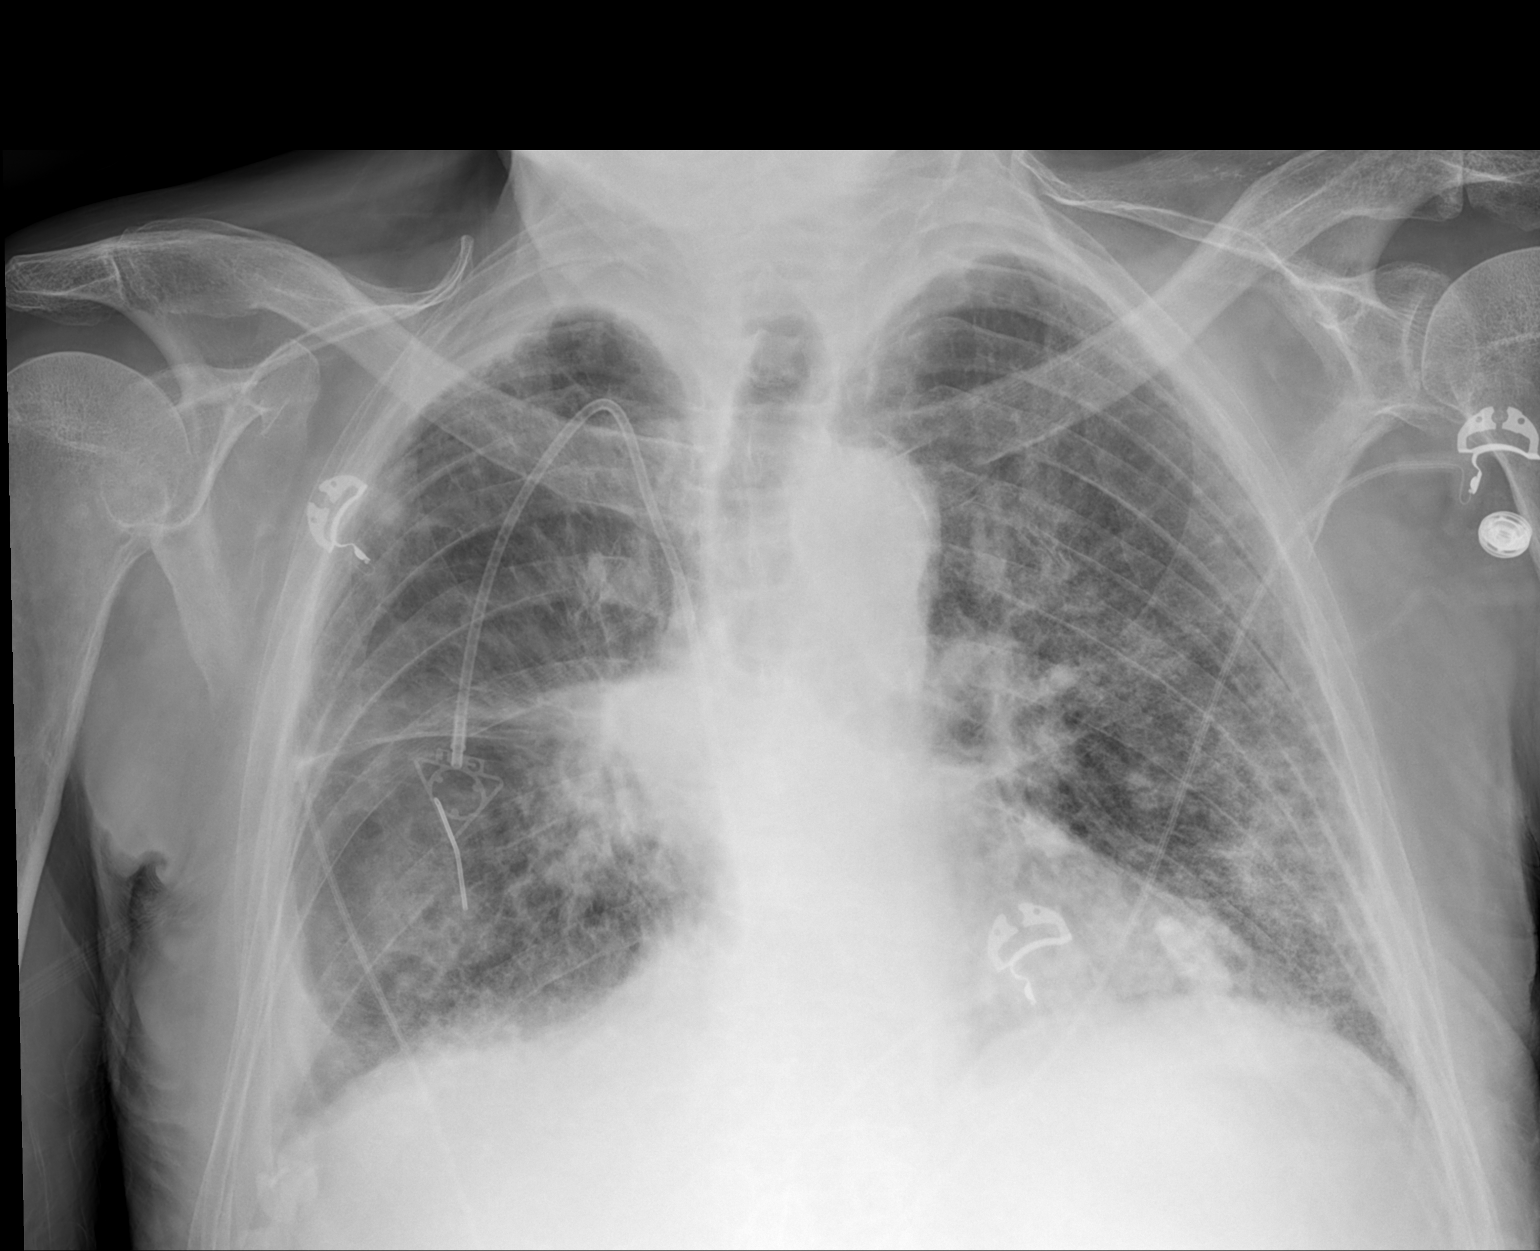

[2 of 2 positions shown; findings below may reference images not displayed]

FINDINGS: Right jugular Port-A-Cath remains in place with tip overlying the
cavoatrial junction. Cardiac silhouette is within normal limits for
size. Thoracic aortic calcification is noted. Post treatment changes
are again seen in the right hilar region with persistent masslike
opacity in architectural distortion, similar to the prior study.
Interstitial markings remain coarse diffusely, not significantly
changed. Patchy, slightly more confluent opacities in both lower
lungs appear slightly increased, likely in the right middle lobe and
lingula. A small right pleural effusion is again seen, unchanged. No
pneumothorax. No acute osseous abnormality is identified.
IMPRESSION: Similar appearance of diffuse interstitial densities, which may
reflect acute bronchitis or edema, with minimally increased patchy
opacities in both lung bases which may reflect superimposed
pneumonia. Small right pleural effusion.
